# Patient Record
Sex: Male | Born: 1950 | Race: White | Hispanic: No | Smoking: Never smoker
Health system: Southern US, Community
[De-identification: ages and names within clinical notes are randomized; demographics above are authoritative.]

## PROBLEM LIST (undated history)

## (undated) DIAGNOSIS — K219 Gastro-esophageal reflux disease without esophagitis: Secondary | ICD-10-CM

## (undated) DIAGNOSIS — D649 Anemia, unspecified: Secondary | ICD-10-CM

## (undated) DIAGNOSIS — I10 Essential (primary) hypertension: Secondary | ICD-10-CM

## (undated) DIAGNOSIS — J3800 Paralysis of vocal cords and larynx, unspecified: Secondary | ICD-10-CM

## (undated) DIAGNOSIS — F1010 Alcohol abuse, uncomplicated: Secondary | ICD-10-CM

## (undated) DIAGNOSIS — G4730 Sleep apnea, unspecified: Secondary | ICD-10-CM

## (undated) DIAGNOSIS — Z8669 Personal history of other diseases of the nervous system and sense organs: Secondary | ICD-10-CM

## (undated) DIAGNOSIS — M1990 Unspecified osteoarthritis, unspecified site: Secondary | ICD-10-CM

## (undated) DIAGNOSIS — E785 Hyperlipidemia, unspecified: Secondary | ICD-10-CM

## (undated) DIAGNOSIS — K635 Polyp of colon: Secondary | ICD-10-CM

## (undated) DIAGNOSIS — K7030 Alcoholic cirrhosis of liver without ascites: Secondary | ICD-10-CM

## (undated) DIAGNOSIS — I509 Heart failure, unspecified: Secondary | ICD-10-CM

## (undated) DIAGNOSIS — K7460 Unspecified cirrhosis of liver: Secondary | ICD-10-CM

## (undated) DIAGNOSIS — I8500 Esophageal varices without bleeding: Secondary | ICD-10-CM

## (undated) HISTORY — PX: ROTATOR CUFF REPAIR: SHX139

## (undated) HISTORY — PX: SHOULDER OPEN ROTATOR CUFF REPAIR: SHX2407

## (undated) HISTORY — PX: APPENDECTOMY: SHX54

## (undated) HISTORY — PX: HERNIA REPAIR: SHX51

## (undated) HISTORY — DX: Paralysis of vocal cords and larynx, unspecified: J38.00

## (undated) HISTORY — DX: Hyperlipidemia, unspecified: E78.5

## (undated) HISTORY — DX: Anemia, unspecified: D64.9

## (undated) HISTORY — PX: UMBILICAL HERNIA REPAIR: SHX196

## (undated) HISTORY — PX: THROAT SURGERY: SHX803

## (undated) HISTORY — DX: Unspecified osteoarthritis, unspecified site: M19.90

## (undated) HISTORY — PX: UVULOPALATOPHARYNGOPLASTY: SHX827

---

## 1999-04-15 ENCOUNTER — Encounter: Payer: Self-pay | Admitting: Emergency Medicine

## 1999-04-15 ENCOUNTER — Emergency Department (HOSPITAL_COMMUNITY): Admission: EM | Admit: 1999-04-15 | Discharge: 1999-04-15 | Payer: Self-pay | Admitting: Emergency Medicine

## 1999-05-04 HISTORY — PX: UVULOPALATOPHARYNGOPLASTY: SHX827

## 1999-08-20 ENCOUNTER — Emergency Department (HOSPITAL_COMMUNITY): Admission: EM | Admit: 1999-08-20 | Discharge: 1999-08-20 | Payer: Self-pay | Admitting: Emergency Medicine

## 1999-08-20 ENCOUNTER — Encounter: Payer: Self-pay | Admitting: Emergency Medicine

## 1999-08-21 ENCOUNTER — Encounter: Payer: Self-pay | Admitting: Emergency Medicine

## 1999-09-04 ENCOUNTER — Ambulatory Visit (HOSPITAL_COMMUNITY): Admission: RE | Admit: 1999-09-04 | Discharge: 1999-09-04 | Payer: Self-pay | Admitting: Orthopedic Surgery

## 1999-09-04 ENCOUNTER — Encounter: Payer: Self-pay | Admitting: Orthopedic Surgery

## 1999-09-15 ENCOUNTER — Ambulatory Visit (HOSPITAL_BASED_OUTPATIENT_CLINIC_OR_DEPARTMENT_OTHER): Admission: RE | Admit: 1999-09-15 | Discharge: 1999-09-16 | Payer: Self-pay | Admitting: Orthopedic Surgery

## 1999-11-21 ENCOUNTER — Ambulatory Visit (HOSPITAL_BASED_OUTPATIENT_CLINIC_OR_DEPARTMENT_OTHER): Admission: RE | Admit: 1999-11-21 | Discharge: 1999-11-21 | Payer: Self-pay | Admitting: Otolaryngology

## 1999-12-01 ENCOUNTER — Ambulatory Visit (HOSPITAL_BASED_OUTPATIENT_CLINIC_OR_DEPARTMENT_OTHER): Admission: RE | Admit: 1999-12-01 | Discharge: 1999-12-02 | Payer: Self-pay | Admitting: Orthopedic Surgery

## 2000-01-21 ENCOUNTER — Encounter (INDEPENDENT_AMBULATORY_CARE_PROVIDER_SITE_OTHER): Payer: Self-pay | Admitting: Specialist

## 2000-01-21 ENCOUNTER — Ambulatory Visit (HOSPITAL_COMMUNITY): Admission: RE | Admit: 2000-01-21 | Discharge: 2000-01-22 | Payer: Self-pay | Admitting: Otolaryngology

## 2000-04-15 ENCOUNTER — Ambulatory Visit (HOSPITAL_BASED_OUTPATIENT_CLINIC_OR_DEPARTMENT_OTHER): Admission: RE | Admit: 2000-04-15 | Discharge: 2000-04-15 | Payer: Self-pay | Admitting: Otolaryngology

## 2000-11-09 ENCOUNTER — Encounter: Payer: Self-pay | Admitting: Orthopedic Surgery

## 2000-11-09 ENCOUNTER — Encounter: Admission: RE | Admit: 2000-11-09 | Discharge: 2000-11-09 | Payer: Self-pay | Admitting: Orthopedic Surgery

## 2003-02-15 ENCOUNTER — Inpatient Hospital Stay (HOSPITAL_COMMUNITY): Admission: EM | Admit: 2003-02-15 | Discharge: 2003-02-19 | Payer: Self-pay | Admitting: Emergency Medicine

## 2003-02-15 ENCOUNTER — Encounter: Payer: Self-pay | Admitting: Emergency Medicine

## 2003-02-16 ENCOUNTER — Encounter: Payer: Self-pay | Admitting: *Deleted

## 2003-02-18 ENCOUNTER — Encounter (INDEPENDENT_AMBULATORY_CARE_PROVIDER_SITE_OTHER): Payer: Self-pay | Admitting: *Deleted

## 2003-02-18 ENCOUNTER — Encounter: Payer: Self-pay | Admitting: *Deleted

## 2004-05-13 ENCOUNTER — Ambulatory Visit: Payer: Self-pay | Admitting: Gastroenterology

## 2004-05-20 ENCOUNTER — Ambulatory Visit: Payer: Self-pay | Admitting: Gastroenterology

## 2009-05-07 ENCOUNTER — Encounter (INDEPENDENT_AMBULATORY_CARE_PROVIDER_SITE_OTHER): Payer: Self-pay | Admitting: *Deleted

## 2009-12-02 ENCOUNTER — Telehealth: Payer: Self-pay | Admitting: Gastroenterology

## 2009-12-12 ENCOUNTER — Ambulatory Visit: Payer: Self-pay | Admitting: Cardiology

## 2009-12-12 ENCOUNTER — Inpatient Hospital Stay (HOSPITAL_COMMUNITY): Admission: EM | Admit: 2009-12-12 | Discharge: 2009-12-16 | Payer: Self-pay | Admitting: Emergency Medicine

## 2009-12-14 ENCOUNTER — Encounter: Payer: Self-pay | Admitting: Cardiology

## 2010-01-28 ENCOUNTER — Ambulatory Visit: Payer: Self-pay | Admitting: Cardiology

## 2010-06-04 NOTE — Letter (Signed)
Summary: Colonoscopy Letter  [facility]  [address] [place], [place] [address]   Phone: [phone]  Fax: [phone]      May 07, 2009 MRN: [id number]   [name] [name] [address] [place], [place]  [address]   Dear [name],   According to your medical record, it is time for you to schedule a Colonoscopy. The American Cancer Society recommends this procedure as a method to detect early colon cancer. Patients with a family history of colon cancer, or a personal history of colon polyps or inflammatory bowel disease are at increased risk.  This letter has beeen generated based on the recommendations made at the time of your procedure. If you feel that in your particular situation this may no longer apply, please contact our office.  Please call our office at [phone] to schedule this appointment or to update your records at your earliest convenience.  Thank you for cooperating with [place] to provide you with the very best care possible.   Sincerely,  Barbette Hair. Arlyce Dice, M.D.  [facility] [phone]

## 2010-06-04 NOTE — Progress Notes (Signed)
Summary: Schedule Colonoscopy  Phone Note Outgoing Call Call back at Home Phone [phone]   Call placed by: Harlow Mares CMA Duncan Dull),  December 02, 2009 4:56 PM Call placed to: Patient Summary of Call: Patients number is disconnected, we will mail them a letter to remind them they are due for their procedure and they need to call back and schedule.   Initial call taken by: Harlow Mares CMA (AAMA),  December 02, 2009 4:56 PM

## 2010-07-16 LAB — BASIC METABOLIC PANEL
BUN: 16 mg/dL (ref 6–23)
CO2: 29 mEq/L (ref 19–32)
Calcium: 9 mg/dL (ref 8.4–10.5)
Chloride: 99 mEq/L (ref 96–112)
Creatinine, Ser: 0.9 mg/dL (ref 0.4–1.5)
GFR calc non Af Amer: >60 mL/min (ref >60)
Potassium: 4 mEq/L (ref 3.5–5.1)

## 2010-07-16 LAB — CBC
HCT: 44.4 % (ref 39.0–52.0)
Hemoglobin: 15.1 g/dL (ref 13.0–17.0)
MCH: 33 pg (ref 26.0–34.0)
RBC: 4.58 MIL/uL (ref 4.22–5.81)
WBC: 8.4 10*3/uL (ref 4.0–10.5)

## 2010-07-17 LAB — CBC
HCT: 41.5 % (ref 39.0–52.0)
Hemoglobin: 14.1 g/dL (ref 13.0–17.0)
MCH: 32.5 pg (ref 26.0–34.0)
MCHC: 34.5 g/dL (ref 30.0–36.0)
Platelets: 274 10*3/uL (ref 150–400)
WBC: 10.5 10*3/uL (ref 4.0–10.5)
WBC: 7.5 10*3/uL (ref 4.0–10.5)

## 2010-07-17 LAB — URINALYSIS, ROUTINE W REFLEX MICROSCOPIC
Hgb urine dipstick: NEGATIVE
Ketones, ur: NEGATIVE mg/dL
Nitrite: NEGATIVE
Protein, ur: NEGATIVE mg/dL
Specific Gravity, Urine: 1.006 (ref 1.005–1.030)

## 2010-07-17 LAB — PROTIME-INR
INR: 0.86 (ref 0.00–1.49)
Prothrombin Time: 11.9 seconds (ref 11.6–15.2)

## 2010-07-17 LAB — COMPREHENSIVE METABOLIC PANEL
ALT: 61 U/L — ABNORMAL HIGH (ref 0–53)
ALT: 82 U/L — ABNORMAL HIGH (ref 0–53)
AST: 107 U/L — ABNORMAL HIGH (ref 0–37)
Albumin: 3.1 g/dL — ABNORMAL LOW (ref 3.5–5.2)
Alkaline Phosphatase: 84 U/L (ref 39–117)
BUN: 8 mg/dL (ref 6–23)
BUN: 8 mg/dL (ref 6–23)
CO2: 26 mEq/L (ref 19–32)
Calcium: 8.5 mg/dL (ref 8.4–10.5)
Calcium: 8.5 mg/dL (ref 8.4–10.5)
Chloride: 96 mEq/L (ref 96–112)
Chloride: 97 mEq/L (ref 96–112)
Creatinine, Ser: 0.74 mg/dL (ref 0.4–1.5)
Creatinine, Ser: 0.77 mg/dL (ref 0.4–1.5)
GFR calc Af Amer: >60 mL/min (ref >60)
GFR calc Af Amer: >60 mL/min (ref >60)
GFR calc non Af Amer: >60 mL/min (ref >60)
GFR calc non Af Amer: >60 mL/min (ref >60)
GFR calc non Af Amer: >60 mL/min (ref >60)
Glucose, Bld: 109 mg/dL — ABNORMAL HIGH (ref 70–99)
Potassium: 3.9 mEq/L (ref 3.5–5.1)
Sodium: 138 mEq/L (ref 135–145)
Total Bilirubin: 0.7 mg/dL (ref 0.3–1.2)
Total Bilirubin: 1.3 mg/dL — ABNORMAL HIGH (ref 0.3–1.2)

## 2010-07-17 LAB — BRAIN NATRIURETIC PEPTIDE: Pro B Natriuretic peptide (BNP): 448 pg/mL — ABNORMAL HIGH (ref 0.0–100.0)

## 2010-07-17 LAB — LIPID PANEL
HDL: 44 mg/dL (ref >39)
LDL Cholesterol: 169 mg/dL — ABNORMAL HIGH (ref 0–99)
Total CHOL/HDL Ratio: 5.9 RATIO

## 2010-07-17 LAB — DIFFERENTIAL
Basophils Relative: 1 % (ref 0–1)
Eosinophils Absolute: 0.3 10*3/uL (ref 0.0–0.7)
Eosinophils Relative: 4 % (ref 0–5)
Lymphs Abs: 1.5 10*3/uL (ref 0.7–4.0)
Monocytes Absolute: 0.6 10*3/uL (ref 0.1–1.0)
Monocytes Relative: 9 % (ref 3–12)
Neutro Abs: 5 10*3/uL (ref 1.7–7.7)
Neutrophils Relative %: 66 % (ref 43–77)

## 2010-07-17 LAB — HEMOGLOBIN A1C: Mean Plasma Glucose: 131 mg/dL — ABNORMAL HIGH (ref <117)

## 2010-07-17 LAB — MRSA PCR SCREENING: MRSA by PCR: NEGATIVE

## 2010-07-17 LAB — TSH: TSH: 2.903 u[IU]/mL (ref 0.350–4.500)

## 2010-07-17 LAB — CARDIAC PANEL(CRET KIN+CKTOT+MB+TROPI)
Total CK: 58 U/L (ref 7–232)
Troponin I: 0.05 ng/mL (ref 0.00–0.06)

## 2010-07-17 LAB — POCT CARDIAC MARKERS: Troponin i, poc: <0.05 ng/mL (ref 0.00–0.09)

## 2010-07-17 LAB — URIC ACID: Uric Acid, Serum: 5.3 mg/dL (ref 4.0–7.8)

## 2010-07-17 LAB — ETHANOL: Alcohol, Ethyl (B): 185 mg/dL — ABNORMAL HIGH (ref 0–10)

## 2010-08-14 ENCOUNTER — Emergency Department (HOSPITAL_COMMUNITY)
Admission: EM | Admit: 2010-08-14 | Discharge: 2010-08-14 | Disposition: A | Discharge status: Home / Self Care | Payer: 59 | Attending: Emergency Medicine | Admitting: Emergency Medicine

## 2010-08-14 DIAGNOSIS — IMO0002 Reserved for concepts with insufficient information to code with codable children: Secondary | ICD-10-CM | POA: Insufficient documentation

## 2010-08-14 DIAGNOSIS — I10 Essential (primary) hypertension: Secondary | ICD-10-CM | POA: Insufficient documentation

## 2010-08-14 DIAGNOSIS — S01502A Unspecified open wound of oral cavity, initial encounter: Secondary | ICD-10-CM | POA: Insufficient documentation

## 2010-09-18 NOTE — Op Note (Signed)
[place]  Patient:    [name] [name], [name] [name]                        MRN: [id number] Proc. Date: 01/21/00 Adm. Date:  [id number] Disc. Date: [id number] Attending:  Barbee Cough                           Operative Report  PREOPERATIVE DIAGNOSES: 1. Severe obstructive sleep apnea. 2. Nasal septal deviation. 3. Tonsillar hypertrophy. 4. Inferior nasal turbinate hypertrophy.  POSTOPERATIVE DIAGNOSES: 1. Severe obstructive sleep apnea. 2. Nasal septal deviation. 3. Tonsillar hypertrophy. 4. Inferior nasal turbinate hypertrophy.  INDICATIONS: 1. Severe obstructive sleep apnea. 2. Nasal septal deviation. 3. Tonsillar hypertrophy. 4. Inferior nasal turbinate hypertrophy.  SURGICAL PROCEDURE: 1. Uvulopalatopharyngoplasty. 2. Nasal septoplasty. 3. Tonsillectomy. 4. Inferior turbinate intramural cautery.  ANESTHESIA:  General endotracheal.  ESTIMATED BLOOD LOSS:  Less 100 cc.  COMPLICATIONS:  None.  Patient was transferred from the operating room to the recovery room in stable condition.  BRIEF HISTORY:  [name] is a 60 year old white male, who is referred for evaluation of snoring and obstructive sleep apnea.  Inpatient sleep study was performed at the [facility] and the patient was found to have an RDI of 80 events per hour with an O2 nadir of 74%.  The patient was tried on nasal CPAP and unfortunately secondary to chronic nasal obstruction and pressure sensation was unable to tolerate CPAP on an ongoing basis.  Given the patients history, examination and findings on flexible nasolaryngoscopy, I have recommended that we undertake surgical management of his obstructive sleep apnea.  Prior to surgery, informed consent was obtained from the patient and his wife for the above procedures, which include nasal septoplasty, uvulopalatopharyngoplasty, tonsillectomy and inferior turbinate intramural cautery.  PROCEDURE:  The  patient was brought to the operating room on January 21, 2000 and placed in a supine position on the operating table. General endotracheal anesthesia was established without difficulty.  When the patient had been adequately anesthetized, he was injected with 10 cc of 1% lidocaine, 1:100,000 dilution epinephrine injected in submucosal fashion along the nasal septum and inferior turbinates.  Patients nose was then packed with Afrin-soaked cottonoid pledgets, which were left in place for approximately 10 minutes to allow for vasoconstriction.  Patient was then prepped and draped in a sterile fashion and the surgical procedure was begun.  Nasal surgery consisted of nasal septoplasty and turbinate reduction and a right anterior hemitransfixion incision was created using a #15 scalpel blade, this was carried through the mucosa, underlying submucosa and a mucoperichondrial flap was elevated from anterior to posterior along the patients right hand side. The mucoperiosteal junction was identified and crossed at the midline and the mucoperiosteal flap was elevated along the patients left nasal septum.  With the mucosal flaps elevated, deviated bone and cartilage from the mid and posterior aspects of the septum were resected.  The patient had a large inferior septal spur along the left hand side, which was also resected using a 4 mm osteotome.  When septal cartilage had been removed and the nasal septum was midline, mucosal flaps were reapproximated using a 4-0 gut suture on a Keith needle in a horizontal mattressing fashion.  Bilateral Doyle nasal septal splints were placed after application of Bactroban ointment and these were sutured into position with a 3-0 Ethilon stitch.  Inferior turbinates were then cauterized with  cautery set at 12 watts.  Two passes were made in a submucosal fashion in each inferior turbinate.  When the turbinates were adequately cauterized they were out-fractured to create  a more patent nasal cavity.  Attention was then turned to the oral cavity and oropharynx.  A Crowe-Davis mouthgag was inserted without difficulty.  There were no loose or broken teeth and the hard and soft palate were intact.  Patients oral cavity was obstructed secondary to tonsillar hypertrophy and uvulopalatal hypertrophy.  Tonsillectomy was performed using the Harmonic scalpel dissecting in a subcapsular fashion and removing the entire left tonsil from superior pole to tongue base.  The right tonsil was removed in a similar fashion.  There was no significant bleeding.  Tonsillar fossae were inspected and gently abraded with a dry tonsil sponge and electrocautery was used to maintain hemostasis.  A uvulopalatopharyngoplasty was then performed estimating the posterior extent of the soft palate with ______ with the posterior pharyngeal wall.  A strip of tissue measuring approximately 1 to 1.5 cm was resected along the anterior tonsillar pillars, including uvulopalatal musculature and mucosa.  The incision was bevelled from anterior to posterior in order to create a posterior mucosal flap.  This procedure was performed using the Harmonic scalpel.  When the tissue had been resected, tonsil tissue was sent to pathology for gross and microscopic evaluation.  The posterior tonsillar pillars were left intact throughout the procedure and were reapproximated anteriorly using a 3-0 chromic suture on a taper needle in a horizontal mattressing fashion.  The entire uvulopalatopharyngoplasty incision was then closed in an interrupted fashion.  There was no active bleeding. Patients oral cavity and oropharynx were thoroughly irrigated and suctioned and an oral gastric tube was passed.  Patients nasal cavity and nasopharynx were irrigated, Crowe-Davis mouthgag was released and reapplied and again no bleeding was noted.  The mouthgag was removed.  Patient was then awakened from his anesthetic, extubated  without difficulty and was transferred from the operating room to the recovery room in stable condition. DD:  01/21/00 TD:  01/22/00 Job: [id number]

## 2010-09-18 NOTE — Discharge Summary (Signed)
NAMEJEFFREE, [name]                           ACCOUNT NO.:  [id number]   MEDICAL RECORD NO.:  [id number]                   PATIENT TYPE:  INP   LOCATION:  5511                                 FACILITY:  [facility]   PHYSICIAN:  Alfonse Spruce, M.D.               DATE OF BIRTH:  [birth year]   DATE OF ADMISSION:  02/15/2003  DATE OF DISCHARGE:  02/19/2003                                 DISCHARGE SUMMARY   FINAL DIAGNOSES:  1. Acute chest pain syndrome on admission associated with shortness of     breath and unstable angina.  2. Nonobstructive coronary artery disease.  3. Moderate left ventricular dysfunction with global hypokinesia, etiology     undetermined: Alcohol versus hypertension versus sleep apnea.  Consider     probably the hypertension will be the main cause at this time.  4. History of gastroesophageal reflux disease with hiatal hernia.  5. Anxiety.  6. Hypertension.  7. Cardiovascular  disease.  8. Hyperlipidemia.  9. Ejection fraction 35%  10.      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   DISCHARGE MEDICATIONS:  1. ASA, enteric coated, 325 mg daily.  2. Zocor 40 mg h.s.  3. Hydrochlorothiazide 12.5 mg daily.  4. Prilosec 20 mg daily.  5. Altace 5 mg b.i.d.  6. Lopressor 75 mg b.i.d.   ACTIVITY:  No lifting   DIET:  Low-fat, low-salt, low-cholesterol diet.   WOUND CARE:  Pressure on drainage site on right groin.   FOLLOW UP:  Follow up with Dr. Margrett Rud on November 2 at 10 a.m. and  also follow up with Dr. Antoine Poche November 1 at 3 p.m. for cardiology, and  follow up on November 30 at 1:30 p.m. for the right groin.  Repeat  ultrasound at Dr. Jenene Slicker office.   HOSPITAL COURSE:  The patient was initially admitted by Dr. Donald Siva,  [facility], and subsequently was admitted October 15.  Subsequently seen by myself on October 16 and 17 and 18.  At that time, the  patient had hypertensive cardiovascular disease that was  uncontrolled with  questionable underlying renal artery stenosis and decreased ejection  fraction by echocardiogram.  At that time, evaluation by cardiology by  cardiac catheterization was obtained which was done on 10 October and  followed by Dr. Angelena Sole subsequently in my absence.  On October 19, seen by  Dr. Angelena Sole and discharged home subsequently.  The patient was stable on  discharge.  He had ultrasound of the groin prior to discharge with the  underlying right groin fistula, covered with pressure bandage, and follow up  with Dr. Antoine Poche.  The patient was also seen by Dr. Olga Millers during  hospital stay wit the underlying congestive heart failure and dyspnea,  hypertension, hyperlipidemia, GERD, and sleep apnea.   Cardiac catheterization found that coronary artery left main is normal, LAD  proximal 40% stenosis, and  mid 25% stenosis; circumflex was large vessel and  dominant, and there was a mid marginal which appeared to have ostial 30%  stenosis.  Right coronary vessel was small and non-dominant.  The left  ventriculogram showed ejection fraction of 30% with global hypokinesis.  The  aortogram showed normal renal arteries and minimal diffuse distal aortic  plaquing.   The patient has been discharged home and will be followed closely by Dr.  Margrett Rud as well as Dr. Antoine Poche as an outpatient with the above  medications.                                                Alfonse Spruce, M.D.    Wynn Maudlin  D:  04/04/2003  T:  04/04/2003  Job:  [id number]
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
## 2010-09-18 NOTE — Discharge Summary (Signed)
[place]. [facility]  Patient:    [name] [name], [name] [name]                        MRN: [id number] Adm. Date:  [id number] Disc. Date: [id number] Attending:  Barbee Cough                           Discharge Summary  ADMISSION DIAGNOSES: 1. Severe obstructive sleep apnea. 2. Nasal septal deviation with obstruction. 3. Uvula palatal hypertrophy. 4. Tonsil hypertrophy.  DISCHARGE DIAGNOSES: 1. Severe obstructive sleep apnea. 2. Nasal septal deviation with obstruction. 3. Uvula palatal hypertrophy. 4. Tonsil hypertrophy.  SURGICAL PROCEDURES THIS ADMISSION DATED January 21, 2000: 1. Uvulopalatopharyngoplasty. 2. Nasal septoplasty. 3. Tonsillectomy. 4. Inferior turbinate intramural cautery.  DISPOSITION:  Patient is discharged to home in stable condition in the company of his family.  DISCHARGE MEDICATIONS:  Include his preoperative medication Prilosec 20 mg q.h.s. and discharge medications of Lortab elixir two to three teaspoons every four to six hours as needed for pain management 300 cc (dispense 300 cc), Cipro 500 mg p.o. b.i.d., and Vioxx 50 mg p.o. q.d.  ACTIVITY:  Limited.  No lifting, straining, or nose blowing.  DIET:  Clear liquids and soft diet as tolerated.  WOUND CARE:  Saline nasal spray four sprays per nostril every hour while awake.  FOLLOW-UP:  Patient will follow up in my office on January 28, 2000 as scheduled or sooner as warranted by problems or symptoms.  HISTORY OF PRESENT ILLNESS:  [name] is a 60 year old white male who was referred for evaluation of obstructive sleep apnea.  Patients sleep study performed at the [facility] showed a significant RDI of approximately 80 events per hour and an O2 nadir of 73%.  The patient was intolerant of nasal CPAP secondary to chronic nasal obstruction.  Given his history and findings, he was referred for surgical evaluation.  Given the patients above findings and examination using  diagnostic nasal laryngoscopy, I recommended we undertake surgical procedures including uvulopalatopharyngoplasty, nasal septoplasty, tonsillectomy, and turbinate cautery.  The risks, benefits, and possible complications of each of these surgical procedures was discussed in detail with the patient and his wife who understood and approved our plan for surgery.  Patient was admitted to [facility] for the above surgical procedure.  HOSPITAL COURSE:  Patient admitted on January 21, 2000 to [facility] and taken to the main operating room where he underwent uvulopalatopharyngoplasty, nasal septoplasty, tonsillectomy, and inferior turbinate cautery under general anesthesia.  The patients surgery was uncomplicated and uneventful and he was transferred from the operating room to recovery in stable condition in the recovery unit 2100 for postoperative monitoring.  Given the patients severe sleep apnea, continuous cardiac and pulse oximetry monitoring was performed in the first postoperative night.  The patient did well in the postoperative period.  His postoperative O2 nadir was 94% on humidified room air.  He was tolerating liquids, soft oral diet without difficulty, bowel and bladder function were normal, and the patient was ambulating without difficulty.  He was discharged to home with the above discharge instructions in stable condition and will follow up in my office as noted above. DD:  01/22/00 TD:  01/23/00 Job: [id number]

## 2010-09-18 NOTE — Cardiovascular Report (Signed)
[name] [name], [name] [name]                           ACCOUNT NO.:  [id number]   MEDICAL RECORD NO.:  [id number]                   PATIENT TYPE:  INP   LOCATION:  5511                                 FACILITY:  [facility]   PHYSICIAN:  Rollene Rotunda, M.D.                DATE OF BIRTH:  Jul 19, 1950   DATE OF PROCEDURE:  02/18/2003  DATE OF DISCHARGE:                              CARDIAC CATHETERIZATION   PROCEDURE:  1. Left and right heart catheterization.  2. Coronary arteriography.   INDICATIONS:  The patient with acute heart failure (428.21).   PROCEDURAL NOTE:  Left heart catheterization performed via the right femoral  artery.  Right heart catheterization performed via the right femoral vein.  Both vessels were cannulated using anterior wall puncture.  A #6-French  arterial sheath was inserted via the modified Seldinger technique.  Preformed Judkins and a pigtail catheter were utilized.  The patient  tolerated the procedure well and left the laboratory in stable condition.   RESULTS:  HEMODYNAMICS:  LV 179/9.  AO 179/101.  RA mean 12.  RV 35/10.  PA 34/15, mean 24.  Pulmonary capillary wedge pressure mean 21.  Cardiac output/cardiac index  (Fick) 3.5/1.72.   CORONARIES:  The left main was normal.   The LAD had proximal 40% stenosis and mid 25% stenosis.   The circumflex was a large vessel and dominant.  In the AV groove it was  normal.  There was a mid obtuse marginal which appeared to have an ostial  30% stenosis.  It was a large vessel.  The right coronary artery was small  and nondominant.   LEFT VENTRICULOGRAM:  The left ventriculogram was obtained in the RAO  projection.  EF of 30% with global hypokinesis.   AORTOGRAM:  A distal aortogram was obtained which demonstrated normal renal  arteries.  There was minimal diffuse distal aortic plaquing.   CONCLUSION:  1. Nonobstructive coronary artery disease.  2. Moderate left ventricular dysfunction with global hypokinesis.   The     etiology may be alcohol versus sleep apnea versus hypertension.    PLAN:  The patient will continue to have medical management.  We discussed  abstaining from alcohol.  I also think it is important for treatment of his  hypertension and his cardiomyopathy to continue to have aggressive attempts  to get him to wear his CPAP.   Stanley C. Andrey Campanile, M.D. not called secondary to this being a late case.                                               Rollene Rotunda, M.D.    JH/MEDQ  D:  02/18/2003  T:  02/19/2003  Job:  [id number]   cc:   Vale Haven. Andrey Campanile, M.D.  [address]  [address]  [place]  [place] [address]  Fax: [phone]

## 2010-09-18 NOTE — Discharge Summary (Signed)
NAMEZEUS, MARQUIS                           ACCOUNT NO.:  [id number]   MEDICAL RECORD NO.:  [id number]                   PATIENT TYPE:  INP   LOCATION:  5511                                 FACILITY:  [facility]   PHYSICIAN:  Alfonse Spruce, M.D.               DATE OF BIRTH:  1950-07-15   DATE OF ADMISSION:  02/15/2003  DATE OF DISCHARGE:  02/19/2003                                 DISCHARGE SUMMARY   ADDENDUM:  Patient was discharged on cardiac diet and activity to be no  lifting as mentioned above and with the diet as mentioned low cholesterol,  low salt, low fat.  Prognosis is fair to guarded.  Continue the above  medications to be adjusted per Dr. Margrett Rud the PCP and cardiology Dr.  Antoine Poche.                                                Alfonse Spruce, M.D.    Wynn Maudlin  D:  04/04/2003  T:  04/04/2003  Job:  [id number]
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
## 2010-09-18 NOTE — Consult Note (Signed)
[name] [name], [name] [name]                           ACCOUNT NO.:  [id number]   MEDICAL RECORD NO.:  [id number]                   PATIENT TYPE:  INP   LOCATION:  1827                                 FACILITY:  [facility]   PHYSICIAN:  Olga Millers, M.D.                DATE OF BIRTH:  June 23, 1950   DATE OF CONSULTATION:  02/15/2003  DATE OF DISCHARGE:                                   CONSULTATION   REFERRING PHYSICIAN:  Dr. Norwood Levo.   REASON FOR CONSULTATION:  [name] [name] is a 60 year old male with a past  medical history of sleep apnea, hypertension, hyperlipidemia, arthritis and  gastroesophageal reflux disease, whom I am ask to evaluate for dyspnea.  The  patient has no prior cardiac history.  He has had sleep apnea for  approximately seven to eight years.  Over the past several months, he has  noticed progressive dyspnea on exertion.  He initially noticed this with  more extreme activities.  However, now it is limiting his routine  activities.  There is no dyspnea at rest.  There is orthopnea and occasional  PND but there is no pedal edema.  He has had chest tightness at times, but  this increases with lying flat and improves with sitting up.  It is not  pleuritic, nor it is exertional.  There are no fevers, chills or productive  cough and there is no hemoptysis.  He was concerned about these symptoms and  came to the emergency room this evening for evaluation.  We were asked to  further evaluate after he was admitted by a primary care.   ALLERGIES:  He is allergic to ERYTHROMYCIN, CLINDAMYCIN, CEPHALOSPORINS and  TAPE.   MEDICATIONS:  His medications include history of Vioxx and Prilosec and  Cialis.   SOCIAL HISTORY:  He has a remote history of tobacco use but none in the past  eight years.  He does occasionally consume alcohol.   FAMILY HISTORY:  His family history is positive for coronary artery disease  in his father, who has had coronary artery bypassing graft.   PAST MEDICAL HISTORY:  His past medical history is significant for  hypertension and hyperlipidemia, but there is no diabetes mellitus.  He does  have a history of sleep apnea and has had surgery for that issue.  He has  had no prior cardiac history.  He has had prior rotator cuff surgery x2 on  the right and once on the left.  He has also had prior appendectomy and  tonsillectomy.  He does have sleep apnea and a history of gastroesophageal  reflux disease as well as arthritis.   REVIEW OF SYSTEMS:  He denies any headaches or fevers or chills.  There is  no productive cough or hemoptysis.  There is no dysphagia, odynophagia,  melena or hematochezia.  There is no dysuria or hematuria.  There is no  rash  or seizure activity.  There is orthopnea and PND but there is no pedal  edema.  There is no claudication noted.   PHYSICAL EXAMINATION:  VITAL SIGNS:  His physical exam today shows a blood  pressure of 141/93 and his pulse is 85.  He is afebrile.  GENERAL:  He is well-developed and obese.  He is in no acute distress.  He  does not appear to be depressed.  There is no peripheral clubbing.  HEENT:  His HEENT are unremarkable with normal eyelids.  NECK:  His neck is supple with normal upstroke bilaterally and there are no  bruits noted.  There is no jugular venous distention and no thyromegaly  noted.  CHEST:  His chest is clear to auscultation with normal expansion.  CARDIOVASCULAR:  Exam shows a regular rate and rhythm with a normal S1 and  S2.  There are no murmurs, rubs, or gallops noted.  ABDOMEN:  Not tender or distended.  Positive bowel sounds.  No  hepatosplenomegaly and no masses appreciated.  There is no abdominal bruit.  He has 2+ femoral pulses bilaterally with no bruits.  EXTREMITIES:  His extremities show no edema and I can palpate no cords.  He  has 2+ dorsalis pedis pulses bilaterally.  NEUROLOGICAL:  Exam is grossly intact.   LABORATORY AND ACCESSORY CLINICAL DATA:  His  electrocardiogram shows a  normal sinus rhythm at a rate of 76.  The axis is normal.  There are  nonspecific ST changes.   His chest x-ray shows no acute disease.   His ABG on room air shows a pH of 7.34, PCO2 of 39 and a PO2 of 77.  His  bicarbonate is 21.  Hemoglobin and hematocrit are 14.3 and 41.8,  respectively.  BUN and creatinine are 18 and 0.8.  Enzymes are negative x1.   DIAGNOSES:  1. Dyspnea on exertion.  2. Hypertension.  3. Hyperlipidemia.  4. Gastroesophageal reflux disease.  5. Sleep apnea.   PLAN:  Mr. Errington presents for evaluation of dyspnea of unclear etiology.  His symptoms do sound consistent with congestive heart failure, although he  is not volume-overloaded on exam.  His dyspnea may also be related to  progressive sleep apnea and obesity-hypoventilation syndrome.  I agree with  cycling enzymes.  If they are negative, then we will plan to proceed with a  stress Cardiolite tomorrow morning.  If his LV function is diminished or  there is significant infarct or ischemia, then he will need a cardiac  catheterization.  We will also check a BNP as well as a D-dimer.  We would  also like an echocardiogram, although we may need to proceed as an  outpatient as an echocardiogram may not be available tomorrow morning.  We  will make further recommendations, once we have the above information.                                               Olga Millers, M.D.    BC/MEDQ  D:  02/15/2003  T:  02/16/2003  Job:  [id number]

## 2010-09-18 NOTE — H&P (Signed)
[name] [name], [name] [name]                           ACCOUNT NO.:  [id number]   MEDICAL RECORD NO.:  [id number]                   PATIENT TYPE:  EMS   LOCATION:  MAJO                                 FACILITY:  [facility]   PHYSICIAN:  Norwood Levo, MD               DATE OF BIRTH:  1950/06/05   DATE OF ADMISSION:  02/15/2003  DATE OF DISCHARGE:                                HISTORY & PHYSICAL   PRIMARY CARE PHYSICIAN:  Duffy Rhody C. Andrey Campanile, M.D.   ADMITTING ATTENDING:  Norwood Levo, MD   CHIEF COMPLAINT:  Intermittent chest pain, shortness of breath, and  generalized malaise.   HISTORY OF PRESENT ILLNESS:  A 60 year old white male with past medical  history for GERD, also arthritis and obstructive sleep apnea.  Presents with  gradual increasing of DOE with intermittent chest pressure with radiation to  the left neck over the last 2-3 months.  This has worsened over the last 2-3  days, and the patient today had a similar episode with generalized weakness  and some anxiety.  Some transient palpitations and dizziness.  The patient  states that these symptom are very similar to what he had had initially  before the diagnosis of OSA, and uvuloplasty and palatoplasty in the past.  He is not a candidate for CPAP and does not use that at this time.  He  states that there is a strong family history for cardiac disease.  Had  previously been worked up by [employer] cardiologist with a negative  treadmill stress test and had had no prior episode of this type.  He states  that at the worst episode, that he has had approximately six such episodes  over the last three months, he would develop sudden onset of shortness of  breath, triggered usually by dyspnea on exertion with substernal chest pain  lasting less than 1-2 minutes at times with radiation to the left shoulder  and at times radiation to the left neck.  Also, the patient has had  intermittent torticollis of the left neck and has recently  undergone surgery  of the right shoulder for rotator cuff tear.  He has had such operations on  both shoulders.  Denies any type of nausea and vomiting but has had some  associated diaphoresis, short-lived, with some of his episodes.  States that  he cannot specifically state any triggering mechanism to these.  They are  certainly not diet-related.  The patient denies any recent fevers, chills,  nausea, vomiting, cough, GU or GI symptomatology.  Denies any recent travel.  Denies any prior history of pulmonary embolus or DVT.  He has gained a  considerable amount of weigh over the years and was until recently a user of  Vioxx which he had used for approximately two years.   PAST MEDICAL HISTORY:  1. Osteoarthritis.  2. Erectile dysfunction.  3. GERD with hiatal  hernia.  4. OSA.  5. Bilateral rotator cuff tears.   PAST SURGICAL HISTORY:  1. Palatoplasty.  2. Rotator cuff surgery x 2, most recently this last month on the right.  3. Status post appendectomy.   ALLERGIES:  1. LATEX.  2. CLINDAMYCIN.  3. CEPHALOSPORINS.  4. NEOMYCIN.   MEDICATIONS ON ADMISSION:  Status post Vioxx, Cialis, and Prilosec 20 mg  p.o. daily.   FAMILY HISTORY:  Mother morbidly obese with adult-onset diabetes.  Father  six-vessel bypass with morbid CAD, hypertension.  Has had multiple numbers  of the paternal side of the family with CAD and MI with one cardiac arrest  at the age of 71 in one of those male members.  Siblings are without any  medical history at this time.   REVIEW OF SYSTEMS:  Dyspnea on exertion, left neck pain, insomnia, OSA,  chest pain, and dyspepsia.  As per HPI.  All others negative.   PHYSICAL EXAMINATION ON ADMISSION:  VITAL SIGNS:  Temperature 97.8, P 85, R  20, BP 141/93, POX 97 RA.  GENERAL:  NAD, no CP/SOB.  Nonicteric sclerae.  HEENT:  PERRLA, EOMI.  Status post uvuloplasty.  Very restricted  retropharyngeal area.  NECK:  Supple.  No JVD, no BRT.  CHEST:  CTAB.  HEART:   RRR, S1, S2, no MRG.  ABDOMEN:  Soft, obese, positive bowel sounds, ND/NT.  No HSM.  Retractable  umbilical hernia.  EXTREMITIES:  Trace pretibial edema.  2+ DP and PT pulses bilaterally.  SKIN:  Intact.  NEUROLOGIC:  AO x 3, CN 2-12 grossly intact.   LABORATORY DATA AND DIAGNOSTICS:  On admission, EKG:  LVH, LAD, IVCD, and  flipped T in aVL.  A chest x-ray negative, slightly enlarged heart.  No  aortic calcific arch.  WBC 6.6, HCT 41.8, PLT 253.  Myoglobulin 30. MB 3,  troponin I less than 0.05.  CMET is pending at this time.   ASSESSMENT AND PLAN:  1. Cardiovascular:  Presumptive unstable angina.  Positive cardiac risk     factors with family history obesity, male sex, vascular disease with     accompanying ED.  And DDX with gastroesophageal reflux disease/hiatal     hernia.  I will admit patient to telemetry.  Cardiac enzymes serially.     Check homocystine, BNP, CRP, lipid profile, CMET, CBC in the a.m., EKG in     the a.m.  Prophylactic aspirin 325, nitroglycerin paste 1 inch a.c. w.     q.6h., metoprolol 12.5 q.12h., Lovenox 60 mg subcu q.12h., morphine 2-4     mg IV q.1-2h. p.r.n.  Will set up for Cardiolite in the a.m.  We will     consult [facility] for cardiac consultation.  Cardiac II diet.     Patient will also receive D5 half-normal saline with potassium 60 mL/hr x     1 liter.  2. Pulmonary:  Obstructive sleep apnea, status post surgical resection of     uvula and palatine area and sinus surgery.  Not a CPAP tolerant patient.     We will give nasal cannula O2 empirically, and patient may require, after     cardiac evaluation, a follow-up with pulmonologist, Dr. Annalee Genta.  3. Gastrointestinal:  Gastroesophageal reflux disease/hiatal hernia.  No     dyspepsia at this time.  We will continue     with Protonix p.o. daily.  4. Prophylaxis:  Levaquin, Ambien, Ativan, morphine, and nasal oxygen.   STATUS CODE:  Full.  Norwood Levo, MD    APM/MEDQ  D:  02/15/2003  T:  02/15/2003  Job:  [id number]   cc:   Vale Haven. Andrey Campanile, M.D.  [address]  [place]  [place] [address]  Fax: [phone]

## 2011-03-16 ENCOUNTER — Other Ambulatory Visit: Payer: Self-pay | Admitting: Family Medicine

## 2011-03-16 DIAGNOSIS — M545 Low back pain: Secondary | ICD-10-CM

## 2011-03-19 ENCOUNTER — Ambulatory Visit
Admission: RE | Admit: 2011-03-19 | Discharge: 2011-03-19 | Disposition: A | Discharge status: Home / Self Care | Payer: 59 | Source: Ambulatory Visit | Attending: Family Medicine | Admitting: Family Medicine

## 2011-03-19 DIAGNOSIS — M545 Low back pain: Secondary | ICD-10-CM

## 2011-05-04 HISTORY — PX: UPPER GI ENDOSCOPY: SHX6162

## 2011-05-04 HISTORY — PX: COLONOSCOPY: SHX174

## 2011-07-21 ENCOUNTER — Other Ambulatory Visit: Payer: Self-pay

## 2011-07-21 ENCOUNTER — Emergency Department (HOSPITAL_COMMUNITY): Payer: 59

## 2011-07-21 ENCOUNTER — Encounter (HOSPITAL_COMMUNITY): Payer: Self-pay | Admitting: Adult Health

## 2011-07-21 ENCOUNTER — Inpatient Hospital Stay (HOSPITAL_COMMUNITY)
Admission: EM | Admit: 2011-07-21 | Discharge: 2011-07-29 | DRG: 897 | Disposition: A | Discharge status: Skilled Nursing Facility | Payer: 59 | Attending: Internal Medicine | Admitting: Internal Medicine

## 2011-07-21 DIAGNOSIS — F10239 Alcohol dependence with withdrawal, unspecified: Secondary | ICD-10-CM | POA: Diagnosis present

## 2011-07-21 DIAGNOSIS — F10231 Alcohol dependence with withdrawal delirium: Principal | ICD-10-CM | POA: Diagnosis present

## 2011-07-21 DIAGNOSIS — I5020 Unspecified systolic (congestive) heart failure: Secondary | ICD-10-CM | POA: Diagnosis present

## 2011-07-21 DIAGNOSIS — D638 Anemia in other chronic diseases classified elsewhere: Secondary | ICD-10-CM | POA: Diagnosis present

## 2011-07-21 DIAGNOSIS — R195 Other fecal abnormalities: Secondary | ICD-10-CM | POA: Diagnosis present

## 2011-07-21 DIAGNOSIS — R7989 Other specified abnormal findings of blood chemistry: Secondary | ICD-10-CM | POA: Diagnosis present

## 2011-07-21 DIAGNOSIS — E440 Moderate protein-calorie malnutrition: Secondary | ICD-10-CM | POA: Diagnosis present

## 2011-07-21 DIAGNOSIS — D649 Anemia, unspecified: Secondary | ICD-10-CM | POA: Diagnosis present

## 2011-07-21 DIAGNOSIS — R509 Fever, unspecified: Secondary | ICD-10-CM | POA: Diagnosis not present

## 2011-07-21 DIAGNOSIS — E871 Hypo-osmolality and hyponatremia: Secondary | ICD-10-CM | POA: Diagnosis present

## 2011-07-21 DIAGNOSIS — R29898 Other symptoms and signs involving the musculoskeletal system: Secondary | ICD-10-CM | POA: Diagnosis present

## 2011-07-21 DIAGNOSIS — I5032 Chronic diastolic (congestive) heart failure: Secondary | ICD-10-CM | POA: Diagnosis present

## 2011-07-21 DIAGNOSIS — F1010 Alcohol abuse, uncomplicated: Secondary | ICD-10-CM | POA: Diagnosis present

## 2011-07-21 DIAGNOSIS — R5381 Other malaise: Secondary | ICD-10-CM | POA: Diagnosis present

## 2011-07-21 DIAGNOSIS — I5022 Chronic systolic (congestive) heart failure: Secondary | ICD-10-CM | POA: Diagnosis present

## 2011-07-21 DIAGNOSIS — F1020 Alcohol dependence, uncomplicated: Secondary | ICD-10-CM | POA: Diagnosis present

## 2011-07-21 DIAGNOSIS — F10931 Alcohol use, unspecified with withdrawal delirium: Principal | ICD-10-CM | POA: Diagnosis present

## 2011-07-21 DIAGNOSIS — R945 Abnormal results of liver function studies: Secondary | ICD-10-CM

## 2011-07-21 DIAGNOSIS — K709 Alcoholic liver disease, unspecified: Secondary | ICD-10-CM | POA: Diagnosis present

## 2011-07-21 DIAGNOSIS — F10939 Alcohol use, unspecified with withdrawal, unspecified: Secondary | ICD-10-CM | POA: Diagnosis present

## 2011-07-21 DIAGNOSIS — I509 Heart failure, unspecified: Secondary | ICD-10-CM | POA: Diagnosis present

## 2011-07-21 HISTORY — DX: Essential (primary) hypertension: I10

## 2011-07-21 HISTORY — DX: Gastro-esophageal reflux disease without esophagitis: K21.9

## 2011-07-21 HISTORY — DX: Heart failure, unspecified: I50.9

## 2011-07-21 HISTORY — DX: Alcohol abuse, uncomplicated: F10.10

## 2011-07-21 LAB — COMPREHENSIVE METABOLIC PANEL
ALT: 17 U/L (ref 0–53)
AST: 72 U/L — ABNORMAL HIGH (ref 0–37)
Albumin: 2.3 g/dL — ABNORMAL LOW (ref 3.5–5.2)
CO2: 25 mEq/L (ref 19–32)
CO2: 24 mEq/L (ref 19–32)
Calcium: 8.2 mg/dL — ABNORMAL LOW (ref 8.4–10.5)
Calcium: 7.7 mg/dL — ABNORMAL LOW (ref 8.4–10.5)
Creatinine, Ser: 0.93 mg/dL (ref 0.50–1.35)
Creatinine, Ser: 0.9 mg/dL (ref 0.50–1.35)
GFR calc Af Amer: >90 mL/min (ref >90)
GFR calc non Af Amer: 89 mL/min — ABNORMAL LOW (ref >90)
GFR calc non Af Amer: >90 mL/min (ref >90)
Glucose, Bld: 95 mg/dL (ref 70–99)
Sodium: 125 mEq/L — ABNORMAL LOW (ref 135–145)
Total Protein: 7.6 g/dL (ref 6.0–8.3)

## 2011-07-21 LAB — ACETAMINOPHEN LEVEL: Acetaminophen (Tylenol), Serum: <15 ug/mL (ref 10–30)

## 2011-07-21 LAB — PROTIME-INR: Prothrombin Time: 14.8 seconds (ref 11.6–15.2)

## 2011-07-21 LAB — DIFFERENTIAL
Basophils Absolute: 0 10*3/uL (ref 0.0–0.1)
Eosinophils Relative: 1 % (ref 0–5)
Lymphocytes Relative: 12 % (ref 12–46)
Neutro Abs: 6.1 10*3/uL (ref 1.7–7.7)
Neutrophils Relative %: 79 % — ABNORMAL HIGH (ref 43–77)

## 2011-07-21 LAB — CBC
MCV: 95.5 fL (ref 78.0–100.0)
Platelets: 264 10*3/uL (ref 150–400)
Platelets: 265 10*3/uL (ref 150–400)
RBC: 2.47 MIL/uL — ABNORMAL LOW (ref 4.22–5.81)
RDW: 16.1 % — ABNORMAL HIGH (ref 11.5–15.5)
WBC: 9.2 10*3/uL (ref 4.0–10.5)
WBC: 7.7 10*3/uL (ref 4.0–10.5)

## 2011-07-21 LAB — PHOSPHORUS: Phosphorus: 3.8 mg/dL (ref 2.3–4.6)

## 2011-07-21 LAB — CARDIAC PANEL(CRET KIN+CKTOT+MB+TROPI): Relative Index: INVALID (ref 0.0–2.5)

## 2011-07-21 LAB — MAGNESIUM: Magnesium: 1.1 mg/dL — ABNORMAL LOW (ref 1.5–2.5)

## 2011-07-21 LAB — PRO B NATRIURETIC PEPTIDE: Pro B Natriuretic peptide (BNP): 256.7 pg/mL — ABNORMAL HIGH (ref 0–125)

## 2011-07-21 LAB — APTT: aPTT: 41 seconds — ABNORMAL HIGH (ref 24–37)

## 2011-07-21 MED ORDER — POTASSIUM CHLORIDE 10 MEQ/100ML IV SOLN
10.0000 meq | INTRAVENOUS | Status: AC
  Administered 2011-07-22 (×2): 10 meq via INTRAVENOUS
  Filled 2011-07-21 (×2): qty 100

## 2011-07-21 MED ORDER — SODIUM CHLORIDE 0.9 % IV SOLN
INTRAVENOUS | Status: DC
  Administered 2011-07-21: 21:00:00 via INTRAVENOUS

## 2011-07-21 MED ORDER — FUROSEMIDE 10 MG/ML IJ SOLN
40.0000 mg | Freq: Two times a day (BID) | INTRAMUSCULAR | Status: DC
  Administered 2011-07-21 – 2011-07-24 (×6): 40 mg via INTRAVENOUS
  Filled 2011-07-21 (×8): qty 4

## 2011-07-21 MED ORDER — MEGESTROL ACETATE 40 MG/ML PO SUSP
800.0000 mg | Freq: Every day | ORAL | Status: DC
  Administered 2011-07-22 – 2011-07-29 (×8): 800 mg via ORAL
  Filled 2011-07-21 (×8): qty 20

## 2011-07-21 MED ORDER — FUROSEMIDE 40 MG PO TABS
40.0000 mg | ORAL_TABLET | Freq: Every day | ORAL | Status: DC
  Filled 2011-07-21: qty 1

## 2011-07-21 MED ORDER — POTASSIUM CHLORIDE 20 MEQ/15ML (10%) PO LIQD
20.0000 meq | Freq: Two times a day (BID) | ORAL | Status: DC
  Administered 2011-07-21 – 2011-07-26 (×10): 20 meq via ORAL
  Filled 2011-07-21 (×13): qty 15

## 2011-07-21 MED ORDER — SODIUM CHLORIDE 0.9 % IV SOLN
INTRAVENOUS | Status: DC
  Administered 2011-07-21: 18:00:00 via INTRAVENOUS

## 2011-07-21 NOTE — ED Notes (Addendum)
Per ems: pt's family called ems. Pt is jaundice. Pt is alert and orient. Pt states he wants detox. Pt denies si/hi. Pt is stable at this time. Per pt he has been unable to walk for the past 2 days and last drink was 3 days ago.

## 2011-07-21 NOTE — Progress Notes (Addendum)
Patient ID: [name] [name], male   DOB: [birth year], 61 y.o.   MRN: [id number]  PCP:  Kaleen Mask, MD, MD   DOA:  07/21/2011  4:02 PM  Chief Complaint:  Leg weakness  HPI: Pt is 61 year old male with history of chronic systolic CHF (EF in 2011 20-25%), HTN, alcohol abuse (last drink few days prior to admission, vodka) who presented to ED with complaints of lower extremity weakness of 5 weeks duration. As per patient this weakness has progressively gotten worse to a point where he could not ambulate at all.  In addition he reports lower extremity swelling of several weeks in duration. Patient reports being compliant with medications which includes but is not limited to lasix. Patient denies symptoms of shortness of breath or chest pain, no palpitations. No fever or chills, no lightheadedness or dizziness, loss of consciousness. No complaints of abdominal pain, blood in stool or urine. No nusea or vomiting.   Assessment/Plan:  Principal Problem:   *LOWER EXTREMITY WEAKNESS - unclear etiology, perhaps secondary to alcohol abuse, questionable vit B12 deficiency - alcohol level < 15 - we will order CIWA protocol just in case pt has symptoms of withdrawals; however at present he does not appear to be acutely intoxicated or acutely withdrawing - follow up urine drug screen - PT evaluation  Active Problems:  HYPONATREMIA - secondary to dehydration or alcohol abuse versus chronic CHF versus diuretic use - patient is fluid overloaded with (+3) LE extremity edema - start lasix 40 mg IV Q 12 hours - reassess clinical status in AM - follow up BMP in am  CHF, SYSTOLIC, CHRONIC - based on 2 D ECHO in 2011, severely reduced EF 20-25% - obtain 2 D ECHO on this admission - cycle cardiac enzymes, check TSH, Hgb A1c, lipid panel - check BNP level - lasix 40 mg Q 12 hours IV - daily weight - strict I&Os - correct electrolytes  ABNORMAL LIVER FUNCTION TESTS - likely secondary to alcohol /  cholestasis - obtain liver ultrasound - trend LFTs   ANEMIA - possibly related to chronic disease/ bone marrow suppression - check iron profile, ferritin, B12 - Hgb dropped from 8 to 7.2 - check FOBT - hold pradaxa - transfuse 1 unit PRBC now - CBC post transfusion  MODERATE PROTEIN CALORIE MALNUTRITION - nutrition consult  DIET - heart healthy  DVT PROPHYLAXIS - SCD bilaterally  DISPOSITION - to telemetry floor   Allergies: Allergies  Allergen Reactions  . Other Hives    "About 3/4 of all antibiotics." Does not remember which ones he can take.    Prior to Admission medications   Medication Sig Start Date End Date Taking? Authorizing Provider  Dabigatran Etexilate Mesylate (PRADAXA PO) Take by mouth.   Yes Historical Provider, MD  furosemide (LASIX) 40 MG tablet Take 40 mg by mouth daily.   Yes Historical Provider, MD  megestrol (MEGACE) 40 MG/ML suspension Take 800 mg by mouth daily. Take 4 teaspoons (70ml)by mouth once daily.   Yes Historical Provider, MD  potassium chloride 20 MEQ/15ML (10%) solution Take 20 mEq by mouth 2 (two) times daily.   Yes Historical Provider, MD    Past Medical History  Diagnosis Date  . CHF (congestive heart failure)   . Hypertension   . GERD (gastroesophageal reflux disease)     Past Surgical History  Procedure Date  . Appendectomy   . Throat surgery     surgery to help with snoring  . Shoulder open  rotator cuff repair     2 on right shoulder, 1 on left shoulder    Social History:  reports that he quit smoking about 28 years ago. He has never used smokeless tobacco. He reports that he drinks alcohol. He reports that he does not use illicit drugs.  History reviewed. No pertinent family history.  Review of Systems:  Constitutional: Denies fever, chills, diaphoresis, appetite change and fatigue.  HEENT: Denies photophobia, eye pain, redness, hearing loss, ear pain, congestion, sore throat, rhinorrhea, sneezing, mouth sores,  trouble swallowing, neck pain, neck stiffness and tinnitus.   Respiratory: occasional SOB, no DOE, no cough, no chest tightness,  and wheezing.   Cardiovascular: Denies chest pain, palpitations and leg swelling.  Gastrointestinal: Denies nausea, vomiting, abdominal pain, diarrhea, constipation, blood in stool and abdominal distention.  Genitourinary: Denies dysuria, urgency, frequency, hematuria, flank pain and difficulty urinating.  Musculoskeletal: Denies myalgias, back pain, joint swelling, arthralgias and gait problem.  Skin: Denies pallor, rash and wound.  Neurological: Denies dizziness, seizures, syncope, light-headedness, numbness and headaches; does report lower extremity weakness.  Hematological: Denies adenopathy. Easy bruising, personal or family bleeding history  Psychiatric/Behavioral: Denies suicidal ideation, mood changes, confusion, nervousness, sleep disturbance and agitation   Physical Exam:  Filed Vitals:   07/21/11 1439 07/21/11 1605 07/21/11 1917  BP: 100/79 104/72 117/75  Pulse:  104 97  Temp:  98 F (36.7 C)   TempSrc:  Oral   Resp: 16 16   SpO2: 95% 98% 99%    Constitutional: Vital signs reviewed.  Patient is in no acute distress and cooperative with exam. Alert and oriented x3.  Head: Normocephalic and atraumatic Ear: TM normal bilaterally Mouth: no erythema or exudates, dry MM Eyes: PERRL, EOMI, conjunctivae normal, No scleral icterus; pale skin.  Neck: Supple, Trachea midline normal ROM, (+) JVD, no mass, thyromegaly, or carotid bruit present.  Cardiovascular: RRR, S1 normal, S2 normal, no MRG, pulses symmetric and intact bilaterally Pulmonary/Chest: CTAB, no wheezes, rales, or rhonchi; diminished breath sounds bilaterally Abdominal: Soft. Non-tender, non-distended, bowel sounds are normal, no masses, organomegaly, or guarding present.  GU: no CVA tenderness Musculoskeletal: No joint deformities, erythema, or stiffness, ROM full and no nontender Ext:  (+3) lower extremity edema; pulses palpable bilaterally, no rash and no cyanosis Hematology: no cervical, inginal, or axillary adenopathy.  Neurological: A&O x3, Strenght is normal and symmetric bilaterally, cranial nerve II-XII are grossly intact, no focal motor deficit, sensory intact to light touch bilaterally.  Skin: Warm, dry and intact. No rash, cyanosis, or clubbing.  Psychiatric: Normal mood and affect. speech and behavior is normal. Judgment and thought content normal. Cognition and memory are normal.   Labs on Admission:  Results for orders placed during the hospital encounter of 07/21/11 (from the past 48 hour(s))  CBC     Status: Abnormal   Collection Time   07/21/11  5:23 PM      Component Value Range Comment   WBC 9.2  4.0 - 10.5 (K/uL)    RBC 2.47 (*) 4.22 - 5.81 (MIL/uL)    Hemoglobin 8.0 (*) 13.0 - 17.0 (g/dL)    HCT 45.4 (*) 09.8 - 52.0 (%)    MCV 95.5  78.0 - 100.0 (fL)    MCH 32.4  26.0 - 34.0 (pg)    MCHC 33.9  30.0 - 36.0 (g/dL)    RDW 11.9 (*) 14.7 - 15.5 (%)    Platelets 264  150 - 400 (K/uL)   COMPREHENSIVE METABOLIC PANEL  Status: Abnormal   Collection Time   07/21/11  5:23 PM      Component Value Range Comment   Sodium 125 (*) 135 - 145 (mEq/L)    Potassium 3.7  3.5 - 5.1 (mEq/L)    Chloride 86 (*) 96 - 112 (mEq/L)    CO2 25  19 - 32 (mEq/L)    Glucose, Bld 115 (*) 70 - 99 (mg/dL)    BUN 15  6 - 23 (mg/dL)    Creatinine, Ser 1.61  0.50 - 1.35 (mg/dL)    Calcium 8.2 (*) 8.4 - 10.5 (mg/dL)    Total Protein 7.6  6.0 - 8.3 (g/dL)    Albumin 2.3 (*) 3.5 - 5.2 (g/dL)    AST 72 (*) 0 - 37 (U/L)    ALT 17  0 - 53 (U/L)    Alkaline Phosphatase 249 (*) 39 - 117 (U/L)    Total Bilirubin 2.1 (*) 0.3 - 1.2 (mg/dL)    GFR calc non Af Amer 89 (*) >90 (mL/min)    GFR calc Af Amer >90  >90 (mL/min)   ETHANOL     Status: Normal   Collection Time   07/21/11  5:23 PM      Component Value Range Comment   Alcohol, Ethyl (B) <11  0 - 11 (mg/dL)   ACETAMINOPHEN LEVEL      Status: Normal   Collection Time   07/21/11  5:23 PM      Component Value Range Comment   Acetaminophen (Tylenol), Serum <15.0  10 - 30 (ug/mL)      Time Spent on Admission: Greater than 30 minutes  Tavish Gettis 07/21/2011, 8:22 PM  [facility] [phone]

## 2011-07-21 NOTE — ED Notes (Signed)
Patient transported to Ultrasound 

## 2011-07-21 NOTE — ED Notes (Signed)
Blood bank states blood will be ready in 30 min

## 2011-07-21 NOTE — ED Notes (Signed)
Pt.reports weakness and inability to ambulate worsening over the last few months.  Pt. Not drinking fluids well.  Pt. Has been drinking all his life but started drinking vodka 2 years ago.  Lower extremity swelling.  Pt. Alert and oriented.

## 2011-07-21 NOTE — ED Provider Notes (Addendum)
History     CSN: [id number]  Arrival date & time 07/21/11  1444   First MD Initiated Contact with Patient 07/21/11 1640      Chief Complaint  Patient presents with  . Weakness  . Jaundice    (Consider location/radiation/quality/duration/timing/severity/associated sxs/prior treatment) Patient is a 61 y.o. male presenting with weakness. The history is provided by the patient.  Weakness Primary symptoms do not include headaches, fever, nausea or vomiting.  Additional symptoms include weakness.   the patient is a 61 year old, male, who admits that he is an alcoholic who presents emergency department complaining of weakness in his lower extremity, with swelling.  He says that he is no longer able to walk because of weakness in his legs..He states that he drinks up to a gallon of vodka today.  He has not had any alcohol for the last 3 days. he denies pain anywhere.  He denies nausea, vomiting, fevers, chills, cough, or shortness of breath.  He has not had diarrhea.  He denies a history of DTs .  He denies a history of passing out or seizures.  Related to his alcohol use.  He states that he wants to get off the alcohol   Past Medical History  Diagnosis Date  . CHF (congestive heart failure)   . Hypertension     History reviewed. No pertinent past surgical history.  History reviewed. No pertinent family history.  History  Substance Use Topics  . Smoking status: Never Smoker   . Smokeless tobacco: Not on file  . Alcohol Use: Yes      Review of Systems  Constitutional: Negative for fever and chills.  HENT: Negative for nosebleeds.   Respiratory: Negative for cough and shortness of breath.   Cardiovascular: Positive for leg swelling. Negative for chest pain.  Gastrointestinal: Negative for nausea, vomiting, abdominal pain, diarrhea and blood in stool.  Genitourinary: Negative for hematuria.  Neurological: Positive for weakness. Negative for headaches.  Psychiatric/Behavioral:  Negative for confusion.  All other systems reviewed and are negative.    Allergies  Review of patient's allergies indicates no known allergies.  Home Medications  No current outpatient prescriptions on file.  BP 104/72  Pulse 104  Temp(Src) 98 F (36.7 C) (Oral)  Resp 16  SpO2 98%  Physical Exam  Vitals reviewed. Constitutional: He is oriented to person, place, and time. He appears well-developed and well-nourished.  HENT:  Head: Normocephalic and atraumatic.  Eyes: Conjunctivae and EOM are normal. No scleral icterus.  Cardiovascular: Regular rhythm.   No murmur heard.      Tachycardia  Pulmonary/Chest: Effort normal. No respiratory distress. He has no wheezes. He has no rales.  Abdominal: Soft. He exhibits distension. There is no tenderness. There is no guarding.       Hepatomegaly the liver edge is approximately 3 cm below.  The right costal margin.  Caput madusa  Musculoskeletal: Normal range of motion. He exhibits edema.       3+ pitting edema in bilateral lower extremities  Neurological: He is alert and oriented to person, place, and time.       No asterixis  Skin: Skin is warm and dry. There is pallor.  Psychiatric: He has a normal mood and affect. Thought content normal.    ED Course  Procedures (including critical care time)61 year old alcoholic male, presents emergency department with progressive weakness in his legs he has not had alcohol in 3 days.  Is not tremulous.  Oh he is mildly tachycardic.  We will establish an IV perform laboratory testing in preparation to admit him for detoxification from alcohol and treatment of alcoholism   Labs Reviewed  CBC  COMPREHENSIVE METABOLIC PANEL  ETHANOL  ACETAMINOPHEN LEVEL  URINE RAPID DRUG SCREEN (HOSP PERFORMED)   No results found.   No diagnosis found.  8:15 PM Spoke with the hospitalist.  She will admit for tx of hyponatremia and alcohlism  ED ECG REPORT   Date: 07/21/2011  EKG Time: 11:04 PM  Rate:  97  Rhythm: normal sinus rhythm,    Axis: nl  Intervals:none  ST&T Change: nonspecific  Narrative Interpretation: nsr with inf q waves and poor r wave progression            MDM  Hyponatremia alcoholism        Cheri Guppy, MD 07/21/11 2016  Cheri Guppy, MD 07/21/11 [phone]

## 2011-07-22 ENCOUNTER — Other Ambulatory Visit: Payer: Self-pay

## 2011-07-22 ENCOUNTER — Encounter (HOSPITAL_COMMUNITY): Payer: Self-pay | Admitting: *Deleted

## 2011-07-22 LAB — URINALYSIS, ROUTINE W REFLEX MICROSCOPIC
Bilirubin Urine: NEGATIVE
Glucose, UA: NEGATIVE mg/dL
Hgb urine dipstick: NEGATIVE
Ketones, ur: NEGATIVE mg/dL
Nitrite: NEGATIVE
Protein, ur: NEGATIVE mg/dL
Specific Gravity, Urine: 1.012 (ref 1.005–1.030)
Urobilinogen, UA: 2 mg/dL — ABNORMAL HIGH (ref 0.0–1.0)
pH: 7 (ref 5.0–8.0)

## 2011-07-22 LAB — BASIC METABOLIC PANEL
BUN: 13 mg/dL (ref 6–23)
GFR calc non Af Amer: >90 mL/min (ref >90)
Glucose, Bld: 99 mg/dL (ref 70–99)
Potassium: 3.9 mEq/L (ref 3.5–5.1)

## 2011-07-22 LAB — RAPID URINE DRUG SCREEN, HOSP PERFORMED
Amphetamines: NOT DETECTED
Benzodiazepines: POSITIVE — AB
Cocaine: NOT DETECTED
Opiates: NOT DETECTED
Tetrahydrocannabinol: NOT DETECTED

## 2011-07-22 LAB — CARDIAC PANEL(CRET KIN+CKTOT+MB+TROPI)
CK, MB: 1.1 ng/mL (ref 0.3–4.0)
Relative Index: INVALID (ref 0.0–2.5)
Relative Index: INVALID (ref 0.0–2.5)
Total CK: 27 U/L (ref 7–232)
Troponin I: <0.3 ng/mL (ref <0.30)

## 2011-07-22 LAB — CBC
HCT: 22.3 % — ABNORMAL LOW (ref 39.0–52.0)
Hemoglobin: 7.8 g/dL — ABNORMAL LOW (ref 13.0–17.0)
Hemoglobin: 8.1 g/dL — ABNORMAL LOW (ref 13.0–17.0)
MCH: 32.2 pg (ref 26.0–34.0)
MCH: 32.4 pg (ref 26.0–34.0)
MCHC: 35 g/dL (ref 30.0–36.0)
MCV: 92.3 fL (ref 78.0–100.0)
Platelets: 256 10*3/uL (ref 150–400)
Platelets: 264 10*3/uL (ref 150–400)
RBC: 2.46 MIL/uL — ABNORMAL LOW (ref 4.22–5.81)
RBC: 2.5 MIL/uL — ABNORMAL LOW (ref 4.22–5.81)
WBC: 8.6 10*3/uL (ref 4.0–10.5)
WBC: 8.7 10*3/uL (ref 4.0–10.5)

## 2011-07-22 LAB — TYPE AND SCREEN
ABO/RH(D): A POS
Antibody Screen: NEGATIVE

## 2011-07-22 LAB — HEPATIC FUNCTION PANEL
ALT: 14 U/L (ref 0–53)
AST: 69 U/L — ABNORMAL HIGH (ref 0–37)
Albumin: 2.1 g/dL — ABNORMAL LOW (ref 3.5–5.2)
Alkaline Phosphatase: 225 U/L — ABNORMAL HIGH (ref 39–117)
Indirect Bilirubin: 0.7 mg/dL (ref 0.3–0.9)
Total Protein: 6.5 g/dL (ref 6.0–8.3)

## 2011-07-22 LAB — FERRITIN: Ferritin: 1913 ng/mL — ABNORMAL HIGH (ref 22–322)

## 2011-07-22 LAB — TSH: TSH: 3.327 u[IU]/mL (ref 0.350–4.500)

## 2011-07-22 LAB — IRON AND TIBC: TIBC: 151 ug/dL — ABNORMAL LOW (ref 215–435)

## 2011-07-22 MED ORDER — ADULT MULTIVITAMIN W/MINERALS CH
1.0000 | ORAL_TABLET | Freq: Every day | ORAL | Status: DC
  Administered 2011-07-22 – 2011-07-29 (×8): 1 via ORAL
  Filled 2011-07-22 (×8): qty 1

## 2011-07-22 MED ORDER — ASPIRIN 81 MG PO TABS: 81.0000 mg | ORAL_TABLET | Freq: Every day | ORAL | Status: DC

## 2011-07-22 MED ORDER — CARVEDILOL 6.25 MG PO TABS
6.2500 mg | ORAL_TABLET | Freq: Every day | ORAL | Status: DC
  Administered 2011-07-22 – 2011-07-23 (×2): 6.25 mg via ORAL
  Filled 2011-07-22 (×2): qty 1

## 2011-07-22 MED ORDER — METRONIDAZOLE IN NACL 5-0.79 MG/ML-% IV SOLN
500.0000 mg | Freq: Four times a day (QID) | INTRAVENOUS | Status: DC
  Administered 2011-07-22 – 2011-07-26 (×15): 500 mg via INTRAVENOUS
  Filled 2011-07-22 (×19): qty 100

## 2011-07-22 MED ORDER — VITAMIN B-1 100 MG PO TABS
100.0000 mg | ORAL_TABLET | Freq: Every day | ORAL | Status: DC
  Administered 2011-07-22 – 2011-07-29 (×7): 100 mg via ORAL
  Filled 2011-07-22 (×8): qty 1

## 2011-07-22 MED ORDER — LORAZEPAM 2 MG/ML IJ SOLN
1.0000 mg | INTRAMUSCULAR | Status: DC | PRN
  Administered 2011-07-22 – 2011-07-25 (×3): 1 mg via INTRAVENOUS
  Filled 2011-07-22 (×3): qty 1

## 2011-07-22 MED ORDER — ASPIRIN 81 MG PO CHEW
81.0000 mg | CHEWABLE_TABLET | Freq: Every day | ORAL | Status: DC
  Administered 2011-07-22 – 2011-07-29 (×8): 81 mg via ORAL
  Filled 2011-07-22 (×7): qty 1

## 2011-07-22 MED ORDER — FOLIC ACID 1 MG PO TABS
1.0000 mg | ORAL_TABLET | Freq: Every day | ORAL | Status: DC
  Administered 2011-07-22 – 2011-07-29 (×8): 1 mg via ORAL
  Filled 2011-07-22 (×8): qty 1

## 2011-07-22 MED ORDER — LORAZEPAM 2 MG/ML IJ SOLN: 0.0000 mg | Freq: Two times a day (BID) | INTRAMUSCULAR | Status: AC

## 2011-07-22 MED ORDER — THIAMINE HCL 100 MG/ML IJ SOLN
100.0000 mg | Freq: Every day | INTRAMUSCULAR | Status: DC
  Administered 2011-07-23: 100 mg via INTRAVENOUS
  Filled 2011-07-22 (×7): qty 1

## 2011-07-22 MED ORDER — LORAZEPAM 1 MG PO TABS
1.0000 mg | ORAL_TABLET | Freq: Four times a day (QID) | ORAL | Status: DC | PRN
  Administered 2011-07-22: 1 mg via ORAL
  Filled 2011-07-22: qty 1

## 2011-07-22 MED ORDER — MOXIFLOXACIN HCL IN NACL 400 MG/250ML IV SOLN
400.0000 mg | INTRAVENOUS | Status: DC
  Administered 2011-07-22 – 2011-07-25 (×4): 400 mg via INTRAVENOUS
  Filled 2011-07-22 (×5): qty 250

## 2011-07-22 MED ORDER — ASPIRIN 81 MG PO CHEW
CHEWABLE_TABLET | ORAL | Status: AC
  Administered 2011-07-22: 81 mg via ORAL
  Filled 2011-07-22: qty 1

## 2011-07-22 MED ORDER — LORAZEPAM 2 MG/ML IJ SOLN: 1.0000 mg | Freq: Four times a day (QID) | INTRAMUSCULAR | Status: DC | PRN

## 2011-07-22 MED ORDER — LORAZEPAM 2 MG/ML IJ SOLN
0.0000 mg | Freq: Four times a day (QID) | INTRAMUSCULAR | Status: AC
  Administered 2011-07-22: 2 mg via INTRAVENOUS
  Administered 2011-07-22 – 2011-07-23 (×3): 1 mg via INTRAVENOUS
  Filled 2011-07-22 (×3): qty 1

## 2011-07-22 NOTE — ED Notes (Signed)
Dr. Adela Glimpse notified of pt's HR. Dr. Adela Glimpse states she will make a note of it and will continue to monitor the HR.

## 2011-07-22 NOTE — Progress Notes (Signed)
Called by RN to inform me that patients heart rate continues to increase. After ativan patient is resting comfortably he appears to be fluid overloaded with large ascites. Patient was recently given lasix. No other evidence of withdrawal noted.  May need IR consult to have LVP done for comfort and this may improve his work of breathing somewhat.  Continue to monitor at this point. Will order ECG to document rhythm.    Chart including problem list, medications, labs and vitals were reviewed  Chevella Pearce 6:51 AM

## 2011-07-22 NOTE — ED Notes (Signed)
Dr. Arthor Captain returned page re pt's temp and HR.  No antipyretic med ordered at this tiime, states since pt is asleep.  He reports that the elevated temp could be from the withdrawal that pt is experiencing.  He requested to be contacted again if when pt awaken and is still febrile.  Diane RN notified

## 2011-07-22 NOTE — ED Notes (Addendum)
Pt states his last alcoholic beverage was Tuesday evening

## 2011-07-22 NOTE — Progress Notes (Signed)
DAILY PROGRESS NOTE                              [facility]AL INTERNAL [facility]ICINE [facility]  SUBJECTIVE: Patient is confused. His former wife is at bedside.  OBJECTIVE: BP 147/99  Pulse 130  Temp(Src) 97.8 F (36.6 C) (Oral)  Resp 35  SpO2 95%  Intake/Output Summary (Last 24 hours) at 07/22/11 1526 Last data filed at 07/22/11 0556  Gross per 24 hour  Intake 1018.5 ml  Output    550 ml  Net  468.5 ml                      Weight change:  Physical Exam: General: Disoriented, not in any acute distress. HEENT: anicteric sclera, pupils equal reactive to light and accommodation CVS: S1-S2 heard, no murmur rubs or gallops Chest: clear to auscultation bilaterally, no wheezing rales or rhonchi Abdomen:  normal bowel sounds, soft, nontender, nondistended, no organomegaly Neuro: Cranial nerves II-XII intact, no focal neurological deficits Extremities: no cyanosis, no clubbing or edema noted bilaterally   Lab Results:  Basename 07/22/11 0412 07/21/11 2040  NA 127* 126*  K 3.9 3.4*  CL 91* 88*  CO2 24 24  GLUCOSE 99 95  BUN 13 14  CREATININE 0.83 0.90  CALCIUM 7.6* 7.7*  MG -- 1.1*  PHOS -- 3.8    Basename 07/22/11 0412 07/21/11 2040  AST 69* 64*  ALT 14 15  ALKPHOS 225* 217*  BILITOT 2.1* 1.8*  PROT 6.5 6.6  ALBUMIN 2.1* 2.1*   No results found for this basename: LIPASE:2,AMYLASE:2 in the last 72 hours  Basename 07/22/11 0412 07/22/11 0200 07/21/11 2040  WBC 8.3 8.6 --  NEUTROABS -- -- 6.1  HGB 7.8* 8.0* --  HCT 22.3* 22.7* --  MCV 92.1 92.3 --  PLT 248 256 --    Basename 07/22/11 1242 07/22/11 0412 07/21/11 2040  CKTOTAL 40 27 32  CKMB 1.1 1.1 1.1  CKMBINDEX -- -- --  TROPONINI <0.30 <0.30 <0.30   No components found with this basename: POCBNP:3 No results found for this basename: DDIMER:2 in the last 72 hours  Basename 07/21/11 2040  HGBA1C 6.6*    Basename 07/21/11 2040  CHOL 130  HDL 22*  LDLCALC 91  TRIG 84  CHOLHDL 5.9  LDLDIRECT --     Basename 07/21/11 2040  TSH 3.327  T4TOTAL --  T3FREE --  THYROIDAB --    Basename 07/21/11 2040  VITAMINB12 --  FOLATE --  FERRITIN 1913*  TIBC 151*  IRON 59  RETICCTPCT --    Micro Results: No results found for this or any previous visit (from the past 240 hour(s)).  Studies/Results: Dg Chest 1 View  07/21/2011  *RADIOLOGY REPORT*  Clinical Data: The CHF  CHEST - 1 VIEW  Comparison: 12/13/2009  Findings: Heart size is normal.  No pleural effusion or edema.  No airspace consolidation identified.  The lungs appear under inflated  IMPRESSION:  1.  No acute cardiopulmonary abnormalities.  Original Report Authenticated By: Rosealee Albee, M.D.   US Abdomen Complete  07/21/2011  *RADIOLOGY REPORT*  Clinical Data:  Elevated LFTs.  ABDOMINAL ULTRASOUND COMPLETE  Comparison:  None  Findings:  Gallbladder:  The gallbladder is partially decompressed; gallbladder wall thickening is nonspecific in the presence of ascites and gallbladder decompression.  Sludge is noted within the gallbladder.  No ultrasonographic Murphy's sign is elicited.  Common Bile  Duct:  Not definitely seen due to the overlying liver; may measure 0.5 cm in diameter.  Liver:  The liver is significantly enlarged, measuring more than 22 cm in length.  There is diffusely increased hepatic echogenicity and coarsened echotexture, compatible with extensive fatty infiltration.  The liver is not well assessed due to the extent of parenchymal coarsening.  No definite hepatic masses are identified, though it cannot be excluded on the basis of this study.  Limited Doppler evaluation does not demonstrate the hepatic vasculature.  IVC:  Unremarkable in appearance.  Pancreas:  Although the pancreas is difficult to visualize in its entirety due to overlying bowel gas, no focal pancreatic abnormality is identified.  Spleen:  13.5 cm in length; mildly enlarged, with grossly normal echotexture.  Right kidney:  13.0 cm in length; normal in  size, configuration and parenchymal echogenicity.  No evidence of mass or hydronephrosis.  Left kidney:  11.3 cm in length; normal in size, configuration and parenchymal echogenicity.  No evidence of mass or hydronephrosis.  Abdominal Aorta:  Not visualized due to bowel gas and the overlying liver.  A small amount of free fluid is noted throughout the abdomen and pelvis.  IMPRESSION:  1.  Significant hepatomegaly; the liver measures more than 22 cm in length. Extensive diffuse fatty infiltration noted.  The liver is not well assessed due to the extent of fatty infiltration.  No definite hepatic mass is noted, though it cannot be excluded on the basis of this study.  The hepatic vasculature is not well assessed.  If there is significant clinical concern for an underlying lesion, dynamic liver protocol MRI or CT could be considered for further evaluation, when and as deemed clinically appropriate. 2.  Mild splenomegaly. 3.  Sludge noted within the gallbladder, without definite evidence for obstruction or cholecystitis. 4.  Trace ascites noted within the abdomen and pelvis.  Original Report Authenticated By: Tonia Ghent, M.D.   Medications: Scheduled Meds:   . aspirin  81 mg Oral Daily  . carvedilol  6.25 mg Oral Daily  . folic acid  1 mg Oral Daily  . furosemide  40 mg Intravenous Q12H  . LORazepam  0-4 mg Intravenous Q6H   Followed by  . LORazepam  0-4 mg Intravenous Q12H  . megestrol  800 mg Oral Daily  . mulitivitamin with minerals  1 tablet Oral Daily  . potassium chloride  10 mEq Intravenous Q1 Hr x 2  . potassium chloride  20 mEq Oral BID  . thiamine  100 mg Oral Daily   Or  . thiamine  100 mg Intravenous Daily  . DISCONTD: aspirin  81 mg Oral Daily  . DISCONTD: aspirin  81 mg Oral Daily  . DISCONTD: furosemide  40 mg Oral Daily   Continuous Infusions:   . DISCONTD: sodium chloride Stopped (07/21/11 2054)  . DISCONTD: sodium chloride Stopped (07/21/11 2220)   PRN Meds:.LORazepam,  DISCONTD: LORazepam, DISCONTD: LORazepam  ASSESSMENT & PLAN: Principal Problem:  *Lower extremity weakness Active Problems:  Hyponatremia  Anemia of chronic disease  Abnormal LFTs  Systolic CHF, chronic  Moderate protein-calorie malnutrition  Delirium tremens -Alcohol withdrawal, patient used to drink about a fifth of vodka a day. -Last drink on Sunday or Monday. -Patient started on CIWA protocol, tolerating lorazepam very well.  Fever -This can be secondary to alcohol withdrawal, cannot rule out infections. -Patient has negative urinalysis, clear chest x-ray and no evidence of skin infections. -I will start empirically on antibiotics follow the trend  of the fever as well as WBCs count.  Hyponatremia -This is likely secondary to CHF versus diuretic use. -Patient is on Lasix 40 mg IV every 12 hour, we will continue and monitor the electrolytes.  Lower extremity weakness -This is likely part of generalized weakness patient got with alcohol withdrawal. -I will check his TSH, and CPK.  Abnormal liver function tests -This is secondary to alcohol/chronic liver disease. -Liver ultrasound showed hepatomegaly with diffuse fatty infiltration. I will follow the trend of the LFTs.   Anemia -Probably secondary to chronic disease. Ferritin, B12 and iron profile is pending.  -hemoglobin dropped ALT checked FOBT. At home [facility] transfused 1 unit of packed RBCs.   LOS: 1 day   Zacheriah Stumpe A 07/22/2011, 3:26 PM

## 2011-07-22 NOTE — Progress Notes (Addendum)
ANTIBIOTIC CONSULT NOTE - INITIAL  Pharmacy Consult for Avelox/Flagyl Indication: Empiric coverage of fever, aspiration PNA  Allergies  Allergen Reactions  . Adhesive (Tape)     Use paper tape only  . Celebrex (Celecoxib)     Also Vioxx  . Cephalosporins   . Clindamycin/Lincomycin   . Dexon (Dexamethasone Sodium Phosphate)   . Duraprep   . Erythromycin   . Hctz (Hydrochlorothiazide)   . Latex   . Other Hives    "About 3/4 of all antibiotics." Does not remember which ones he can take.  . Toprol Xl (Metoprolol Succinate)   . Zocor (Simvastatin)     Patient Measurements:   Vital Signs: Temp: 97.8 F (36.6 C) (03/21 1415) Temp src: Oral (03/21 1415) BP: 147/99 mmHg (03/21 1415) Pulse Rate: 130  (03/21 1415) Intake/Output from previous day: 03/20 0701 - 03/21 0700 In: 1018.5 [P.O.:236; I.V.:500; Blood:282.5] Out: 550 [Urine:550] Intake/Output from this shift:    Labs:  Basename 07/22/11 0412 07/22/11 0200 07/21/11 2040 07/21/11 1723  WBC 8.3 8.6 7.7 --  HGB 7.8* 8.0* 7.2* --  PLT 248 256 265 --  LABCREA -- -- -- --  CREATININE 0.83 -- 0.90 0.93   CrCl  ~90 ml/min (normalized)   Microbiology: 3/21 urine: pending  Medical History: Past Medical History  Diagnosis Date  . CHF (congestive heart failure)   . Hypertension   . GERD (gastroesophageal reflux disease)   . Alcohol abuse     Medications:  Scheduled:    . aspirin  81 mg Oral Daily  . carvedilol  6.25 mg Oral Daily  . folic acid  1 mg Oral Daily  . furosemide  40 mg Intravenous Q12H  . LORazepam  0-4 mg Intravenous Q6H   Followed by  . LORazepam  0-4 mg Intravenous Q12H  . megestrol  800 mg Oral Daily  . mulitivitamin with minerals  1 tablet Oral Daily  . potassium chloride  10 mEq Intravenous Q1 Hr x 2  . potassium chloride  20 mEq Oral BID  . thiamine  100 mg Oral Daily   Or  . thiamine  100 mg Intravenous Daily  . DISCONTD: aspirin  81 mg Oral Daily  . DISCONTD: aspirin  81 mg Oral  Daily  . DISCONTD: furosemide  40 mg Oral Daily   Assessment:  [name] [name] presents with delirium tremens  Starting empiric antibiotics for fever 100.4  WBC wnl, normal renal function  Multiple antibiotic allergies listed,  Goal of Therapy:  Eradication of infection  Plan: Due to multiple allergies Avelox 400mg  IV q24h Flagyl 500mg  IV q6h No renal or hepatic dose adjustments needed, Pharmacy will sign-off.  Please re-consult if needed.  Thank you  Loralee Pacas, PharmD, BCPS Pager: [phone] 07/22/2011,3:41 PM

## 2011-07-23 DIAGNOSIS — I509 Heart failure, unspecified: Secondary | ICD-10-CM

## 2011-07-23 LAB — GLUCOSE, CAPILLARY: Glucose-Capillary: 82 mg/dL (ref 70–99)

## 2011-07-23 LAB — COMPREHENSIVE METABOLIC PANEL
Albumin: 2 g/dL — ABNORMAL LOW (ref 3.5–5.2)
BUN: 14 mg/dL (ref 6–23)
Creatinine, Ser: 0.82 mg/dL (ref 0.50–1.35)
GFR calc Af Amer: >90 mL/min (ref >90)
Glucose, Bld: 98 mg/dL (ref 70–99)
Total Bilirubin: 1.8 mg/dL — ABNORMAL HIGH (ref 0.3–1.2)
Total Protein: 6.4 g/dL (ref 6.0–8.3)

## 2011-07-23 LAB — URINE CULTURE: Culture  Setup Time: 201303211441

## 2011-07-23 LAB — CBC
HCT: 23.3 % — ABNORMAL LOW (ref 39.0–52.0)
MCV: 93.6 fL (ref 78.0–100.0)
Platelets: 259 10*3/uL (ref 150–400)
RBC: 2.49 MIL/uL — ABNORMAL LOW (ref 4.22–5.81)
WBC: 8.1 10*3/uL (ref 4.0–10.5)

## 2011-07-23 MED ORDER — CARVEDILOL 12.5 MG PO TABS
12.5000 mg | ORAL_TABLET | Freq: Every day | ORAL | Status: DC
  Administered 2011-07-24 – 2011-07-25 (×2): 12.5 mg via ORAL
  Filled 2011-07-23 (×2): qty 1

## 2011-07-23 NOTE — Evaluation (Signed)
Physical Therapy Evaluation Patient Details Name: [name] [name] MRN: [id number] DOB: [birth year] Today's Date: 07/23/2011  Problem List:  Patient Active Problem List  Diagnoses  . Lower extremity weakness  . Hyponatremia  . Anemia of chronic disease  . Abnormal LFTs  . Systolic CHF, chronic  . Moderate protein-calorie malnutrition    Past Medical History:  Past Medical History  Diagnosis Date  . CHF (congestive heart failure)   . Hypertension   . GERD (gastroesophageal reflux disease)   . Alcohol abuse    Past Surgical History:  Past Surgical History  Procedure Date  . Appendectomy   . Throat surgery     surgery to help with snoring  . Shoulder open rotator cuff repair     2 on right shoulder, 1 on left shoulder    PT Assessment/Plan/Recommendation PT Assessment Clinical Impression Statement: Pt presents with diagnosis of LE weakness. Pt is currently requiring +2 assist for mobility. Pt will benefit from skilled PT in acute setting to improve strength, activity tolerance, gait in preparation for d/C.  PT Recommendation/Assessment: Patient will need skilled PT in the acute care venue PT Problem List: Decreased strength;Decreased activity tolerance;Decreased mobility;Decreased safety awareness;Decreased knowledge of use of DME;Decreased cognition PT Therapy Diagnosis : Difficulty walking;Generalized weakness PT Plan PT Frequency: Min 3X/week PT Treatment/Interventions: DME instruction;Gait training;Functional mobility training;Therapeutic activities;Therapeutic exercise PT Recommendation Recommendations for Other Services: OT consult Follow Up Recommendations: Skilled nursing facility Equipment Recommended: Defer to next venue PT Goals  Acute Rehab PT Goals PT Goal Formulation: With patient Time For Goal Achievement: 2 weeks Pt will go Supine/Side to Sit: with min assist PT Goal: Supine/Side to Sit - Progress: Goal set today Pt will go Sit to Supine/Side: with min  assist PT Goal: Sit to Supine/Side - Progress: Goal set today Pt will go Sit to Stand: with mod assist PT Goal: Sit to Stand - Progress: Goal set today Pt will go Stand to Sit: with mod assist PT Goal: Stand to Sit - Progress: Goal set today Pt will Transfer Bed to Chair/Chair to Bed: with mod assist PT Transfer Goal: Bed to Chair/Chair to Bed - Progress: Goal set today Pt will Ambulate: 1 - 15 feet;with max assist;with rolling walker PT Goal: Ambulate - Progress: Goal set today Pt will Perform Home Exercise Program: with supervision, verbal cues required/provided PT Goal: Perform Home Exercise Program - Progress: Goal set today  PT Evaluation Precautions/Restrictions  Precautions Precautions: Fall Prior Functioning  Home Living Lives With:  (sister) Type of Home: House Home Layout: One level Home Access: Level entry Additional Comments: Difficulty getting history from pt due to speech(mumbling) and ? some cognitive deficits Prior Function Level of Independence: Independent with gait;Independent with basic ADLs Comments: Unsure of accuracy of history from pt Cognition Cognition Arousal/Alertness: Lethargic Overall Cognitive Status: Difficult to assess Difficult to assess due to: impaired communication Orientation Level: Oriented to person;Disoriented to time;Disoriented to situation;Disoriented to place Sensation/Coordination Sensation Light Touch: Appears Intact Extremity Assessment RLE Strength RLE Overall Strength Comments: Strength 3-/5 throughout LLE Strength LLE Overall Strength Comments: Strength 3-/5 throughout Mobility (including Balance) Bed Mobility Bed Mobility: Yes Supine to Sit: HOB elevated (Comment degrees);With rails;1: +2 Total assist Supine to Sit Details (indicate cue type and reason): Pt=50%. Increased time. Multimodal cues for safety, technique, hand placement. Assist for bil LEs off EOB and trunk to upright Transfers Transfers: Yes Sit to Stand:  1: +2 Total assist;From bed;With upper extremity assist;From elevated surface Sit to Stand Details (indicate  cue type and reason): Pt=30%. Multimodal cues for safety, technique, posture. Pt's weight shifted posteriorly in standing with hip/trunk flexion. Pt unable to weightshift for foot positioning. Assist to support pt in standing with RW.  Stand to Sit: 1: +2 Total assist;With upper extremity assist;To bed Stand to Sit Details: Pt=30%. Multimodal cues for safety, technique, Assist to control descent.  Squat Pivot Transfers: 1: +2 Total assist Squat Pivot Transfer Details (indicate cue type and reason): Pt=30%. Multimodal cues for safety, posture, encouraging steps. Assist to support pt, weightshifting, guidance on lower half to recliner seat.  Ambulation/Gait Ambulation/Gait: No  Posture/Postural Control Posture/Postural Control: Postural limitations Postural Limitations: flexed posturing in standing.  Exercise    End of Session PT - End of Session Equipment Utilized During Treatment: Gait belt Activity Tolerance: Patient limited by fatigue Patient left: in chair;with call bell in reach Nurse Communication: Mobility status for transfers General Behavior During Session: [facility] for tasks performed Cognition: Impaired  Rebeca Alert [facility] 07/23/2011, 2:17 PM [phone]

## 2011-07-23 NOTE — Progress Notes (Signed)
CARE MANAGEMENT NOTE 07/23/2011  Patient:  [name] [name], [name] [name]   Account Number:  [id number]  Date Initiated:  07/23/2011  Documentation initiated by:  [name] [name]  Subjective/Objective Assessment:   pt with confusion hgb 7.8 on admission and hypotensive     Action/Plan:   lives at home   Anticipated DC Date:  07/26/2011   Anticipated DC Plan:  HOME/SELF CARE  In-house referral  NA      DC Planning Services  NA  NA      PAC Choice  NA   Choice offered to / List presented to:  NA   DME arranged  NA      DME agency  NA     HH arranged  NA      HH agency  NA   Status of service:  In process, will continue to follow Medicare Important Message given?  NA - LOS <3 / Initial given by admissions (If response is "NO", the following Medicare IM given date fields will be blank) Date Medicare IM given:   Date Additional Medicare IM given:    Discharge Disposition:    Per UR Regulation:  Reviewed for med. necessity/level of care/duration of stay  If discussed at Long Length of Stay Meetings, dates discussed:    Comments:  [id number]/[name] Yohn,RN,BSN,CCM

## 2011-07-23 NOTE — Progress Notes (Signed)
*  PRELIMINARY RESULTS* Echocardiogram 2D Echocardiogram has been performed.  Glean Salen [facility] 07/23/2011, 12:15 PM

## 2011-07-23 NOTE — Progress Notes (Signed)
INITIAL ADULT NUTRITION ASSESSMENT Date: 07/23/2011   Time: 4:30 PM Reason for Assessment: consult  ASSESSMENT: Male 61 y.o.  Dx: Lower extremity weakness  Hx:  Past Medical History  Diagnosis Date  . CHF (congestive heart failure)   . Hypertension   . GERD (gastroesophageal reflux disease)   . Alcohol abuse    Past Surgical History  Procedure Date  . Appendectomy   . Throat surgery     surgery to help with snoring  . Shoulder open rotator cuff repair     2 on right shoulder, 1 on left shoulder    Related Meds:  Scheduled Meds:   . aspirin  81 mg Oral Daily  . carvedilol  12.5 mg Oral Q breakfast  . folic acid  1 mg Oral Daily  . furosemide  40 mg Intravenous Q12H  . LORazepam  0-4 mg Intravenous Q6H   Followed by  . LORazepam  0-4 mg Intravenous Q12H  . megestrol  800 mg Oral Daily  . metronidazole  500 mg Intravenous Q6H  . moxifloxacin  400 mg Intravenous Q24H  . mulitivitamin with minerals  1 tablet Oral Daily  . potassium chloride  20 mEq Oral BID  . thiamine  100 mg Oral Daily   Or  . thiamine  100 mg Intravenous Daily  . DISCONTD: carvedilol  6.25 mg Oral Daily   Continuous Infusions:  PRN Meds:.LORazepam  Noted megace suspension reported on home med list is being continued.    Ht: 5\' 11"  (180.3 cm)  Wt: 195 lb 8.8 oz (88.7 kg)  Ideal Wt: 78.0 kg % Ideal Wt: 113%  Usual Wt: Difficult to assess.  Pt weight was 117 kg on admission.  RN reports pt with generalized edema.  Pt with abnormal LFTs, h/o alcohol abuse, no mention of chronic liver disease or ascites.  Body mass index is 27.27 kg/(m^2).  Food/Nutrition Related Hx: h/o alcohol abuse- one-fifth of liquor/day  Labs:  CMP     Component Value Date/Time   NA 130* 07/23/2011 0440   K 3.8 07/23/2011 0440   CL 94* 07/23/2011 0440   CO2 25 07/23/2011 0440   GLUCOSE 98 07/23/2011 0440   BUN 14 07/23/2011 0440   CREATININE 0.82 07/23/2011 0440   CALCIUM 7.9* 07/23/2011 0440   PROT 6.4 07/23/2011  0440   ALBUMIN 2.0* 07/23/2011 0440   AST 82* 07/23/2011 0440   ALT 14 07/23/2011 0440   ALKPHOS 206* 07/23/2011 0440   BILITOT 1.8* 07/23/2011 0440   GFRNONAA >90 07/23/2011 0440   GFRAA >90 07/23/2011 0440    CBC    Component Value Date/Time   WBC 8.1 07/23/2011 0440   RBC 2.49* 07/23/2011 0440   HGB 8.0* 07/23/2011 0440   HCT 23.3* 07/23/2011 0440   PLT 259 07/23/2011 0440   MCV 93.6 07/23/2011 0440   MCH 32.1 07/23/2011 0440   MCHC 34.3 07/23/2011 0440   RDW 17.5* 07/23/2011 0440   LYMPHSABS 0.9 07/21/2011 2040   MONOABS 0.6 07/21/2011 2040   EOSABS 0.1 07/21/2011 2040   BASOSABS 0.0 07/21/2011 2040    Intake: ate well at lunch today with ex-wife's help per RN, 0% breakfast this am. Output:   Intake/Output Summary (Last 24 hours) at 07/23/11 1639 Last data filed at 07/23/11 1500  Gross per 24 hour  Intake    200 ml  Output   1500 ml  Net  -1300 ml   1 BM overnight  Diet Order: Cardiac  Supplements/Tube Feeding:  None at this time  IVF:    Estimated Nutritional Needs:   Kcal: 2200-2460 kcal Protein: 70-88 g Fluid: ~2.4 L/day  Pt admitted with weakness in legs, LE swelling.  Pt reported compliance with medications.  Also reports alcohol abuse currently on CIWA protocol with AMS and tremors. Discussed with RN as pt is not able to state history or preferences at this time.  Pt having difficulty with body perception- will bring food close to his mouth, but cannot find his mouth.  Did not eat breakfast this am, but ate relatively well at lunch with assistance from ex-wife.    Wt hx difficult to assess at this time.  Pt with severe, generalized edema on admission which has lessened with lasix  NUTRITION DIAGNOSIS: -Inadequate oral intake (NI-2.1).  Status: Ongoing  RELATED TO: EtOH withdrawal, AMS  AS EVIDENCE BY: meal skipping, requiring feeding assistance  MONITORING/EVALUATION(Goals): 1.  Food/Beverage; PO intake to improve as MS improves.  Pt to consume >75% of  meals  EDUCATION NEEDS: -Education not appropriate at this time  INTERVENTION: 1.  General healthful diet; encourage intake as able, provide feeding assistance if needed while pt completes withdrawal/detox.  Will assess need for supplements once pt able to return to baseline. 2.  Nutrition-related meds; MD dx pt with moderate malnutrition, pt on megace at home.  RD unable to find hx related to nutritional status or intake to determine degree of malnutrition or need for megace at this time.  Past medical hx unsuspecting for malnutrition aside from alcohol abuse.  Will continue to assess.  Dietitian #: [phone]  DOCUMENTATION CODES Per approved criteria  -Not Applicable --Moderate malnutrition per MD notes.    Loyce Dys Sue-Ellen 07/23/2011, 4:30 PM

## 2011-07-23 NOTE — Progress Notes (Signed)
DAILY PROGRESS NOTE                              GENERAL [facility]NAL [facility]ICINE [facility]  SUBJECTIVE: Patient awake and alert, better than yesterday.  OBJECTIVE: BP 121/74  Pulse 110  Temp(Src) 99.7 F (37.6 C) (Oral)  Resp 20  Ht 5\' 11"  (1.803 m)  Wt 88.7 kg (195 lb 8.8 oz)  BMI 27.27 kg/m2  SpO2 94%  Intake/Output Summary (Last 24 hours) at 07/23/11 1131 Last data filed at 07/23/11 4540  Gross per 24 hour  Intake    200 ml  Output   1550 ml  Net  -1350 ml                      Weight change:  Physical Exam: General: awake and alertin any acute distress. HEENT: anicteric sclera, pupils equal reactive to light and accommodation CVS: S1-S2 heard, no murmur rubs or gallops Chest: clear to auscultation bilaterally, no wheezing rales or rhonchi Abdomen:  normal bowel sounds, soft, nontender, nondistended, no organomegaly Neuro: Cranial nerves II-XII intact, no focal neurological deficits Extremities: no cyanosis, no clubbing or edema noted bilaterally   Lab Results:  Basename 07/23/11 0440 07/22/11 0412 07/21/11 2040  NA 130* 127* --  K 3.8 3.9 --  CL 94* 91* --  CO2 25 24 --  GLUCOSE 98 99 --  BUN 14 13 --  CREATININE 0.82 0.83 --  CALCIUM 7.9* 7.6* --  MG 1.1* -- 1.1*  PHOS -- -- 3.8    Basename 07/23/11 0440 07/22/11 0412  AST 82* 69*  ALT 14 14  ALKPHOS 206* 225*  BILITOT 1.8* 2.1*  PROT 6.4 6.5  ALBUMIN 2.0* 2.1*   Basename 07/23/11 0440 07/22/11 1930 07/21/11 2040  WBC 8.1 8.7 --  NEUTROABS -- -- 6.1  HGB 8.0* 8.1* --  HCT 23.3* 23.3* --  MCV 93.6 93.2 --  PLT 259 264 --    Basename 07/22/11 1242 07/22/11 0412 07/21/11 2040  CKTOTAL 40 27 32  CKMB 1.1 1.1 1.1  CKMBINDEX -- -- --  TROPONINI <0.30 <0.30 <0.30   Basename 07/21/11 2040  HGBA1C 6.6*    Basename 07/21/11 2040  CHOL 130  HDL 22*  LDLCALC 91  TRIG 84  CHOLHDL 5.9  LDLDIRECT --    Basename 07/21/11 2040  TSH 3.327  T4TOTAL --  T3FREE --  THYROIDAB --    Basename  07/21/11 2040  VITAMINB12 --  FOLATE --  FERRITIN 1913*  TIBC 151*  IRON 59  RETICCTPCT --    Micro Results: No results found for this or any previous visit (from the past 240 hour(s)).  Studies/Results: Dg Chest 1 View  07/21/2011  *RADIOLOGY REPORT*  Clinical Data: The CHF  CHEST - 1 VIEW  Comparison: 12/13/2009  Findings: Heart size is normal.  No pleural effusion or edema.  No airspace consolidation identified.  The lungs appear under inflated  IMPRESSION:  1.  No acute cardiopulmonary abnormalities.  Original Report Authenticated By: Rosealee Albee, M.D.   US Abdomen Complete  07/21/2011  *RADIOLOGY REPORT*  Clinical Data:  Elevated LFTs.  ABDOMINAL ULTRASOUND COMPLETE  Comparison:  None  Findings:  Gallbladder:  The gallbladder is partially decompressed; gallbladder wall thickening is nonspecific in the presence of ascites and gallbladder decompression.  Sludge is noted within the gallbladder.  No ultrasonographic Murphy's sign is elicited.  Common Bile Duct:  Not  definitely seen due to the overlying liver; may measure 0.5 cm in diameter.  Liver:  The liver is significantly enlarged, measuring more than 22 cm in length.  There is diffusely increased hepatic echogenicity and coarsened echotexture, compatible with extensive fatty infiltration.  The liver is not well assessed due to the extent of parenchymal coarsening.  No definite hepatic masses are identified, though it cannot be excluded on the basis of this study.  Limited Doppler evaluation does not demonstrate the hepatic vasculature.  IVC:  Unremarkable in appearance.  Pancreas:  Although the pancreas is difficult to visualize in its entirety due to overlying bowel gas, no focal pancreatic abnormality is identified.  Spleen:  13.5 cm in length; mildly enlarged, with grossly normal echotexture.  Right kidney:  13.0 cm in length; normal in size, configuration and parenchymal echogenicity.  No evidence of mass or hydronephrosis.  Left  kidney:  11.3 cm in length; normal in size, configuration and parenchymal echogenicity.  No evidence of mass or hydronephrosis.  Abdominal Aorta:  Not visualized due to bowel gas and the overlying liver.  A small amount of free fluid is noted throughout the abdomen and pelvis.  IMPRESSION:  1.  Significant hepatomegaly; the liver measures more than 22 cm in length. Extensive diffuse fatty infiltration noted.  The liver is not well assessed due to the extent of fatty infiltration.  No definite hepatic mass is noted, though it cannot be excluded on the basis of this study.  The hepatic vasculature is not well assessed.  If there is significant clinical concern for an underlying lesion, dynamic liver protocol MRI or CT could be considered for further evaluation, when and as deemed clinically appropriate. 2.  Mild splenomegaly. 3.  Sludge noted within the gallbladder, without definite evidence for obstruction or cholecystitis. 4.  Trace ascites noted within the abdomen and pelvis.  Original Report Authenticated By: Tonia Ghent, M.D.   Medications: Scheduled Meds:    . aspirin  81 mg Oral Daily  . carvedilol  6.25 mg Oral Daily  . folic acid  1 mg Oral Daily  . furosemide  40 mg Intravenous Q12H  . LORazepam  0-4 mg Intravenous Q6H   Followed by  . LORazepam  0-4 mg Intravenous Q12H  . megestrol  800 mg Oral Daily  . metronidazole  500 mg Intravenous Q6H  . moxifloxacin  400 mg Intravenous Q24H  . mulitivitamin with minerals  1 tablet Oral Daily  . potassium chloride  20 mEq Oral BID  . thiamine  100 mg Oral Daily   Or  . thiamine  100 mg Intravenous Daily  . DISCONTD: aspirin  81 mg Oral Daily  . DISCONTD: aspirin  81 mg Oral Daily   Continuous Infusions:  PRN Meds:.LORazepam, DISCONTD: LORazepam, DISCONTD: LORazepam  ASSESSMENT & PLAN: Principal Problem:  *Lower extremity weakness Active Problems:  Hyponatremia  Anemia of chronic disease  Abnormal LFTs  Systolic CHF, chronic   Moderate protein-calorie malnutrition  Delirium tremens -Alcohol withdrawal, patient used to drink about a fifth of vodka a day. -Last drink on Sunday or Monday. -Patient started on CIWA protocol, tolerating lorazepam very well.  Fever -This can be secondary to alcohol withdrawal, cannot rule out infections. -Patient has negative urinalysis, clear chest x-ray and no evidence of skin infections. -I will start empirically on antibiotics follow the trend of the fever as well as WBCs count.  Hyponatremia -This is likely secondary to CHF versus diuretic use. -Patient is on Lasix 40 mg  IV every 12 hour, we will continue and monitor the electrolytes.  Lower extremity weakness -This is likely part of generalized weakness patient got with alcohol withdrawal. -I will check his TSH, and CPK.  Abnormal liver function tests -This is secondary to alcohol/chronic liver disease. -Liver ultrasound showed hepatomegaly with diffuse fatty infiltration. I will follow the trend of the LFTs.   Anemia -Probably secondary to chronic disease. Ferritin, B12 and iron profile is pending.  -hemoglobin dropped, check  FOBT. At home on PRADAXA transfused 1 unit of packed RBCs.   LOS: 2 days   Kyzen Horn A 07/23/2011, 11:31 AM

## 2011-07-24 ENCOUNTER — Inpatient Hospital Stay (HOSPITAL_COMMUNITY): Payer: 59

## 2011-07-24 LAB — COMPREHENSIVE METABOLIC PANEL
ALT: 14 U/L (ref 0–53)
AST: 83 U/L — ABNORMAL HIGH (ref 0–37)
Albumin: 1.9 g/dL — ABNORMAL LOW (ref 3.5–5.2)
Alkaline Phosphatase: 177 U/L — ABNORMAL HIGH (ref 39–117)
CO2: 25 mEq/L (ref 19–32)
Chloride: 99 mEq/L (ref 96–112)
Creatinine, Ser: 0.83 mg/dL (ref 0.50–1.35)
GFR calc non Af Amer: >90 mL/min (ref >90)
Potassium: 3.8 mEq/L (ref 3.5–5.1)
Sodium: 136 mEq/L (ref 135–145)
Total Bilirubin: 1.5 mg/dL — ABNORMAL HIGH (ref 0.3–1.2)

## 2011-07-24 MED ORDER — FUROSEMIDE 40 MG PO TABS
40.0000 mg | ORAL_TABLET | Freq: Every day | ORAL | Status: DC
  Administered 2011-07-24 – 2011-07-29 (×6): 40 mg via ORAL
  Filled 2011-07-24 (×6): qty 1

## 2011-07-24 NOTE — Progress Notes (Signed)
DAILY PROGRESS NOTE                              GENERAL INTERNAL MEDICINE [facility]  SUBJECTIVE: Patient awake and alert, disoriented.  OBJECTIVE: BP 122/76  Pulse 90  Temp(Src) 98.2 F (36.8 C) (Axillary)  Resp 20  Ht 5\' 11"  (1.803 m)  Wt 86.7 kg (191 lb 2.2 oz)  BMI 26.66 kg/m2  SpO2 93%  Intake/Output Summary (Last 24 hours) at 07/24/11 1052 Last data filed at 07/24/11 0245  Gross per 24 hour  Intake    470 ml  Output   1700 ml  Net  -1230 ml                      Weight change: -31.235 kg (-68 lb 13.8 oz) Physical Exam: General: awake and alert, no acute distress. HEENT: anicteric sclera, pupils equal reactive to light and accommodation CVS: S1-S2 heard, no murmur rubs or gallops Chest: clear to auscultation bilaterally, no wheezing rales or rhonchi Abdomen:  normal bowel sounds, soft, nontender, nondistended, no organomegaly Neuro: Cranial nerves II-XII intact, no focal neurological deficits Extremities: no cyanosis, no clubbing or edema noted bilaterally   Lab Results:  Basename 07/24/11 0530 07/23/11 0440 07/21/11 2040  NA 136 130* --  K 3.8 3.8 --  CL 99 94* --  CO2 25 25 --  GLUCOSE 100* 98 --  BUN 14 14 --  CREATININE 0.83 0.82 --  CALCIUM 7.7* 7.9* --  MG -- 1.1* 1.1*  PHOS -- -- 3.8    Basename 07/24/11 0530 07/23/11 0440  AST 83* 82*  ALT 14 14  ALKPHOS 177* 206*  BILITOT 1.5* 1.8*  PROT 6.2 6.4  ALBUMIN 1.9* 2.0*    Basename 07/23/11 0440 07/22/11 1930 07/21/11 2040  WBC 8.1 8.7 --  NEUTROABS -- -- 6.1  HGB 8.0* 8.1* --  HCT 23.3* 23.3* --  MCV 93.6 93.2 --  PLT 259 264 --    Basename 07/22/11 1242 07/22/11 0412 07/21/11 2040  CKTOTAL 40 27 32  CKMB 1.1 1.1 1.1  CKMBINDEX -- -- --  TROPONINI <0.30 <0.30 <0.30    Basename 07/21/11 2040  HGBA1C 6.6*    Basename 07/21/11 2040  CHOL 130  HDL 22*  LDLCALC 91  TRIG 84  CHOLHDL 5.9  LDLDIRECT --    Basename 07/21/11 2040  TSH 3.327  T4TOTAL --  T3FREE --    THYROIDAB --    Basename 07/21/11 2040  VITAMINB12 --  FOLATE --  FERRITIN 1913*  TIBC 151*  IRON 59  RETICCTPCT --    Micro Results: Recent Results (from the past 240 hour(s))  URINE CULTURE     Status: Normal   Collection Time   07/22/11 12:39 PM      Component Value Range Status Comment   Specimen Description URINE, CATHETERIZED   Final    Special Requests NONE   Final    Culture  Setup Time [id number]   Final    Colony Count NO GROWTH   Final    Culture NO GROWTH   Final    Report Status 07/23/2011 FINAL   Final     Studies/Results: Dg Chest 1 View  07/21/2011  *RADIOLOGY REPORT*  Clinical Data: The CHF  CHEST - 1 VIEW  Comparison: 12/13/2009  Findings: Heart size is normal.  No pleural effusion or edema.  No airspace consolidation identified.  The lungs  appear under inflated  IMPRESSION:  1.  No acute cardiopulmonary abnormalities.  Original Report Authenticated By: Rosealee Albee, M.D.   US Abdomen Complete  07/21/2011  *RADIOLOGY REPORT*  Clinical Data:  Elevated LFTs.  ABDOMINAL ULTRASOUND COMPLETE  Comparison:  None  Findings:  Gallbladder:  The gallbladder is partially decompressed; gallbladder wall thickening is nonspecific in the presence of ascites and gallbladder decompression.  Sludge is noted within the gallbladder.  No ultrasonographic Murphy's sign is elicited.  Common Bile Duct:  Not definitely seen due to the overlying liver; may measure 0.5 cm in diameter.  Liver:  The liver is significantly enlarged, measuring more than 22 cm in length.  There is diffusely increased hepatic echogenicity and coarsened echotexture, compatible with extensive fatty infiltration.  The liver is not well assessed due to the extent of parenchymal coarsening.  No definite hepatic masses are identified, though it cannot be excluded on the basis of this study.  Limited Doppler evaluation does not demonstrate the hepatic vasculature.  IVC:  Unremarkable in appearance.  Pancreas:   Although the pancreas is difficult to visualize in its entirety due to overlying bowel gas, no focal pancreatic abnormality is identified.  Spleen:  13.5 cm in length; mildly enlarged, with grossly normal echotexture.  Right kidney:  13.0 cm in length; normal in size, configuration and parenchymal echogenicity.  No evidence of mass or hydronephrosis.  Left kidney:  11.3 cm in length; normal in size, configuration and parenchymal echogenicity.  No evidence of mass or hydronephrosis.  Abdominal Aorta:  Not visualized due to bowel gas and the overlying liver.  A small amount of free fluid is noted throughout the abdomen and pelvis.  IMPRESSION:  1.  Significant hepatomegaly; the liver measures more than 22 cm in length. Extensive diffuse fatty infiltration noted.  The liver is not well assessed due to the extent of fatty infiltration.  No definite hepatic mass is noted, though it cannot be excluded on the basis of this study.  The hepatic vasculature is not well assessed.  If there is significant clinical concern for an underlying lesion, dynamic liver protocol MRI or CT could be considered for further evaluation, when and as deemed clinically appropriate. 2.  Mild splenomegaly. 3.  Sludge noted within the gallbladder, without definite evidence for obstruction or cholecystitis. 4.  Trace ascites noted within the abdomen and pelvis.  Original Report Authenticated By: Tonia Ghent, M.D.   Medications: Scheduled Meds:    . aspirin  81 mg Oral Daily  . carvedilol  12.5 mg Oral Q breakfast  . folic acid  1 mg Oral Daily  . furosemide  40 mg Intravenous Q12H  . LORazepam  0-4 mg Intravenous Q6H   Followed by  . LORazepam  0-4 mg Intravenous Q12H  . megestrol  800 mg Oral Daily  . metronidazole  500 mg Intravenous Q6H  . moxifloxacin  400 mg Intravenous Q24H  . mulitivitamin with minerals  1 tablet Oral Daily  . potassium chloride  20 mEq Oral BID  . thiamine  100 mg Oral Daily   Or  . thiamine  100 mg  Intravenous Daily  . DISCONTD: carvedilol  6.25 mg Oral Daily   Continuous Infusions:  PRN Meds:.LORazepam  ASSESSMENT & PLAN: Principal Problem:  *Lower extremity weakness Active Problems:  Hyponatremia  Anemia of chronic disease  Abnormal LFTs  Systolic CHF, chronic  Moderate protein-calorie malnutrition  Delirium tremens -Alcohol withdrawal, patient used to drink about a fifth of vodka a  day. -Last drink on Sunday or Monday. Still has slurred speech, I will scan his head to rule out acute abnormalities. -Patient started on CIWA protocol, improving less use of Ativan.  Fever -This can be secondary to alcohol withdrawal, cannot rule out infections. Can be also secondary to transfusion. -Patient has negative urinalysis, clear chest x-ray and no evidence of skin infections. -I will start empirically on antibiotics follow the trend of the fever as well as WBCs count.  Hyponatremia -This is likely secondary to CHF versus diuretic use. -Patient is on Lasix 40 mg IV every 12 hour, we will continue and monitor the electrolytes.  Chronic combined CHF -2-D echocardiogram showed marked improvement in his LVEF to 58% from 23%. -Continue Coreg and Lasix.  Lower extremity weakness -This is likely part of generalized weakness patient got with alcohol withdrawal. -I will check his TSH, and CPK.  Abnormal liver function tests -This is secondary to alcohol/chronic liver disease. -Liver ultrasound showed hepatomegaly with diffuse fatty infiltration. I will follow the trend of the LFTs.   Anemia -Probably secondary to chronic disease. Ferritin, B12 and iron profile is pending.  -hemoglobin dropped, check  FOBT. At home on PRADAXA, transfused 1 unit of packed RBCs.   LOS: 3 days   [name] [name] A 07/24/2011, 10:52 AM

## 2011-07-25 LAB — COMPREHENSIVE METABOLIC PANEL
ALT: 15 U/L (ref 0–53)
BUN: 15 mg/dL (ref 6–23)
CO2: 25 mEq/L (ref 19–32)
Calcium: 7.6 mg/dL — ABNORMAL LOW (ref 8.4–10.5)
Creatinine, Ser: 0.76 mg/dL (ref 0.50–1.35)
GFR calc Af Amer: >90 mL/min (ref >90)
GFR calc non Af Amer: >90 mL/min (ref >90)
Glucose, Bld: 83 mg/dL (ref 70–99)
Total Protein: 6.4 g/dL (ref 6.0–8.3)

## 2011-07-25 LAB — CBC
HCT: 24 % — ABNORMAL LOW (ref 39.0–52.0)
Hemoglobin: 7.9 g/dL — ABNORMAL LOW (ref 13.0–17.0)
MCHC: 32.9 g/dL (ref 30.0–36.0)
WBC: 7.8 10*3/uL (ref 4.0–10.5)

## 2011-07-25 LAB — GLUCOSE, CAPILLARY: Glucose-Capillary: 76 mg/dL (ref 70–99)

## 2011-07-25 MED ORDER — CARVEDILOL 12.5 MG PO TABS
12.5000 mg | ORAL_TABLET | Freq: Two times a day (BID) | ORAL | Status: DC
  Administered 2011-07-25 – 2011-07-29 (×8): 12.5 mg via ORAL
  Filled 2011-07-25 (×9): qty 1

## 2011-07-25 NOTE — Progress Notes (Signed)
DAILY PROGRESS NOTE                              GENERAL INTERNAL MEDICINE [facility]  SUBJECTIVE: Patient awake and alert, sitting on chair at bedside, but disoriented. His former wife at bedside.  OBJECTIVE: BP 106/67  Pulse 97  Temp(Src) 97.5 F (36.4 C) (Oral)  Resp 19  Ht 5\' 11"  (1.803 m)  Wt 85.4 kg (188 lb 4.4 oz)  BMI 26.26 kg/m2  SpO2 90%  Intake/Output Summary (Last 24 hours) at 07/25/11 1203 Last data filed at 07/25/11 9811  Gross per 24 hour  Intake    910 ml  Output   1260 ml  Net   -350 ml                      Weight change: -1.3 kg (-2 lb 13.9 oz) Physical Exam: General: awake and alert, no acute distress. HEENT: anicteric sclera, pupils equal reactive to light and accommodation CVS: S1-S2 heard, no murmur rubs or gallops Chest: clear to auscultation bilaterally, no wheezing rales or rhonchi Abdomen:  normal bowel sounds, soft, nontender, nondistended, no organomegaly Neuro: Cranial nerves II-XII intact, no focal neurological deficits Extremities: no cyanosis, no clubbing or edema noted bilaterally   Lab Results:  Basename 07/25/11 0537 07/24/11 0530 07/23/11 0440  NA 137 136 --  K 3.6 3.8 --  CL 102 99 --  CO2 25 25 --  GLUCOSE 83 100* --  BUN 15 14 --  CREATININE 0.76 0.83 --  CALCIUM 7.6* 7.7* --  MG -- -- 1.1*  PHOS -- -- --    Basename 07/25/11 0537 07/24/11 0530  AST 93* 83*  ALT 15 14  ALKPHOS 193* 177*  BILITOT 1.2 1.5*  PROT 6.4 6.2  ALBUMIN 1.9* 1.9*    Basename 07/25/11 0537 07/23/11 0440  WBC 7.8 8.1  NEUTROABS -- --  HGB 7.9* 8.0*  HCT 24.0* 23.3*  MCV 96.4 93.6  PLT 292 259    Basename 07/22/11 1242  CKTOTAL 40  CKMB 1.1  CKMBINDEX --  TROPONINI <0.30   Micro Results: Recent Results (from the past 240 hour(s))  URINE CULTURE     Status: Normal   Collection Time   07/22/11 12:39 PM      Component Value Range Status Comment   Specimen Description URINE, CATHETERIZED   Final    Special Requests NONE    Final    Culture  Setup Time [id number]   Final    Colony Count NO GROWTH   Final    Culture NO GROWTH   Final    Report Status 07/23/2011 FINAL   Final     Studies/Results: Dg Chest 1 View  07/21/2011  *RADIOLOGY REPORT*  Clinical Data: The CHF  CHEST - 1 VIEW  Comparison: 12/13/2009  Findings: Heart size is normal.  No pleural effusion or edema.  No airspace consolidation identified.  The lungs appear under inflated  IMPRESSION:  1.  No acute cardiopulmonary abnormalities.  Original Report Authenticated By: Rosealee Albee, M.D.   US Abdomen Complete  07/21/2011  *RADIOLOGY REPORT*  Clinical Data:  Elevated LFTs.  ABDOMINAL ULTRASOUND COMPLETE  Comparison:  None  Findings:  Gallbladder:  The gallbladder is partially decompressed; gallbladder wall thickening is nonspecific in the presence of ascites and gallbladder decompression.  Sludge is noted within the gallbladder.  No ultrasonographic Murphy's sign is elicited.  Common Bile  Duct:  Not definitely seen due to the overlying liver; may measure 0.5 cm in diameter.  Liver:  The liver is significantly enlarged, measuring more than 22 cm in length.  There is diffusely increased hepatic echogenicity and coarsened echotexture, compatible with extensive fatty infiltration.  The liver is not well assessed due to the extent of parenchymal coarsening.  No definite hepatic masses are identified, though it cannot be excluded on the basis of this study.  Limited Doppler evaluation does not demonstrate the hepatic vasculature.  IVC:  Unremarkable in appearance.  Pancreas:  Although the pancreas is difficult to visualize in its entirety due to overlying bowel gas, no focal pancreatic abnormality is identified.  Spleen:  13.5 cm in length; mildly enlarged, with grossly normal echotexture.  Right kidney:  13.0 cm in length; normal in size, configuration and parenchymal echogenicity.  No evidence of mass or hydronephrosis.  Left kidney:  11.3 cm in length; normal in  size, configuration and parenchymal echogenicity.  No evidence of mass or hydronephrosis.  Abdominal Aorta:  Not visualized due to bowel gas and the overlying liver.  A small amount of free fluid is noted throughout the abdomen and pelvis.  IMPRESSION:  1.  Significant hepatomegaly; the liver measures more than 22 cm in length. Extensive diffuse fatty infiltration noted.  The liver is not well assessed due to the extent of fatty infiltration.  No definite hepatic mass is noted, though it cannot be excluded on the basis of this study.  The hepatic vasculature is not well assessed.  If there is significant clinical concern for an underlying lesion, dynamic liver protocol MRI or CT could be considered for further evaluation, when and as deemed clinically appropriate. 2.  Mild splenomegaly. 3.  Sludge noted within the gallbladder, without definite evidence for obstruction or cholecystitis. 4.  Trace ascites noted within the abdomen and pelvis.  Original Report Authenticated By: Tonia Ghent, M.D.   Medications: Scheduled Meds:    . aspirin  81 mg Oral Daily  . carvedilol  12.5 mg Oral Q breakfast  . folic acid  1 mg Oral Daily  . furosemide  40 mg Oral Daily  . LORazepam  0-4 mg Intravenous Q12H  . megestrol  800 mg Oral Daily  . metronidazole  500 mg Intravenous Q6H  . moxifloxacin  400 mg Intravenous Q24H  . mulitivitamin with minerals  1 tablet Oral Daily  . potassium chloride  20 mEq Oral BID  . thiamine  100 mg Oral Daily   Or  . thiamine  100 mg Intravenous Daily   Continuous Infusions:  PRN Meds:.LORazepam  ASSESSMENT & PLAN: Principal Problem:  *Lower extremity weakness Active Problems:  Hyponatremia  Anemia of chronic disease  Abnormal LFTs  Systolic CHF, chronic  Moderate protein-calorie malnutrition  Delirium tremens -Alcohol withdrawal, patient used to drink about a fifth of vodka a day. -Last drink on Sunday or Monday. Still has slurred speech, I will scan his head to  rule out acute abnormalities. -Patient started on CIWA protocol, improving.  Fever -This can be secondary to alcohol withdrawal, cannot rule out infections. Can be also secondary to transfusion. -Patient has negative urinalysis, clear chest x-ray and no evidence of skin infections. -I will start empirically on antibiotics follow the trend of the fever as well as WBCs count.  Hyponatremia -This is likely secondary to CHF versus diuretic use. -Patient is on Lasix 40 mg IV every 12 hour, we will continue and monitor the electrolytes.  Chronic  combined CHF -2-D echocardiogram showed marked improvement in his LVEF to 58% from 23%. -Continue Coreg and Lasix.  Lower extremity weakness -This is likely part of generalized weakness patient got with alcohol withdrawal. -I will check his TSH, and CPK.  Abnormal liver function tests -This is secondary to alcohol/chronic liver disease. -Liver ultrasound showed hepatomegaly with diffuse fatty infiltration. I will follow the trend of the LFTs.   Anemia -Probably secondary to chronic disease. Ferritin is in the high side and iron is 59. -hemoglobin dropped, check  FOBT. At home on PRADAXA, transfused 1 unit of packed RBCs.   LOS: 4 days   Gary Bultman A 07/25/2011, 12:03 PM

## 2011-07-26 ENCOUNTER — Encounter: Payer: Self-pay | Admitting: Gastroenterology

## 2011-07-26 DIAGNOSIS — E871 Hypo-osmolality and hyponatremia: Secondary | ICD-10-CM

## 2011-07-26 DIAGNOSIS — R7989 Other specified abnormal findings of blood chemistry: Secondary | ICD-10-CM

## 2011-07-26 DIAGNOSIS — R195 Other fecal abnormalities: Secondary | ICD-10-CM | POA: Diagnosis present

## 2011-07-26 DIAGNOSIS — D649 Anemia, unspecified: Secondary | ICD-10-CM

## 2011-07-26 LAB — CBC
HCT: 24 % — ABNORMAL LOW (ref 39.0–52.0)
Platelets: 304 10*3/uL (ref 150–400)
RBC: 2.49 MIL/uL — ABNORMAL LOW (ref 4.22–5.81)
RDW: 18.7 % — ABNORMAL HIGH (ref 11.5–15.5)
WBC: 7.1 10*3/uL (ref 4.0–10.5)

## 2011-07-26 LAB — BASIC METABOLIC PANEL
CO2: 25 mEq/L (ref 19–32)
Calcium: 7.5 mg/dL — ABNORMAL LOW (ref 8.4–10.5)
Chloride: 103 mEq/L (ref 96–112)
Creatinine, Ser: 0.8 mg/dL (ref 0.50–1.35)
GFR calc Af Amer: >90 mL/min (ref >90)
Sodium: 138 mEq/L (ref 135–145)

## 2011-07-26 LAB — CLOSTRIDIUM DIFFICILE BY PCR: Toxigenic C. Difficile by PCR: NEGATIVE

## 2011-07-26 LAB — OCCULT BLOOD X 1 CARD TO LAB, STOOL: Fecal Occult Bld: POSITIVE

## 2011-07-26 MED ORDER — METRONIDAZOLE 500 MG PO TABS
500.0000 mg | ORAL_TABLET | Freq: Four times a day (QID) | ORAL | Status: DC
Start: 2011-07-26 — End: 2011-07-29
  Administered 2011-07-26 – 2011-07-29 (×13): 500 mg via ORAL
  Filled 2011-07-26 (×16): qty 1

## 2011-07-26 MED ORDER — MOXIFLOXACIN HCL 400 MG PO TABS
400.0000 mg | ORAL_TABLET | Freq: Every day | ORAL | Status: DC
  Administered 2011-07-26 – 2011-07-28 (×3): 400 mg via ORAL
  Filled 2011-07-26 (×4): qty 1

## 2011-07-26 MED ORDER — POTASSIUM CHLORIDE CRYS ER 20 MEQ PO TBCR
40.0000 meq | EXTENDED_RELEASE_TABLET | Freq: Once | ORAL | Status: AC
  Administered 2011-07-26: 40 meq via ORAL
  Filled 2011-07-26: qty 2

## 2011-07-26 NOTE — Consult Note (Signed)
Referring Provider: Dr. Arthor Captain- [facility] Primary Care Physician:  Kaleen Mask, MD, MD Primary Gastroenterologist:  Dr.Kaplan ?remote  Reason for Consultation:  Anemia, heme positive stool  HPI: [name] [name] is a 61 y.o. male admitted to the hospital 4 days ago with generalized weakness which was progressive over a couple of weeks prior to admission. Patient had gotten to the point where he was unable to ambulate. A very heavy alcohol use which he discontinued about 3 days prior to admission.he had been drinking a Fifth of alcohol per day and at times up to a gallon per day. CT of the head on admission shows a severe cortical and cerebellar atrophy Patient also has history of congestive heart failure and hypertension. Review of his meds also showed that he was on Pradaxa at home at night and not clear what this was for . Patient was found to be anemic on admission with hemoglobin in the 8 range, this is normocytic and he is not iron deficient. Stool for occult blood is positive and we are asked to evaluate for his anemia and Hemoccult-positive stool. There are no colonoscopy records available in the chart, or affect however patient's ex-wife states that he did have a colonoscopy by Dr. Arlyce Dice about 8 years ago and had 2 polyps removed at that time and also was found to have some diverticulosis. He was recommended to have follow up in 5 years.  Eyes any regular aspirin or NSAID use denies be seizing Goody powders. He denies any abdominal pain or changes in his bowel habits recently, had not noted any melena or hematochezia. He denies heartburn or indigestion,does have acid reflux. No dysphagia.    Past Medical History  Diagnosis Date  . CHF (congestive heart failure)   . Hypertension   . GERD (gastroesophageal reflux disease)   . Alcohol abuse     Past Surgical History  Procedure Date  . Appendectomy   . Throat surgery     surgery to help with snoring  . Shoulder open  rotator cuff repair     2 on right shoulder, 1 on left shoulder    Prior to Admission medications   Medication Sig Start Date End Date Taking? Authorizing Provider  allopurinol (ZYLOPRIM) 300 MG tablet Take 300 mg by mouth daily.   Yes Historical Provider, MD  amitriptyline (ELAVIL) 10 MG tablet Take 10 mg by mouth at bedtime.   Yes Historical Provider, MD  aspirin 81 MG tablet Take 81 mg by mouth daily.   Yes Historical Provider, MD  carvedilol (COREG) 6.25 MG tablet Take 6.25 mg by mouth daily.    Yes Historical Provider, MD  chlordiazePOXIDE (LIBRIUM) 25 MG capsule Take 50-100 mg by mouth See admin instructions. Pt takes 2 to 4 caps every 3 hours max 12 a day   Yes Historical Provider, MD  ciprofloxacin (CIPRO) 500 MG tablet Take 500 mg by mouth 2 (two) times daily.   Yes Historical Provider, MD  Dabigatran Etexilate Mesylate (PRADAXA PO) Take by mouth.   Yes Historical Provider, MD  doxycycline (VIBRA-TABS) 100 MG tablet Take 100 mg by mouth 2 (two) times daily.   Yes Historical Provider, MD  furosemide (LASIX) 40 MG tablet Take 40 mg by mouth daily.   Yes Historical Provider, MD  megestrol (MEGACE) 40 MG/ML suspension Take 800 mg by mouth daily. Take 4 teaspoons (26ml)by mouth once daily.   Yes Historical Provider, MD  omeprazole (PRILOSEC) 20 MG capsule Take 20 mg by mouth daily.  Yes Historical Provider, MD  potassium chloride 20 MEQ/15ML (10%) solution Take 20 mEq by mouth 2 (two) times daily.   Yes Historical Provider, MD    Current Facility-Administered Medications  Medication Dose Route Frequency Provider Last Rate Last Dose  . aspirin chewable tablet 81 mg  81 mg Oral Daily Clydia Llano, MD   81 mg at 07/26/11 1002  . carvedilol (COREG) tablet 12.5 mg  12.5 mg Oral BID WC Clydia Llano, MD   12.5 mg at 07/26/11 0755  . folic acid (FOLVITE) tablet 1 mg  1 mg Oral Daily Therisa Doyne, MD   1 mg at 07/26/11 1003  . furosemide (LASIX) tablet 40 mg  40 mg Oral Daily Clydia Llano,  MD   40 mg at 07/26/11 1002  . LORazepam (ATIVAN) injection 0-4 mg  0-4 mg Intravenous Q12H Therisa Doyne, MD      . LORazepam (ATIVAN) injection 1-2 mg  1-2 mg Intravenous Q4H PRN Clydia Llano, MD   1 mg at 07/25/11 0511  . megestrol (MEGACE) 40 MG/ML suspension 800 mg  800 mg Oral Daily Alison Murray, MD   800 mg at 07/26/11 1003  . metroNIDAZOLE (FLAGYL) tablet 500 mg  500 mg Oral Q6H Clydia Llano, MD   500 mg at 07/26/11 1200  . moxifloxacin (AVELOX) tablet 400 mg  400 mg Oral q1800 Clydia Llano, MD      . mulitivitamin with minerals tablet 1 tablet  1 tablet Oral Daily Therisa Doyne, MD   1 tablet at 07/26/11 1003  . potassium chloride 20 MEQ/15ML (10%) liquid 20 mEq  20 mEq Oral BID Alison Murray, MD   20 mEq at 07/26/11 1001  . thiamine (VITAMIN B-1) tablet 100 mg  100 mg Oral Daily Therisa Doyne, MD   100 mg at 07/26/11 1003   Or  . thiamine (B-1) injection 100 mg  100 mg Intravenous Daily Therisa Doyne, MD   100 mg at 07/23/11 1116  . DISCONTD: metroNIDAZOLE (FLAGYL) IVPB 500 mg  500 mg Intravenous Q6H Rollene Fare, PHARMD   500 mg at 07/26/11 0543  . DISCONTD: moxifloxacin (AVELOX) IVPB 400 mg  400 mg Intravenous Q24H Rollene Fare, PHARMD   400 mg at 07/25/11 1601    Allergies as of 07/21/2011 - Review Complete 07/21/2011  Allergen Reaction Noted  . Adhesive (tape)  07/21/2011  . Celebrex (celecoxib)  07/21/2011  . Cephalosporins  07/21/2011  . Clindamycin/lincomycin  07/21/2011  . Dexon (dexamethasone sodium phosphate)  07/21/2011  . Duraprep  07/21/2011  . Erythromycin  07/21/2011  . Hctz (hydrochlorothiazide)  07/21/2011  . Latex  07/21/2011  . Other Hives 07/21/2011  . Toprol xl (metoprolol succinate)  07/21/2011  . Zocor (simvastatin)  07/21/2011    History reviewed. No pertinent family history.  History   Social History  . Marital Status: Married    Spouse Name: N/A    Number of Children: N/A  . Years of Education: N/A    Occupational History  . Not on file.   Social History Main Topics  . Smoking status: Former Smoker -- 40 years    Quit date: 07/21/1983  . Smokeless tobacco: Never Used  . Alcohol Use: Yes     1/5th day, none x 3 days, hx of 1 gallon a day in the past  . Drug Use: No  . Sexually Active:    Other Topics Concern  . Not on file   Social History Narrative  . No narrative  on file    Review of Systems: Pertinent positive and negative review of systems were noted in the above HPI section.  All other review of systems was otherwise negative.oblems with DM, thyroid, adrenal function.  Physical Exam: Vital signs in last 24 hours: Temp:  [97.5 F (36.4 C)-99 F (37.2 C)] 99 F (37.2 C) (03/25 1504) Pulse Rate:  [86-94] 92  (03/25 1504) Resp:  [16-18] 16  (03/25 1504) BP: (91-112)/(62-75) 91/62 mmHg (03/25 1504) SpO2:  [93 %-97 %] 97 % (03/25 1504) Weight:  [190 lb 4.1 oz (86.3 kg)] 190 lb 4.1 oz (86.3 kg) (03/25 0513) Last BM Date: 07/26/11 General:   Alert,  Well-developed, well-nourished, pleasant and cooperative in NAD ,appears older than stated age Head:  Normocephalic and atraumatic. Eyes:  Sclera clear, no icterus.   Conjunctiva pale. Ears:  Normal auditory acuity. Nose:  No deformity, discharge,  or lesions. Mouth:  No deformity or lesions.   Neck:  Supple; no masses or thyromegaly. Lungs:  Clear throughout to auscultation.   No wheezes, crackles, or rhonchi. Heart:  Regular rate and rhythm; no murmurs, clicks, rubs,  or gallops. Abdomen:  Soft,nontender, BS active,liver is large and palpable down 4 fb below right costal margin,+umbilical hernia   Rectal:  Deferred,documented heme positive  Msk:  Symmetrical without gross deformities,some atrophy . Pulses:  Normal pulses noted. Extremities:  Without clubbing or edema. Neurologic:  Alert and  oriented x4;  extremiity weakness all 4 ext, no asterixis Skin:  Intact without significant lesions or rashes.. Psych:  Alert  and cooperative. Normal mood and affect.  Intake/Output from previous day: 03/24 0701 - 03/25 0700 In: 1320 [P.O.:720; IV Piggyback:600] Out: 425 [Urine:425] Intake/Output this shift: Total I/O In: 480 [P.O.:480] Out: -   Lab Results:  Basename 07/26/11 0455 07/25/11 0537  WBC 7.1 7.8  HGB 8.0* 7.9*  HCT 24.0* 24.0*  PLT 304 292   BMET  Basename 07/26/11 0455 07/25/11 0537 07/24/11 0530  NA 138 137 136  K 3.7 3.6 3.8  CL 103 102 99  CO2 25 25 25   GLUCOSE 106* 83 100*  BUN 19 15 14   CREATININE 0.80 0.76 0.83  CALCIUM 7.5* 7.6* 7.7*   LFT  Basename 07/25/11 0537  PROT 6.4  ALBUMIN 1.9*  AST 93*  ALT 15  ALKPHOS 193*  BILITOT 1.2  BILIDIR --  IBILI --   PT/INR No results found for this basename: LABPROT:2,INR:2 in the last 72 hours     Studies/Results: Abdominal US reviewed IMPRESSION:  #1  61 yo male alcoholic, with normocytic anemia, heme positive stool of unclear etiology. R/o alcohol induced gastropathy, PUD, vs occult colon lesion. 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 He does not have lab evidence for cirrhosis- with normal platelets,INR #6 CHF #7 hypoalbuminemia  PLAN: #1 continue PPI rx #2 pt will need EGD and colonoscopy but would like him to be a bit stronger, and thru withdrawal prior to prepping and sedating. We will follow along and aim for procedures late in week.   Amy Esterwood  07/26/2011, 3:40 PM

## 2011-07-26 NOTE — Progress Notes (Signed)
CSW spoke with patient's wife, [name] [name] (cell#: [phone]) re: discharge planning. Wife states that the [employer] plan does terminate 3/27 but [facility] (policy #: [id number] C group#: [id number]) is effective immediately so there should be no lapse in insurance coverage. CSW completed FL2 and faxed out to [facility] to see which facility has a contract with both insurances. CSW will follow-up with bed offers in the morning.   Unice Bailey, LCSWA [phone]

## 2011-07-26 NOTE — Progress Notes (Signed)
PHARMACIST - PHYSICIAN COMMUNICATION CONCERNING: Antibiotic IV to Oral Route Change Policy  RECOMMENDATION: This patient is receiving Avelox and Flagyl by the intravenous route.  Based on criteria approved by the Pharmacy and Therapeutics Committee, the antibiotic(s) is/are being converted to the equivalent oral dose form(s).   DESCRIPTION: These criteria include:  Patient being treated for a respiratory tract infection, urinary tract infection, or cellulitis  The patient is not neutropenic and does not exhibit a GI malabsorption state  The patient is eating (either orally or via tube) and/or has been taking other orally administered medications for a least 24 hours  The patient is improving clinically and has a Tmax < 100.5    MD:  Patient is on Avelox and Flagyl for aspiration PNA.  Please d/c Flagyl given duplicate anaerobic coverage.     Geoffry Paradise, PharmD.   Pager:  [phone] 10:42 AM

## 2011-07-26 NOTE — Progress Notes (Signed)
DAILY PROGRESS NOTE                              GENERAL [facility]NAL [facility]IC[facility]  SUBJECTIVE: Patient awake and alert, sitting on chair at bedside, but disoriented.  OBJECTIVE: BP 91/62  Pulse 92  Temp(Src) 99 F (37.2 C) (Axillary)  Resp 16  Ht 5\' 11"  (1.803 m)  Wt 86.3 kg (190 lb 4.1 oz)  BMI 26.54 kg/m2  SpO2 97%  Intake/Output Summary (Last 24 hours) at 07/26/11 1627 Last data filed at 07/26/11 1504  Gross per 24 hour  Intake    920 ml  Output    425 ml  Net    495 ml                      Weight change: 0.9 kg (1 lb 15.7 oz) Physical Exam: General: awake and alert, no acute distress. HEENT: anicteric sclera, pupils equal reactive to light and accommodation CVS: S1-S2 heard, no murmur rubs or gallops Chest: clear to auscultation bilaterally, no wheezing rales or rhonchi Abdomen:  normal bowel sounds, soft, nontender, nondistended, no organomegaly Neuro: Cranial nerves II-XII intact, no focal neurological deficits Extremities: no cyanosis, no clubbing or edema noted bilaterally   Lab Results:  [facility] 07/26/11 0455 07/25/11 0537  NA 138 137  K 3.7 3.6  CL 103 102  CO2 25 25  GLUCOSE 106* 83  BUN 19 15  CREATININE 0.80 0.76  CALCIUM 7.5* 7.6*  MG -- --  PHOS -- --    Basename 07/25/11 0537 07/24/11 0530  AST 93* 83*  ALT 15 14  ALKPHOS 193* 177*  BILITOT 1.2 1.5*  PROT 6.4 6.2  ALBUMIN 1.9* 1.9*    Basename 07/26/11 0455 07/25/11 0537  WBC 7.1 7.8  NEUTROABS -- --  HGB 8.0* 7.9*  HCT 24.0* 24.0*  MCV 96.4 96.4  PLT 304 292   No results found for this basename: CKTOTAL:3,CKMB:3,CKMBINDEX:3,TROPONINI:3 in the last 72 hours Micro Results: Recent Results (from the past 240 hour(s))  URINE CULTURE     Status: Normal   Collection Time   07/22/11 12:39 PM      Component Value Range Status Comment   Specimen Description URINE, CATHETERIZED   Final    Special Requests NONE   Final    Culture  Setup Time [id number]   Final    Colony  Count NO GROWTH   Final    Culture NO GROWTH   Final    Report Status 07/23/2011 FINAL   Final   CLOSTRIDIUM DIFFICILE BY PCR     Status: Normal   Collection Time   07/26/11  9:25 AM      Component Value Range Status Comment   C difficile by pcr NEGATIVE  NEGATIVE  Final     Studies/Results: Dg Chest 1 View  07/21/2011  *RADIOLOGY REPORT*  Clinical Data: The CHF  CHEST - 1 VIEW  Comparison: 12/13/2009  Findings: Heart size is normal.  No pleural effusion or edema.  No airspace consolidation identified.  The lungs appear under inflated  IMPRESSION:  1.  No acute cardiopulmonary abnormalities.  Original Report Authenticated By: Rosealee Albee, M.D.   US Abdomen Complete  07/21/2011  *RADIOLOGY REPORT*  Clinical Data:  Elevated LFTs.  ABDOMINAL ULTRASOUND COMPLETE  Comparison:  None  Findings:  Gallbladder:  The gallbladder is partially decompressed; gallbladder wall thickening is nonspecific in the presence  of ascites and gallbladder decompression.  Sludge is noted within the gallbladder.  No ultrasonographic Murphy's sign is elicited.  Common Bile Duct:  Not definitely seen due to the overlying liver; may measure 0.5 cm in diameter.  Liver:  The liver is significantly enlarged, measuring more than 22 cm in length.  There is diffusely increased hepatic echogenicity and coarsened echotexture, compatible with extensive fatty infiltration.  The liver is not well assessed due to the extent of parenchymal coarsening.  No definite hepatic masses are identified, though it cannot be excluded on the basis of this study.  Limited Doppler evaluation does not demonstrate the hepatic vasculature.  IVC:  Unremarkable in appearance.  Pancreas:  Although the pancreas is difficult to visualize in its entirety due to overlying bowel gas, no focal pancreatic abnormality is identified.  Spleen:  13.5 cm in length; mildly enlarged, with grossly normal echotexture.  Right kidney:  13.0 cm in length; normal in size,  configuration and parenchymal echogenicity.  No evidence of mass or hydronephrosis.  Left kidney:  11.3 cm in length; normal in size, configuration and parenchymal echogenicity.  No evidence of mass or hydronephrosis.  Abdominal Aorta:  Not visualized due to bowel gas and the overlying liver.  A small amount of free fluid is noted throughout the abdomen and pelvis.  IMPRESSION:  1.  Significant hepatomegaly; the liver measures more than 22 cm in length. Extensive diffuse fatty infiltration noted.  The liver is not well assessed due to the extent of fatty infiltration.  No definite hepatic mass is noted, though it cannot be excluded on the basis of this study.  The hepatic vasculature is not well assessed.  If there is significant clinical concern for an underlying lesion, dynamic liver protocol MRI or CT could be considered for further evaluation, when and as deemed clinically appropriate. 2.  Mild splenomegaly. 3.  Sludge noted within the gallbladder, without definite evidence for obstruction or cholecystitis. 4.  Trace ascites noted within the abdomen and pelvis.  Original Report Authenticated By: Tonia Ghent, M.D.   Medications: Scheduled Meds:    . aspirin  81 mg Oral Daily  . carvedilol  12.5 mg Oral BID WC  . folic acid  1 mg Oral Daily  . furosemide  40 mg Oral Daily  . LORazepam  0-4 mg Intravenous Q12H  . megestrol  800 mg Oral Daily  . metroNIDAZOLE  500 mg Oral Q6H  . moxifloxacin  400 mg Oral q1800  . mulitivitamin with minerals  1 tablet Oral Daily  . potassium chloride  20 mEq Oral BID  . thiamine  100 mg Oral Daily   Or  . thiamine  100 mg Intravenous Daily  . DISCONTD: metronidazole  500 mg Intravenous Q6H  . DISCONTD: moxifloxacin  400 mg Intravenous Q24H   Continuous Infusions:  PRN Meds:.LORazepam  ASSESSMENT & PLAN: Principal Problem:  *Lower extremity weakness Active Problems:  Hyponatremia  Anemia of chronic disease  Abnormal LFTs  Systolic CHF, chronic   Moderate protein-calorie malnutrition  Delirium tremens -Alcohol withdrawal, patient used to drink about a fifth of vodka a day. -Last drink on 07/18/2011. -Patient started on CIWA protocol, improving.  Fever -This can be secondary to alcohol withdrawal, cannot rule out infections. Can be also secondary to transfusion. -Patient has negative urinalysis, clear chest x-ray and no evidence of skin infections. -I will start empirically on antibiotics follow the trend of the fever as well as WBCs count.  Hyponatremia -This is likely secondary  to CHF versus diuretic use. -Patient is on Lasix 40 mg IV every 12 hour, we will continue and monitor the electrolytes.  Chronic combined CHF -2-D echocardiogram showed marked improvement in his LVEF to 58% from 23%. -Continue Coreg and Lasix.  Lower extremity weakness -This is likely part of generalized weakness patient got with alcohol withdrawal. -I will check his TSH, and CPK.  Abnormal liver function tests -This is secondary to alcohol/chronic liver disease. -Liver ultrasound showed hepatomegaly with diffuse fatty infiltration. I will follow the trend of the LFTs.   Anemia -Probably secondary to chronic disease. Ferritin is in the high side and iron is 59. -Hemoglobin dropped status post transfusion of one unit of packed RBCs. -Fecal occult blood came back positive, I asked GI to see him and also to manage his liver disease as outpatient.   LOS: 5 days   [name] [name] A 07/26/2011, 4:27 PM

## 2011-07-26 NOTE — Progress Notes (Signed)
I have reviewed the above note, examined the patient and agree with plan of treatment.. Please see Sondra Come PA note. He is hemodynamically stable, ex wife at bedside, will bring old colon report from 11 years ago. We will proceed with EGD/colon this week when he is over the alcohol withdrawal.

## 2011-07-26 NOTE — Progress Notes (Signed)
Physical Therapy Treatment Patient Details Name: [name] [name] MRN: [id number] DOB: 1951-02-28 Today's Date: 07/26/2011  PT Assessment/Plan  PT - Assessment/Plan Comments on Treatment Session: Pt progessing well, but slowly. Able to initiate ambulation and LE exercises this session. Continue to recommend SNF for rehab although pt prefers to d/c home. PT Plan: Discharge plan remains appropriate Recommendations for Other Services: OT consult Follow Up Recommendations: Skilled nursing facility Equipment Recommended: Defer to next venue PT Goals  Acute Rehab PT Goals PT Goal: Supine/Side to Sit - Progress: Progressing toward goal PT Goal: Sit to Stand - Progress: Progressing toward goal PT Goal: Stand to Sit - Progress: Progressing toward goal PT Goal: Ambulate - Progress: Progressing toward goal PT Goal: Perform Home Exercise Program - Progress: Progressing toward goal  PT Treatment Precautions/Restrictions  Precautions Precautions: Fall Restrictions Weight Bearing Restrictions: No Mobility (including Balance) Bed Mobility Bed Mobility: Yes Supine to Sit: 1: +2 Total assist Supine to Sit Details (indicate cue type and reason): Pt=60%. Multimodal cues for safety, technique, hand placement. Assist for bil LEs off bed and trunk to upright. Transfers Transfers: Yes Sit to Stand: 1: +2 Total assist;From bed;With upper extremity assist Sit to Stand Details (indicate cue type and reason): Pt=70%. Multimodal cues for safety, technique, hand placement, posture. Improved static standing balance. Noted tremor with initial standing. Better use of bil UEs for support with RW.  Stand to Sit: 1: +2 Total assist;To chair/3-in-1;With armrests;With upper extremity assist Stand to Sit Details: Pt=70%. VCs safety, hand placement. Assist to control descent.  Ambulation/Gait Ambulation/Gait: Yes Ambulation/Gait Assistance: 1: +2 Total assist Ambulation/Gait Assistance Details (indicate cue type and  reason): Pt=70%. VCs safety, posture, for pt to increase step length. Assist for safety, stability, RW negotiation, follow with chair. Fatigues easily. Ambulation Distance (Feet): 20 Feet Assistive device: Rolling walker Gait Pattern: Step-through pattern;Decreased step length - left;Decreased step length - right;Decreased stride length;Trunk flexed    Exercise  General Exercises - Lower Extremity Ankle Circles/Pumps: AROM;Both;10 reps;Seated Quad Sets: AROM;Both;10 reps;Seated Hip ABduction/ADduction: AAROM;Both;10 reps;Seated Straight Leg Raises: AAROM;Both;10 reps;Seated End of Session PT - End of Session Equipment Utilized During Treatment: Gait belt Activity Tolerance: Patient limited by fatigue Patient left: in chair;with call bell in reach Nurse Communication: Mobility status for ambulation;Mobility status for transfers General Behavior During Session: [facility] for tasks performed Cognition: [facility] for tasks performed  Rebeca Alert [facility] 07/26/2011, 10:17 AM [phone]

## 2011-07-26 NOTE — Progress Notes (Signed)
Spoke with the ex-wife and who is the POA for the patient.  Insurance under [employer] does end [id number] but [employer] insurance will begin on [id number] instead of [id number] as originally thought.  Patient does stay with her at times or with the sister.  She would like for patient to go to a snf temporarily in order to get a little stronger.  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

## 2011-07-27 DIAGNOSIS — F10239 Alcohol dependence with withdrawal, unspecified: Secondary | ICD-10-CM | POA: Diagnosis present

## 2011-07-27 LAB — COMPREHENSIVE METABOLIC PANEL
CO2: 26 mEq/L (ref 19–32)
Calcium: 7.7 mg/dL — ABNORMAL LOW (ref 8.4–10.5)
Creatinine, Ser: 0.86 mg/dL (ref 0.50–1.35)
GFR calc Af Amer: >90 mL/min (ref >90)
GFR calc non Af Amer: >90 mL/min (ref >90)
Glucose, Bld: 100 mg/dL — ABNORMAL HIGH (ref 70–99)
Sodium: 139 mEq/L (ref 135–145)
Total Protein: 6.4 g/dL (ref 6.0–8.3)

## 2011-07-27 MED ORDER — CARVEDILOL 12.5 MG PO TABS: 12.5000 mg | ORAL_TABLET | Freq: Two times a day (BID) | ORAL | Status: DC

## 2011-07-27 MED ORDER — ADULT MULTIVITAMIN W/MINERALS CH: 1.0000 | ORAL_TABLET | Freq: Every day | ORAL | Status: DC

## 2011-07-27 MED ORDER — PANTOPRAZOLE SODIUM 40 MG PO TBEC: 40.0000 mg | DELAYED_RELEASE_TABLET | Freq: Two times a day (BID) | ORAL | Status: DC

## 2011-07-27 NOTE — Discharge Summary (Signed)
HOSPITAL DISCHARGE SUMMARY  [name] [name]  MRN: [id number]  DOB:[birth year]  Date of Admission: 07/21/2011 Date of Discharge: 07/27/2011         LOS: 6 days   Attending Physician:  Clydia Llano A  Patient's PCP:  Kaleen Mask, MD, MD  Consults: Treatment Team:  Hart Carwin, MD   Discharge Diagnoses: Present on Admission:  .Lower extremity weakness .Hyponatremia .Anemia of chronic disease .Abnormal LFTs .Systolic CHF, chronic .Moderate protein-calorie malnutrition .Nonspecific abnormal finding in stool contents .Alcohol withdrawal   Medication List  As of 07/27/2011 12:00 PM   STOP taking these medications         aspirin 81 MG tablet      chlordiazePOXIDE 25 MG capsule      ciprofloxacin 500 MG tablet      doxycycline 100 MG tablet      omeprazole 20 MG capsule      PRADAXA PO         TAKE these medications         allopurinol 300 MG tablet   Commonly known as: ZYLOPRIM   Take 300 mg by mouth daily.      amitriptyline 10 MG tablet   Commonly known as: ELAVIL   Take 10 mg by mouth at bedtime.      carvedilol 12.5 MG tablet   Commonly known as: COREG   Take 1 tablet (12.5 mg total) by mouth 2 (two) times daily with a meal.      furosemide 40 MG tablet   Commonly known as: LASIX   Take 40 mg by mouth daily.      megestrol 40 MG/ML suspension   Commonly known as: MEGACE   Take 800 mg by mouth daily. Take 4 teaspoons (20ml)by mouth once daily.      mulitivitamin with minerals Tabs   Take 1 tablet by mouth daily.      pantoprazole 40 MG tablet   Commonly known as: PROTONIX   Take 1 tablet (40 mg total) by mouth 2 (two) times daily.      potassium chloride 20 MEQ/15ML (10%) solution   Take 20 mEq by mouth 2 (two) times daily.             Brief Admission History: Pt is 61 year old male with history of chronic systolic CHF (EF in 2011 20-25%), HTN, alcohol abuse (last drink few days prior to admission, vodka) who presented to ED with  complaints of lower extremity weakness of 5 weeks duration. As per patient this weakness has progressively gotten worse to a point where he could not ambulate at all. In addition he reports lower extremity swelling of several weeks in duration. Patient reports being compliant with medications which includes but is not limited to lasix. Patient denies symptoms of shortness of breath or chest pain, no palpitations. No fever or chills, no lightheadedness or dizziness, loss of consciousness. No complaints of abdominal pain, blood in stool or urine. No nusea or vomiting.  Hospital Course: Present on Admission:  .Lower extremity weakness .Hyponatremia .Anemia of chronic disease .Abnormal LFTs .Systolic CHF, chronic .Moderate protein-calorie malnutrition .Nonspecific abnormal finding in stool contents .Alcohol withdrawal   1. Alcohol withdrawal: This is delirium tremens. Patient reported that he drinks a fifth of vodka day. Patient came in to the hospital with extreme weakness and confusion. At the time of presentation to the hospital last drink he drank was about 4-5 days ago and he started to have the withdrawal and shakes and  confusion. Patient placed on CIWA protocol and he did very well and he is awake and alert and oriented x2 to self and place not to time. Please note patient has mild dementia. This is resolved by now and no recent benzodiazepines.  2. Generalized weakness: This is was thought initially to be lower extremity weakness because patient couldn't walk, this is global generalized weakness secondary to his deconditioning from DTs. This is improving. Patient is walking around with physical therapy and they did recommend to go to skilled nursing facility because of ongoing weakness.  3. Anemia: Likely chronic anemia exacerbated with acute blood loss. Patient has positive fecal occult blood testing. He had one unit of packed RBCs transfused to help his hemoglobin which dropped to 7.8.  Patient is not overtly bleeding, but GI was consulted and they recommended EGD and colonoscopy to be done later during the week when he gains some strength. I discussed with the gastroenterology service that patient is going to rehabilitation and it is possible can be done as outpatient. Patient will be discharged to rehabilitation on PPI, off of that aspirin. Patient reported that he is taking PRADAXA at some point, patient said he has not taken this medication. He is to see Dr. Arlyce Dice was [place] on 09/01/2011.  4. Alcohol abuse: Patient used to drink a fifth of vodka a day, per his former wife he stopped drinking at around 07/18/2011 and that is probably why he came in to the hospital with symptoms of withdrawal. Patient counseled extensively. His liver function tests still okay with normal INR, his AST/ALT ratio suggests chronic alcohol drinking.  5. Chronic systolic CHF: This is improved very well, patient echocardiogram showed ejection fraction of 58% on 2-D echocardiogram done on 07/23/2011. Please note that his LVEF was about 20% on echo done on August 2011. Patient was tachycardic so his Coreg increased to 12.5 twice a day. And he is continued on his Lasix 40 mg.  6. Fever: Patient have fever of 101.1 the day of admission. Patient chest x-ray was clear, his urinalysis was clean. This is might be secondary to mild reaction due to the blood transfusion or simply secondary to alcohol withdrawal. Patient was empirically covered by Avelox and Flagyl for 5 days. He is afebrile now.  Day of Discharge BP 124/81  Pulse 87  Temp(Src) 97.7 F (36.5 C) (Oral)  Resp 20  Ht 5\' 11"  (1.803 m)  Wt 87.2 kg (192 lb 3.9 oz)  BMI 26.81 kg/m2  SpO2 98% Physical Exam: GEN: No acute distress, cooperative with exam PSYCH: alert and oriented x4; does not appear anxious does not appear depressed; affect is normal  HEENT: Mucous membranes pink and anicteric;  Mouth: without oral thrush or  lesions Eyes: PERRLA; EOM intact;  Neck: no cervical lymphadenopathy nor thyromegaly or carotid bruit; no JVD;  CHEST WALL: No tenderness, symmetrical to breathing bilaterally CHEST: Normal respiration, clear to auscultation bilaterally  HEART: Regular rate and rhythm; no murmurs, rubs or gallops, S1 and S2 heard  BACK: No kyphosis or scoliosis; no CVA tenderness  ABDOMEN:  soft non-tender; no masses, no organomegaly, normal abdominal bowel sounds; no pannus; no intertriginous candida.  EXTREMITIES: No bone or joint deformity; no edema; no ulcerations.  PULSES: 2+ and symmetric, neurovascularity is intact SKIN: Normal hydration no rash or ulceration, no flushing or suspicious lesions  CNS: Cranial nerves 2-12 grossly intact no focal neurologic deficit, coordination is intact gait not tested    Results for orders placed during  the hospital encounter of 07/21/11 (from the past 24 hour(s))  COMPREHENSIVE METABOLIC PANEL     Status: Abnormal   Collection Time   07/27/11  4:55 AM      Component Value Range   Sodium 139  135 - 145 (mEq/L)   Potassium 3.5  3.5 - 5.1 (mEq/L)   Chloride 105  96 - 112 (mEq/L)   CO2 26  19 - 32 (mEq/L)   Glucose, Bld 100 (*) 70 - 99 (mg/dL)   BUN 19  6 - 23 (mg/dL)   Creatinine, Ser 1.61  0.50 - 1.35 (mg/dL)   Calcium 7.7 (*) 8.4 - 10.5 (mg/dL)   Total Protein 6.4  6.0 - 8.3 (g/dL)   Albumin 2.0 (*) 3.5 - 5.2 (g/dL)   AST 096 (*) 0 - 37 (U/L)   ALT 19  0 - 53 (U/L)   Alkaline Phosphatase 197 (*) 39 - 117 (U/L)   Total Bilirubin 1.0  0.3 - 1.2 (mg/dL)   GFR calc non Af Amer >90  >90 (mL/min)   GFR calc Af Amer >90  >90 (mL/min)  GLUCOSE, CAPILLARY     Status: Normal   Collection Time   07/27/11  7:35 AM      Component Value Range   Glucose-Capillary 89  70 - 99 (mg/dL)    Disposition: SNF   Follow-up Appts: Discharge Orders    Future Appointments: Provider: Department: Dept Phone: Center:   09/01/2011 10:15 AM Louis Meckel, MD [facility]  Office [phone] [facility]     Future Orders Please Complete By Expires   Diet - low sodium heart healthy      Increase activity slowly         Follow-up Information    Follow up with Melvia Heaps, MD on 09/01/2011. (at 10:15 am)    Contact information:   [address] [facility] [address] [place] [address] [place] [address] [phone]       Follow up with Kaleen Mask, MD in 1 week.   Contact information:   [address] [place] [address] [phone]          I spent 40 minutes completing paperwork and coordinating discharge efforts.  SignedClydia Llano A 07/27/2011, 12:00 PM

## 2011-07-27 NOTE — Progress Notes (Signed)
I have reviewed the above note , although it would be preferable to complete the GI work up in the hospital, I hope his ex wife will be able to take him to see Dr Arlyce Dice for colonoscopy as an outpatient

## 2011-07-27 NOTE — Progress Notes (Addendum)
Physical Therapy Treatment Patient Details Name: [name] [name] MRN: [id number] DOB: Oct 17, 1950 Today's Date: 07/27/2011  PT Assessment/Plan  PT - Assessment/Plan Comments on Treatment Session: Pt able to increase ambulation distance however continues to fatigue quickly requiring frequent seated rest breaks.  Pt too fatigued to perform exercises but reviewed ankle pumps and quad sets for later. PT Plan: Discharge plan remains appropriate;Frequency remains appropriate Follow Up Recommendations: Skilled nursing facility Equipment Recommended: Defer to next venue PT Goals  Acute Rehab PT Goals PT Goal: Supine/Side to Sit - Progress: Progressing toward goal Pt will go Sit to Stand: with supervision PT Goal: Sit to Stand - Progress: Updated due to goal met Pt will go Stand to Sit: with supervision PT Goal: Stand to Sit - Progress: Updated due to goals met Pt will Transfer Bed to Chair/Chair to Bed: with supervision PT Transfer Goal: Bed to Chair/Chair to Bed - Progress: Updated due to goal met Pt will Ambulate: 51 - 150 feet;with supervision;with least restrictive assistive device;Other (comment) (one standing rest break) PT Goal: Ambulate - Progress: Updated due to goal met  PT Treatment Precautions/Restrictions  Precautions Precautions: Fall Restrictions Weight Bearing Restrictions: No Mobility (including Balance) Bed Mobility Bed Mobility: Yes Supine to Sit: 3: Mod assist;HOB elevated (Comment degrees) Supine to Sit Details (indicate cue type and reason): verbal cues for safe technique, assist for LEs off bed and with bringing trunk upright Transfers Transfers: Yes Sit to Stand: 4: Min assist;From chair/3-in-1;From bed;With upper extremity assist Sit to Stand Details (indicate cue type and reason): verbal cues for safe technique and posture Stand to Sit: 4: Min assist;With upper extremity assist;To chair/3-in-1 Stand to Sit Details: assist to control descent, verbal cues for hand  placement Ambulation/Gait Ambulation/Gait: Yes Ambulation/Gait Assistance: 4: Min assist Ambulation/Gait Assistance Details (indicate cue type and reason): +2 to follow with chair, pt fatigues quickly, verbal cues for safety with RW and posture, pt required seated rest breaks between 40 feet x2, tremoring present in all extremities. Ambulation Distance (Feet): 80 Feet (total) Assistive device: Rolling walker Gait Pattern: Step-through pattern;Decreased stride length;Trunk flexed Gait velocity: decreased    Exercise    End of Session PT - End of Session Equipment Utilized During Treatment: Gait belt Activity Tolerance: Patient limited by fatigue Patient left: in chair;with call bell in reach General Behavior During Session: [facility] for tasks performed Cognition: [facility] for tasks performed  Saryiah Bencosme,KATHrine E 07/27/2011, 12:40 PM Pager: [phone]

## 2011-07-27 NOTE — Progress Notes (Signed)
Patient ID: [name] [name], male   DOB: 08/13/1950, 61 y.o.   MRN: [id number]  No new labs, pt out in  hallway with PT. Will see in am tomorrow, and consider Colon/EGD Friday.    Spoke to Dr Arthor Captain- pt may get a rehab bed today. Will plan outpt follow up with Dr. Arlyce Dice on 5/1 /2013 at 10:15 am

## 2011-07-28 LAB — GLUCOSE, CAPILLARY: Glucose-Capillary: 89 mg/dL (ref 70–99)

## 2011-07-28 NOTE — Plan of Care (Signed)
Problem: Discharge Progression Outcomes Goal: Discharge plan in place and appropriate Outcome: Completed/Met Date Met:  07/28/11 Pt will be d/c to SNF

## 2011-07-28 NOTE — Progress Notes (Signed)
Patient briefly seen and examined. Came in with lower extremity weakness thought 2/2 DTs. Improved. Is scheduled to go to SNF and was in fact discharged yesterday, however there seems to be a lapse in his insurance coverage requiring that he stay in the hospital until tomorrow. Will plan for DC SNF in am as long as patient remains clinically stable.  Peggye Pitt, MD [facility] Pager: [phone]

## 2011-07-28 NOTE — Progress Notes (Deleted)
CARE MANAGE MENT UTILIZATION REVIEW NOTE 07/28/2011     Patient:  [name] [name]   Account Number:  [id number]  Documented by:  Bjorn Loser Darenda Fike   Per Ur Regulation Reviewed for med. necessity/level of care/duration of stay

## 2011-07-28 NOTE — Progress Notes (Signed)
Patients family would like [facility]. Patients family is appealing for patients insurance to begin today as opposed to April 1st. [facility] has to go through the prior [place] process. CSW faxed [facility] notes to [facility] for them to attempt to get auth.  Valincia Touch C. Najah Liverman MSW, LCSW [phone]

## 2011-07-28 NOTE — Progress Notes (Signed)
   CARE MANAGEMENT NOTE 07/28/2011  Patient:  RUPERTO, KIERNAN   Account Number:  [id number]  Date Initiated:  07/23/2011  Documentation initiated by:  DAVIS,RHONDA  Subjective/Objective Assessment:   pt with confusion hgb 7.8 on admission and hypotensive     Action/Plan:   lives at home   Anticipated DC Date:  07/28/2011   Anticipated DC Plan:  SKILLED NURSING FACILITY  In-house referral  Clinical Social Worker      DC Planning Services  NA  NA      [facility] Choice  NA   Choice offered to / List presented to:  NA   DME arranged  NA      DME agency  NA     HH arranged  NA      HH agency  NA   Status of service:  Completed, signed off Medicare Important Message given?  NA - LOS <3 / Initial given by admissions (If response is "NO", the following Medicare IM given date fields will be blank) Date Medicare IM given:   Date Additional Medicare IM given:    Discharge Disposition:  SKILLED NURSING FACILITY  Per UR Regulation:  Reviewed for med. necessity/level of care/duration of stay  If discussed at Long Length of Stay Meetings, dates discussed:    Comments:  07/28/11 [facility] RN,BSN NCM [phone] D/C SNF.  [id number] Davis,RN,BSN,CCM

## 2011-07-29 LAB — GLUCOSE, CAPILLARY: Glucose-Capillary: 192 mg/dL — ABNORMAL HIGH (ref 70–99)

## 2011-07-29 NOTE — Discharge Summary (Signed)
This is an addendum to Discharge Summary done by Dr. Arthor Captain on 3/26. Patient had to stay in the hospital an extra 2 days due to a lapse in insurance coverage making D/C to SNF impossible. No acute events over the past 48 hours. Patient has been deemed stable for discharge to SNF today. No changes to prior DC Summary required.  Peggye Pitt, MD [facility] Pager: [phone]

## 2011-07-29 NOTE — Progress Notes (Signed)
Patient cleared for discharge. Patient accepted at [facility]. auth obtained. Packet copied and placed in [place]. ptar called for transportation. Patients spouse informed.  Zelina Jimerson C. Treyce Spillers MSW, LCSW [phone]

## 2011-08-04 NOTE — H&P (Signed)
Patient ID: [name] [name], male DOB: 10/13/1950, 61 y.o. MRN: [id number]  PCP: Kaleen Mask, MD, MD  DOA: 07/21/2011 4:02 PM  Chief Complaint:  Leg weakness  HPI:  Pt is 61 year old male with history of chronic systolic CHF (EF in 2011 20-25%), HTN, alcohol abuse (last drink few days prior to admission, vodka) who presented to ED with complaints of lower extremity weakness of 5 weeks duration. As per patient this weakness has progressively gotten worse to a point where he could not ambulate at all. In addition he reports lower extremity swelling of several weeks in duration. Patient reports being compliant with medications which includes but is not limited to lasix. Patient denies symptoms of shortness of breath or chest pain, no palpitations. No fever or chills, no lightheadedness or dizziness, loss of consciousness. No complaints of abdominal pain, blood in stool or urine. No nusea or vomiting.  Assessment/Plan:  Principal Problem:  *LOWER EXTREMITY WEAKNESS  - unclear etiology, perhaps secondary to alcohol abuse, questionable vit B12 deficiency  - alcohol level < 15  - we will order CIWA protocol just in case pt has symptoms of withdrawals; however at present he does not appear to be acutely intoxicated or acutely withdrawing  - follow up urine drug screen  - PT evaluation  Active Problems:  HYPONATREMIA  - secondary to dehydration or alcohol abuse versus chronic CHF versus diuretic use  - patient is fluid overloaded with (+3) LE extremity edema  - start lasix 40 mg IV Q 12 hours  - reassess clinical status in AM  - follow up BMP in am  CHF, SYSTOLIC, CHRONIC  - based on 2 D ECHO in 2011, severely reduced EF 20-25%  - obtain 2 D ECHO on this admission  - cycle cardiac enzymes, check TSH, Hgb A1c, lipid panel  - check BNP level  - lasix 40 mg Q 12 hours IV  - daily weight  - strict I&Os  - correct electrolytes  ABNORMAL LIVER FUNCTION TESTS  - likely secondary to alcohol /  cholestasis  - obtain liver ultrasound  - trend LFTs  ANEMIA  - possibly related to chronic disease/ bone marrow suppression  - check iron profile, ferritin, B12  - Hgb dropped from 8 to 7.2  - check FOBT  - hold pradaxa  - transfuse 1 unit PRBC now  - CBC post transfusion  MODERATE PROTEIN CALORIE MALNUTRITION  - nutrition consult  DIET  - heart healthy  DVT PROPHYLAXIS  - SCD bilaterally  DISPOSITION  - to telemetry floor  Allergies:  Allergies   Allergen  Reactions   .  Other  Hives     "About 3/4 of all antibiotics." Does not remember which ones he can take.    Prior to Admission medications   Medication  Sig  Start Date  End Date  Taking?  Authorizing Provider   Dabigatran Etexilate Mesylate (PRADAXA PO)  Take by mouth.    Yes  Historical Provider, MD   furosemide (LASIX) 40 MG tablet  Take 40 mg by mouth daily.    Yes  Historical Provider, MD   megestrol (MEGACE) 40 MG/ML suspension  Take 800 mg by mouth daily. Take 4 teaspoons (71ml)by mouth once daily.    Yes  Historical Provider, MD   potassium chloride 20 MEQ/15ML (10%) solution  Take 20 mEq by mouth 2 (two) times daily.    Yes  Historical Provider, MD    Past Medical History   Diagnosis  Date   .  CHF (congestive heart failure)    .  Hypertension    .  GERD (gastroesophageal reflux disease)     Past Surgical History   Procedure  Date   .  Appendectomy    .  Throat surgery      surgery to help with snoring   .  Shoulder open rotator cuff repair      2 on right shoulder, 1 on left shoulder    Social History:  reports that he quit smoking about 28 years ago. He has never used smokeless tobacco. He reports that he drinks alcohol. He reports that he does not use illicit drugs.  History reviewed. No pertinent family history.  Review of Systems:  Constitutional: Denies fever, chills, diaphoresis, appetite change and fatigue.  HEENT: Denies photophobia, eye pain, redness, hearing loss, ear pain, congestion, sore  throat, rhinorrhea, sneezing, mouth sores, trouble swallowing, neck pain, neck stiffness and tinnitus.  Respiratory: occasional SOB, no DOE, no cough, no chest tightness, and wheezing.  Cardiovascular: Denies chest pain, palpitations and leg swelling.  Gastrointestinal: Denies nausea, vomiting, abdominal pain, diarrhea, constipation, blood in stool and abdominal distention.  Genitourinary: Denies dysuria, urgency, frequency, hematuria, flank pain and difficulty urinating.  Musculoskeletal: Denies myalgias, back pain, joint swelling, arthralgias and gait problem.  Skin: Denies pallor, rash and wound.  Neurological: Denies dizziness, seizures, syncope, light-headedness, numbness and headaches; does report lower extremity weakness.  Hematological: Denies adenopathy. Easy bruising, personal or family bleeding history  Psychiatric/Behavioral: Denies suicidal ideation, mood changes, confusion, nervousness, sleep disturbance and agitation  Physical Exam:  Filed Vitals:    07/21/11 1439  07/21/11 1605  07/21/11 1917   BP:  100/79  104/72  117/75   Pulse:   104  97   Temp:   98 F (36.7 C)    TempSrc:   Oral    Resp:  16  16    SpO2:  95%  98%  99%    Constitutional: Vital signs reviewed. Patient is in no acute distress and cooperative with exam. Alert and oriented x3.  Head: Normocephalic and atraumatic  Ear: TM normal bilaterally  Mouth: no erythema or exudates, dry MM  Eyes: PERRL, EOMI, conjunctivae normal, No scleral icterus; pale skin.  Neck: Supple, Trachea midline normal ROM, (+) JVD, no mass, thyromegaly, or carotid bruit present.  Cardiovascular: RRR, S1 normal, S2 normal, no MRG, pulses symmetric and intact bilaterally  Pulmonary/Chest: CTAB, no wheezes, rales, or rhonchi; diminished breath sounds bilaterally  Abdominal: Soft. Non-tender, non-distended, bowel sounds are normal, no masses, organomegaly, or guarding present.  GU: no CVA tenderness Musculoskeletal: No joint deformities,  erythema, or stiffness, ROM full and no nontender Ext: (+3) lower extremity edema; pulses palpable bilaterally, no rash and no cyanosis  Hematology: no cervical, inginal, or axillary adenopathy.  Neurological: A&O x3, Strenght is normal and symmetric bilaterally, cranial nerve II-XII are grossly intact, no focal motor deficit, sensory intact to light touch bilaterally.  Skin: Warm, dry and intact. No rash, cyanosis, or clubbing.  Psychiatric: Normal mood and affect. speech and behavior is normal. Judgment and thought content normal. Cognition and memory are normal.  Labs on Admission:  Results for orders placed during the hospital encounter of 07/21/11 (from the past 48 hour(s))   CBC Status: Abnormal    Collection Time    07/21/11 5:23 PM   Component  Value  Range  Comment    WBC  9.2  4.0 - 10.5 (K/uL)  RBC  2.47 (*)  4.22 - 5.81 (MIL/uL)     Hemoglobin  8.0 (*)  13.0 - 17.0 (g/dL)     HCT  56.2 (*)  13.0 - 52.0 (%)     MCV  95.5  78.0 - 100.0 (fL)     MCH  32.4  26.0 - 34.0 (pg)     MCHC  33.9  30.0 - 36.0 (g/dL)     RDW  86.5 (*)  78.4 - 15.5 (%)     Platelets  264  150 - 400 (K/uL)    COMPREHENSIVE METABOLIC PANEL Status: Abnormal    Collection Time    07/21/11 5:23 PM   Component  Value  Range  Comment    Sodium  125 (*)  135 - 145 (mEq/L)     Potassium  3.7  3.5 - 5.1 (mEq/L)     Chloride  86 (*)  96 - 112 (mEq/L)     CO2  25  19 - 32 (mEq/L)     Glucose, Bld  115 (*)  70 - 99 (mg/dL)     BUN  15  6 - 23 (mg/dL)     Creatinine, Ser  6.96  0.50 - 1.35 (mg/dL)     Calcium  8.2 (*)  8.4 - 10.5 (mg/dL)     Total Protein  7.6  6.0 - 8.3 (g/dL)     Albumin  2.3 (*)  3.5 - 5.2 (g/dL)     AST  72 (*)  0 - 37 (U/L)     ALT  17  0 - 53 (U/L)     Alkaline Phosphatase  249 (*)  39 - 117 (U/L)     Total Bilirubin  2.1 (*)  0.3 - 1.2 (mg/dL)     GFR calc non Af Amer  89 (*)  >90 (mL/min)     GFR calc Af Amer  >90  >90 (mL/min)    ETHANOL Status: Normal    Collection Time     07/21/11 5:23 PM   Component  Value  Range  Comment    Alcohol, Ethyl (B)  <11  0 - 11 (mg/dL)    ACETAMINOPHEN LEVEL Status: Normal    Collection Time    07/21/11 5:23 PM   Component  Value  Range  Comment    Acetaminophen (Tylenol), Serum  <15.0  10 - 30 (ug/mL)     Time Spent on Admission:  Greater than 30 minutes  Ramona Slinger  07/21/2011, 8:22 PM  [facility]  [phone]

## 2011-08-06 ENCOUNTER — Encounter: Payer: Self-pay | Admitting: *Deleted

## 2011-09-01 ENCOUNTER — Ambulatory Visit: Payer: 59 | Admitting: Gastroenterology

## 2011-09-28 ENCOUNTER — Ambulatory Visit: Payer: Self-pay | Admitting: Gastroenterology

## 2011-10-29 ENCOUNTER — Ambulatory Visit (INDEPENDENT_AMBULATORY_CARE_PROVIDER_SITE_OTHER): Payer: PRIVATE HEALTH INSURANCE | Admitting: Gastroenterology

## 2011-10-29 ENCOUNTER — Other Ambulatory Visit (INDEPENDENT_AMBULATORY_CARE_PROVIDER_SITE_OTHER): Payer: PRIVATE HEALTH INSURANCE

## 2011-10-29 ENCOUNTER — Encounter: Payer: Self-pay | Admitting: Gastroenterology

## 2011-10-29 VITALS — BP 142/70 | HR 80 | Ht 66.0 in | Wt 185.0 lb

## 2011-10-29 DIAGNOSIS — Z860100 Personal history of colon polyps, unspecified: Secondary | ICD-10-CM

## 2011-10-29 DIAGNOSIS — R195 Other fecal abnormalities: Secondary | ICD-10-CM

## 2011-10-29 DIAGNOSIS — K7460 Unspecified cirrhosis of liver: Secondary | ICD-10-CM

## 2011-10-29 LAB — CBC WITH DIFFERENTIAL/PLATELET
Basophils Absolute: 0.1 10*3/uL (ref 0.0–0.1)
Eosinophils Relative: 4.6 % (ref 0.0–5.0)
Lymphocytes Relative: 25.9 % (ref 12.0–46.0)
Monocytes Relative: 11.6 % (ref 3.0–12.0)
Neutrophils Relative %: 56.8 % (ref 43.0–77.0)
Platelets: 154 10*3/uL (ref 150.0–400.0)
RDW: 14.9 % — ABNORMAL HIGH (ref 11.5–14.6)
WBC: 5 10*3/uL (ref 4.5–10.5)

## 2011-10-29 MED ORDER — PEG-KCL-NACL-NASULF-NA ASC-C 100 G PO SOLR: 1.0000 | Freq: Once | ORAL | Status: DC

## 2011-10-29 NOTE — Patient Instructions (Addendum)
Your have been scheduled for a colonoscopy/endoscopy on 12/09/2011 at 11am Separate instructions have been given

## 2011-10-29 NOTE — Assessment & Plan Note (Signed)
Plan followup colonoscopy 

## 2011-10-29 NOTE — Assessment & Plan Note (Signed)
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  Recommendations #1 upper endoscopy to rule out varices #2 review prior blood work. If hepatitis serologies have not been drawn and an alpha-fetoprotein has not been checked I will obtain these labs

## 2011-10-29 NOTE — Assessment & Plan Note (Signed)
Heme positive stools in March, 2013. This could be do to alcoholic gastritis, active peptic ulcer disease, recurrent colon polyps.  Recommendations #1 colonoscopy and upper endoscopy #2 if negative follow obtain followup Hemoccults

## 2011-10-29 NOTE — Progress Notes (Signed)
History of Present Illness: [name] is a 61 year old white male referred at the request of Dr. Jeannetta Nap for evaluation of liver disease. He has a histiry of  alcohol abuse and was hospitalized in March, 2013 for  weakness and inability to ambulate. Hemoccult-positive stool was noted. He was anemic. Plans for upper endoscopy and colonoscopy were made but these were not ultimately done. He has stopped drinking since this admission. Ultrasound in March, which I reviewed, demonstrated significant hepatomegaly and mild splenomegaly.  A normocytic normochromic anemia was noted with a hemoglobin about 8. He currently has no GI complaints including abdominal pain, change in bowel habits, melena or hematochezia.  He was told by his family physician that recent lab work was normal.  Adenomatous polyps were removed in 2006.    Past Medical History  Diagnosis Date  . CHF (congestive heart failure)   . Hypertension   . GERD (gastroesophageal reflux disease)   . Alcohol abuse   . Anemia    Past Surgical History  Procedure Date  . Appendectomy   . Throat surgery     surgery to help with snoring  . Shoulder open rotator cuff repair     2 on right shoulder, 1 on left shoulder   family history includes Diabetes in his father and mother and Heart disease in his father and mother.  There is no history of Colon cancer. Current Outpatient Prescriptions  Medication Sig Dispense Refill  . allopurinol (ZYLOPRIM) 300 MG tablet Take 300 mg by mouth daily.      Marland Kitchen amitriptyline (ELAVIL) 10 MG tablet Take 10 mg by mouth at bedtime.      . carvedilol (COREG) 6.25 MG tablet Take 6.25 mg by mouth daily.      . furosemide (LASIX) 40 MG tablet Take 40 mg by mouth daily.      . megestrol (MEGACE) 40 MG/ML suspension Take 800 mg by mouth daily. Take 4 teaspoons (9ml)by mouth once daily.      . Multiple Vitamin (MULITIVITAMIN WITH MINERALS) TABS Take 1 tablet by mouth daily.      Marland Kitchen omeprazole (PRILOSEC) 20 MG capsule Take  20 mg by mouth every other day.      . potassium chloride 20 MEQ/15ML (10%) solution Take 20 mEq by mouth 2 (two) times daily.      . traMADol (ULTRAM) 50 MG tablet Take 50 mg by mouth every 6 (six) hours as needed.      Marland Kitchen DISCONTD: carvedilol (COREG) 12.5 MG tablet Take 1 tablet (12.5 mg total) by mouth 2 (two) times daily with a meal.       Allergies as of 10/29/2011 - Review Complete 10/29/2011  Allergen Reaction Noted  . Adhesive (tape)  07/21/2011  . Amlodipine  10/29/2011  . Amoxicillin  10/29/2011  . Antiseptic products, misc.  07/21/2011  . Atenolol  10/29/2011  . Celebrex (celecoxib)  07/21/2011  . Cephalosporins  07/21/2011  . Clindamycin/lincomycin  07/21/2011  . Dexon (dexamethasone sodium phosphate)  07/21/2011  . Doxazosin mesylate  10/29/2011  . Duraprep (antiseptic products, misc.)  10/29/2011  . Enalapril  10/29/2011  . Erythromycin  07/21/2011  . Hctz (hydrochlorothiazide)  07/21/2011  . Latex  07/21/2011  . Lipitor (atorvastatin)  10/29/2011  . Other  07/21/2011  . Toprol xl (metoprolol succinate)  07/21/2011  . Verapamil  10/29/2011  . Vioxx (rofecoxib)  10/29/2011  . Zocor (simvastatin)  07/21/2011    reports that he quit smoking about 28 years ago. He has  never used smokeless tobacco. He reports that he drinks alcohol. He reports that he does not use illicit drugs.     Review of Systems: He has frequent low back pain Pertinent positive and negative review of systems were noted in the above HPI section. All other review of systems were otherwise negative.  Vital signs were reviewed in today's medical record Physical Exam: General: Well developed , well nourished, no acute distress Head: Normocephalic and atraumatic; there are a few telangiectasias on his nose Eyes:  sclerae anicteric, EOMI Ears: Normal auditory acuity Mouth: No deformity or lesions Neck: Supple, no masses or thyromegaly Lungs: Clear throughout to auscultation Heart: Regular rate and  rhythm; no murmurs, rubs or bruits Abdomen: Soft, non tender and non distended. No masses. There is a reducible umbilical hernia. Liver is palpable to the right iliac crest. Apgars at least 15 cm. . Normal Bowel sounds Rectal:deferred Musculoskeletal: Symmetrical with no gross deformities  Skin: No lesions on visible extremities Pulses:  Normal pulses noted Extremities: No clubbing, cyanosis,  or deformities noted. There is trace pedal edema Neurological: Alert oriented x 4, grossly nonfocal Cervical Nodes:  No significant cervical adenopathy Inguinal Nodes: No significant inguinal adenopathy Psychological:  Alert and cooperative. Normal mood and affect

## 2011-10-30 LAB — HEPATITIS B SURFACE ANTIBODY,QUALITATIVE: Hep B S Ab: NEGATIVE

## 2011-10-30 LAB — HEPATITIS C ANTIBODY: HCV Ab: NEGATIVE

## 2011-10-30 LAB — AFP TUMOR MARKER: AFP-Tumor Marker: <1.3 ng/mL (ref 0.0–8.0)

## 2011-10-30 LAB — HEPATITIS A ANTIBODY, TOTAL: Hep A Total Ab: POSITIVE — AB

## 2011-10-30 LAB — HEPATITIS B SURFACE ANTIGEN: Hepatitis B Surface Ag: NEGATIVE

## 2011-11-01 HISTORY — PX: MOUTH SURGERY: SHX715

## 2011-11-02 ENCOUNTER — Encounter: Payer: Self-pay | Admitting: Gastroenterology

## 2011-11-09 ENCOUNTER — Telehealth: Payer: Self-pay | Admitting: Gastroenterology

## 2011-11-09 NOTE — Telephone Encounter (Signed)
Left message for pt to call back.  Spoke with wife and she is aware.

## 2011-11-09 NOTE — Telephone Encounter (Signed)
yes

## 2011-11-09 NOTE — Telephone Encounter (Signed)
Pt was place on Meloxicam yesterday for back pain. Pt wants to know if it is ok to take this up until the day of the colon. Please advise.

## 2011-12-08 ENCOUNTER — Telehealth: Payer: Self-pay | Admitting: Gastroenterology

## 2011-12-08 NOTE — Telephone Encounter (Signed)
Dr. Arlyce Dice, I just wanted to make sure this is ok with you.  The pt drank his first dose at 5 a.m. Instead of 5 p.m.  Is it ok to tell him to stay on his clear liquids like he was directed to and take his a.m. Prep as directed?  Thanks, [employer]

## 2011-12-08 NOTE — Telephone Encounter (Signed)
Pt called and informed to drink clear liquids today and drink his prep as directed tomorrow.  Understanding voiced

## 2011-12-08 NOTE — Telephone Encounter (Signed)
yes

## 2011-12-09 ENCOUNTER — Other Ambulatory Visit (INDEPENDENT_AMBULATORY_CARE_PROVIDER_SITE_OTHER): Payer: PRIVATE HEALTH INSURANCE

## 2011-12-09 ENCOUNTER — Other Ambulatory Visit: Payer: Self-pay | Admitting: Gastroenterology

## 2011-12-09 ENCOUNTER — Encounter: Payer: Self-pay | Admitting: Gastroenterology

## 2011-12-09 ENCOUNTER — Ambulatory Visit (AMBULATORY_SURGERY_CENTER): Payer: PRIVATE HEALTH INSURANCE | Admitting: Gastroenterology

## 2011-12-09 VITALS — BP 162/95 | HR 94 | Temp 98.4°F | Resp 20 | Ht 66.0 in | Wt 185.0 lb

## 2011-12-09 DIAGNOSIS — R195 Other fecal abnormalities: Secondary | ICD-10-CM

## 2011-12-09 DIAGNOSIS — D649 Anemia, unspecified: Secondary | ICD-10-CM

## 2011-12-09 DIAGNOSIS — D131 Benign neoplasm of stomach: Secondary | ICD-10-CM

## 2011-12-09 DIAGNOSIS — I8500 Esophageal varices without bleeding: Secondary | ICD-10-CM

## 2011-12-09 DIAGNOSIS — D126 Benign neoplasm of colon, unspecified: Secondary | ICD-10-CM

## 2011-12-09 DIAGNOSIS — K7460 Unspecified cirrhosis of liver: Secondary | ICD-10-CM

## 2011-12-09 DIAGNOSIS — Z86010 Personal history of colonic polyps: Secondary | ICD-10-CM

## 2011-12-09 LAB — CBC WITH DIFFERENTIAL/PLATELET
Eosinophils Relative: 2.9 % (ref 0.0–5.0)
HCT: 33 % — ABNORMAL LOW (ref 39.0–52.0)
Lymphs Abs: 1.3 10*3/uL (ref 0.7–4.0)
MCV: 93.4 fl (ref 78.0–100.0)
Monocytes Absolute: 1.1 10*3/uL — ABNORMAL HIGH (ref 0.1–1.0)
Platelets: 144 10*3/uL — ABNORMAL LOW (ref 150.0–400.0)
RDW: 15.2 % — ABNORMAL HIGH (ref 11.5–14.6)
WBC: 8 10*3/uL (ref 4.5–10.5)

## 2011-12-09 MED ORDER — NADOLOL 40 MG PO TABS: 40.0000 mg | ORAL_TABLET | Freq: Every day | ORAL | Status: DC

## 2011-12-09 MED ORDER — SODIUM CHLORIDE 0.9 % IV SOLN: 500.0000 mL | INTRAVENOUS | Status: DC

## 2011-12-09 NOTE — Op Note (Signed)
[facility] [address] [place], [place]  [address]  COLONOSCOPY PROCEDURE REPORT  PATIENT:  [name], [name]  MR#:  [id number] BIRTHDATE:  [birth year], 61 yrs. old  GENDER:  male ENDOSCOPIST:  Barbette Hair. Arlyce Dice, MD REF. BY:  Windle Guard, M.D. PROCEDURE DATE:  12/09/2011 PROCEDURE:  Colonoscopy with snare polypectomy; submucosal injection ASA CLASS:  Class III INDICATIONS:  heme positive stool, history of pre-cancerous (adenomatous) colon polyps Index polypectomy 2006 MEDICATIONS:   MAC sedation, administered by CRNA propofol 380mg IV  DESCRIPTION OF PROCEDURE:   After the risks benefits and alternatives of the procedure were thoroughly explained, informed consent was obtained.  Digital rectal exam was performed and revealed no abnormalities.   The LB [id number] endoscope was introduced through the anus and advanced to the cecum, which was identified by both the appendix and ileocecal valve, without limitations.  The quality of the prep was good, using MoviPrep. The instrument was then slowly withdrawn as the colon was fully examined. <<PROCEDUREIMAGES>>  FINDINGS:  There were multiple polyps identified and removed. 4 polyps in transverse colon ranging from 10-53mm. The 18mm polyp was removed with hot polypectomy snare. Area was marked with spot. Other polyps were removed with cold polypectomy snare, submitted to pathology (see image3, image4, image5, and image6).  Two polyps were found in the descending colon. 2 8-10mm sessile polyps were removed with hot polypectomy snare, submitted to pathology (see image8 and image9).  Mild diverticulosis was found in the sigmoid colon (see image11).  This was otherwise a normal examination of the colon (see image2 and image12).   Retroflexed views in the rectum revealed no abnormalities.    The time to cecum =  1) 1.75 minutes. The scope was then withdrawn in  1) 21.0  minutes from the cecum and the procedure  completed. COMPLICATIONS:  None ENDOSCOPIC IMPRESSION:Colonic polyposis  RECOMMENDATIONS:Colonoscopy in 1 year because of multiplicity of polyps  REPEAT EXAM:  In 1 year(s) for Colonoscopy.  ______________________________ Barbette Hair. Arlyce Dice, MD  CC:  n. eSIGNED:   Barbette Hair. Lizvet Chunn at 12/09/2011 11:27 AM  Isaac Bliss, [phone]

## 2011-12-09 NOTE — Progress Notes (Signed)
Patient did not experience any of the following events: a burn prior to discharge; a fall within the facility; wrong site/side/patient/procedure/implant event; or a hospital transfer or hospital admission upon discharge from the facility. (G8907) Patient did not have preoperative order for IV antibiotic SSI prophylaxis. (G8918)  

## 2011-12-09 NOTE — Patient Instructions (Addendum)
YOU HAD AN ENDOSCOPIC PROCEDURE TODAY AT THE [facility]: Refer to the procedure report that was given to you for any specific questions about what was found during the examination.  If the procedure report does not answer your questions, please call your gastroenterologist to clarify.  If you requested that your care partner not be given the details of your procedure findings, then the procedure report has been included in a sealed envelope for you to review at your convenience later.  YOU SHOULD EXPECT: Some feelings of bloating in the abdomen. Passage of more gas than usual.  Walking can help get rid of the air that was put into your GI tract during the procedure and reduce the bloating. If you had a lower endoscopy (such as a colonoscopy or flexible sigmoidoscopy) you may notice spotting of blood in your stool or on the toilet paper. If you underwent a bowel prep for your procedure, then you may not have a normal bowel movement for a few days.  DIET: Your first meal following the procedure should be a light meal and then it is ok to progress to your normal diet.  A half-sandwich or bowl of soup is an example of a good first meal.  Heavy or fried foods are harder to digest and may make you feel nauseous or bloated.  Likewise meals heavy in dairy and vegetables can cause extra gas to form and this can also increase the bloating.  Drink plenty of fluids but you should avoid alcoholic beverages for 24 hours.  ACTIVITY: Your care partner should take you home directly after the procedure.  You should plan to take it easy, moving slowly for the rest of the day.  You can resume normal activity the day after the procedure however you should NOT DRIVE or use heavy machinery for 24 hours (because of the sedation medicines used during the test).    SYMPTOMS TO REPORT IMMEDIATELY: A gastroenterologist can be reached at any hour.  During normal business hours, 8:30 AM to 5:00 PM Monday through Friday,  call [phone].  After hours and on weekends, please call the GI answering service at [phone] who will take a message and have the physician on call contact you.   Following lower endoscopy (colonoscopy or flexible sigmoidoscopy):  Excessive amounts of blood in the stool  Significant tenderness or worsening of abdominal pains  Swelling of the abdomen that is new, acute  Fever of 100F or higher  Following upper endoscopy (EGD)  Vomiting of blood or coffee ground material  New chest pain or pain under the shoulder blades  Painful or persistently difficult swallowing  New shortness of breath  Fever of 100F or higher  Black, tarry-looking stools  FOLLOW UP: If any biopsies were taken you will be contacted by phone or by letter within the next 1-3 weeks.  Call your gastroenterologist if you have not heard about the biopsies in 3 weeks.  Our staff will call the home number listed on your records the next business day following your procedure to check on you and address any questions or concerns that you may have at that time regarding the information given to you following your procedure. This is a courtesy call and so if there is no answer at the home number and we have not heard from you through the emergency physician on call, we will assume that you have returned to your regular daily activities without incident.  SIGNATURES/CONFIDENTIALITY: You and/or your care   partner have signed paperwork which will be entered into your electronic medical record.  These signatures attest to the fact that that the information above on your After Visit Summary has been reviewed and is understood.  Full responsibility of the confidentiality of this discharge information lies with you and/or your care-partner.  

## 2011-12-09 NOTE — Op Note (Signed)
[facility] [address] [place], [place]  [address]  ENDOSCOPY PROCEDURE REPORT  PATIENT:  [name] [name], [name] [name]  MR#:  [id number] BIRTHDATE:  [birth year], 61 yrs. old  GENDER:  male  ENDOSCOPIST:  Barbette Hair. Arlyce Dice, MD Referred by:  Windle Guard, M.D.  PROCEDURE DATE:  12/09/2011 PROCEDURE:  EGD with biopsy, 43239 ASA CLASS:  Class III INDICATIONS:  Evaluate for esophageal varices in a patient with portal hypertension and/or cirrhosis. h/o cirrhosis; heme positive stool  MEDICATIONS:   There was residual sedation effect present from prior procedure., MAC sedation, administered by CRNA propofol 110mg IV, glycopyrrolate (Robinal) 0.2 mg IV, 0.6cc simethancone 0.6 cc PO TOPICAL ANESTHETIC:  DESCRIPTION OF PROCEDURE:   After the risks and benefits of the procedure were explained, informed consent was obtained.  The [facility] GIF-H180 [id number] endoscope was introduced through the mouth and advanced to the third portion of the duodenum.  The instrument was slowly withdrawn as the mucosa was fully examined. 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  A sessile polyp was found in the antrum (see image5). 10mm sessile polyp - bxs taken  A sessile polyp was found in the cardia. 3mm sessile polyp just beyond GE junction, vascular appearing. Bxs taken (see image7).  portal gastropathy. Diffuse, enlarged fold in cardia and fundus.    Retroflexed views revealed Retroflexion exam demonstrated findings as previously described. The scope was then withdrawn from the patient and the procedure completed.  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 Arlyce Dice, MD  CC:  n. eSIGNED:   Barbette Hair. Zariel Capano at  12/09/2011 11:34 AM  Isaac Bliss, [id number]

## 2011-12-09 NOTE — Progress Notes (Signed)
Propofol given and oxygen managed per K Rogers CRNA 

## 2011-12-10 ENCOUNTER — Telehealth: Payer: Self-pay | Admitting: *Deleted

## 2011-12-10 NOTE — Telephone Encounter (Signed)
  Follow up Call-  Call back number 12/09/2011 12/09/2011  Post procedure Call Back phone  # [phone]  Phone comments - pt misquoted number is actually [phone]  Permission to leave phone message - Yes     Left message.

## 2011-12-30 ENCOUNTER — Encounter: Payer: Self-pay | Admitting: Gastroenterology

## 2012-01-17 ENCOUNTER — Ambulatory Visit (INDEPENDENT_AMBULATORY_CARE_PROVIDER_SITE_OTHER): Payer: PRIVATE HEALTH INSURANCE | Admitting: Gastroenterology

## 2012-01-17 ENCOUNTER — Encounter: Payer: Self-pay | Admitting: Gastroenterology

## 2012-01-17 VITALS — BP 132/64 | HR 60 | Ht 64.75 in | Wt 183.5 lb

## 2012-01-17 DIAGNOSIS — I8500 Esophageal varices without bleeding: Secondary | ICD-10-CM

## 2012-01-17 DIAGNOSIS — K7460 Unspecified cirrhosis of liver: Secondary | ICD-10-CM

## 2012-01-17 NOTE — Assessment & Plan Note (Signed)
Grade 3 varices  Recommendations #1 continue nadolol  #2 followup endoscopy in one year

## 2012-01-17 NOTE — Progress Notes (Signed)
History of Present Illness:  [name] [name] has returned following upper endoscopy and colonoscopy.  Grade 3 varices and portal hypertensive gastropathy were seen. He was placed on nadolol. Multiple colon polyps were removed. Since he started iron he reports a decrease in his bruisibility. He feels well and more energetic.    Review of Systems: Pertinent positive and negative review of systems were noted in the above HPI section. All other review of systems were otherwise negative.    Current Medications, Allergies, Past Medical History, Past Surgical History, Family History and Social History were reviewed in [employer] electronic medical record  Vital signs were reviewed in today's medical record. Physical Exam: General: Well developed , well nourished, no acute distress

## 2012-01-17 NOTE — Assessment & Plan Note (Addendum)
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  He now abstains from alcohol. His potential suitability for liver transplant will probably depend upon his cardiac status since he has a history of congestive heart failure  Recommendations #1 vaccinations for pneumococcus, hepatitis a and B. #2 followup one year

## 2012-01-24 ENCOUNTER — Telehealth: Payer: Self-pay | Admitting: *Deleted

## 2012-01-24 ENCOUNTER — Ambulatory Visit: Payer: PRIVATE HEALTH INSURANCE | Admitting: Gastroenterology

## 2012-01-24 DIAGNOSIS — K7030 Alcoholic cirrhosis of liver without ascites: Secondary | ICD-10-CM

## 2012-01-24 NOTE — Telephone Encounter (Signed)
This patient came in today 01-24-2012 for his  # 2 Twinrex ( HepA/HepB) .  He mentioned to me that he had a Pneumonia Vaccine on 01-17-2012 when he saw Dr. Melvia Heaps for an office visit.  He did not call us to advise [place] of this but he had a severe reaction to the Pneumonia Vaccine.  It was given in his right deltoid.  He said he had a severe headache and dizziness for 2 days.  He also said his arm swelled at the site and it appeared to have water under the skin on the underside of his deltoid.  I looked at his arm today and it was not swelled at all now.  It appears normal.  He said that his arm feels fine now but he did want me to note this in his chart.

## 2012-02-03 ENCOUNTER — Other Ambulatory Visit: Payer: Self-pay | Admitting: Gastroenterology

## 2012-02-03 MED ORDER — NADOLOL 40 MG PO TABS: 40.0000 mg | ORAL_TABLET | Freq: Every day | ORAL | Status: DC

## 2012-02-03 NOTE — Telephone Encounter (Signed)
Patient wanted prescription mailed to him. To send to mail order, mailing script to pt today

## 2012-02-14 ENCOUNTER — Ambulatory Visit (INDEPENDENT_AMBULATORY_CARE_PROVIDER_SITE_OTHER): Payer: PRIVATE HEALTH INSURANCE | Admitting: Gastroenterology

## 2012-02-14 DIAGNOSIS — Z23 Encounter for immunization: Secondary | ICD-10-CM

## 2012-03-17 ENCOUNTER — Encounter (INDEPENDENT_AMBULATORY_CARE_PROVIDER_SITE_OTHER): Payer: Self-pay

## 2012-03-17 ENCOUNTER — Encounter (INDEPENDENT_AMBULATORY_CARE_PROVIDER_SITE_OTHER): Payer: Self-pay | Admitting: Surgery

## 2012-03-20 ENCOUNTER — Ambulatory Visit (INDEPENDENT_AMBULATORY_CARE_PROVIDER_SITE_OTHER): Payer: Self-pay | Admitting: Surgery

## 2012-04-03 ENCOUNTER — Ambulatory Visit (INDEPENDENT_AMBULATORY_CARE_PROVIDER_SITE_OTHER): Payer: PRIVATE HEALTH INSURANCE | Admitting: Surgery

## 2012-04-03 ENCOUNTER — Encounter (INDEPENDENT_AMBULATORY_CARE_PROVIDER_SITE_OTHER): Payer: Self-pay | Admitting: Surgery

## 2012-04-03 VITALS — BP 141/83 | HR 82 | Temp 97.6°F | Resp 14 | Ht 66.0 in | Wt 197.0 lb

## 2012-04-03 DIAGNOSIS — K429 Umbilical hernia without obstruction or gangrene: Secondary | ICD-10-CM

## 2012-04-03 NOTE — Progress Notes (Signed)
Patient ID: [name] [name], male   DOB: 27-Nov-1950, 61 y.o.   MRN: [id number]  Chief Complaint  Patient presents with  . Hernia    HPI [name] [name] is a 61 y.o. male.   HPI This gentleman is referred by Dr. Jeannetta Nap for evaluation of a symptomatic umbilical hernia. He has had a hernia for many years, that it is now getting larger and causing some mild discomfort. He has a significant history of cirrhosis secondary to alcohol use. He is now alcohol free and is recovering. He has no obstructive symptoms. He denies nausea or vomiting. He has had no issues clotting blood. Past Medical History  Diagnosis Date  . CHF (congestive heart failure)   . Hypertension   . GERD (gastroesophageal reflux disease)   . Alcohol abuse   . Anemia   . Arthritis   . Hyperlipidemia     Past Surgical History  Procedure Date  . Appendectomy   . Throat surgery     surgery to help with snoring  . Shoulder open rotator cuff repair     2 on right shoulder, 1 on left shoulder  . Uvulopalatopharyngoplasty 2001    Family History  Problem Relation Age of Onset  . Colon cancer Neg Hx   . Stomach cancer Neg Hx   . Rectal cancer Neg Hx   . Diabetes Mother   . Heart disease Mother   . Diabetes Father   . Heart disease Father     Social History History  Substance Use Topics  . Smoking status: Former Smoker -- 40 years    Quit date: 07/21/1983  . Smokeless tobacco: Never Used  . Alcohol Use: No     Comment: 1/5th day, none since July 21, 2011, hx of 1 gallon a day in the past    Allergies  Allergen Reactions  . Adhesive (Tape)     Use paper tape only  . Amlodipine   . Amoxicillin   . Antiseptic Products, Misc.   . Atenolol   . Celebrex (Celecoxib)     Also Vioxx  . Cephalosporins   . Clindamycin/Lincomycin   . Dexon (Dexamethasone Sodium Phosphate)   . Doxazosin Mesylate   . Duraprep (Antiseptic Products, Misc.)   . Enalapril   . Erythromycin   . Hctz (Hydrochlorothiazide)   . Latex   .  Lipitor (Atorvastatin)   . Other     Vicryl, cephalnxon  . Toprol Xl (Metoprolol Succinate)   . Verapamil   . Vioxx (Rofecoxib)   . Zocor (Simvastatin)     Current Outpatient Prescriptions  Medication Sig Dispense Refill  . allopurinol (ZYLOPRIM) 300 MG tablet Take 300 mg by mouth daily.      Marland Kitchen amitriptyline (ELAVIL) 10 MG tablet Take 10 mg by mouth at bedtime.      . carvedilol (COREG) 6.25 MG tablet Take 6.25 mg by mouth daily.      . furosemide (LASIX) 40 MG tablet Take 40 mg by mouth daily.      . meloxicam (MOBIC) 15 MG tablet Take 15 mg by mouth daily.      . Multiple Vitamin (MULITIVITAMIN WITH MINERALS) TABS Take 1 tablet by mouth daily.      . nadolol (CORGARD) 40 MG tablet Take 1 tablet (40 mg total) by mouth daily.  90 tablet  3  . omeprazole (PRILOSEC) 20 MG capsule Take 20 mg by mouth every other day.      . potassium chloride 20 MEQ/15ML (10%)  solution Take 20 mEq by mouth 2 (two) times daily.      . TESTOSTERONE IM Inject into the muscle.      . traMADol (ULTRAM) 50 MG tablet Take 50 mg by mouth every 6 (six) hours as needed.        Review of Systems Review of Systems  Constitutional: Negative for fever, chills and unexpected weight change.  HENT: Negative for hearing loss, congestion, sore throat, trouble swallowing and voice change.   Eyes: Negative for visual disturbance.  Respiratory: Negative for cough and wheezing.   Cardiovascular: Negative for chest pain, palpitations and leg swelling.  Gastrointestinal: Positive for abdominal pain. Negative for nausea, vomiting, diarrhea, constipation, blood in stool, abdominal distention, anal bleeding and rectal pain.  Genitourinary: Negative for hematuria and difficulty urinating.  Musculoskeletal: Positive for back pain and arthralgias.  Skin: Negative for rash and wound.  Neurological: Negative for seizures, syncope, weakness and headaches.  Hematological: Negative for adenopathy. Does not bruise/bleed easily.    Psychiatric/Behavioral: Negative for confusion.    Blood pressure 141/83, pulse 82, temperature 97.6 F (36.4 C), temperature source Temporal, resp. rate 14, height 5\' 6"  (1.676 m), weight 197 lb (89.359 kg).  Physical Exam Physical Exam  Constitutional: He is oriented to person, place, and time. He appears well-developed and well-nourished. No distress.  HENT:  Head: Normocephalic and atraumatic.  Right Ear: External ear normal.  Left Ear: External ear normal.  Nose: Nose normal.  Mouth/Throat: Oropharynx is clear and moist. No oropharyngeal exudate.  Eyes: Conjunctivae normal are normal. Pupils are equal, round, and reactive to light. Right eye exhibits no discharge. Left eye exhibits no discharge. No scleral icterus.  Neck: Normal range of motion. Neck supple. No tracheal deviation present. No thyromegaly present.  Cardiovascular: Normal rate, regular rhythm, normal heart sounds and intact distal pulses.   No murmur heard. Pulmonary/Chest: Effort normal and breath sounds normal. No respiratory distress. He has no wheezes. He has no rales.  Abdominal: Soft. Bowel sounds are normal. He exhibits no distension. There is no tenderness. There is no rebound.       There is a large, easily reducible hernia at the umbilicus. There does not appear to be any ascites  Musculoskeletal: Normal range of motion. He exhibits no edema and no tenderness.  Lymphadenopathy:    He has no cervical adenopathy.  Neurological: He is alert and oriented to person, place, and time.  Skin: Skin is warm and dry. No rash noted. He is not diaphoretic. No erythema.  Psychiatric: His behavior is normal. Judgment normal.    Data Reviewed   Assessment    Symptomatic umbilical hernia    Plan    Given his liver disease, I would recommend going ahead and fixing the hernia up for his liver function worsens. I explained the diagnosis and reasonings to him and his wife in detail. I discussed the risks of surgery  which includes but is not limited to bleeding, infection, injury to surrounding structures, recurrence, etc.  He understands and wishes to proceed. Surgery will be scheduled. I will check his labs preoperatively. Likelihood of success is good       Amora Sheehy A 04/03/2012, 9:43 AM

## 2012-04-14 ENCOUNTER — Encounter (HOSPITAL_BASED_OUTPATIENT_CLINIC_OR_DEPARTMENT_OTHER): Payer: Self-pay | Admitting: *Deleted

## 2012-04-14 NOTE — Progress Notes (Signed)
To come in for labs-last ekg 3/13 Alcoholic cirrhosis-dr Arlyce Dice tx Last alcohol3/13 Last echo ef up to 55=60 from 25-30 Sees [facility]-last 3/13

## 2012-04-17 ENCOUNTER — Encounter (HOSPITAL_BASED_OUTPATIENT_CLINIC_OR_DEPARTMENT_OTHER)
Admission: RE | Admit: 2012-04-17 | Discharge: 2012-04-17 | Disposition: A | Discharge status: Home / Self Care | Payer: PRIVATE HEALTH INSURANCE | Source: Ambulatory Visit | Attending: Surgery | Admitting: Surgery

## 2012-04-17 LAB — PROTIME-INR: Prothrombin Time: 13.4 seconds (ref 11.6–15.2)

## 2012-04-17 LAB — CBC WITH DIFFERENTIAL/PLATELET
Basophils Absolute: 0.1 10*3/uL (ref 0.0–0.1)
Basophils Relative: 1 % (ref 0–1)
Eosinophils Absolute: 0.3 10*3/uL (ref 0.0–0.7)
MCH: 31.9 pg (ref 26.0–34.0)
MCHC: 34.2 g/dL (ref 30.0–36.0)
Neutrophils Relative %: 58 % (ref 43–77)
Platelets: 161 10*3/uL (ref 150–400)

## 2012-04-17 LAB — COMPREHENSIVE METABOLIC PANEL
ALT: 23 U/L (ref 0–53)
AST: 34 U/L (ref 0–37)
Albumin: 3.8 g/dL (ref 3.5–5.2)
Alkaline Phosphatase: 119 U/L — ABNORMAL HIGH (ref 39–117)
Potassium: 4.8 mEq/L (ref 3.5–5.1)
Sodium: 139 mEq/L (ref 135–145)
Total Protein: 7.8 g/dL (ref 6.0–8.3)

## 2012-04-20 NOTE — H&P (Signed)
Patient ID: [name] [name], male DOB: 05/27/1950, 61 y.o. MRN: [id number]  Chief Complaint   Patient presents with   .  Hernia    HPI  [name] [name] is a 61 y.o. male.  HPI  This gentleman is referred by Dr. Jeannetta Nap for evaluation of a symptomatic umbilical hernia. He has had a hernia for many years, that it is now getting larger and causing some mild discomfort. He has a significant history of cirrhosis secondary to alcohol use. He is now alcohol free and is recovering. He has no obstructive symptoms. He denies nausea or vomiting. He has had no issues clotting blood.  Past Medical History   Diagnosis  Date   .  CHF (congestive heart failure)    .  Hypertension    .  GERD (gastroesophageal reflux disease)    .  Alcohol abuse    .  Anemia    .  Arthritis    .  Hyperlipidemia     Past Surgical History   Procedure  Date   .  Appendectomy    .  Throat surgery      surgery to help with snoring   .  Shoulder open rotator cuff repair      2 on right shoulder, 1 on left shoulder   .  Uvulopalatopharyngoplasty  2001    Family History   Problem  Relation  Age of Onset   .  Colon cancer  Neg Hx    .  Stomach cancer  Neg Hx    .  Rectal cancer  Neg Hx    .  Diabetes  Mother    .  Heart disease  Mother    .  Diabetes  Father    .  Heart disease  Father     Social History  History   Substance Use Topics   .  Smoking status:  Former Smoker -- 40 years     Quit date:  07/21/1983   .  Smokeless tobacco:  Never Used   .  Alcohol Use:  No      Comment: 1/5th day, none since July 21, 2011, hx of 1 gallon a day in the past    Allergies   Allergen  Reactions   .  Adhesive (Tape)      Use paper tape only   .  Amlodipine    .  Amoxicillin    .  Antiseptic Products, Misc.    .  Atenolol    .  Celebrex (Celecoxib)      Also Vioxx   .  Cephalosporins    .  Clindamycin/Lincomycin    .  Dexon (Dexamethasone Sodium Phosphate)    .  Doxazosin Mesylate    .  Duraprep (Antiseptic  Products, Misc.)    .  Enalapril    .  Erythromycin    .  Hctz (Hydrochlorothiazide)    .  Latex    .  Lipitor (Atorvastatin)    .  Other      Vicryl, cephalnxon   .  Toprol Xl (Metoprolol Succinate)    .  Verapamil    .  Vioxx (Rofecoxib)    .  Zocor (Simvastatin)     Current Outpatient Prescriptions   Medication  Sig  Dispense  Refill   .  allopurinol (ZYLOPRIM) 300 MG tablet  Take 300 mg by mouth daily.     Marland Kitchen  amitriptyline (ELAVIL) 10 MG tablet  Take 10 mg  by mouth at bedtime.     .  carvedilol (COREG) 6.25 MG tablet  Take 6.25 mg by mouth daily.     .  furosemide (LASIX) 40 MG tablet  Take 40 mg by mouth daily.     .  meloxicam (MOBIC) 15 MG tablet  Take 15 mg by mouth daily.     .  Multiple Vitamin (MULITIVITAMIN WITH MINERALS) TABS  Take 1 tablet by mouth daily.     .  nadolol (CORGARD) 40 MG tablet  Take 1 tablet (40 mg total) by mouth daily.  90 tablet  3   .  omeprazole (PRILOSEC) 20 MG capsule  Take 20 mg by mouth every other day.     .  potassium chloride 20 MEQ/15ML (10%) solution  Take 20 mEq by mouth 2 (two) times daily.     .  TESTOSTERONE IM  Inject into the muscle.     .  traMADol (ULTRAM) 50 MG tablet  Take 50 mg by mouth every 6 (six) hours as needed.      Review of Systems  Review of Systems  Constitutional: Negative for fever, chills and unexpected weight change.  HENT: Negative for hearing loss, congestion, sore throat, trouble swallowing and voice change.  Eyes: Negative for visual disturbance.  Respiratory: Negative for cough and wheezing.  Cardiovascular: Negative for chest pain, palpitations and leg swelling.  Gastrointestinal: Positive for abdominal pain. Negative for nausea, vomiting, diarrhea, constipation, blood in stool, abdominal distention, anal bleeding and rectal pain.  Genitourinary: Negative for hematuria and difficulty urinating.  Musculoskeletal: Positive for back pain and arthralgias.  Skin: Negative for rash and wound.  Neurological:  Negative for seizures, syncope, weakness and headaches.  Hematological: Negative for adenopathy. Does not bruise/bleed easily.  Psychiatric/Behavioral: Negative for confusion.   Blood pressure 141/83, pulse 82, temperature 97.6 F (36.4 C), temperature source Temporal, resp. rate 14, height 5\' 6"  (1.676 m), weight 197 lb (89.359 kg).  Physical Exam  Physical Exam  Constitutional: He is oriented to person, place, and time. He appears well-developed and well-nourished. No distress.  HENT:  Head: Normocephalic and atraumatic.  Right Ear: External ear normal.  Left Ear: External ear normal.  Nose: Nose normal.  Mouth/Throat: Oropharynx is clear and moist. No oropharyngeal exudate.  Eyes: Conjunctivae normal are normal. Pupils are equal, round, and reactive to light. Right eye exhibits no discharge. Left eye exhibits no discharge. No scleral icterus.  Neck: Normal range of motion. Neck supple. No tracheal deviation present. No thyromegaly present.  Cardiovascular: Normal rate, regular rhythm, normal heart sounds and intact distal pulses.  No murmur heard.  Pulmonary/Chest: Effort normal and breath sounds normal. No respiratory distress. He has no wheezes. He has no rales.  Abdominal: Soft. Bowel sounds are normal. He exhibits no distension. There is no tenderness. There is no rebound.  There is a large, easily reducible hernia at the umbilicus. There does not appear to be any ascites  Musculoskeletal: Normal range of motion. He exhibits no edema and no tenderness.  Lymphadenopathy:  He has no cervical adenopathy.  Neurological: He is alert and oriented to person, place, and time.  Skin: Skin is warm and dry. No rash noted. He is not diaphoretic. No erythema.  Psychiatric: His behavior is normal. Judgment normal.   Data Reviewed  Assessment   Symptomatic umbilical hernia   Plan   Given his liver disease, I would recommend going ahead and fixing the hernia up for his liver function  worsens.  I explained the diagnosis and reasonings to him and his wife in detail. I discussed the risks of surgery which includes but is not limited to bleeding, infection, injury to surrounding structures, recurrence, etc. He understands and wishes to proceed. Surgery will be scheduled. I will check his labs preoperatively. Likelihood of success is good   Sheniah Supak A

## 2012-04-21 ENCOUNTER — Telehealth (INDEPENDENT_AMBULATORY_CARE_PROVIDER_SITE_OTHER): Payer: Self-pay

## 2012-04-21 ENCOUNTER — Encounter (HOSPITAL_BASED_OUTPATIENT_CLINIC_OR_DEPARTMENT_OTHER): Payer: Self-pay | Admitting: Anesthesiology

## 2012-04-21 ENCOUNTER — Encounter (HOSPITAL_BASED_OUTPATIENT_CLINIC_OR_DEPARTMENT_OTHER): Payer: Self-pay | Admitting: *Deleted

## 2012-04-21 ENCOUNTER — Ambulatory Visit (HOSPITAL_BASED_OUTPATIENT_CLINIC_OR_DEPARTMENT_OTHER): Payer: PRIVATE HEALTH INSURANCE | Admitting: Anesthesiology

## 2012-04-21 ENCOUNTER — Encounter (HOSPITAL_BASED_OUTPATIENT_CLINIC_OR_DEPARTMENT_OTHER)
Admission: RE | Disposition: A | Discharge status: Home / Self Care | Payer: Self-pay | Source: Ambulatory Visit | Attending: Surgery

## 2012-04-21 ENCOUNTER — Ambulatory Visit (HOSPITAL_BASED_OUTPATIENT_CLINIC_OR_DEPARTMENT_OTHER)
Admission: RE | Admit: 2012-04-21 | Discharge: 2012-04-21 | Disposition: A | Discharge status: Home / Self Care | Payer: PRIVATE HEALTH INSURANCE | Source: Ambulatory Visit | Attending: Surgery | Admitting: Surgery

## 2012-04-21 DIAGNOSIS — Z87891 Personal history of nicotine dependence: Secondary | ICD-10-CM | POA: Insufficient documentation

## 2012-04-21 DIAGNOSIS — Z888 Allergy status to other drugs, medicaments and biological substances status: Secondary | ICD-10-CM | POA: Insufficient documentation

## 2012-04-21 DIAGNOSIS — M129 Arthropathy, unspecified: Secondary | ICD-10-CM | POA: Insufficient documentation

## 2012-04-21 DIAGNOSIS — I10 Essential (primary) hypertension: Secondary | ICD-10-CM | POA: Insufficient documentation

## 2012-04-21 DIAGNOSIS — K219 Gastro-esophageal reflux disease without esophagitis: Secondary | ICD-10-CM | POA: Insufficient documentation

## 2012-04-21 DIAGNOSIS — I509 Heart failure, unspecified: Secondary | ICD-10-CM | POA: Insufficient documentation

## 2012-04-21 DIAGNOSIS — E785 Hyperlipidemia, unspecified: Secondary | ICD-10-CM | POA: Insufficient documentation

## 2012-04-21 DIAGNOSIS — Z01812 Encounter for preprocedural laboratory examination: Secondary | ICD-10-CM | POA: Insufficient documentation

## 2012-04-21 DIAGNOSIS — K429 Umbilical hernia without obstruction or gangrene: Secondary | ICD-10-CM | POA: Insufficient documentation

## 2012-04-21 DIAGNOSIS — Z833 Family history of diabetes mellitus: Secondary | ICD-10-CM | POA: Insufficient documentation

## 2012-04-21 DIAGNOSIS — Z91040 Latex allergy status: Secondary | ICD-10-CM | POA: Insufficient documentation

## 2012-04-21 DIAGNOSIS — Z8249 Family history of ischemic heart disease and other diseases of the circulatory system: Secondary | ICD-10-CM | POA: Insufficient documentation

## 2012-04-21 DIAGNOSIS — Z9089 Acquired absence of other organs: Secondary | ICD-10-CM | POA: Insufficient documentation

## 2012-04-21 DIAGNOSIS — Z800 Family history of malignant neoplasm of digestive organs: Secondary | ICD-10-CM | POA: Insufficient documentation

## 2012-04-21 DIAGNOSIS — Z880 Allergy status to penicillin: Secondary | ICD-10-CM | POA: Insufficient documentation

## 2012-04-21 HISTORY — PX: UMBILICAL HERNIA REPAIR: SHX196

## 2012-04-21 HISTORY — PX: INSERTION OF MESH: SHX5868

## 2012-04-21 HISTORY — DX: Alcoholic cirrhosis of liver without ascites: K70.30

## 2012-04-21 HISTORY — DX: Sleep apnea, unspecified: G47.30

## 2012-04-21 SURGERY — REPAIR, HERNIA, UMBILICAL, ADULT
Anesthesia: General | Site: Abdomen | Wound class: Clean

## 2012-04-21 MED ORDER — HYDROCODONE-ACETAMINOPHEN 5-325 MG PO TABS: 1.0000 | ORAL_TABLET | ORAL | Status: AC | PRN

## 2012-04-21 MED ORDER — OXYCODONE HCL 5 MG PO TABS: 5.0000 mg | ORAL_TABLET | Freq: Once | ORAL | Status: DC | PRN

## 2012-04-21 MED ORDER — LIDOCAINE HCL (CARDIAC) 20 MG/ML IV SOLN
INTRAVENOUS | Status: DC | PRN
  Administered 2012-04-21: 75 mg via INTRAVENOUS

## 2012-04-21 MED ORDER — PROMETHAZINE HCL 25 MG/ML IJ SOLN: 6.2500 mg | INTRAMUSCULAR | Status: DC | PRN

## 2012-04-21 MED ORDER — FENTANYL CITRATE 0.05 MG/ML IJ SOLN: 50.0000 ug | Freq: Once | INTRAMUSCULAR | Status: DC

## 2012-04-21 MED ORDER — PROPOFOL 10 MG/ML IV BOLUS
INTRAVENOUS | Status: DC | PRN
  Administered 2012-04-21: 200 mg via INTRAVENOUS

## 2012-04-21 MED ORDER — CIPROFLOXACIN IN D5W 400 MG/200ML IV SOLN
400.0000 mg | INTRAVENOUS | Status: AC
  Administered 2012-04-21: 400 mg via INTRAVENOUS

## 2012-04-21 MED ORDER — EPHEDRINE SULFATE 50 MG/ML IJ SOLN
INTRAMUSCULAR | Status: DC | PRN
  Administered 2012-04-21: 15 mg via INTRAVENOUS

## 2012-04-21 MED ORDER — LACTATED RINGERS IV SOLN
INTRAVENOUS | Status: DC
  Administered 2012-04-21 (×2): via INTRAVENOUS

## 2012-04-21 MED ORDER — KETOROLAC TROMETHAMINE 30 MG/ML IJ SOLN
INTRAMUSCULAR | Status: DC | PRN
  Administered 2012-04-21: 30 mg via INTRAVENOUS

## 2012-04-21 MED ORDER — ACETAMINOPHEN 325 MG PO TABS: 650.0000 mg | ORAL_TABLET | ORAL | Status: DC | PRN

## 2012-04-21 MED ORDER — HYDRALAZINE HCL 20 MG/ML IJ SOLN
5.0000 mg | Freq: Once | INTRAMUSCULAR | Status: AC
  Administered 2012-04-21: 5 mg via INTRAVENOUS

## 2012-04-21 MED ORDER — MIDAZOLAM HCL 2 MG/2ML IJ SOLN: 1.0000 mg | INTRAMUSCULAR | Status: DC | PRN

## 2012-04-21 MED ORDER — DEXAMETHASONE SODIUM PHOSPHATE 4 MG/ML IJ SOLN
INTRAMUSCULAR | Status: DC | PRN
  Administered 2012-04-21: 10 mg via INTRAVENOUS

## 2012-04-21 MED ORDER — HYDROMORPHONE HCL PF 1 MG/ML IJ SOLN: 0.2500 mg | INTRAMUSCULAR | Status: DC | PRN

## 2012-04-21 MED ORDER — FENTANYL CITRATE 0.05 MG/ML IJ SOLN
INTRAMUSCULAR | Status: DC | PRN
  Administered 2012-04-21: 50 ug via INTRAVENOUS

## 2012-04-21 MED ORDER — BUPIVACAINE-EPINEPHRINE 0.5% -1:200000 IJ SOLN
INTRAMUSCULAR | Status: DC | PRN
  Administered 2012-04-21: 10 mL

## 2012-04-21 MED ORDER — OXYCODONE HCL 5 MG PO TABS: 5.0000 mg | ORAL_TABLET | ORAL | Status: DC | PRN

## 2012-04-21 MED ORDER — ACETAMINOPHEN 650 MG RE SUPP: 650.0000 mg | RECTAL | Status: DC | PRN

## 2012-04-21 MED ORDER — SODIUM CHLORIDE 0.9 % IJ SOLN: 3.0000 mL | INTRAMUSCULAR | Status: DC | PRN

## 2012-04-21 MED ORDER — ATROPINE SULFATE 0.4 MG/ML IJ SOLN
INTRAMUSCULAR | Status: DC | PRN
  Administered 2012-04-21: 0.2 mg via INTRAVENOUS

## 2012-04-21 MED ORDER — GLYCOPYRROLATE 0.2 MG/ML IJ SOLN
INTRAMUSCULAR | Status: DC | PRN
  Administered 2012-04-21: 0.2 mg via INTRAVENOUS

## 2012-04-21 MED ORDER — OXYCODONE HCL 5 MG/5ML PO SOLN: 5.0000 mg | Freq: Once | ORAL | Status: DC | PRN

## 2012-04-21 MED ORDER — SODIUM CHLORIDE 0.9 % IV SOLN: 250.0000 mL | INTRAVENOUS | Status: DC | PRN

## 2012-04-21 MED ORDER — SODIUM CHLORIDE 0.9 % IJ SOLN: 3.0000 mL | Freq: Two times a day (BID) | INTRAMUSCULAR | Status: DC

## 2012-04-21 MED ORDER — MORPHINE SULFATE 4 MG/ML IJ SOLN: 4.0000 mg | INTRAMUSCULAR | Status: DC | PRN

## 2012-04-21 MED ORDER — MIDAZOLAM HCL 5 MG/5ML IJ SOLN
INTRAMUSCULAR | Status: DC | PRN
  Administered 2012-04-21: 2 mg via INTRAVENOUS

## 2012-04-21 MED ORDER — ONDANSETRON HCL 4 MG/2ML IJ SOLN: 4.0000 mg | Freq: Four times a day (QID) | INTRAMUSCULAR | Status: DC | PRN

## 2012-04-21 SURGICAL SUPPLY — 50 items
BENZOIN TINCTURE PRP APPL 2/3 (GAUZE/BANDAGES/DRESSINGS) ×2 IMPLANT
BLADE SURG 15 STRL LF DISP TIS (BLADE) ×1 IMPLANT
BLADE SURG 15 STRL SS (BLADE) ×1
BLADE SURG ROTATE 9660 (MISCELLANEOUS) ×2 IMPLANT
CANISTER SUCTION 1200CC (MISCELLANEOUS) ×2 IMPLANT
CHLORAPREP W/TINT 26ML (MISCELLANEOUS) ×2 IMPLANT
CLEANER CAUTERY TIP 5X5 PAD (MISCELLANEOUS) ×1 IMPLANT
CLOTH BEACON ORANGE TIMEOUT ST (SAFETY) ×2 IMPLANT
COVER MAYO STAND STRL (DRAPES) ×2 IMPLANT
COVER TABLE BACK 60X90 (DRAPES) ×2 IMPLANT
DECANTER SPIKE VIAL GLASS SM (MISCELLANEOUS) IMPLANT
DRAPE PED LAPAROTOMY (DRAPES) ×2 IMPLANT
DRAPE UTILITY XL STRL (DRAPES) ×2 IMPLANT
DRSG TEGADERM 2-3/8X2-3/4 SM (GAUZE/BANDAGES/DRESSINGS) IMPLANT
DRSG TEGADERM 4X4.75 (GAUZE/BANDAGES/DRESSINGS) ×2 IMPLANT
ELECT REM PT RETURN 9FT ADLT (ELECTROSURGICAL) ×2
ELECTRODE REM PT RTRN 9FT ADLT (ELECTROSURGICAL) ×1 IMPLANT
GLOVE BIOGEL PI IND STRL 7.5 (GLOVE) ×2 IMPLANT
GLOVE BIOGEL PI INDICATOR 7.5 (GLOVE) ×2
GLOVE EXAM NITRILE LRG STRL (GLOVE) ×2 IMPLANT
GLOVE SKINSENSE NS SZ7.0 (GLOVE) ×1
GLOVE SKINSENSE NS SZ7.5 (GLOVE) ×1
GLOVE SKINSENSE STRL SZ7.0 (GLOVE) ×1 IMPLANT
GLOVE SKINSENSE STRL SZ7.5 (GLOVE) ×1 IMPLANT
GLOVE SURG SIGNA 7.5 PF LTX (GLOVE) IMPLANT
GOWN PREVENTION PLUS XLARGE (GOWN DISPOSABLE) ×4 IMPLANT
GOWN PREVENTION PLUS XXLARGE (GOWN DISPOSABLE) ×2 IMPLANT
NEEDLE HYPO 25X1 1.5 SAFETY (NEEDLE) ×2 IMPLANT
NS IRRIG 1000ML POUR BTL (IV SOLUTION) IMPLANT
PACK BASIN DAY SURGERY FS (CUSTOM PROCEDURE TRAY) ×2 IMPLANT
PAD CLEANER CAUTERY TIP 5X5 (MISCELLANEOUS) ×1
PATCH VENTRAL SMALL 4.3 (Mesh Specialty) ×2 IMPLANT
PENCIL BUTTON HOLSTER BLD 10FT (ELECTRODE) ×2 IMPLANT
SLEEVE SCD COMPRESS KNEE MED (MISCELLANEOUS) ×2 IMPLANT
SPONGE LAP 4X18 X RAY DECT (DISPOSABLE) ×2 IMPLANT
STRIP CLOSURE SKIN 1/2X4 (GAUZE/BANDAGES/DRESSINGS) ×2 IMPLANT
SUT MNCRL AB 4-0 PS2 18 (SUTURE) ×2 IMPLANT
SUT MON AB 3-0 SH 27 (SUTURE) ×1
SUT MON AB 3-0 SH27 (SUTURE) ×1 IMPLANT
SUT NOVA NAB DX-16 0-1 5-0 T12 (SUTURE) ×2 IMPLANT
SUT VIC AB 2-0 SH 27 (SUTURE)
SUT VIC AB 2-0 SH 27XBRD (SUTURE) IMPLANT
SUT VIC AB 3-0 SH 27 (SUTURE)
SUT VIC AB 3-0 SH 27X BRD (SUTURE) IMPLANT
SYR CONTROL 10ML LL (SYRINGE) ×2 IMPLANT
TOWEL OR 17X24 6PK STRL BLUE (TOWEL DISPOSABLE) ×2 IMPLANT
TOWEL OR NON WOVEN STRL DISP B (DISPOSABLE) ×2 IMPLANT
TUBE CONNECTING 20X1/4 (TUBING) ×2 IMPLANT
WATER STERILE IRR 1000ML POUR (IV SOLUTION) IMPLANT
YANKAUER SUCT BULB TIP NO VENT (SUCTIONS) ×2 IMPLANT

## 2012-04-21 NOTE — Telephone Encounter (Signed)
[facility] RN called stating pt has hx of liver disease and pt is questioning if he can take Norco that was written for po pain. Reviewed with Dr Magnus Ivan. Per Dr Eliberto Ivory request Bonita Quin notified to advise pt that pre op liver labs are normal and a short course of Norco post op will be safe to take. She states she will advise pt.

## 2012-04-21 NOTE — Op Note (Signed)
HERNIA REPAIR UMBILICAL ADULT, INSERTION OF MESH  Procedure Note  [name] [name] 04/21/2012   Pre-op Diagnosis: umbilical hernia     Post-op Diagnosis: same  Procedure(s): HERNIA REPAIR UMBILICAL ADULT INSERTION OF MESH (4.3 cm round v-patch)  Surgeon(s): Shelly Rubenstein, MD  Anesthesia: General  Staff:  Raliegh Scarlet, RN - Circulator Donald Pore, CST - Scrub Person Duard Larsen, RN - Scrub Person  Estimated Blood Loss: Minimal               Procedure: The patient was brought to the operating room and identified as the correct patient. He was placed supine on the operating room table and general anesthesia was induced. His abdomen was prepped and draped in the usual sterile fashion. I anesthetized the skin of florets umbilicus Marcaine. I made [name] transverse incision with [name] scalpel. I took this down to the umbilical hernia sac which was completely excised. Omentum that was in the sac was reduced back into the abdominal cavity. All the attachments to the surrounding peritoneal surface were taken down as well. I placed [name] 4.3 cm round piece of mesh through the opening the fascia and pulled it up against the peritoneal surface With the stay ties. I then sutured the mesh in place with interrupted #1 Novafil sutures. I then closed the fascia over the top of the mesh with [name] figure-of-eight #1 Novafil suture. I anesthetized the skin further with Marcaine. I closed the subcutaneous tissue with interrupted 3-0 Monocryl sutures and then closed the skin with [name] running 4-0 Monocryl suture. Steri-Strips gauze and tape were then applied. The patient tolerated the procedure well. All the counts were correct at the end of the procedure. The patient was extubated in the operating room and taken in [name] stable condition to the recovery room.Marland Kitchen          [name] [name]   Date: 04/21/2012  Time: 12:20 PM

## 2012-04-21 NOTE — Anesthesia Preprocedure Evaluation (Addendum)
Anesthesia Evaluation  Patient identified by MRN, date of birth, ID band Patient awake    Reviewed: Allergy & Precautions, H&P , NPO status , Patient's Chart, lab work & pertinent test results  Airway Mallampati: II TM Distance: >3 FB Neck ROM: Full    Dental   Pulmonary former smoker,  breath sounds clear to auscultation        Cardiovascular hypertension, Rhythm:Regular Rate:Normal     Neuro/Psych    GI/Hepatic GERD-  ,(+)     substance abuse  alcohol use,   Endo/Other    Renal/GU      Musculoskeletal   Abdominal   Peds  Hematology   Anesthesia Other Findings   Reproductive/Obstetrics                          Anesthesia Physical Anesthesia Plan  ASA: III  Anesthesia Plan: General   Post-op Pain Management:    Induction: Intravenous  Airway Management Planned: LMA  Additional Equipment:   Intra-op Plan:   Post-operative Plan: Extubation in OR  Informed Consent: I have reviewed the patients History and Physical, chart, labs and discussed the procedure including the risks, benefits and alternatives for the proposed anesthesia with the patient or authorized representative who has indicated his/her understanding and acceptance.     Plan Discussed with: CRNA and Surgeon  Anesthesia Plan Comments:        Anesthesia Quick Evaluation

## 2012-04-21 NOTE — Transfer of Care (Signed)
Immediate Anesthesia Transfer of Care Note  Patient: [name] [name]  Procedure(s) Performed: Procedure(s) (LRB) with comments: HERNIA REPAIR UMBILICAL ADULT (N/A) - umbilical hernia repair with mesh INSERTION OF MESH (N/A)  Patient Location: PACU  Anesthesia Type:General  Level of Consciousness: awake, alert  and oriented  Airway & Oxygen Therapy: Patient Spontanous Breathing and Patient connected to face mask oxygen  Post-op Assessment: Report given to PACU RN and Post -op Vital signs reviewed and stable  Post vital signs: Reviewed and stable  Complications: No apparent anesthesia complications

## 2012-04-21 NOTE — Anesthesia Postprocedure Evaluation (Signed)
  Anesthesia Post-op Note  Patient: [name] [name]  Procedure(s) Performed: Procedure(s) (LRB) with comments: HERNIA REPAIR UMBILICAL ADULT (N/A) - umbilical hernia repair with mesh INSERTION OF MESH (N/A)  Patient Location: PACU  Anesthesia Type:General  Level of Consciousness: awake and alert   Airway and Oxygen Therapy: Patient Spontanous Breathing and Patient connected to face mask oxygen  Post-op Pain: none  Post-op Assessment: Post-op Vital signs reviewed, Patient's Cardiovascular Status Stable, Respiratory Function Stable, Patent Airway and No signs of Nausea or vomiting  Post-op Vital Signs: Reviewed and stable  Complications: No apparent anesthesia complications

## 2012-04-21 NOTE — Interval H&P Note (Signed)
History and Physical Interval Note: no change in H and P  04/21/2012 11:05 AM  [name] [name]  has presented today for surgery, with the diagnosis of umbilical hernia  The various methods of treatment have been discussed with the patient and family. After consideration of risks, benefits and other options for treatment, the patient has consented to  Procedure(s) (LRB) with comments: HERNIA REPAIR UMBILICAL ADULT (N/A) - umbilical hernia repair with mesh INSERTION OF MESH (N/A) as a surgical intervention .  The patient's history has been reviewed, patient examined, no change in status, stable for surgery.  I have reviewed the patient's chart and labs.  Questions were answered to the patient's satisfaction.     [name]

## 2012-04-24 ENCOUNTER — Encounter (HOSPITAL_BASED_OUTPATIENT_CLINIC_OR_DEPARTMENT_OTHER): Payer: Self-pay | Admitting: Surgery

## 2012-05-08 ENCOUNTER — Encounter (INDEPENDENT_AMBULATORY_CARE_PROVIDER_SITE_OTHER): Payer: Self-pay | Admitting: Surgery

## 2012-05-08 ENCOUNTER — Ambulatory Visit (INDEPENDENT_AMBULATORY_CARE_PROVIDER_SITE_OTHER): Payer: Self-pay | Admitting: Surgery

## 2012-05-08 VITALS — BP 170/84 | HR 60 | Temp 97.0°F | Resp 16 | Ht 66.0 in | Wt 196.0 lb

## 2012-05-08 DIAGNOSIS — Z09 Encounter for follow-up examination after completed treatment for conditions other than malignant neoplasm: Secondary | ICD-10-CM

## 2012-05-08 NOTE — Progress Notes (Signed)
Subjective:     Patient ID: [name] [name], male   DOB: October 04, 1950, 62 y.o.   MRN: [id number]  HPI He is here for his first postop visit status post umbilical hernia repair with mesh. He is doing very well and has no complaints.  Review of Systems     Objective:   Physical Exam On exam, his incision is well-healed with no evidence of recurrence    Assessment:     Patient stable postop    Plan:     He will refrain from heavy lifting until after January 17. I will see him back as needed

## 2012-09-26 ENCOUNTER — Other Ambulatory Visit: Payer: Self-pay

## 2012-10-13 ENCOUNTER — Observation Stay (HOSPITAL_COMMUNITY)
Admission: EM | Admit: 2012-10-13 | Discharge: 2012-10-14 | DRG: 551 | Disposition: A | Discharge status: Home / Self Care | Payer: BC Managed Care – PPO | Attending: Internal Medicine | Admitting: Internal Medicine

## 2012-10-13 ENCOUNTER — Emergency Department (HOSPITAL_COMMUNITY): Payer: BC Managed Care – PPO

## 2012-10-13 ENCOUNTER — Encounter (HOSPITAL_COMMUNITY): Payer: Self-pay | Admitting: Emergency Medicine

## 2012-10-13 DIAGNOSIS — F1010 Alcohol abuse, uncomplicated: Secondary | ICD-10-CM

## 2012-10-13 DIAGNOSIS — S32010A Wedge compression fracture of first lumbar vertebra, initial encounter for closed fracture: Secondary | ICD-10-CM | POA: Insufficient documentation

## 2012-10-13 DIAGNOSIS — I509 Heart failure, unspecified: Secondary | ICD-10-CM | POA: Insufficient documentation

## 2012-10-13 DIAGNOSIS — K7030 Alcoholic cirrhosis of liver without ascites: Secondary | ICD-10-CM | POA: Insufficient documentation

## 2012-10-13 DIAGNOSIS — F1020 Alcohol dependence, uncomplicated: Secondary | ICD-10-CM | POA: Insufficient documentation

## 2012-10-13 DIAGNOSIS — I8510 Secondary esophageal varices without bleeding: Secondary | ICD-10-CM | POA: Insufficient documentation

## 2012-10-13 DIAGNOSIS — K2920 Alcoholic gastritis without bleeding: Principal | ICD-10-CM | POA: Insufficient documentation

## 2012-10-13 DIAGNOSIS — Z8739 Personal history of other diseases of the musculoskeletal system and connective tissue: Secondary | ICD-10-CM | POA: Insufficient documentation

## 2012-10-13 DIAGNOSIS — I10 Essential (primary) hypertension: Secondary | ICD-10-CM | POA: Insufficient documentation

## 2012-10-13 DIAGNOSIS — R945 Abnormal results of liver function studies: Secondary | ICD-10-CM

## 2012-10-13 DIAGNOSIS — I5022 Chronic systolic (congestive) heart failure: Secondary | ICD-10-CM

## 2012-10-13 DIAGNOSIS — R7989 Other specified abnormal findings of blood chemistry: Secondary | ICD-10-CM

## 2012-10-13 DIAGNOSIS — R079 Chest pain, unspecified: Secondary | ICD-10-CM

## 2012-10-13 DIAGNOSIS — K7460 Unspecified cirrhosis of liver: Secondary | ICD-10-CM

## 2012-10-13 DIAGNOSIS — I5032 Chronic diastolic (congestive) heart failure: Secondary | ICD-10-CM | POA: Diagnosis present

## 2012-10-13 DIAGNOSIS — I8500 Esophageal varices without bleeding: Secondary | ICD-10-CM

## 2012-10-13 LAB — RAPID URINE DRUG SCREEN, HOSP PERFORMED
Amphetamines: NOT DETECTED
Tetrahydrocannabinol: NOT DETECTED

## 2012-10-13 LAB — BASIC METABOLIC PANEL
BUN: 13 mg/dL (ref 6–23)
Creatinine, Ser: 0.6 mg/dL (ref 0.50–1.35)
GFR calc Af Amer: >90 mL/min (ref >90)
GFR calc non Af Amer: >90 mL/min (ref >90)

## 2012-10-13 LAB — CBC WITH DIFFERENTIAL/PLATELET
Basophils Relative: 0 % (ref 0–1)
Eosinophils Absolute: 0.1 10*3/uL (ref 0.0–0.7)
HCT: 42.8 % (ref 39.0–52.0)
Hemoglobin: 14.7 g/dL (ref 13.0–17.0)
MCH: 30.2 pg (ref 26.0–34.0)
MCHC: 34.3 g/dL (ref 30.0–36.0)
Monocytes Absolute: 1.3 10*3/uL — ABNORMAL HIGH (ref 0.1–1.0)
Monocytes Relative: 12 % (ref 3–12)

## 2012-10-13 LAB — PROTIME-INR: Prothrombin Time: 12.6 seconds (ref 11.6–15.2)

## 2012-10-13 MED ORDER — ALLOPURINOL 300 MG PO TABS
300.0000 mg | ORAL_TABLET | ORAL | Status: DC
  Filled 2012-10-13: qty 1

## 2012-10-13 MED ORDER — MORPHINE SULFATE 4 MG/ML IJ SOLN
4.0000 mg | Freq: Once | INTRAMUSCULAR | Status: AC
  Administered 2012-10-13: 4 mg via INTRAVENOUS
  Filled 2012-10-13: qty 1

## 2012-10-13 MED ORDER — FOLIC ACID 1 MG PO TABS
1.0000 mg | ORAL_TABLET | Freq: Every day | ORAL | Status: DC
  Administered 2012-10-13: 1 mg via ORAL
  Filled 2012-10-13 (×2): qty 1

## 2012-10-13 MED ORDER — PANTOPRAZOLE SODIUM 40 MG PO TBEC
40.0000 mg | DELAYED_RELEASE_TABLET | Freq: Two times a day (BID) | ORAL | Status: DC
  Administered 2012-10-13: 40 mg via ORAL
  Filled 2012-10-13: qty 1

## 2012-10-13 MED ORDER — ONDANSETRON HCL 4 MG/2ML IJ SOLN
4.0000 mg | Freq: Once | INTRAMUSCULAR | Status: AC
  Administered 2012-10-13: 4 mg via INTRAVENOUS
  Filled 2012-10-13: qty 2

## 2012-10-13 MED ORDER — LORAZEPAM 2 MG/ML IJ SOLN
1.0000 mg | Freq: Four times a day (QID) | INTRAMUSCULAR | Status: DC | PRN
  Administered 2012-10-13: 1 mg via INTRAVENOUS
  Filled 2012-10-13: qty 1

## 2012-10-13 MED ORDER — GI COCKTAIL ~~LOC~~
30.0000 mL | Freq: Once | ORAL | Status: AC
  Administered 2012-10-13: 30 mL via ORAL
  Filled 2012-10-13: qty 30

## 2012-10-13 MED ORDER — LORAZEPAM 0.5 MG PO TABS: 1.0000 mg | ORAL_TABLET | Freq: Four times a day (QID) | ORAL | Status: DC | PRN

## 2012-10-13 MED ORDER — SODIUM CHLORIDE 0.9 % IJ SOLN
3.0000 mL | Freq: Two times a day (BID) | INTRAMUSCULAR | Status: DC
  Administered 2012-10-13: 3 mL via INTRAVENOUS

## 2012-10-13 MED ORDER — ASPIRIN 81 MG PO CHEW
324.0000 mg | CHEWABLE_TABLET | Freq: Once | ORAL | Status: AC
  Administered 2012-10-13: 324 mg via ORAL
  Filled 2012-10-13: qty 4

## 2012-10-13 MED ORDER — TRAMADOL HCL 50 MG PO TABS
50.0000 mg | ORAL_TABLET | Freq: Four times a day (QID) | ORAL | Status: DC | PRN
  Administered 2012-10-13: 50 mg via ORAL
  Filled 2012-10-13 (×2): qty 1

## 2012-10-13 MED ORDER — POTASSIUM CHLORIDE CRYS ER 20 MEQ PO TBCR
40.0000 meq | EXTENDED_RELEASE_TABLET | Freq: Once | ORAL | Status: AC
  Administered 2012-10-13: 40 meq via ORAL
  Filled 2012-10-13: qty 2

## 2012-10-13 MED ORDER — THIAMINE HCL 100 MG/ML IJ SOLN
100.0000 mg | Freq: Every day | INTRAMUSCULAR | Status: DC
  Filled 2012-10-13 (×2): qty 1

## 2012-10-13 MED ORDER — ADULT MULTIVITAMIN W/MINERALS CH
1.0000 | ORAL_TABLET | Freq: Every day | ORAL | Status: DC
  Administered 2012-10-13: 1 via ORAL
  Filled 2012-10-13 (×2): qty 1

## 2012-10-13 MED ORDER — FUROSEMIDE 40 MG PO TABS
40.0000 mg | ORAL_TABLET | Freq: Every day | ORAL | Status: DC
  Administered 2012-10-13: 40 mg via ORAL
  Filled 2012-10-13 (×2): qty 1

## 2012-10-13 MED ORDER — VITAMIN B-1 100 MG PO TABS
100.0000 mg | ORAL_TABLET | Freq: Every day | ORAL | Status: DC
  Administered 2012-10-13: 100 mg via ORAL
  Filled 2012-10-13 (×2): qty 1

## 2012-10-13 MED ORDER — HEPARIN SODIUM (PORCINE) 5000 UNIT/ML IJ SOLN
5000.0000 [IU] | Freq: Three times a day (TID) | INTRAMUSCULAR | Status: DC
  Administered 2012-10-13 – 2012-10-14 (×2): 5000 [IU] via SUBCUTANEOUS
  Filled 2012-10-13 (×5): qty 1

## 2012-10-13 MED ORDER — NADOLOL 40 MG PO TABS
40.0000 mg | ORAL_TABLET | Freq: Every day | ORAL | Status: DC
  Administered 2012-10-13: 40 mg via ORAL
  Filled 2012-10-13 (×2): qty 1

## 2012-10-13 MED ORDER — LORAZEPAM 2 MG/ML IJ SOLN
1.0000 mg | Freq: Once | INTRAMUSCULAR | Status: AC
  Administered 2012-10-13: 1 mg via INTRAVENOUS
  Filled 2012-10-13: qty 1

## 2012-10-13 NOTE — ED Notes (Signed)
Patient transported to X-ray 

## 2012-10-13 NOTE — H&P (Signed)
Date: 10/13/2012               Patient Name:  [name] [name] MRN: [id number]  DOB: April 11, 1951 Age / Sex: 62 y.o., male   PCP: Kaleen Mask, MD         Medical Service: Internal Medicine Teaching Service         Attending Physician: Dr. Joya Gaskins, MD    First Contact: Dr. Burtis Junes Pager: [phone]  Second Contact: Dr. Clyde Lundborg Pager: [phone]       After Hours (After 5p/  First Contact Pager: [phone]  weekends / holidays): Second Contact Pager: [phone]   Chief Complaint: CP  History of Present Illness: [name] [name] is a 62 yo man pmh of HTN, liver disease with cirrhosis and grade III esophageal varices, and EtOH abuse p/w ongoing CP. Patient describes burning chest pain located over epigastric area, that does not radiate. Rated 10/10 in severity. Aggravated by drinking EtOH and eating. Alleviated by antacids and GI cocktail in ED. Pt denied any diaphoresis or associated nausea/vomiting/diarrhea. Pt has had some ongoing constipation. No fever/chills or recent sick contacts. Pt was a former smoker (~50 pack years) but quit in 1980s. Pt did obtain sobriety for the past 14 months but then given recent life stressors started again drinking at minimum 1/5th vodka a day. Pt continues to receive testosterone injections that improve his energy level. No SOB or DOE and pt is able to tolerate daily walks. Pt has also had some easy bruseing lately. Pt denied lightheadness, dizziness, LOC, palpitations, abdominal pain, LE edema, or hematochezia/hemetemesis. Pt has had very serious EtOH withdrawal including DTs and seizures.   Meds: No current facility-administered medications for this encounter.   Current Outpatient Prescriptions  Medication Sig Dispense Refill  . allopurinol (ZYLOPRIM) 300 MG tablet Take 300 mg by mouth every 3 (three) days.       . furosemide (LASIX) 40 MG tablet Take 40 mg by mouth daily.      . meloxicam (MOBIC) 15 MG tablet Take 15 mg by mouth daily as needed for pain.         . Multiple Vitamin (MULITIVITAMIN WITH MINERALS) TABS Take 1 tablet by mouth daily.      . nadolol (CORGARD) 40 MG tablet Take 1 tablet (40 mg total) by mouth daily.  90 tablet  3  . omeprazole (PRILOSEC) 20 MG capsule Take 20 mg by mouth daily.       . TESTOSTERONE IM Inject 1.5 mLs into the muscle every 14 (fourteen) days.       . traMADol (ULTRAM) 50 MG tablet Take 50 mg by mouth every 6 (six) hours as needed for pain.        Allergies: Allergies as of 10/13/2012 - Review Complete 10/13/2012  Allergen Reaction Noted  . Adhesive (tape) Other (See Comments) 07/21/2011  . Amlodipine Other (See Comments) 10/29/2011  . Amoxicillin Hives 10/29/2011  . Antiseptic products, misc.  07/21/2011  . Atenolol Other (See Comments) 10/29/2011  . Celebrex (celecoxib) Nausea And Vomiting 07/21/2011  . Cephalosporins Hives 07/21/2011  . Clindamycin/lincomycin Hives 07/21/2011  . Dexon (dexamethasone sodium phosphate) Other (See Comments) 07/21/2011  . Doxazosin mesylate Other (See Comments) 10/29/2011  . Duraprep (antiseptic products, misc.)  10/29/2011  . Enalapril Other (See Comments) 10/29/2011  . Erythromycin Hives 07/21/2011  . Hctz (hydrochlorothiazide) Other (See Comments) 07/21/2011  . Lipitor (atorvastatin)  10/29/2011  . Other  07/21/2011  . Toprol xl (metoprolol succinate)  07/21/2011  .  Verapamil Other (See Comments) 10/29/2011  . Vioxx (rofecoxib) Hives 10/29/2011  . Zocor (simvastatin) Nausea And Vomiting 07/21/2011  . Latex Rash 07/21/2011   Past Medical History  Diagnosis Date  . Hypertension   . Alcohol abuse   . Anemia   . Hyperlipidemia   . GERD (gastroesophageal reflux disease)   . Cirrhosis, alcoholic     last etoh 3/13-sees dr Marina Goodell  . Arthritis   . CHF (congestive heart failure)   . Sleep apnea     had surgery to correct snoring 2001   Past Surgical History  Procedure Laterality Date  . Appendectomy    . Throat surgery      surgery to help with snoring  .  Shoulder open rotator cuff repair      2 on right shoulder, 1 on left shoulder  . Uvulopalatopharyngoplasty  2001  . Mouth surgery  7/13  . Colonoscopy  2013  . Upper gi endoscopy  2013  . Umbilical hernia repair  04/21/2012    Procedure: HERNIA REPAIR UMBILICAL ADULT;  Surgeon: Shelly Rubenstein, MD;  Location: [facility];  Service: General;  Laterality: N/A;  umbilical hernia repair with mesh  . Insertion of mesh  04/21/2012    Procedure: INSERTION OF MESH;  Surgeon: Shelly Rubenstein, MD;  Location: [facility];  Service: General;  Laterality: N/A;  . Hernia repair     Family History  Problem Relation Age of Onset  . Colon cancer Neg Hx   . Stomach cancer Neg Hx   . Rectal cancer Neg Hx   . Diabetes Mother   . Heart disease Mother   . Diabetes Father   . Heart disease Father    History   Social History  . Marital Status: Married    Spouse Name: N/A    Number of Children: N/A  . Years of Education: N/A   Occupational History  . Not on file.   Social History Main Topics  . Smoking status: Former Smoker -- 40 years    Quit date: 07/21/1983  . Smokeless tobacco: Never Used  . Alcohol Use: No     Comment: 1/5th day, none since July 21, 2011, hx of 1 gallon a day in the past  . Drug Use: No  . Sexually Active: Not on file   Other Topics Concern  . Not on file   Social History Narrative  . No narrative on file    Review of Systems: Pertinent items are noted in HPI.  Physical Exam: Blood pressure 174/73, pulse 69, temperature 98.2 F (36.8 C), temperature source Oral, resp. rate 14, SpO2 94.00%. General: resting in bed, anxious HEENT: PERRL, EOMI, MMM, no scleral icterus Cardiac: RRR, no rubs, 3/6 early systolic blowing murmur at LICS w/o radiation, normal S1/S2 no gallops Pulm: clear to auscultation bilaterally, moving normal volumes of air Abd: soft, nontender, nondistended, BS present, umbilical scar Ext: warm and well  perfused, no pedal edema, scattered spider nevi, some ecchymosis on LE and UE Neuro: alert and oriented X3, cranial nerves II-XII grossly intact  Lab results: Basic Metabolic Panel:  Recent Labs  16/10/96 1333  NA 139  K 3.4*  CL 102  CO2 23  GLUCOSE 138*  BUN 13  CREATININE 0.60  CALCIUM 9.1   CBC:  Recent Labs  10/13/12 1333  WBC 11.1*  NEUTROABS 8.3*  HGB 14.7  HCT 42.8  MCV 88.1  PLT 133*   Urine Drug Screen: Drugs of  Abuse     Component Value Date/Time   LABOPIA NONE DETECTED 07/22/2011 0020   COCAINSCRNUR NONE DETECTED 07/22/2011 0020   LABBENZ POSITIVE* 07/22/2011 0020   AMPHETMU NONE DETECTED 07/22/2011 0020   THCU NONE DETECTED 07/22/2011 0020   LABBARB NONE DETECTED 07/22/2011 0020    Imaging results:  Dg Chest 2 View  10/13/2012   *RADIOLOGY REPORT*  Clinical Data: Chest pain and difficulty breathing.  Dizziness.  CHEST - 2 VIEW  Comparison: Chest x-ray 07/21/2011.  Findings: Lung volumes are normal.  No consolidative airspace disease.  No pleural effusions. Mild diffuse peribronchial cuffing. No pneumothorax.  No pulmonary nodule or mass noted.  Pulmonary vasculature and the cardiomediastinal silhouette are within normal limits.  Atherosclerotic calcifications are noted within the arch of the aorta.  IMPRESSION: 1.  Mild diffuse peribronchial cuffing, without other acute abnormalities.  This may reflect a mild bronchitis. 2.  Atherosclerosis.   Original Report Authenticated By: Trudie Reed, M.D.    Other results: EKG: normal EKG, normal sinus rhythm, unchanged from previous tracings, nonspecific ST changes and slight LVH.  Assessment & Plan by Problem: # Atypical Chest Pain: Pt with recent large volume EtOH consumption possible gastritis as pt is not tachycardic indicating possible withdrawal and demand with some ischemia. Pt had full resolution of CP after GI cocktail. Revised Geneva 3.0 making PE less likely. No infiltrates on CXR to indicate PNA or  acute ? CHF exacerbation. Last Echo 3/13 EF 55-60% and no WMA but diastolic fxn couldn't be evaluated. No stress test available per chart review. Most likley etiology gastritis but pt has significant risk factors TIMI of 3.  -Gi cocktail -trops x3 -AM EKG -CIWA protocol -Mg, Bmet -UDS, EtOH level  # Ethoic liver cirrohsis: MELD score:7. EGD with grade III esophageal varices. Pt has been vaccinated for HepA/B and pneumoniae. Pt had also been on Megace but d/c in 2013. Pt doesn't appear to be actively bleeding but at increased risk and having some constipation.  -cont nadolol  -CBC, PT/INR -senna-s   #HTN: Pt wasn't able to take home medications on day of admission and pressures 170s/60s. Pt has extensive allergies to CCB, HCTZ, ACEi, and amlodipine.  -cont home lasix and nadolol  Dispo: Disposition is deferred at this time, awaiting improvement of current medical problems. Anticipated discharge in approximately 1-2 day(s).   The patient does have a current PCP Andrey Campanile Elizebeth Koller, MD) and does not need an [facility] hospital follow-up appointment after discharge.  The patient does not have transportation limitations that hinder transportation to clinic appointments.  Signed: Christen Bame, MD 10/13/2012, 3:23 PM  Pgr: [phone]

## 2012-10-13 NOTE — H&P (Signed)
[name] [name] is an 62 y.o. male. HPI  [name] is a 62 yo male with a PMH of alcoholism with current relapse in May after 14 months of sobriety, history of complicated withdrawal, cirrhosis with esophageal varices, systolic heart failure, and anemia, who presents to [facility] ED this morning with constant substernal chest pain since 2 am. The patient's ex-wife was in the room and provided much of the history although patient was alert and cooperative. The pain woke him up from sleep. This pain is described as gnawing. It does not radiate anywhere. There is no associated diaphoresis, palpitations, nausea, vomiting, or shortness of breath. Patient denies any exacerbating or alleviating factors. The most comfortable position is leaning forward or standing. He has been experiencing this pain on and off for the past few weeks. It has not been associated with activity. It typically occurs in the evening when he eats after drinking alcohol during the day and not eating food. Patient has been drinking approximately 1/5th of vodka daily since his relapse in May. Patient denies nausea or vomiting, bloody stools, melena, or abdominal pain. Patient admits heartburn, easy bruising. Patient admits increasing shortness of breath and dyspnea on exertion but is still able to take daily walks. Patient denies edema.  Review of Systems  Constitutional: Positive for chills. Negative for fever and diaphoresis.  HENT: Negative for congestion and sore throat.   Eyes: Negative for blurred vision and double vision.  Respiratory: Positive for shortness of breath. Negative for cough and wheezing.   Cardiovascular: Positive for chest pain. Negative for palpitations, orthopnea, claudication, leg swelling and PND.  Gastrointestinal: Positive for heartburn, abdominal pain and constipation. Negative for nausea, vomiting, diarrhea, blood in stool and melena.  Genitourinary: Negative for dysuria, frequency and hematuria.   Musculoskeletal: Negative for myalgias, back pain and joint pain.  Skin: Negative for rash.  Neurological: Negative for dizziness, seizures, weakness and headaches.  Endo/Heme/Allergies: Bruises/bleeds easily.  Psychiatric/Behavioral: Positive for substance abuse. Negative for suicidal ideas and hallucinations. The patient is nervous/anxious.     Blood pressure 148/69, pulse 66, temperature 98.2 F (36.8 C), temperature source Oral, resp. rate 17, SpO2 95.00%. Physical Exam  Constitutional: He is oriented to person, place, and time. He appears well-developed and well-nourished. No distress.  HENT:  Head: Normocephalic and atraumatic.  Eyes: EOM are normal. Pupils are equal, round, and reactive to light. No scleral icterus.  Neck: Normal range of motion. Neck supple.  Cardiovascular: Normal rate and regular rhythm.   Murmur heard. II/VI blowing holosystolic murmur best heard at lower left sternal border  Respiratory: Effort normal. No respiratory distress. He has no wheezes. He has no rales. He exhibits tenderness.  Mild parasternal tenderness to palpation. Distant breath sounds.  GI: He exhibits distension. He exhibits no mass. There is no tenderness. There is no rebound and no guarding.  Hyperactive bowel sounds.  Musculoskeletal: He exhibits no edema.  Neurological: He is alert and oriented to person, place, and time. No cranial nerve deficit.  Intoxicated during interview. Affect inappropriately nonplussed.  Skin: Skin is warm and dry. Rash noted. He is not diaphoretic. No erythema.  Scattered ecchymoses present over arms bilaterally with some eschars. Eschar on right knee. Mild macular rash over abdomen.  Psychiatric: Thought content normal.  Poor judgement. Mood/affect inappropriately nonplussed.    Admission on 10/13/2012  Component Date Value Range Status  . WBC 10/13/2012 11.1* 4.0 - 10.5 K/uL Final  . RBC 10/13/2012 4.86  4.22 - 5.81 MIL/uL  Final  . Hemoglobin 10/13/2012  14.7  13.0 - 17.0 g/dL Final  . HCT 47/82/9562 42.8  39.0 - 52.0 % Final  . MCV 10/13/2012 88.1  78.0 - 100.0 fL Final  . MCH 10/13/2012 30.2  26.0 - 34.0 pg Final  . MCHC 10/13/2012 34.3  30.0 - 36.0 g/dL Final  . RDW 13/12/6576 15.8* 11.5 - 15.5 % Final  . Platelets 10/13/2012 133* 150 - 400 K/uL Final  . Neutrophils Relative % 10/13/2012 75  43 - 77 % Final  . Neutro Abs 10/13/2012 8.3* 1.7 - 7.7 K/uL Final  . Lymphocytes Relative 10/13/2012 12  12 - 46 % Final  . Lymphs Abs 10/13/2012 1.4  0.7 - 4.0 K/uL Final  . Monocytes Relative 10/13/2012 12  3 - 12 % Final  . Monocytes Absolute 10/13/2012 1.3* 0.1 - 1.0 K/uL Final  . Eosinophils Relative 10/13/2012 1  0 - 5 % Final  . Eosinophils Absolute 10/13/2012 0.1  0.0 - 0.7 K/uL Final  . Basophils Relative 10/13/2012 0  0 - 1 % Final  . Basophils Absolute 10/13/2012 0.0  0.0 - 0.1 K/uL Final  . Sodium 10/13/2012 139  135 - 145 mEq/L Final  . Potassium 10/13/2012 3.4* 3.5 - 5.1 mEq/L Final  . Chloride 10/13/2012 102  96 - 112 mEq/L Final  . CO2 10/13/2012 23  19 - 32 mEq/L Final  . Glucose, Bld 10/13/2012 138* 70 - 99 mg/dL Final  . BUN 46/96/2952 13  6 - 23 mg/dL Final  . Creatinine, Ser 10/13/2012 0.60  0.50 - 1.35 mg/dL Final  . Calcium 84/13/2440 9.1  8.4 - 10.5 mg/dL Final  . GFR calc non Af Amer 10/13/2012 >90  >90 mL/min Final  . GFR calc Af Amer 10/13/2012 >90  >90 mL/min Final  . Troponin i, poc 10/13/2012 0.01  0.00 - 0.08 ng/mL Final  . Comment 3 10/13/2012          Final  . Alcohol, Ethyl (B) 10/13/2012 243* 0 - 11 mg/dL Final   CHEST - 2 VIEW  Comparison: Chest x-ray 07/21/2011.  Findings: Lung volumes are normal. No consolidative airspace  disease. No pleural effusions. Mild diffuse peribronchial cuffing.  No pneumothorax. No pulmonary nodule or mass noted. Pulmonary  vasculature and the cardiomediastinal silhouette are within normal  limits. Atherosclerotic calcifications are noted within the arch  of the aorta.   IMPRESSION:  1. Mild diffuse peribronchial cuffing, without other acute  abnormalities. This may reflect a mild bronchitis.  2. Atherosclerosis.  Original Report Authenticated By: Trudie Reed, M.D.   Assessment/Plan [name] [name] is a 62 yo alcoholic man with a PMH of complicated withdrawal, cirrhosis with grade 3 esophageal varices, CHF, and anemia who presents to [facility] ED with several hours of sustained chest pain. Presentation is concerning for ACS vs. Aortic dissection vs. PNA vs. Pneumothorax vs. Pancreatitis vs. Esophageal rupture vs. Alcoholic gastritis vs. PUD. Pain resolved with receipt of GI cocktail, strongly suggesting gastritis or PUD. Infectious etiology (PNA, gastroenteritis) less likely due to afebrile, no proceeding illness, no diarrhea. WBC mildly elevated. Lack of respiratory symptoms make pneumothorax less likely. Resolution of symptoms with GI treatment makes pancreatitis, ACS, aortic dissection much less likely. Troponins negative in ED; EKG no acute events. CXR showed mild peribronchial cuffing that could be consistent with a mild bronchitis. Pulmonary exam was normal.  #Chest pain: Most likely gastritis from alcohol abuse given resolution of symptoms with GI cocktail. Alcohol level 243 on admission. Still need  to rule out ACS. -- Admit to floor for overnight OBS to rule out ACS. -- CIWA protocol. -- Trops x3. -- EKG, CXR. -- Avoid NSAIDs. -- Restart home antihypertensives (did not take this AM) - see below.  #Dyspnea: Progressive dyspnea on exertion concerning for ACS vs. CHF exacerbation vs. COPD. Patient is currently breathing comfortably and is without peripheral edema or JVD. Pulmonary exam unremarkable. 12/2009 2D echo showed 20-25% LVEF, dilated left ventricular wall with diffuse hypokinesis. 07/2011 2D echo showed LVEF 60% without wall motion abnormalities. CXR no acute infiltrates or processes. -- Continue to monitor for now.  #Cirrhosis with grade 3 esophageal  varices: Unable to palpate liver on exam. No ascites, jaundice. Extensive bruising and patient reported easy bruising/bleeding concerning for thrombocytopenia and decreased clotting factors 2/2 end-stage liver disease.  -- Check PT/INR. -- Nadolol for varices.  #HTN: BP mildly elevated to 170s systolic on admission likely 2/2 chronic alcohol abuse and missing medications this AM. Restart home antihypertensives. If patient needs additional medical therapy to control HTN this will be difficult due to extensive allergies to antihypertensives (HCTZ, enalapril, beta-blockers, CCB). -- Lasix -- Nadolol  #DVT: Platelets 133 on admission. -- Heparin  Claudine Mouton 10/13/2012, 4:17 PM

## 2012-10-13 NOTE — ED Notes (Signed)
MD at bedside. 

## 2012-10-13 NOTE — ED Notes (Signed)
Pt c/o severe mid sternal to epigastric area CP intermittent for some time worse today; pt admits to drinking approx 1 fifth a day of vodka and has a hx of reflux

## 2012-10-13 NOTE — ED Provider Notes (Signed)
History     CSN: [id number]  Arrival date & time 10/13/12  1250   First MD Initiated Contact with Patient 10/13/12 1306      Chief Complaint  Patient presents with  . Chest Pain    (Consider location/radiation/quality/duration/timing/severity/associated sxs/prior treatment) HPI  [name] [name] is a 62 y.o.male presenting to the ER with complaints of chest pain since 2am this morning. He is an alcohol that was sober for 14 months but relapsed in May 2014 and has been drinking since then. He does not feel that he can stop. The chest pain is described as constant but waxing and waning. It is pressure and sharp in nature. His dad died of a heart attack in his mid 66s. He endorses nausea, cold sweats and diaphoresis. He denies vomiting, weakness or fevers. The HPI and clinical picture is complicated because he seems intoxicated at this time.  He was admitted for a cardiac r/o in march of 2013 and was told "everything was normal". He is currently having pain but it is not as significant as earlier. No cath, no hx of MI.    Past Medical History  Diagnosis Date  . Hypertension   . Alcohol abuse   . Anemia   . Hyperlipidemia   . GERD (gastroesophageal reflux disease)   . Cirrhosis, alcoholic     last etoh 3/13-sees dr Marina Goodell  . Arthritis   . CHF (congestive heart failure)   . Sleep apnea     had surgery to correct snoring 2001    Past Surgical History  Procedure Laterality Date  . Appendectomy    . Throat surgery      surgery to help with snoring  . Shoulder open rotator cuff repair      2 on right shoulder, 1 on left shoulder  . Uvulopalatopharyngoplasty  2001  . Mouth surgery  7/13  . Colonoscopy  2013  . Upper gi endoscopy  2013  . Umbilical hernia repair  04/21/2012    Procedure: HERNIA REPAIR UMBILICAL ADULT;  Surgeon: Shelly Rubenstein, MD;  Location: [facility];  Service: General;  Laterality: N/A;  umbilical hernia repair with mesh  . Insertion of mesh   04/21/2012    Procedure: INSERTION OF MESH;  Surgeon: Shelly Rubenstein, MD;  Location:  SURGERY CENTER;  Service: General;  Laterality: N/A;  . Hernia repair      Family History  Problem Relation Age of Onset  . Colon cancer Neg Hx   . Stomach cancer Neg Hx   . Rectal cancer Neg Hx   . Diabetes Mother   . Heart disease Mother   . Diabetes Father   . Heart disease Father     History  Substance Use Topics  . Smoking status: Former Smoker -- 40 years    Quit date: 07/21/1983  . Smokeless tobacco: Never Used  . Alcohol Use: No     Comment: 1/5th day, none since July 21, 2011, hx of 1 gallon a day in the past      Review of Systems  Constitutional: Positive for diaphoresis.  Cardiovascular: Positive for chest pain.  Gastrointestinal: Positive for nausea.  All other systems reviewed and are negative.    Allergies  Adhesive; Amlodipine; Amoxicillin; Antiseptic products, misc.; Atenolol; Celebrex; Cephalosporins; Clindamycin/lincomycin; Dexon; Doxazosin mesylate; Duraprep; Enalapril; Erythromycin; Hctz; Lipitor; Other; Toprol xl; Verapamil; Vioxx; Zocor; and Latex  Home Medications   Current Outpatient Rx  Name  Route  Sig  Dispense  Refill  . allopurinol (ZYLOPRIM) 300 MG tablet   Oral   Take 300 mg by mouth every 3 (three) days.          . furosemide (LASIX) 40 MG tablet   Oral   Take 40 mg by mouth daily.         . meloxicam (MOBIC) 15 MG tablet   Oral   Take 15 mg by mouth daily as needed for pain.          . Multiple Vitamin (MULITIVITAMIN WITH MINERALS) TABS   Oral   Take 1 tablet by mouth daily.         . nadolol (CORGARD) 40 MG tablet   Oral   Take 1 tablet (40 mg total) by mouth daily.   90 tablet   3   . omeprazole (PRILOSEC) 20 MG capsule   Oral   Take 20 mg by mouth daily.          . TESTOSTERONE IM   Intramuscular   Inject 1.5 mLs into the muscle every 14 (fourteen) days.          . traMADol (ULTRAM) 50 MG tablet    Oral   Take 50 mg by mouth every 6 (six) hours as needed for pain.            BP 174/73  Pulse 69  Temp(Src) 98.2 F (36.8 C) (Oral)  Resp 14  SpO2 94%  Physical Exam  Nursing note and vitals reviewed. Constitutional: He appears well-developed and well-nourished. No distress.  HENT:  Head: Normocephalic and atraumatic.  Eyes: Pupils are equal, round, and reactive to light.  Neck: Normal range of motion. Neck supple.  Cardiovascular: Normal rate and regular rhythm.   Pulmonary/Chest: Effort normal.  Abdominal: Soft.  Neurological: He is alert.  Skin: Skin is warm and dry.  Psychiatric: He exhibits a depressed mood.  Questionably intoxicated at this time.    ED Course  Procedures (including critical care time)  Labs Reviewed  CBC WITH DIFFERENTIAL - Abnormal; Notable for the following:    WBC 11.1 (*)    RDW 15.8 (*)    Platelets 133 (*)    Neutro Abs 8.3 (*)    Monocytes Absolute 1.3 (*)    All other components within normal limits  BASIC METABOLIC PANEL - Abnormal; Notable for the following:    Potassium 3.4 (*)    Glucose, Bld 138 (*)    All other components within normal limits  ETHANOL  URINE RAPID DRUG SCREEN (HOSP PERFORMED)  POCT I-STAT TROPONIN I   Dg Chest 2 View  10/13/2012   *RADIOLOGY REPORT*  Clinical Data: Chest pain and difficulty breathing.  Dizziness.  CHEST - 2 VIEW  Comparison: Chest x-ray 07/21/2011.  Findings: Lung volumes are normal.  No consolidative airspace disease.  No pleural effusions. Mild diffuse peribronchial cuffing. No pneumothorax.  No pulmonary nodule or mass noted.  Pulmonary vasculature and the cardiomediastinal silhouette are within normal limits.  Atherosclerotic calcifications are noted within the arch of the aorta.  IMPRESSION: 1.  Mild diffuse peribronchial cuffing, without other acute abnormalities.  This may reflect a mild bronchitis. 2.  Atherosclerosis.   Original Report Authenticated By: Trudie Reed, M.D.     1.  Chest pain       MDM   Date: 10/13/2012  Rate: 64  Rhythm: normal sinus rhythm  QRS Axis: normal  Intervals: normal  ST/T Wave abnormalities: nonspecific ST changes  Conduction Disutrbances:none  Narrative  Interpretation: moderate LVH, may be normal variant  Old EKG Reviewed: improved from July 22, 2011   Troponin, chest xray and EKG unremarkable. He was given aspirin, GI cocktail and IV morphine in ED and pain completely resolved.  Dr. Bebe Shaggy saw patient recommends he be admitted. Questionable alcohol intoxication at this time so alcohol level has been added on.  Obs, Internal Medicine residents to admit I offered to place holding orders and alcohol withdrawal protocol and admitting provider declined for me to put in temporary admitting orders or alcohol withdrawal protocol at this time.         Dorthula Matas, PA-C 10/13/12 1509  Dorthula Matas, PA-C 10/17/12 [phone]

## 2012-10-13 NOTE — H&P (Signed)
Resident Addendum to Medical Student Note   I have seen and examined the patient, and agree with the the medical student assessment and plan outlined above. Please see my note for additional details.  Christen Bame  10/13/2012, 6:41 PM

## 2012-10-13 NOTE — Progress Notes (Signed)
Patient complaining of chest pain starting to come back persistantly rated about 8. Visible sweating noted on forehead and sheet saturated. Mild headache noted 2. Noted pacing from bed to door. CIWA score increased from 12 at 2154 to 20 at 2350.

## 2012-10-14 LAB — GLUCOSE, CAPILLARY: Glucose-Capillary: 101 mg/dL — ABNORMAL HIGH (ref 70–99)

## 2012-10-14 LAB — COMPREHENSIVE METABOLIC PANEL
Alkaline Phosphatase: 145 U/L — ABNORMAL HIGH (ref 39–117)
BUN: 14 mg/dL (ref 6–23)
Calcium: 9.1 mg/dL (ref 8.4–10.5)
GFR calc Af Amer: >90 mL/min (ref >90)
Glucose, Bld: 99 mg/dL (ref 70–99)
Potassium: 3.9 mEq/L (ref 3.5–5.1)
Total Protein: 7.3 g/dL (ref 6.0–8.3)

## 2012-10-14 LAB — TROPONIN I
Troponin I: <0.3 ng/mL (ref <0.30)
Troponin I: <0.3 ng/mL (ref <0.30)

## 2012-10-14 LAB — HEMOGLOBIN A1C: Mean Plasma Glucose: 103 mg/dL (ref <117)

## 2012-10-14 MED ORDER — GI COCKTAIL ~~LOC~~
30.0000 mL | Freq: Once | ORAL | Status: AC
  Administered 2012-10-14: 30 mL via ORAL
  Filled 2012-10-14: qty 30

## 2012-10-14 MED ORDER — LORAZEPAM 0.5 MG PO TABS: 1.0000 mg | ORAL_TABLET | ORAL | Status: DC | PRN

## 2012-10-14 MED ORDER — LORAZEPAM 2 MG/ML IJ SOLN: 1.0000 mg | INTRAMUSCULAR | Status: DC | PRN

## 2012-10-14 MED ORDER — LORAZEPAM 2 MG/ML IJ SOLN
2.0000 mg | Freq: Once | INTRAMUSCULAR | Status: AC
Start: 2012-10-14 — End: 2012-10-14
  Administered 2012-10-14: 2 mg via INTRAVENOUS
  Filled 2012-10-14: qty 1

## 2012-10-14 MED ORDER — OMEPRAZOLE 20 MG PO CPDR: 40.0000 mg | DELAYED_RELEASE_CAPSULE | Freq: Every day | ORAL | Status: DC

## 2012-10-14 NOTE — Progress Notes (Signed)
Resident Addendum to Medical Student Note   I have seen and examined the patient, and agree with the the medical student assessment and plan outlined above. Please see my note below for additional details.  [name] [name]  10/14/2012, 11:17 AM

## 2012-10-14 NOTE — Progress Notes (Signed)
Subjective: Pt is doing well this AM. Full resolution of CP with GI cocktail overnight. Extensive discussion this AM in terms of EtOH cessation was had with patient. Pt would like to detox at home. Pt was made aware of all the risks and dangers including death that could occur if pt detoxs at home and not at a center or hospital. Pt was competent and understood these risks but still insisted on d/c home. Pt was informed of all the warning symptoms of seizures and DTs and that pt should seek medical attention if these were to occur immediately. Pt verbalized understanding.   Objective: Vital signs in last 24 hours: Filed Vitals:   10/13/12 1731 10/13/12 2100 10/13/12 2349 10/14/12 0631  BP: 165/89 188/88 200/94 185/85  Pulse: 81 72 67 67  Temp: 98.5 F (36.9 C) 98.3 F (36.8 C) 97.5 F (36.4 C) 98.2 F (36.8 C)  TempSrc: Oral Oral Oral Oral  Resp: 17 18 18 18   Height:      Weight:    198 lb 3.2 oz (89.903 kg)  SpO2: 96% 94% 95% 94%   Weight change:   Intake/Output Summary (Last 24 hours) at 10/14/12 0941 Last data filed at 10/14/12 0900  Gross per 24 hour  Intake    240 ml  Output   2075 ml  Net  -1835 ml   General: resting in bed, NAD HEENT: PERRL, EOMI, no scleral icterus Cardiac: RRR, no rubs, murmurs or gallops Pulm: clear to auscultation bilaterally, moving normal volumes of air Abd: soft, nontender, nondistended, BS present Ext: warm and well perfused, no pedal edema Neuro: alert and oriented X3, cranial nerves II-XII grossly intact  Lab Results: Basic Metabolic Panel:  Recent Labs Lab 10/13/12 1333 10/13/12 1752 10/14/12 0520  NA 139  --  137  K 3.4*  --  3.9  CL 102  --  96  CO2 23  --  31  GLUCOSE 138*  --  99  BUN 13  --  14  CREATININE 0.60  --  0.77  CALCIUM 9.1  --  9.1  MG  --  2.4  --   PHOS  --  2.4  --    Liver Function Tests:  Recent Labs Lab 10/14/12 0520  AST 68*  ALT 42  ALKPHOS 145*  BILITOT 1.2  PROT 7.3  ALBUMIN 3.5    CBC:  Recent Labs Lab 10/13/12 1333  WBC 11.1*  NEUTROABS 8.3*  HGB 14.7  HCT 42.8  MCV 88.1  PLT 133*   Cardiac Enzymes:  Recent Labs Lab 10/13/12 1753 10/13/12 2330 10/14/12 0520  TROPONINI <0.30 <0.30 <0.30   CBG:  Recent Labs Lab 10/14/12 0627  GLUCAP 101*   Coagulation:  Recent Labs Lab 10/13/12 1752  LABPROT 12.6  INR 0.95   Urine Drug Screen: Drugs of Abuse     Component Value Date/Time   LABOPIA NONE DETECTED 10/13/2012 1600   COCAINSCRNUR NONE DETECTED 10/13/2012 1600   LABBENZ NONE DETECTED 10/13/2012 1600   AMPHETMU NONE DETECTED 10/13/2012 1600   THCU NONE DETECTED 10/13/2012 1600   LABBARB NONE DETECTED 10/13/2012 1600    Alcohol Level:  Recent Labs Lab 10/13/12 1505  ETH 243*    Micro Results: No results found for this or any previous visit (from the past 240 hour(s)). Studies/Results: Dg Chest 2 View  10/13/2012   *RADIOLOGY REPORT*  Clinical Data: Chest pain and difficulty breathing.  Dizziness.  CHEST - 2 VIEW  Comparison: Chest x-ray  07/21/2011.  Findings: Lung volumes are normal.  No consolidative airspace disease.  No pleural effusions. Mild diffuse peribronchial cuffing. No pneumothorax.  No pulmonary nodule or mass noted.  Pulmonary vasculature and the cardiomediastinal silhouette are within normal limits.  Atherosclerotic calcifications are noted within the arch of the aorta.  IMPRESSION: 1.  Mild diffuse peribronchial cuffing, without other acute abnormalities.  This may reflect a mild bronchitis. 2.  Atherosclerosis.   Original Report Authenticated By: Trudie Reed, M.D.   Medications: I have reviewed the patient's current medications. Scheduled Meds: . allopurinol  300 mg Oral Q72H  . folic acid  1 mg Oral Daily  . furosemide  40 mg Oral Daily  . heparin  5,000 Units Subcutaneous Q8H  . multivitamin with minerals  1 tablet Oral Daily  . nadolol  40 mg Oral Daily  . pantoprazole  40 mg Oral BID  . sodium chloride  3 mL  Intravenous Q12H  . thiamine  100 mg Oral Daily   Or  . thiamine  100 mg Intravenous Daily   Continuous Infusions:  PRN Meds:.LORazepam, LORazepam, traMADol Assessment/Plan: # Atypical Chest Pain likely gastritis: Pt with recent large volume EtOH consumption possible gastritis as pt is not tachycardic indicating possible withdrawal and demand with some ischemia. Pt had full resolution of CP after GI cocktail. Revised Geneva 3.0 making PE less likely. No infiltrates on CXR to indicate PNA or acute ? CHF exacerbation. Last Echo 3/13 EF 55-60% and no WMA but diastolic fxn couldn't be evaluated. No stress test available per chart review. TIMI 2/3 but ACS r/o with negative trops x3 and no changes on serial EKGs. Markedly elevated EtOH level. -Gi cocktail  -CIWA protocol   # Ethoic liver cirrohsis: MELD score:7. EGD with grade III esophageal varices. Pt has been vaccinated for HepA/B and pneumoniae. Pt had also been on Megace but d/c in 2013. Pt doesn't appear to be actively bleeding but at increased risk and having some constipation.  -cont nadolol  -senna-s  -please see above for extensive discussion and pt decision  #HTN: Pt wasn't able to take home medications on day of admission and pressures 170s/60s. Pt has extensive allergies to CCB, HCTZ, ACEi, and amlodipine.  -cont home lasix and nadolol  Dispo: Disposition is deferred at this time, awaiting improvement of current medical problems.  Anticipated discharge in approximately 1 day(s).   The patient does have a current PCP Andrey Campanile Elizebeth Koller, MD) and does need an [facility] hospital follow-up appointment after discharge.  The patient does not have transportation limitations that hinder transportation to clinic appointments.  .Services Needed at time of discharge: Y = Yes, Blank = No PT:   OT:   RN:   Equipment:   Other:     LOS: 1 day   Christen Bame, MD 10/14/2012, 9:41 AM Pgr: [phone]

## 2012-10-14 NOTE — Discharge Summary (Signed)
   I have seen the patient and reviewed the discharge summary with the medical student and discussed the care of the patient with them.  See my note for documentation of my findings, assessment, and plans.  Christen Bame, MD 10/14/2012, 2:33 PM

## 2012-10-14 NOTE — H&P (Signed)
Date: 10/14/2012  Patient name: [name] [name]  Medical record number: [id number]  Date of birth: November 22, 1950   I have seen and evaluated Rise Mu and discussed their care with the Residency Team. Mr People presented to the hospital with CP that was substernal and constant since waking him at 2 AM on day of admission. He describes it as burning and severe 10/10. It continued until he got IV MSO4 and a GI cocktail in the ED. IT reoccurred last night about midnight and again cleared with MSO4 and a GI cocktail. He was able to eat a full breakfast today and the food didn't increase the pain. He states he has had several months of "indigestion" but this pain was different.   [place] [facility] and is able to walk 2.5 miles and can go hiking in woods for up to 7 miles. He might occassionally have to stop for a second to catch his breath.   He is an alcoholic. About 1.5 yrs ago, he paid for month at [employer] [employer], left and went to liquor store on way home. He shortly then realized he had to stop that nonsense and went "cold [place]" at home by himself with only some benzos that his PCP gave him. He has had DT's and reportedly szs but pt denies the szs. He relapsed May 2014 after going to AA mtgs for a few months bc taking about ETOH made him want to resume.   Today feels tremulous and other sxs early withdrawal.  Physical Exam: Blood pressure 185/85, pulse 67, temperature 98.2 F (36.8 C), temperature source Oral, resp. rate 18, height 5\' 6"  (1.676 m), weight 198 lb 3.2 oz (89.903 kg), SpO2 94.00%. General appearance: alert, cooperative, appears stated age, no distress and slightly tremulous (hands B) Head: Normocephalic, without obvious abnormality, atraumatic Eyes: Sclera clear, EOMI  Lungs: clear to auscultation bilaterally Heart: regular rate and rhythm, S1, S2 normal, no murmur, click, rub or gallop Abdomen: soft, non-tender; bowel sounds normal; no masses,  no  organomegaly Extremities: extremities normal, atraumatic, no cyanosis or edema Pulses: 2+ and symmetric Skin: Had sunburn on arms so now increased pigmentation. Spinder angioma on skin with some ecchymosis.  Lab results: Results for orders placed during the hospital encounter of 10/13/12 (from the past 24 hour(s))  CBC WITH DIFFERENTIAL     Status: Abnormal   Collection Time    10/13/12  1:33 PM      Result Value Range   WBC 11.1 (*) 4.0 - 10.5 K/uL   RBC 4.86  4.22 - 5.81 MIL/uL   Hemoglobin 14.7  13.0 - 17.0 g/dL   HCT 14.7  82.9 - 56.2 %   MCV 88.1  78.0 - 100.0 fL   MCH 30.2  26.0 - 34.0 pg   MCHC 34.3  30.0 - 36.0 g/dL   RDW 13.0 (*) 86.5 - 78.4 %   Platelets 133 (*) 150 - 400 K/uL   Neutrophils Relative % 75  43 - 77 %   Neutro Abs 8.3 (*) 1.7 - 7.7 K/uL   Lymphocytes Relative 12  12 - 46 %   Lymphs Abs 1.4  0.7 - 4.0 K/uL   Monocytes Relative 12  3 - 12 %   Monocytes Absolute 1.3 (*) 0.1 - 1.0 K/uL   Eosinophils Relative 1  0 - 5 %   Eosinophils Absolute 0.1  0.0 - 0.7 K/uL   Basophils Relative 0  0 - 1 %  Basophils Absolute 0.0  0.0 - 0.1 K/uL  BASIC METABOLIC PANEL     Status: Abnormal   Collection Time    10/13/12  1:33 PM      Result Value Range   Sodium 139  135 - 145 mEq/L   Potassium 3.4 (*) 3.5 - 5.1 mEq/L   Chloride 102  96 - 112 mEq/L   CO2 23  19 - 32 mEq/L   Glucose, Bld 138 (*) 70 - 99 mg/dL   BUN 13  6 - 23 mg/dL   Creatinine, Ser 0.98  0.50 - 1.35 mg/dL   Calcium 9.1  8.4 - 11.9 mg/dL   GFR calc non Af Amer >90  >90 mL/min   GFR calc Af Amer >90  >90 mL/min  POCT I-STAT TROPONIN I     Status: None   Collection Time    10/13/12  1:48 PM      Result Value Range   Troponin i, poc 0.01  0.00 - 0.08 ng/mL   Comment 3           ETHANOL     Status: Abnormal   Collection Time    10/13/12  3:05 PM      Result Value Range   Alcohol, Ethyl (B) 243 (*) 0 - 11 mg/dL  URINE RAPID DRUG SCREEN (HOSP PERFORMED)     Status: None   Collection Time     10/13/12  4:00 PM      Result Value Range   Opiates NONE DETECTED  NONE DETECTED   Cocaine NONE DETECTED  NONE DETECTED   Benzodiazepines NONE DETECTED  NONE DETECTED   Amphetamines NONE DETECTED  NONE DETECTED   Tetrahydrocannabinol NONE DETECTED  NONE DETECTED   Barbiturates NONE DETECTED  NONE DETECTED  PROTIME-INR     Status: None   Collection Time    10/13/12  5:52 PM      Result Value Range   Prothrombin Time 12.6  11.6 - 15.2 seconds   INR 0.95  0.00 - 1.49  MAGNESIUM     Status: None   Collection Time    10/13/12  5:52 PM      Result Value Range   Magnesium 2.4  1.5 - 2.5 mg/dL  PHOSPHORUS     Status: None   Collection Time    10/13/12  5:52 PM      Result Value Range   Phosphorus 2.4  2.3 - 4.6 mg/dL  TROPONIN I     Status: None   Collection Time    10/13/12  5:53 PM      Result Value Range   Troponin I <0.30  <0.30 ng/mL  TROPONIN I     Status: None   Collection Time    10/13/12 11:30 PM      Result Value Range   Troponin I <0.30  <0.30 ng/mL  TROPONIN I     Status: None   Collection Time    10/14/12  5:20 AM      Result Value Range   Troponin I <0.30  <0.30 ng/mL  COMPREHENSIVE METABOLIC PANEL     Status: Abnormal   Collection Time    10/14/12  5:20 AM      Result Value Range   Sodium 137  135 - 145 mEq/L   Potassium 3.9  3.5 - 5.1 mEq/L   Chloride 96  96 - 112 mEq/L   CO2 31  19 - 32 mEq/L   Glucose, Bld 99  70 - 99 mg/dL   BUN 14  6 - 23 mg/dL   Creatinine, Ser 5.28  0.50 - 1.35 mg/dL   Calcium 9.1  8.4 - 41.3 mg/dL   Total Protein 7.3  6.0 - 8.3 g/dL   Albumin 3.5  3.5 - 5.2 g/dL   AST 68 (*) 0 - 37 U/L   ALT 42  0 - 53 U/L   Alkaline Phosphatase 145 (*) 39 - 117 U/L   Total Bilirubin 1.2  0.3 - 1.2 mg/dL   GFR calc non Af Amer >90  >90 mL/min   GFR calc Af Amer >90  >90 mL/min  GLUCOSE, CAPILLARY     Status: Abnormal   Collection Time    10/14/12  6:27 AM      Result Value Range   Glucose-Capillary 101 (*) 70 - 99 mg/dL   Comment 1 Notify  RN      Imaging results:  Dg Chest 2 View  10/13/2012   *RADIOLOGY REPORT*  Clinical Data: Chest pain and difficulty breathing.  Dizziness.  CHEST - 2 VIEW  Comparison: Chest x-ray 07/21/2011.  Findings: Lung volumes are normal.  No consolidative airspace disease.  No pleural effusions. Mild diffuse peribronchial cuffing. No pneumothorax.  No pulmonary nodule or mass noted.  Pulmonary vasculature and the cardiomediastinal silhouette are within normal limits.  Atherosclerotic calcifications are noted within the arch of the aorta.  IMPRESSION: 1.  Mild diffuse peribronchial cuffing, without other acute abnormalities.  This may reflect a mild bronchitis. 2.  Atherosclerosis.   Original Report Authenticated By: Trudie Reed, M.D.    Assessment and Plan: I have seen and evaluated the patient as outlined above. I agree with the formulated Assessment and Plan as detailed in the residents' admission note, with the following changes:   1. Non cardiac CP - He R/O for ACS with 3 nl Trop I and EKG's. Since pain got better with GI cocktail, GI source like gastritis is highly likely in setting of chronic ETOH use. He is on a PPI at home. Unlikely that he has any sig GI bleed from gastritis / ulcer as HgB is nl. OK to D/C from cards standpoint.  2. Alcoholism and early withdrawal - we spent a lot of time discussing this. He wants to stop drinking but wants to do it himself, at home. He understands the risks and that DT's can be deadly. I encouraged him to contact his PCP to discuss outpt benzo tx PRN. I informed him to tell his ex-wife that szs or DT's need to be tx in hospital.   3. Dispo - home today.  Burns Spain, MD 6/14/201411:49 AM

## 2012-10-14 NOTE — Discharge Summary (Signed)
Name: [name] [name] MRN: [id number] DOB: 1950/06/17 62 y.o. PCP: Kaleen Mask, MD  Date of Admission: 10/13/2012 12:57 PM Date of Discharge: 10/14/2012 Attending Physician: No att. providers found  Discharge Diagnosis: Principal Problem:   Chest pain Active Problems:   Systolic CHF, chronic   Cirrhosis   Esophageal varices without mention of bleeding   Alcohol abuse  Discharge Medications:   Medication List    STOP taking these medications       meloxicam 15 MG tablet  Commonly known as:  MOBIC      TAKE these medications       allopurinol 300 MG tablet  Commonly known as:  ZYLOPRIM  Take 300 mg by mouth every 3 (three) days.     furosemide 40 MG tablet  Commonly known as:  LASIX  Take 40 mg by mouth daily.     multivitamin with minerals Tabs  Take 1 tablet by mouth daily.     nadolol 40 MG tablet  Commonly known as:  CORGARD  Take 1 tablet (40 mg total) by mouth daily.     omeprazole 20 MG capsule  Commonly known as:  PRILOSEC  Take 2 capsules (40 mg total) by mouth daily.     TESTOSTERONE IM  Inject 1.5 mLs into the muscle every 14 (fourteen) days.     traMADol 50 MG tablet  Commonly known as:  ULTRAM  Take 50 mg by mouth every 6 (six) hours as needed for pain.        Disposition and follow-up:   [name] was discharged from [facility] in Stable condition.  At the hospital follow up visit please address:  1.  Etoh cessation attempt or interest in resources, PPi compliance  2.  Labs / imaging needed at time of follow-up: none  3.  Pending labs/ test needing follow-up: none  Follow-up Appointments: Follow-up Information   Follow up with Kaleen Mask, MD In 2 days.   Contact information:   [address] [place] [address] [phone]       Discharge Instructions:   Consultations:    Procedures Performed:  Dg Chest 2 View  10/13/2012   *RADIOLOGY REPORT*  Clinical Data: Chest pain  and difficulty breathing.  Dizziness.  CHEST - 2 VIEW  Comparison: Chest x-ray 07/21/2011.  Findings: Lung volumes are normal.  No consolidative airspace disease.  No pleural effusions. Mild diffuse peribronchial cuffing. No pneumothorax.  No pulmonary nodule or mass noted.  Pulmonary vasculature and the cardiomediastinal silhouette are within normal limits.  Atherosclerotic calcifications are noted within the arch of the aorta.  IMPRESSION: 1.  Mild diffuse peribronchial cuffing, without other acute abnormalities.  This may reflect a mild bronchitis. 2.  Atherosclerosis.   Original Report Authenticated By: Trudie Reed, M.D.   Admission HPI: [name] is a 62 yo man pmh of HTN, liver disease with cirrhosis and grade III esophageal varices, and EtOH abuse p/w ongoing CP. Patient describes burning chest pain located over epigastric area, that does not radiate. Rated 10/10 in severity. Aggravated by drinking EtOH and eating. Alleviated by antacids and GI cocktail in ED. Pt denied any diaphoresis or associated nausea/vomiting/diarrhea. Pt has had some ongoing constipation. No fever/chills or recent sick contacts. Pt was a former smoker (~50 pack years) but quit in 1980s. Pt did obtain sobriety for the past 14 months but then given recent life stressors started again drinking at minimum 1/5th vodka a day. Pt continues  to receive testosterone injections that improve his energy level. No SOB or DOE and pt is able to tolerate daily walks. Pt has also had some easy bruseing lately. Pt denied lightheadness, dizziness, LOC, palpitations, abdominal pain, LE edema, or hematochezia/hemetemesis. Pt has had very serious EtOH withdrawal including DTs and seizures.    Hospital Course by problem list: # Atypical Chest Pain likely gastritis: Pt with recent large volume EtOH consumption possible gastritis as pt was not tachycardic indicating possible withdrawal and demand with some ischemia. Pt had full resolution of CP after  GI cocktail. Revised Geneva 3.0 making PE less likely. No infiltrates on CXR to indicate PNA or acute ? CHF exacerbation. Last Echo 3/13 EF 55-60% and no WMA but diastolic fxn couldn't be evaluated. No stress test available per chart review. TIMI 2/3 but ACS r/o with negative trops x3 and no changes on serial EKGs. Markedly elevated EtOH level and pt was maintained on CIWA protocol. Pt was d/c on PPI and d/c all NSAIDs and education and discussion of benefits of EtOH cessation was had with patient.   # Ethoic liver cirrohsis: MELD score:7. EGD with grade III esophageal varices. Pt has been vaccinated for HepA/B and pneumoniae. Pt had also been on Megace but d/c in 2013. Pt doesn't appear to be actively bleeding but at increased risk and having some constipation. Pt was continued on nadolol and senna-s. Extensive discussion in terms of EtOH cessation was had with patient. Pt would like to detox at home. Pt was made aware of all the risks and dangers including death that could occur if pt detoxs at home and not at an established center or hospital. Pt was competent and understood these risks but still insisted on d/c home. Pt was informed of all the warning symptoms of seizures and DTs and that pt should seek medical attention if these were to occur immediately. Pt verbalized understanding and was provided with resources by social worker before d/c.   #HTN: Pt wasn't able to take home medications on day of admission and pressures 170s/60s. Pt has extensive allergies to CCB, HCTZ, ACEi, and amlodipine. Therefore pt was continued on home lasix and nadolol.  Discharge Vitals:   BP 185/85  Pulse 67  Temp(Src) 98.2 F (36.8 C) (Oral)  Resp 18  Ht 5\' 6"  (1.676 m)  Wt 198 lb 3.2 oz (89.903 kg)  BMI 32.01 kg/m2  SpO2 94% General: resting in bed, NAD  HEENT: PERRL, EOMI, no scleral icterus  Cardiac: RRR, no rubs, murmurs or gallops  Pulm: clear to auscultation bilaterally, moving normal volumes of air  Abd:  soft, nontender, nondistended, BS present  Ext: warm and well perfused, no pedal edema  Neuro: alert and oriented X3, cranial nerves II-XII grossly intact  Discharge Labs:  Results for orders placed during the hospital encounter of 10/13/12 (from the past 24 hour(s))  ETHANOL     Status: Abnormal   Collection Time    10/13/12  3:05 PM      Result Value Range   Alcohol, Ethyl (B) 243 (*) 0 - 11 mg/dL  URINE RAPID DRUG SCREEN (HOSP PERFORMED)     Status: None   Collection Time    10/13/12  4:00 PM      Result Value Range   Opiates NONE DETECTED  NONE DETECTED   Cocaine NONE DETECTED  NONE DETECTED   Benzodiazepines NONE DETECTED  NONE DETECTED   Amphetamines NONE DETECTED  NONE DETECTED   Tetrahydrocannabinol NONE DETECTED  NONE DETECTED   Barbiturates NONE DETECTED  NONE DETECTED  PROTIME-INR     Status: None   Collection Time    10/13/12  5:52 PM      Result Value Range   Prothrombin Time 12.6  11.6 - 15.2 seconds   INR 0.95  0.00 - 1.49  MAGNESIUM     Status: None   Collection Time    10/13/12  5:52 PM      Result Value Range   Magnesium 2.4  1.5 - 2.5 mg/dL  PHOSPHORUS     Status: None   Collection Time    10/13/12  5:52 PM      Result Value Range   Phosphorus 2.4  2.3 - 4.6 mg/dL  TROPONIN I     Status: None   Collection Time    10/13/12  5:53 PM      Result Value Range   Troponin I <0.30  <0.30 ng/mL  TROPONIN I     Status: None   Collection Time    10/13/12 11:30 PM      Result Value Range   Troponin I <0.30  <0.30 ng/mL  TROPONIN I     Status: None   Collection Time    10/14/12  5:20 AM      Result Value Range   Troponin I <0.30  <0.30 ng/mL  COMPREHENSIVE METABOLIC PANEL     Status: Abnormal   Collection Time    10/14/12  5:20 AM      Result Value Range   Sodium 137  135 - 145 mEq/L   Potassium 3.9  3.5 - 5.1 mEq/L   Chloride 96  96 - 112 mEq/L   CO2 31  19 - 32 mEq/L   Glucose, Bld 99  70 - 99 mg/dL   BUN 14  6 - 23 mg/dL   Creatinine, Ser 8.29   0.50 - 1.35 mg/dL   Calcium 9.1  8.4 - 56.2 mg/dL   Total Protein 7.3  6.0 - 8.3 g/dL   Albumin 3.5  3.5 - 5.2 g/dL   AST 68 (*) 0 - 37 U/L   ALT 42  0 - 53 U/L   Alkaline Phosphatase 145 (*) 39 - 117 U/L   Total Bilirubin 1.2  0.3 - 1.2 mg/dL   GFR calc non Af Amer >90  >90 mL/min   GFR calc Af Amer >90  >90 mL/min  GLUCOSE, CAPILLARY     Status: Abnormal   Collection Time    10/14/12  6:27 AM      Result Value Range   Glucose-Capillary 101 (*) 70 - 99 mg/dL   Comment 1 Notify RN      Signed: Christen Bame, MD 10/14/2012, 2:32 PM   Time Spent on Discharge: 30 minutes Services Ordered on Discharge: none Equipment Ordered on Discharge: none

## 2012-10-14 NOTE — Progress Notes (Signed)
INTERNAL MEDICINE TEACHING SERVICE Night Float Progress Note   Subjective:    We were called overnight by the RN for evaluation of patient's increased agitation and elevated BP. At time of evaluation, pt reports feeling like he is '' jumping out of my skin'' with palpitations. No chest pain, no SOB. No hallucinations or other psychotic features. He also reports increased burning epigastric pain.   Objective:    BP 200/94  Pulse 67  Temp(Src) 97.5 F (36.4 C) (Oral)  Resp 18  Ht 5\' 6"  (1.676 m)  Wt 200 lb 4.8 oz (90.855 kg)  BMI 32.34 kg/m2  SpO2 95%   Labs: Basic Metabolic Panel:    Component Value Date/Time   NA 139 10/13/2012 1333   K 3.4* 10/13/2012 1333   CL 102 10/13/2012 1333   CO2 23 10/13/2012 1333   BUN 13 10/13/2012 1333   CREATININE 0.60 10/13/2012 1333   GLUCOSE 138* 10/13/2012 1333   CALCIUM 9.1 10/13/2012 1333    CBC:    Component Value Date/Time   WBC 11.1* 10/13/2012 1333   HGB 14.7 10/13/2012 1333   HCT 42.8 10/13/2012 1333   PLT 133* 10/13/2012 1333   MCV 88.1 10/13/2012 1333   NEUTROABS 8.3* 10/13/2012 1333   LYMPHSABS 1.4 10/13/2012 1333   MONOABS 1.3* 10/13/2012 1333   EOSABS 0.1 10/13/2012 1333   BASOSABS 0.0 10/13/2012 1333    Cardiac Enzymes: Lab Results  Component Value Date   CKTOTAL 40 07/22/2011   CKMB 1.1 07/22/2011   TROPONINI <0.30 10/13/2012    Physical Exam: General: Vital signs reviewed and noted. Well-developed, well-nourished, in no acute distress; alert, appropriate and cooperative throughout examination. Admits to excessive alcohol abuse.   Lungs:  Normal respiratory effort. Clear to auscultation BL without crackles or wheezes.  Heart: RRR. S1 and S2 normal without gallop, murmur, or rubs.  Abdomen:  BS normoactive. Soft, Nondistended, epigastric tenderness.  No masses or organomegaly.  Extremities: No pretibial edema.     Assessment/ Plan:    # Alcohol Withdraw: No features of DT. No CIWA Protocol with IV Ativan 1 mg q6hrs prn. It  appears he requires more frequent dosing and increased dosing.   Plan  - will give 2 mg ativan iv now, then  - increased to Ativan 2 mg q4hrs prn - will continue to monitor  # Gastritis: He reports relief with the gi cocktail given earlier. Continue with GI cocktail  Signed:  Dow Adolph, MD PGY-1 Internal Medicine Teaching Service Pager: [phone] (7pm-7am) 10/14/2012, 1:00 AM

## 2012-10-14 NOTE — Discharge Summary (Signed)
Physician Discharge Summary  Patient ID: [name] [name] MRN: [id number] DOB/AGE: 63/01/1951 63 y.o.  Admit date: 10/13/2012 Discharge date: 10/14/2012  Admission Diagnoses: Chest pain   Discharge Diagnoses: Alcoholic gastritis  Hospital Course: [name] [name] is a 62 yo man with PMH of alcoholism with history of complicated withdrawal and recent relapse after 43-month sobriety, esophageal varices, cirrhosis, and anemia, who presented to [facility] ED with chest pain. The chest pain was non-radiating and without accompanying diaphoresis, palpitations, nausea, vomiting, or SOB. Symptoms completely resolved in the ED after GI cocktail.  # Atypical Chest Pain likely gastritis: Pt with recent large volume EtOH consumption possible gastritis as pt is not tachycardic indicating possible withdrawal and demand with some ischemia. Pt had full resolution of CP after GI cocktail. Revised Geneva 3.0 making PE less likely. No infiltrates on CXR to indicate PNA or acute ? CHF exacerbation. Last Echo 3/13 EF 55-60% and no WMA but diastolic fxn couldn't be evaluated. No stress test available per chart review. TIMI 2/3 but ACS r/o with negative trops x3 and no changes on serial EKGs. Markedly elevated EtOH level.  -Gi cocktail with full resolution of symptoms -CIWA protocol - received 2 mg ativan IV for agitation overnight; 2 mg ativan q4h PRN agitation.  # Alcoholic liver cirrohsis: MELD score:7. EGD with grade III esophageal varices. Pt has been vaccinated for HepA/B and pneumoniae. Pt had also been on Megace but d/c in 2013. Pt doesn't appear to be actively bleeding but at increased risk and having some constipation.  -cont nadolol  -senna-s  -please see above for extensive discussion and pt decision   #HTN: Pt wasn't able to take home medications on day of admission and pressures 170s/60s. Pt has extensive allergies to CCB, HCTZ, ACEi, and amlodipine.  -cont home lasix and nadolol   Dispo: Current medical problems  stabilized. Discharge home today.   The patient does have a current PCP [name] [place] Elizebeth Koller, MD) and does need an [facility] hospital follow-up appointment after discharge.   The patient does not have transportation limitations that hinder transportation to clinic appointments.    Discharge Exam: Blood pressure 185/85, pulse 67, temperature 98.2 F (36.8 C), temperature source Oral, resp. rate 18, height 5\' 6"  (1.676 m), weight 89.903 kg (198 lb 3.2 oz), SpO2 94.00%. General appearance: alert, cooperative and no distress  Head: Normocephalic, without obvious abnormality, atraumatic  Eyes: conjunctivae/corneas clear. PERRL, EOM's intact. Fundi benign.  Neck: no adenopathy, no carotid bruit, no JVD, supple, symmetrical, trachea midline and thyroid not enlarged, symmetric, no tenderness/mass/nodules  Lungs: clear to auscultation bilaterally  Heart: regular rate and rhythm and systolic murmur: holosystolic 3/6, blowing at lower left sternal border  Abdomen: soft, non-tender; bowel sounds normal; no masses, no organomegaly  Pulses: 2+ and symmetric  Skin: mobility and turgor normal and no edema or angiomata - torso, abdomen  Neurologic: Grossly normal  Disposition: 01-Home or Self Care     Medication List    STOP taking these medications       meloxicam 15 MG tablet  Commonly known as:  MOBIC      TAKE these medications       allopurinol 300 MG tablet  Commonly known as:  ZYLOPRIM  Take 300 mg by mouth every 3 (three) days.     furosemide 40 MG tablet  Commonly known as:  LASIX  Take 40 mg by mouth daily.     multivitamin with minerals Tabs  Take 1 tablet by mouth daily.  nadolol 40 MG tablet  Commonly known as:  CORGARD  Take 1 tablet (40 mg total) by mouth daily.     omeprazole 20 MG capsule  Commonly known as:  PRILOSEC  Take 2 capsules (40 mg total) by mouth daily.     TESTOSTERONE IM  Inject 1.5 mLs into the muscle every 14 (fourteen) days.     traMADol 50 MG  tablet  Commonly known as:  ULTRAM  Take 50 mg by mouth every 6 (six) hours as needed for pain.         Signed: Claudine Mouton 10/14/2012, 10:10 AM

## 2012-10-14 NOTE — Progress Notes (Signed)
GI cocktail and Ativan effective.

## 2012-10-14 NOTE — Progress Notes (Signed)
Medical Student Daily Progress Note  Subjective:  Overnight patient received IV ativan 2 mg for agitation with ativan 2 mg q4h PRN agitation/withdrawal symptoms. This helped. GI cocktail continued to help abdominal pain. Patient voiced desire to quit drinking alcohol with support of ex-wife. In the past has been to 30 day detox center without success, but was able to self-detox at home (with 57-month sobriety). This was discussed at length with patient regarding how to manage withdrawal symptoms and when to report to the ED. Patient voiced understanding of risks of home detox given his history of complicated withdrawal. Patient encouraged to contact his PCP (Dr. Jeannetta Nap) for support.  Constitutional: Denies fever, chills, diaphoresis, appetite change and fatigue.  HEENT: Denies photophobia, eye pain, redness, hearing loss, ear pain, congestion, sore throat, rhinorrhea, sneezing, mouth sores, trouble swallowing, neck pain, neck stiffness and tinnitus.   Respiratory: Denies SOB, DOE, cough, chest tightness, and wheezing.   Cardiovascular: Denies chest pain, palpitations and leg swelling.  Gastrointestinal: Denies nausea, vomiting, diarrhea, constipation, blood in stool and abdominal distention. Admits abdominal pain that is relieved by GI cocktail. Genitourinary: Denies dysuria, urgency, frequency, hematuria, flank pain and difficulty urinating.  Endocrine: Denies hot or cold intolerance, sweats, changes in hair or nails, polyuria, polydipsia. Musculoskeletal: Denies myalgias, back pain, joint swelling, arthralgias and gait problem.  Skin: Denies pallor, rash and wound.  Neurological: Denies dizziness, seizures, syncope, weakness, light-headedness, numbness and headaches.  Hematological: Denies adenopathy, easy bruising, personal or family bleeding history.  Psychiatric/Behavioral: Denies suicidal ideation, mood changes, confusion, nervousness, sleep disturbance. Admits  agitation.   Objective:  Vital signs in last 24 hours: Filed Vitals:   10/13/12 1731 10/13/12 2100 10/13/12 2349 10/14/12 0631  BP: 165/89 188/88 200/94 185/85  Pulse: 81 72 67 67  Temp: 98.5 F (36.9 C) 98.3 F (36.8 C) 97.5 F (36.4 C) 98.2 F (36.8 C)  TempSrc: Oral Oral Oral Oral  Resp: 17 18 18 18   Height:      Weight:    89.903 kg (198 lb 3.2 oz)  SpO2: 96% 94% 95% 94%    Weight change:    Intake/Output Summary (Last 24 hours) at 10/14/12 0917 Last data filed at 10/14/12 0600  Gross per 24 hour  Intake      0 ml  Output   2075 ml  Net  -2075 ml    Physical Exam: BP 185/85  Pulse 67  Temp(Src) 98.2 F (36.8 C) (Oral)  Resp 18  Ht 5\' 6"  (1.676 m)  Wt 89.903 kg (198 lb 3.2 oz)  BMI 32.01 kg/m2  SpO2 94% General appearance: alert, cooperative and no distress Head: Normocephalic, without obvious abnormality, atraumatic Eyes: conjunctivae/corneas clear. PERRL, EOM's intact. Fundi benign. Neck: no adenopathy, no carotid bruit, no JVD, supple, symmetrical, trachea midline and thyroid not enlarged, symmetric, no tenderness/mass/nodules Lungs: clear to auscultation bilaterally Heart: regular rate and rhythm and systolic murmur: holosystolic 3/6, blowing at lower left sternal border Abdomen: soft, non-tender; bowel sounds normal; no masses,  no organomegaly Pulses: 2+ and symmetric Skin: mobility and turgor normal and no edema or angiomata - torso, abdomen Neurologic: Grossly normal  Lab Results: Admission on 10/13/2012  Component Date Value Range Status  . WBC 10/13/2012 11.1* 4.0 - 10.5 K/uL Final  . RBC 10/13/2012 4.86  4.22 - 5.81 MIL/uL Final  . Hemoglobin 10/13/2012 14.7  13.0 - 17.0 g/dL Final  . HCT 16/02/9603 42.8  39.0 - 52.0 % Final  . MCV 10/13/2012 88.1  78.0 -  100.0 fL Final  . MCH 10/13/2012 30.2  26.0 - 34.0 pg Final  . MCHC 10/13/2012 34.3  30.0 - 36.0 g/dL Final  . RDW 05/05/7251 15.8* 11.5 - 15.5 % Final  . Platelets 10/13/2012 133* 150 -  400 K/uL Final  . Neutrophils Relative % 10/13/2012 75  43 - 77 % Final  . Neutro Abs 10/13/2012 8.3* 1.7 - 7.7 K/uL Final  . Lymphocytes Relative 10/13/2012 12  12 - 46 % Final  . Lymphs Abs 10/13/2012 1.4  0.7 - 4.0 K/uL Final  . Monocytes Relative 10/13/2012 12  3 - 12 % Final  . Monocytes Absolute 10/13/2012 1.3* 0.1 - 1.0 K/uL Final  . Eosinophils Relative 10/13/2012 1  0 - 5 % Final  . Eosinophils Absolute 10/13/2012 0.1  0.0 - 0.7 K/uL Final  . Basophils Relative 10/13/2012 0  0 - 1 % Final  . Basophils Absolute 10/13/2012 0.0  0.0 - 0.1 K/uL Final  . Sodium 10/13/2012 139  135 - 145 mEq/L Final  . Potassium 10/13/2012 3.4* 3.5 - 5.1 mEq/L Final  . Chloride 10/13/2012 102  96 - 112 mEq/L Final  . CO2 10/13/2012 23  19 - 32 mEq/L Final  . Glucose, Bld 10/13/2012 138* 70 - 99 mg/dL Final  . BUN 66/44/0347 13  6 - 23 mg/dL Final  . Creatinine, Ser 10/13/2012 0.60  0.50 - 1.35 mg/dL Final  . Calcium 42/59/5638 9.1  8.4 - 10.5 mg/dL Final  . GFR calc non Af Amer 10/13/2012 >90  >90 mL/min Final  . GFR calc Af Amer 10/13/2012 >90  >90 mL/min Final  . Troponin i, poc 10/13/2012 0.01  0.00 - 0.08 ng/mL Final  . Comment 3 10/13/2012          Final  . Alcohol, Ethyl (B) 10/13/2012 243* 0 - 11 mg/dL Final  . Opiates 75/64/3329 NONE DETECTED  NONE DETECTED Final  . Cocaine 10/13/2012 NONE DETECTED  NONE DETECTED Final  . Benzodiazepines 10/13/2012 NONE DETECTED  NONE DETECTED Final  . Amphetamines 10/13/2012 NONE DETECTED  NONE DETECTED Final  . Tetrahydrocannabinol 10/13/2012 NONE DETECTED  NONE DETECTED Final  . Barbiturates 10/13/2012 NONE DETECTED  NONE DETECTED Final  . Prothrombin Time 10/13/2012 12.6  11.6 - 15.2 seconds Final  . INR 10/13/2012 0.95  0.00 - 1.49 Final  . Troponin I 10/13/2012 <0.30  <0.30 ng/mL Final  . Troponin I 10/13/2012 <0.30  <0.30 ng/mL Final  . Troponin I 10/14/2012 <0.30  <0.30 ng/mL Final  . Magnesium 10/13/2012 2.4  1.5 - 2.5 mg/dL Final  . Phosphorus  51/88/4166 2.4  2.3 - 4.6 mg/dL Final  . Sodium 10/31/1599 137  135 - 145 mEq/L Final  . Potassium 10/14/2012 3.9  3.5 - 5.1 mEq/L Final  . Chloride 10/14/2012 96  96 - 112 mEq/L Final  . CO2 10/14/2012 31  19 - 32 mEq/L Final  . Glucose, Bld 10/14/2012 99  70 - 99 mg/dL Final  . BUN 09/32/3557 14  6 - 23 mg/dL Final  . Creatinine, Ser 10/14/2012 0.77  0.50 - 1.35 mg/dL Final  . Calcium 32/20/2542 9.1  8.4 - 10.5 mg/dL Final  . Total Protein 10/14/2012 7.3  6.0 - 8.3 g/dL Final  . Albumin 70/62/3762 3.5  3.5 - 5.2 g/dL Final  . AST 83/15/1761 68* 0 - 37 U/L Final  . ALT 10/14/2012 42  0 - 53 U/L Final  . Alkaline Phosphatase 10/14/2012 145* 39 - 117 U/L Final  .  Total Bilirubin 10/14/2012 1.2  0.3 - 1.2 mg/dL Final  . GFR calc non Af Amer 10/14/2012 >90  >90 mL/min Final  . GFR calc Af Amer 10/14/2012 >90  >90 mL/min Final  . Glucose-Capillary 10/14/2012 101* 70 - 99 mg/dL Final  . Comment 1 16/02/9603 Notify RN   Final    Micro Results: No results found for this or any previous visit (from the past 240 hour(s)).  Studies/Results: Dg Chest 2 View  10/13/2012   *RADIOLOGY REPORT*  Clinical Data: Chest pain and difficulty breathing.  Dizziness.  CHEST - 2 VIEW  Comparison: Chest x-ray 07/21/2011.  Findings: Lung volumes are normal.  No consolidative airspace disease.  No pleural effusions. Mild diffuse peribronchial cuffing. No pneumothorax.  No pulmonary nodule or mass noted.  Pulmonary vasculature and the cardiomediastinal silhouette are within normal limits.  Atherosclerotic calcifications are noted within the arch of the aorta.  IMPRESSION: 1.  Mild diffuse peribronchial cuffing, without other acute abnormalities.  This may reflect a mild bronchitis. 2.  Atherosclerosis.   Original Report Authenticated By: Trudie Reed, M.D.    Medications: I have reviewed the patient's current medications. Scheduled Meds: . allopurinol  300 mg Oral Q72H  . folic acid  1 mg Oral Daily  .  furosemide  40 mg Oral Daily  . heparin  5,000 Units Subcutaneous Q8H  . multivitamin with minerals  1 tablet Oral Daily  . nadolol  40 mg Oral Daily  . pantoprazole  40 mg Oral BID  . sodium chloride  3 mL Intravenous Q12H  . thiamine  100 mg Oral Daily   Or  . thiamine  100 mg Intravenous Daily   Continuous Infusions:  PRN Meds:.LORazepam, LORazepam, traMADol  Assessment/Plan: Principal Problem:   Chest pain Active Problems:   Systolic CHF, chronic   Cirrhosis   Esophageal varices without mention of bleeding   Alcohol abuse    LOS: 1 day   [name] [name] is a 62 yo alcoholic man with a PMH of complicated withdrawal, cirrhosis with grade 3 esophageal varices, CHF, and anemia who presents to [facility] ED with several hours of sustained chest pain. Presentation is concerning for ACS vs. Aortic dissection vs. PNA vs. Pneumothorax vs. Pancreatitis vs. Esophageal rupture vs. Alcoholic gastritis vs. PUD. Pain resolved with receipt of GI cocktail, strongly suggesting gastritis or PUD. Infectious etiology (PNA, gastroenteritis) less likely due to afebrile, no proceeding illness, no diarrhea. WBC mildly elevated. Lack of respiratory symptoms make pneumothorax less likely. Resolution of symptoms with GI treatment makes pancreatitis, ACS, aortic dissection much less likely. Troponins negative x3; EKG no acute events. CXR showed mild peribronchial cuffing that could be consistent with a mild bronchitis. Pulmonary exam was normal.   #Chest pain: Most likely gastritis from alcohol abuse given resolution of symptoms with GI cocktail. Alcohol level 243 on admission. Still need to rule out ACS.  -- Admit to floor for overnight OBS to rule out ACS.  -- CIWA protocol. Received 2 mg ativan IV overnight for agitation and was scheduled for ativan 2 mg q4h PRN agitation/withdrawal. -- Trops neg x3.  -- EKG, CXR.  -- Avoid NSAIDs.  -- Restart home antihypertensives (did not take this AM) - see below.  -- GI  cocktail - resolves symptoms.  #Alcoholism: Patient wants to stop drinking cold-turkey (has been successful previously) at ex-wife's home, and ex-wife has agreed to this. Extended counseling regarding symptoms to expect and management of withdrawal, including when to report to the ED, and  patient verbalized understanding. Discharge home today. Encouraged to contact Dr. Jeannetta Nap (PCP) for support.  #Dyspnea: Progressive dyspnea on exertion concerning for ACS vs. CHF exacerbation vs. COPD. Patient is currently breathing comfortably and is without peripheral edema or JVD. Pulmonary exam unremarkable. 12/2009 2D echo showed 20-25% LVEF, dilated left ventricular wall with diffuse hypokinesis. 07/2011 2D echo showed LVEF 60% without wall motion abnormalities. CXR no acute infiltrates or processes.  -- Continue to monitor for now.   #Cirrhosis with grade 3 esophageal varices: Unable to palpate liver on exam. No ascites, jaundice. Extensive bruising and patient reported easy bruising/bleeding concerning for thrombocytopenia and decreased clotting factors 2/2 end-stage liver disease.  -- PT/INR 12.6/0.95  -- Nadolol for varices.   #HTN: BP mildly elevated to 170s systolic on admission likely 2/2 chronic alcohol abuse and missing medications this AM. Restart home antihypertensives. If patient needs additional medical therapy to control HTN this will be difficult due to extensive allergies to antihypertensives (HCTZ, enalapril, beta-blockers, CCB).  -- Lasix  -- Nadolol   #DVT: Platelets 133 on admission.  -- Heparin  Dispo: D/C home today. Patient has PCP (Dr. Jeannetta Nap) and was encouraged to contact him for support.  This is a Psychologist, occupational Note.  The care of the patient was discussed with Dr. Rogelia Boga and the assessment and plan formulated with their assistance.  Please see their attached note for official documentation of the daily encounter.  Claudine Mouton 10/14/2012, 9:17 AM

## 2012-10-17 NOTE — ED Provider Notes (Signed)
Medical screening examination/treatment/procedure(s) were conducted as a shared visit with non-physician practitioner(s) and myself.  I personally evaluated the patient during the encounter  Pt unreliable and intoxicated reporting CP Recommended admission due to his history/age for cardiac workup/monitoring   Joya Gaskins, MD 10/17/12 [phone]

## 2012-12-01 ENCOUNTER — Encounter: Payer: Self-pay | Admitting: Gastroenterology

## 2013-01-22 ENCOUNTER — Ambulatory Visit (INDEPENDENT_AMBULATORY_CARE_PROVIDER_SITE_OTHER): Payer: Medicare Other | Admitting: Gastroenterology

## 2013-01-22 DIAGNOSIS — K7460 Unspecified cirrhosis of liver: Secondary | ICD-10-CM

## 2013-01-22 DIAGNOSIS — Z23 Encounter for immunization: Secondary | ICD-10-CM

## 2013-02-19 ENCOUNTER — Telehealth: Payer: Self-pay | Admitting: Gastroenterology

## 2013-02-19 NOTE — Telephone Encounter (Signed)
Pt given the name Twinrix for the injection and questions answered.

## 2013-09-06 ENCOUNTER — Encounter: Payer: Self-pay | Admitting: Gastroenterology

## 2013-09-06 ENCOUNTER — Telehealth: Payer: Self-pay | Admitting: Gastroenterology

## 2013-09-06 NOTE — Telephone Encounter (Signed)
Pt was due for recall colon in August 2014. Pt wants to know if he is due for an EGD also. Please advise.

## 2013-09-06 NOTE — Telephone Encounter (Signed)
Yes, he is due for EGD as well

## 2013-11-08 ENCOUNTER — Encounter: Payer: Medicare Other | Admitting: Gastroenterology

## 2014-02-09 ENCOUNTER — Emergency Department (HOSPITAL_COMMUNITY): Payer: No Typology Code available for payment source

## 2014-02-09 ENCOUNTER — Inpatient Hospital Stay (HOSPITAL_COMMUNITY)
Admission: EM | Admit: 2014-02-09 | Discharge: 2014-03-05 | DRG: 957 | Disposition: A | Discharge status: Rehabilitation Facility | Payer: No Typology Code available for payment source | Attending: General Surgery | Admitting: General Surgery

## 2014-02-09 ENCOUNTER — Encounter (HOSPITAL_COMMUNITY): Payer: Self-pay | Admitting: General Surgery

## 2014-02-09 ENCOUNTER — Inpatient Hospital Stay (HOSPITAL_COMMUNITY): Payer: No Typology Code available for payment source

## 2014-02-09 DIAGNOSIS — R1310 Dysphagia, unspecified: Secondary | ICD-10-CM | POA: Diagnosis not present

## 2014-02-09 DIAGNOSIS — S32009A Unspecified fracture of unspecified lumbar vertebra, initial encounter for closed fracture: Secondary | ICD-10-CM | POA: Diagnosis present

## 2014-02-09 DIAGNOSIS — S40819A Abrasion of unspecified upper arm, initial encounter: Secondary | ICD-10-CM | POA: Diagnosis present

## 2014-02-09 DIAGNOSIS — Z6841 Body Mass Index (BMI) 40.0 and over, adult: Secondary | ICD-10-CM | POA: Diagnosis not present

## 2014-02-09 DIAGNOSIS — J939 Pneumothorax, unspecified: Secondary | ICD-10-CM

## 2014-02-09 DIAGNOSIS — M25462 Effusion, left knee: Secondary | ICD-10-CM

## 2014-02-09 DIAGNOSIS — S32018A Other fracture of first lumbar vertebra, initial encounter for closed fracture: Secondary | ICD-10-CM | POA: Diagnosis present

## 2014-02-09 DIAGNOSIS — S272XXA Traumatic hemopneumothorax, initial encounter: Secondary | ICD-10-CM

## 2014-02-09 DIAGNOSIS — S270XXA Traumatic pneumothorax, initial encounter: Secondary | ICD-10-CM | POA: Diagnosis present

## 2014-02-09 DIAGNOSIS — S2249XA Multiple fractures of ribs, unspecified side, initial encounter for closed fracture: Secondary | ICD-10-CM | POA: Diagnosis present

## 2014-02-09 DIAGNOSIS — S36892A Contusion of other intra-abdominal organs, initial encounter: Secondary | ICD-10-CM | POA: Diagnosis present

## 2014-02-09 DIAGNOSIS — S92532A Displaced fracture of distal phalanx of left lesser toe(s), initial encounter for closed fracture: Secondary | ICD-10-CM | POA: Diagnosis present

## 2014-02-09 DIAGNOSIS — I10 Essential (primary) hypertension: Secondary | ICD-10-CM | POA: Diagnosis present

## 2014-02-09 DIAGNOSIS — T1490XA Injury, unspecified, initial encounter: Secondary | ICD-10-CM

## 2014-02-09 DIAGNOSIS — Z4659 Encounter for fitting and adjustment of other gastrointestinal appliance and device: Secondary | ICD-10-CM

## 2014-02-09 DIAGNOSIS — R40243 Glasgow coma scale score 3-8: Secondary | ICD-10-CM | POA: Diagnosis present

## 2014-02-09 DIAGNOSIS — J90 Pleural effusion, not elsewhere classified: Secondary | ICD-10-CM

## 2014-02-09 DIAGNOSIS — Z79899 Other long term (current) drug therapy: Secondary | ICD-10-CM | POA: Diagnosis not present

## 2014-02-09 DIAGNOSIS — R1084 Generalized abdominal pain: Secondary | ICD-10-CM

## 2014-02-09 DIAGNOSIS — F1010 Alcohol abuse, uncomplicated: Secondary | ICD-10-CM | POA: Diagnosis present

## 2014-02-09 DIAGNOSIS — J9600 Acute respiratory failure, unspecified whether with hypoxia or hypercapnia: Secondary | ICD-10-CM

## 2014-02-09 DIAGNOSIS — J9690 Respiratory failure, unspecified, unspecified whether with hypoxia or hypercapnia: Secondary | ICD-10-CM

## 2014-02-09 DIAGNOSIS — E668 Other obesity: Secondary | ICD-10-CM | POA: Diagnosis present

## 2014-02-09 DIAGNOSIS — S82852A Displaced trimalleolar fracture of left lower leg, initial encounter for closed fracture: Principal | ICD-10-CM | POA: Diagnosis present

## 2014-02-09 DIAGNOSIS — S272XXS Traumatic hemopneumothorax, sequela: Secondary | ICD-10-CM

## 2014-02-09 DIAGNOSIS — T1490 Injury, unspecified: Secondary | ICD-10-CM | POA: Diagnosis present

## 2014-02-09 DIAGNOSIS — T85598A Other mechanical complication of other gastrointestinal prosthetic devices, implants and grafts, initial encounter: Secondary | ICD-10-CM

## 2014-02-09 DIAGNOSIS — S27322A Contusion of lung, bilateral, initial encounter: Secondary | ICD-10-CM | POA: Diagnosis present

## 2014-02-09 DIAGNOSIS — S0101XA Laceration without foreign body of scalp, initial encounter: Secondary | ICD-10-CM | POA: Diagnosis present

## 2014-02-09 DIAGNOSIS — S80819A Abrasion, unspecified lower leg, initial encounter: Secondary | ICD-10-CM | POA: Diagnosis present

## 2014-02-09 DIAGNOSIS — S32009S Unspecified fracture of unspecified lumbar vertebra, sequela: Secondary | ICD-10-CM

## 2014-02-09 DIAGNOSIS — S069X4A Unspecified intracranial injury with loss of consciousness of 6 hours to 24 hours, initial encounter: Secondary | ICD-10-CM

## 2014-02-09 DIAGNOSIS — Z4682 Encounter for fitting and adjustment of non-vascular catheter: Secondary | ICD-10-CM

## 2014-02-09 DIAGNOSIS — Y92410 Unspecified street and highway as the place of occurrence of the external cause: Secondary | ICD-10-CM

## 2014-02-09 DIAGNOSIS — N179 Acute kidney failure, unspecified: Secondary | ICD-10-CM | POA: Diagnosis present

## 2014-02-09 DIAGNOSIS — D62 Acute posthemorrhagic anemia: Secondary | ICD-10-CM | POA: Diagnosis not present

## 2014-02-09 DIAGNOSIS — E870 Hyperosmolality and hypernatremia: Secondary | ICD-10-CM | POA: Diagnosis not present

## 2014-02-09 DIAGNOSIS — I469 Cardiac arrest, cause unspecified: Secondary | ICD-10-CM | POA: Diagnosis present

## 2014-02-09 DIAGNOSIS — R410 Disorientation, unspecified: Secondary | ICD-10-CM | POA: Diagnosis not present

## 2014-02-09 DIAGNOSIS — S298XXA Other specified injuries of thorax, initial encounter: Secondary | ICD-10-CM | POA: Diagnosis present

## 2014-02-09 DIAGNOSIS — Z9889 Other specified postprocedural states: Secondary | ICD-10-CM

## 2014-02-09 DIAGNOSIS — Z0189 Encounter for other specified special examinations: Secondary | ICD-10-CM

## 2014-02-09 HISTORY — DX: Polyp of colon: K63.5

## 2014-02-09 HISTORY — DX: Personal history of other diseases of the nervous system and sense organs: Z86.69

## 2014-02-09 HISTORY — DX: Unspecified cirrhosis of liver: K74.60

## 2014-02-09 HISTORY — DX: Esophageal varices without bleeding: I85.00

## 2014-02-09 LAB — I-STAT ARTERIAL BLOOD GAS, ED
Acid-base deficit: 11 mmol/L — ABNORMAL HIGH (ref 0.0–2.0)
Bicarbonate: 19.4 meq/L — ABNORMAL LOW (ref 20.0–24.0)
O2 Saturation: 94 %
PCO2 ART: 59.8 mmHg — AB (ref 35.0–45.0)
PO2 ART: 94 mmHg (ref 80.0–100.0)
TCO2: 21 mmol/L (ref 0–100)
pH, Arterial: 7.119 — CL (ref 7.350–7.450)

## 2014-02-09 LAB — I-STAT CHEM 8, ED
BUN: 28 mg/dL — AB (ref 6–23)
CALCIUM ION: 1.04 mmol/L — AB (ref 1.13–1.30)
Chloride: 107 mEq/L (ref 96–112)
Creatinine, Ser: 1.4 mg/dL — ABNORMAL HIGH (ref 0.50–1.35)
GLUCOSE: 278 mg/dL — AB (ref 70–99)
HCT: 47 % (ref 39.0–52.0)
Hemoglobin: 16 g/dL (ref 13.0–17.0)
Potassium: 4 mEq/L (ref 3.7–5.3)
Sodium: 141 mEq/L (ref 137–147)
TCO2: 21 mmol/L (ref 0–100)

## 2014-02-09 LAB — POCT I-STAT 3, ART BLOOD GAS (G3+)
ACID-BASE DEFICIT: 8 mmol/L — AB (ref 0.0–2.0)
BICARBONATE: 19.7 meq/L — AB (ref 20.0–24.0)
O2 Saturation: 99 %
TCO2: 21 mmol/L (ref 0–100)
pCO2 arterial: 50.6 mmHg — ABNORMAL HIGH (ref 35.0–45.0)
pH, Arterial: 7.203 — ABNORMAL LOW (ref 7.350–7.450)
pO2, Arterial: 168 mmHg — ABNORMAL HIGH (ref 80.0–100.0)

## 2014-02-09 LAB — MRSA PCR SCREENING: MRSA BY PCR: NEGATIVE

## 2014-02-09 LAB — ABO/RH: ABO/RH(D): A POS

## 2014-02-09 MED ORDER — SUCCINYLCHOLINE CHLORIDE 20 MG/ML IJ SOLN
INTRAMUSCULAR | Status: AC
  Filled 2014-02-09: qty 1

## 2014-02-09 MED ORDER — CHLORHEXIDINE GLUCONATE 0.12 % MT SOLN
15.0000 mL | Freq: Two times a day (BID) | OROMUCOSAL | Status: DC
  Administered 2014-02-09 – 2014-03-05 (×47): 15 mL via OROMUCOSAL
  Filled 2014-02-09 (×47): qty 15

## 2014-02-09 MED ORDER — PANTOPRAZOLE SODIUM 40 MG IV SOLR
40.0000 mg | Freq: Every day | INTRAVENOUS | Status: DC
  Administered 2014-02-09 – 2014-02-25 (×16): 40 mg via INTRAVENOUS
  Filled 2014-02-09 (×22): qty 40

## 2014-02-09 MED ORDER — SODIUM CHLORIDE 0.9 % IV SOLN
INTRAVENOUS | Status: DC
  Administered 2014-02-09 – 2014-02-10 (×2): via INTRAVENOUS
  Administered 2014-02-10: 125 mL/h via INTRAVENOUS
  Administered 2014-02-11 (×2): via INTRAVENOUS

## 2014-02-09 MED ORDER — FENTANYL CITRATE 0.05 MG/ML IJ SOLN
100.0000 ug | INTRAMUSCULAR | Status: AC | PRN
  Administered 2014-02-09 (×3): 100 ug via INTRAVENOUS
  Filled 2014-02-09: qty 2

## 2014-02-09 MED ORDER — FENTANYL CITRATE 0.05 MG/ML IJ SOLN
100.0000 ug | INTRAMUSCULAR | Status: DC | PRN
  Administered 2014-02-09 – 2014-02-11 (×16): 100 ug via INTRAVENOUS
  Filled 2014-02-09 (×17): qty 2

## 2014-02-09 MED ORDER — ROCURONIUM BROMIDE 50 MG/5ML IV SOLN
INTRAVENOUS | Status: AC | PRN
  Administered 2014-02-09: 100 mg via INTRAVENOUS

## 2014-02-09 MED ORDER — MIDAZOLAM HCL 2 MG/2ML IJ SOLN
1.0000 mg | INTRAMUSCULAR | Status: DC | PRN
  Administered 2014-02-10 – 2014-02-23 (×35): 1 mg via INTRAVENOUS
  Filled 2014-02-09 (×35): qty 2

## 2014-02-09 MED ORDER — ETOMIDATE 2 MG/ML IV SOLN
INTRAVENOUS | Status: AC | PRN
  Administered 2014-02-09: 20 mg via INTRAVENOUS

## 2014-02-09 MED ORDER — ROCURONIUM BROMIDE 50 MG/5ML IV SOLN
INTRAVENOUS | Status: AC
  Filled 2014-02-09: qty 2

## 2014-02-09 MED ORDER — SODIUM CHLORIDE 0.9 % IV SOLN
INTRAVENOUS | Status: AC | PRN
  Administered 2014-02-09: 1000 mL via INTRAVENOUS

## 2014-02-09 MED ORDER — FENTANYL CITRATE 0.05 MG/ML IJ SOLN
INTRAMUSCULAR | Status: AC
  Filled 2014-02-09: qty 2

## 2014-02-09 MED ORDER — IOHEXOL 300 MG/ML  SOLN
100.0000 mL | Freq: Once | INTRAMUSCULAR | Status: AC | PRN
  Administered 2014-02-09: 100 mL via INTRAVENOUS

## 2014-02-09 MED ORDER — FENTANYL CITRATE 0.05 MG/ML IJ SOLN: 100.0000 ug | INTRAMUSCULAR | Status: DC | PRN

## 2014-02-09 MED ORDER — PANTOPRAZOLE SODIUM 40 MG PO TBEC
40.0000 mg | DELAYED_RELEASE_TABLET | Freq: Every day | ORAL | Status: DC
  Administered 2014-02-17 – 2014-02-26 (×2): 40 mg via ORAL
  Filled 2014-02-09: qty 1

## 2014-02-09 MED ORDER — CETYLPYRIDINIUM CHLORIDE 0.05 % MT LIQD
7.0000 mL | Freq: Four times a day (QID) | OROMUCOSAL | Status: DC
  Administered 2014-02-10 – 2014-03-05 (×94): 7 mL via OROMUCOSAL

## 2014-02-09 MED ORDER — LIDOCAINE HCL (CARDIAC) 20 MG/ML IV SOLN
INTRAVENOUS | Status: AC
  Filled 2014-02-09: qty 5

## 2014-02-09 MED ORDER — ETOMIDATE 2 MG/ML IV SOLN
INTRAVENOUS | Status: AC
  Filled 2014-02-09: qty 20

## 2014-02-09 MED ORDER — MIDAZOLAM HCL 2 MG/2ML IJ SOLN
INTRAMUSCULAR | Status: AC
  Filled 2014-02-09: qty 2

## 2014-02-09 NOTE — ED Notes (Addendum)
Pt. Was moving his hands, Pt. Trying to open his eyes to voice. Pt. Grimacing,. Comfort given  Medicated per orders with Fentanyl 179mcg.
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
## 2014-02-09 NOTE — ED Notes (Signed)
Pt. Returned from CT.

## 2014-02-09 NOTE — Progress Notes (Signed)
Orthopedic Tech Progress Note Patient Details:  Janet [facility]ces[facility] 08/27/1950 [id number]  Ortho Devices Type of Ortho Device: Ace wrap;Post (short leg) splint;Stirrup splint Ortho Device/Splint Location: ller Ortho Device/Splint Interventions: Application As ordered by Dr. Ammie Dalton, Phelicia Dantes 02/09/2014, 8:18 PM

## 2014-02-09 NOTE — ED Notes (Signed)
Pt. s ex-wife arrived Placed in family room with Chaplain

## 2014-02-09 NOTE — ED Notes (Signed)
Protocol chem 8 results given to Dr. Alvino Chapel
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
## 2014-02-09 NOTE — Progress Notes (Signed)
Chaplain returned when pt's partner (also POA) arrived. Pt's partner has her own health concerns. Chaplain got graham crackers for partner and was present when dr updated her on pt's status. Chaplain offered supportive presence while partner processed her shock at hearing the pt's condition. Chaplain escorted partner's brother to consult room. Partner's sister arrived as well. Chaplain accompanied partner and her siblings to see pt in Trauma B and assisted with communication with medical staff. When chaplain left, partner and siblings were waiting in consult room to see where pt will be moved. At family's request, chaplain will try to check in in a little while.  Vanetta Mulders 02/09/2014 4:16 PM
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
## 2014-02-09 NOTE — Progress Notes (Signed)
Upon boosting the patient straight up in bed, chest tube disconnected from [place] tubing. Immediately clamped until distal end placed into sterile water seal within one minute. Bilateral breath sounds auscultated and oxygen sats at 98% on ventilator. Dr. Molli Posey notified. Instructed to reattach to new [place]. No further orders. Will continue to assess.

## 2014-02-09 NOTE — Progress Notes (Signed)
Chaplain responded to ED page. No family present; chaplain told family en route to hospital. Chaplain requested that ED staff page chaplain when family arrives.  Vanetta Mulders 02/09/2014 2:32 PM

## 2014-02-09 NOTE — ED Notes (Signed)
Pt. To CT scan accompanied by RN and EMT

## 2014-02-09 NOTE — ED Notes (Signed)
Pt had numerous abrasions on pt's left forearm from elbow to wrist including top of left hand, index and middle fingers, pt's left shin and ankle, pt's forehead on left side right above eyebrow; they were cleaned with a combination of hydrogen peroxide and normal saline, patted dry with sterile gauzes, bacitracin applied, sterile 4x4s and  nonadherent pads were placed, wrapped with gauze and taped; the abrasions on pt's left side of chin and bottom lip were cleaned also and bacitracin applied

## 2014-02-10 ENCOUNTER — Inpatient Hospital Stay (HOSPITAL_COMMUNITY): Payer: No Typology Code available for payment source

## 2014-02-10 LAB — TYPE AND SCREEN
ABO/RH(D): A POS
Antibody Screen: NEGATIVE
UNIT DIVISION: 0
Unit division: 0

## 2014-02-10 LAB — COMPREHENSIVE METABOLIC PANEL
ALK PHOS: 76 U/L (ref 39–117)
ALT: 27 U/L (ref 0–53)
AST: 36 U/L (ref 0–37)
Albumin: 2.6 g/dL — ABNORMAL LOW (ref 3.5–5.2)
Anion gap: 13 (ref 5–15)
BILIRUBIN TOTAL: 0.6 mg/dL (ref 0.3–1.2)
BUN: 25 mg/dL — ABNORMAL HIGH (ref 6–23)
CHLORIDE: 113 meq/L — AB (ref 96–112)
CO2: 21 mEq/L (ref 19–32)
Calcium: 7.2 mg/dL — ABNORMAL LOW (ref 8.4–10.5)
Creatinine, Ser: 1.27 mg/dL (ref 0.50–1.35)
GFR calc Af Amer: 67 mL/min — ABNORMAL LOW (ref >90)
GFR calc non Af Amer: 58 mL/min — ABNORMAL LOW (ref >90)
Glucose, Bld: 159 mg/dL — ABNORMAL HIGH (ref 70–99)
POTASSIUM: 4.4 meq/L (ref 3.7–5.3)
Sodium: 147 mEq/L (ref 137–147)
Total Protein: 5.5 g/dL — ABNORMAL LOW (ref 6.0–8.3)

## 2014-02-10 LAB — CBC
HCT: 34.5 % — ABNORMAL LOW (ref 39.0–52.0)
Hemoglobin: 11.5 g/dL — ABNORMAL LOW (ref 13.0–17.0)
MCH: 32 pg (ref 26.0–34.0)
MCHC: 33.3 g/dL (ref 30.0–36.0)
MCV: 96.1 fL (ref 78.0–100.0)
PLATELETS: 156 10*3/uL (ref 150–400)
RBC: 3.59 MIL/uL — ABNORMAL LOW (ref 4.22–5.81)
RDW: 16.7 % — ABNORMAL HIGH (ref 11.5–15.5)
WBC: 9.1 10*3/uL (ref 4.0–10.5)

## 2014-02-10 LAB — PROTIME-INR
INR: 1.21 (ref 0.00–1.49)
Prothrombin Time: 15.3 seconds — ABNORMAL HIGH (ref 11.6–15.2)

## 2014-02-10 MED ORDER — SODIUM CHLORIDE 0.9 % IV BOLUS (SEPSIS)
500.0000 mL | Freq: Once | INTRAVENOUS | Status: AC
  Administered 2014-02-10: 500 mL via INTRAVENOUS

## 2014-02-10 MED ORDER — FOLIC ACID 1 MG PO TABS
1.0000 mg | ORAL_TABLET | Freq: Every day | ORAL | Status: DC
  Administered 2014-02-10 – 2014-02-22 (×12): 1 mg via ORAL
  Filled 2014-02-10 (×14): qty 1

## 2014-02-10 MED ORDER — DEXMEDETOMIDINE HCL IN NACL 200 MCG/50ML IV SOLN
0.2000 ug/kg/h | INTRAVENOUS | Status: AC
  Administered 2014-02-10: 0.5 ug/kg/h via INTRAVENOUS
  Administered 2014-02-10: 0.3 ug/kg/h via INTRAVENOUS
  Administered 2014-02-10: 0.4 ug/kg/h via INTRAVENOUS
  Administered 2014-02-11 (×3): 0.6 ug/kg/h via INTRAVENOUS
  Filled 2014-02-10 (×7): qty 50

## 2014-02-10 MED ORDER — VITAMIN B-1 100 MG PO TABS
100.0000 mg | ORAL_TABLET | Freq: Every day | ORAL | Status: DC
  Administered 2014-02-10 – 2014-02-22 (×12): 100 mg via ORAL
  Filled 2014-02-10 (×14): qty 1

## 2014-02-10 MED ORDER — ADULT MULTIVITAMIN W/MINERALS CH
1.0000 | ORAL_TABLET | Freq: Every day | ORAL | Status: DC
  Administered 2014-02-10 – 2014-02-22 (×12): 1 via ORAL
  Filled 2014-02-10 (×14): qty 1

## 2014-02-10 MED ORDER — SODIUM CHLORIDE 0.9 % IV SOLN
50.0000 ug/h | INTRAVENOUS | Status: DC
  Administered 2014-02-10 – 2014-02-11 (×2): 100 ug/h via INTRAVENOUS
  Administered 2014-02-11: 50 ug/h via INTRAVENOUS
  Administered 2014-02-12 – 2014-02-13 (×4): 300 ug/h via INTRAVENOUS
  Administered 2014-02-14: 250 ug/h via INTRAVENOUS
  Administered 2014-02-15 (×2): 300 ug/h via INTRAVENOUS
  Administered 2014-02-15: 200 ug/h via INTRAVENOUS
  Administered 2014-02-16 – 2014-02-17 (×3): 100 ug/h via INTRAVENOUS
  Administered 2014-02-18: 220 ug/h via INTRAVENOUS
  Administered 2014-02-18 – 2014-02-19 (×2): 100 ug/h via INTRAVENOUS
  Administered 2014-02-19: 200 ug/h via INTRAVENOUS
  Administered 2014-02-19: 50 ug/h via INTRAVENOUS
  Administered 2014-02-19: 100 ug/h via INTRAVENOUS
  Administered 2014-02-20: 200 ug/h via INTRAVENOUS
  Administered 2014-02-20: 75 ug/h via INTRAVENOUS
  Administered 2014-02-21 – 2014-02-22 (×3): 150 ug/h via INTRAVENOUS
  Filled 2014-02-10 (×22): qty 50
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
## 2014-02-10 NOTE — Progress Notes (Signed)
Follow up - Trauma and Critical Care  Patient Details:    [name] [name] is an 63 y.o. male.  Lines/tubes : Airway 7.5 mm (Active)  Secured at (cm) 24 cm 02/10/2014  8:32 AM  Measured From Lips 02/10/2014  8:32 AM  Secured Location Right 02/10/2014  8:32 AM  Secured By [employer] 02/10/2014  8:32 AM  Tube Holder Repositioned Yes 02/10/2014  8:32 AM  Cuff Pressure (cm H2O) 26 cm H2O 02/10/2014  3:36 AM  Site Condition Dry 02/10/2014  8:32 AM     Chest Tube Left Pleural (Active)  Suction -20 cm H2O 02/09/2014  8:30 PM  Chest Tube Air Leak None 02/09/2014  8:30 PM  Drainage Description Bright red 02/09/2014  8:30 PM  Dressing Status Dry;Intact;Old drainage 02/09/2014  8:00 PM  Site Assessment Other (Comment) 02/09/2014  8:00 PM  Surrounding Skin Unable to view 02/09/2014  8:00 PM  Output (mL) 40 mL 02/10/2014  6:00 AM     NG/OG Tube Orogastric 16 Fr. Right mouth (Active)  Placement Verification Auscultation 02/09/2014  8:00 PM  Site Assessment Clean;Dry;Intact 02/09/2014  8:00 PM  Status Irrigated;Clamped 02/09/2014  8:00 PM     Urethral Catheter Varney Biles, RN Temperature probe 16 Fr. (Active)  Indication for Insertion or Continuance of Catheter Unstable critical patients (first 24-48 hours) 02/09/2014  8:00 PM  Site Assessment Clean;Intact;Dry 02/09/2014  8:00 PM  Catheter Maintenance Bag below level of bladder;Catheter secured;Drainage bag/tubing not touching floor;No dependent loops;Seal intact;Insertion date on drainage bag 02/09/2014  8:00 PM  Collection Container Standard drainage bag 02/09/2014  8:00 PM  Securement Method Leg strap 02/09/2014  8:00 PM  Urinary Catheter Interventions Unclamped 02/09/2014  8:00 PM  Output (mL) 60 mL 02/10/2014  6:00 AM    Microbiology/Sepsis markers: Results for orders placed during the hospital encounter of 02/09/14  MRSA PCR SCREENING     Status: None   Collection Time    02/09/14  6:02 PM      Result Value Ref Range Status    MRSA by PCR NEGATIVE  NEGATIVE Final   Comment:            The GeneXpert MRSA Assay (FDA     approved for NASAL specimens     only), is one component of a     comprehensive MRSA colonization     surveillance program. It is not     intended to diagnose MRSA     infection nor to guide or     monitor treatment for     MRSA infections.    Anti-infectives:  Anti-infectives   None      Best Practice/Protocols:  VTE Prophylaxis: Lovenox (prophylaxtic dose) and Mechanical GI Prophylaxis: Proton Pump Inhibitor Continous Sedation  Consults: Treatment Team:  Augustin Schooling, MD    Events:  Subjective:    Overnight Issues: Patient still very agitated on the ventilator.  Wants to be extubated.  RR increases with alertness  Objective:  Vital signs for last 24 hours: Temp:  [95.9 F (35.5 C)-100.6 F (38.1 C)] 99.9 F (37.7 C) (10/11 0700) Pulse Rate:  [65-82] 70 (10/11 0832) Resp:  [11-29] 24 (10/11 0832) BP: (69-180)/(40-105) 133/85 mmHg (10/11 0700) SpO2:  [92 %-100 %] 97 % (10/11 0832) FiO2 (%):  [50 %-100 %] 50 % (10/11 0902) Weight:  [95.7 kg (210 lb 15.7 oz)-117.935 kg (260 lb)] 95.7 kg (210 lb 15.7 oz) (10/10 1800)  Hemodynamic parameters for last 24 hours:  Intake/Output from previous day: 10/10 0701 - 10/11 0700 In: 2500 [I.V.:2500] Out: 1070 [Urine:1000; Chest Tube:70]  Intake/Output this shift:    Vent settings for last 24 hours: Vent Mode:  [-] PRVC FiO2 (%):  [50 %-100 %] 50 % Set Rate:  [15 bmp-22 bmp] 20 bmp Vt Set:  [520 mL-550 mL] 550 mL PEEP:  [5 cmH20] 5 cmH20 Pressure Support:  [5 cmH20] 5 cmH20 Plateau Pressure:  [20 YSA63-01 cmH20] 20 cmH20  Physical Exam:  General: alert Neuro: alert, oriented, nonfocal exam and agitated Resp: wheezes LUL GI: soft, nontender, BS WNL, no r/g Extremities: no edema, no erythema, pulses WNL  Results for orders placed during the hospital encounter of 02/09/14 (from the past 24 hour(s))  PREPARE  FRESH FROZEN PLASMA     Status: None   Collection Time    02/09/14  1:16 PM      Result Value Ref Range   Unit Number [id number]     Blood Component Type THAWED PLASMA     Unit division 00     Status of Unit REL FROM [facility]     Unit tag comment VERBAL ORDERS PER DR PICKERING     Transfusion Status OK TO TRANSFUSE     Unit Number [id number]     Blood Component Type THAWED PLASMA     Unit division 00     Status of Unit REL FROM [facility]     Unit tag comment VERBAL ORDERS PER DR PICKERING     Transfusion Status OK TO TRANSFUSE    TYPE AND SCREEN     Status: None   Collection Time    02/09/14  1:45 PM      Result Value Ref Range   ABO/RH(D) A POS     Antibody Screen NEG     Sample Expiration 02/12/2014     Unit Number [id number]     Blood Component Type RBC CPDA1, LR     Unit division 00     Status of Unit REL FROM [facility]     Unit tag comment VERBAL ORDERS PER DR PICKERING     Transfusion Status PENDING     Crossmatch Result NOT NEEDED     Unit Number [id number]     Blood Component Type RBC CPDA1, LR     Unit division 00     Status of Unit REL FROM [facility]     Unit tag comment VERBAL ORDERS PER DR PICKERING     Transfusion Status PENDING     Crossmatch Result NOT NEEDED    ABO/RH     Status: None   Collection Time    02/09/14  1:45 PM      Result Value Ref Range   ABO/RH(D) A POS    I-STAT CHEM 8, ED     Status: Abnormal   Collection Time    02/09/14  1:59 PM      Result Value Ref Range   Sodium 141  137 - 147 mEq/L   Potassium 4.0  3.7 - 5.3 mEq/L   Chloride 107  96 - 112 mEq/L   BUN 28 (*) 6 - 23 mg/dL   Creatinine, Ser 1.40 (*) 0.50 - 1.35 mg/dL   Glucose, Bld 278 (*) 70 - 99 mg/dL   Calcium, Ion 1.04 (*) 1.13 - 1.30 mmol/L   TCO2 21  0 - 100 mmol/L   Hemoglobin 16.0  13.0 - 17.0 g/dL   HCT 47.0  39.0 - 52.0 %  I-STAT  ARTERIAL BLOOD GAS, ED     Status: Abnormal   Collection Time    02/09/14  4:09 PM      Result Value Ref Range   pH, Arterial 7.119 (*)  7.350 - 7.450   pCO2 arterial 59.8 (*) 35.0 - 45.0 mmHg   pO2, Arterial 94.0  80.0 - 100.0 mmHg   Bicarbonate 19.4 (*) 20.0 - 24.0 mEq/L   TCO2 21  0 - 100 mmol/L   O2 Saturation 94.0     Acid-base deficit 11.0 (*) 0.0 - 2.0 mmol/L   Collection site RADIAL, ALLEN'S TEST ACCEPTABLE     Drawn by RT     Sample type ARTERIAL     Comment NOTIFIED PHYSICIAN    MRSA PCR SCREENING     Status: None   Collection Time    02/09/14  6:02 PM      Result Value Ref Range   MRSA by PCR NEGATIVE  NEGATIVE  POCT I-STAT 3, ART BLOOD GAS (G3+)     Status: Abnormal   Collection Time    02/09/14  8:36 PM      Result Value Ref Range   pH, Arterial 7.203 (*) 7.350 - 7.450   pCO2 arterial 50.6 (*) 35.0 - 45.0 mmHg   pO2, Arterial 168.0 (*) 80.0 - 100.0 mmHg   Bicarbonate 19.7 (*) 20.0 - 24.0 mEq/L   TCO2 21  0 - 100 mmol/L   O2 Saturation 99.0     Acid-base deficit 8.0 (*) 0.0 - 2.0 mmol/L   Patient temperature 38.0 C     Collection site RADIAL, ALLEN'S TEST ACCEPTABLE     Drawn by RT     Sample type ARTERIAL    CBC     Status: Abnormal   Collection Time    02/10/14  2:30 AM      Result Value Ref Range   WBC 9.1  4.0 - 10.5 K/uL   RBC 3.59 (*) 4.22 - 5.81 MIL/uL   Hemoglobin 11.5 (*) 13.0 - 17.0 g/dL   HCT 34.5 (*) 39.0 - 52.0 %   MCV 96.1  78.0 - 100.0 fL   MCH 32.0  26.0 - 34.0 pg   MCHC 33.3  30.0 - 36.0 g/dL   RDW 16.7 (*) 11.5 - 15.5 %   Platelets 156  150 - 400 K/uL  COMPREHENSIVE METABOLIC PANEL     Status: Abnormal   Collection Time    02/10/14  2:30 AM      Result Value Ref Range   Sodium 147  137 - 147 mEq/L   Potassium 4.4  3.7 - 5.3 mEq/L   Chloride 113 (*) 96 - 112 mEq/L   CO2 21  19 - 32 mEq/L   Glucose, Bld 159 (*) 70 - 99 mg/dL   BUN 25 (*) 6 - 23 mg/dL   Creatinine, Ser 1.27  0.50 - 1.35 mg/dL   Calcium 7.2 (*) 8.4 - 10.5 mg/dL   Total Protein 5.5 (*) 6.0 - 8.3 g/dL   Albumin 2.6 (*) 3.5 - 5.2 g/dL   AST 36  0 - 37 U/L   ALT 27  0 - 53 U/L   Alkaline Phosphatase 76   39 - 117 U/L   Total Bilirubin 0.6  0.3 - 1.2 mg/dL   GFR calc non Af Amer 58 (*) >90 mL/min   GFR calc Af Amer 67 (*) >90 mL/min   Anion gap 13  5 - 15  PROTIME-INR  Status: Abnormal   Collection Time    02/10/14  2:30 AM      Result Value Ref Range   Prothrombin Time 15.3 (*) 11.6 - 15.2 seconds   INR 1.21  0.00 - 1.49     Assessment/Plan:   NEURO  Agitated and sedated intermittently.  Seems a bit shakey as in ETOH withdrawal   Plan: ICU ETOH withdrawal protocol  PULM  Chest Wall Trauma multiple rib fractures   Plan: No changes  CARDIO  No signficiant issues   Plan: No changes  RENAL  Urine output is adequate   Plan: No changes for now  GI  No acute issues   Plan: CPM  ID  No known infectiious problems   Plan: CPM  HEME  Anemia acute blood loss anemia)   Plan: Moderate anemia, will check again tomorrow.  ENDO No known issues   Plan: CPM  Global Issues  Would like to extubate this patient but the patient seems too shaky.  Will sedate per ETOH ICU withdrawal protocol check labs tomorrow.    LOS: 1 day   Additional comments:I reviewed the patient's new clinical lab test results. cbc/bmet and I reviewed the patients new imaging test results. cxr  Critical Care Total Time*: 30 Minutes  Trenisha Lafavor, JAY 02/10/2014  *Care during the described time interval was provided by me and/or other providers on the critical care team.  I have reviewed this patient's available data, including medical history, events of note, physical examination and test results as part of my evaluation.

## 2014-02-11 ENCOUNTER — Inpatient Hospital Stay (HOSPITAL_COMMUNITY): Payer: No Typology Code available for payment source

## 2014-02-11 LAB — POCT I-STAT 3, ART BLOOD GAS (G3+)
ACID-BASE DEFICIT: 3 mmol/L — AB (ref 0.0–2.0)
Bicarbonate: 22.1 meq/L (ref 20.0–24.0)
O2 SAT: 91 %
Patient temperature: 100.7
TCO2: 23 mmol/L (ref 0–100)
pCO2 arterial: 43.3 mmHg (ref 35.0–45.0)
pH, Arterial: 7.322 — ABNORMAL LOW (ref 7.350–7.450)
pO2, Arterial: 71 mmHg — ABNORMAL LOW (ref 80.0–100.0)

## 2014-02-11 LAB — CBC WITH DIFFERENTIAL/PLATELET
BASOS ABS: 0 10*3/uL (ref 0.0–0.1)
BASOS PCT: 0 % (ref 0–1)
EOS PCT: 1 % (ref 0–5)
Eosinophils Absolute: 0 10*3/uL (ref 0.0–0.7)
HEMATOCRIT: 28.4 % — AB (ref 39.0–52.0)
Hemoglobin: 9.4 g/dL — ABNORMAL LOW (ref 13.0–17.0)
Lymphocytes Relative: 15 % (ref 12–46)
Lymphs Abs: 0.8 10*3/uL (ref 0.7–4.0)
MCH: 32.1 pg (ref 26.0–34.0)
MCHC: 33.1 g/dL (ref 30.0–36.0)
MCV: 96.9 fL (ref 78.0–100.0)
MONO ABS: 0.5 10*3/uL (ref 0.1–1.0)
MONOS PCT: 10 % (ref 3–12)
Neutro Abs: 3.7 10*3/uL (ref 1.7–7.7)
Neutrophils Relative %: 74 % (ref 43–77)
Platelets: 91 10*3/uL — ABNORMAL LOW (ref 150–400)
RBC: 2.93 MIL/uL — ABNORMAL LOW (ref 4.22–5.81)
RDW: 16.9 % — AB (ref 11.5–15.5)
WBC: 5.1 10*3/uL (ref 4.0–10.5)

## 2014-02-11 LAB — CBC
HCT: 30 % — ABNORMAL LOW (ref 39.0–52.0)
HEMOGLOBIN: 9.6 g/dL — AB (ref 13.0–17.0)
MCH: 31.5 pg (ref 26.0–34.0)
MCHC: 32 g/dL (ref 30.0–36.0)
MCV: 98.4 fL (ref 78.0–100.0)
Platelets: 105 10*3/uL — ABNORMAL LOW (ref 150–400)
RBC: 3.05 MIL/uL — ABNORMAL LOW (ref 4.22–5.81)
RDW: 17 % — AB (ref 11.5–15.5)
WBC: 5.8 10*3/uL (ref 4.0–10.5)

## 2014-02-11 LAB — TROPONIN I

## 2014-02-11 LAB — BASIC METABOLIC PANEL
ANION GAP: 10 (ref 5–15)
BUN: 28 mg/dL — AB (ref 6–23)
CALCIUM: 7 mg/dL — AB (ref 8.4–10.5)
CO2: 20 mEq/L (ref 19–32)
CREATININE: 0.89 mg/dL (ref 0.50–1.35)
Chloride: 115 mEq/L — ABNORMAL HIGH (ref 96–112)
GFR calc Af Amer: >90 mL/min (ref >90)
GFR calc non Af Amer: 88 mL/min — ABNORMAL LOW (ref >90)
Glucose, Bld: 133 mg/dL — ABNORMAL HIGH (ref 70–99)
Potassium: 4.3 mEq/L (ref 3.7–5.3)
Sodium: 145 mEq/L (ref 137–147)

## 2014-02-11 LAB — PREPARE FRESH FROZEN PLASMA
UNIT DIVISION: 0
Unit division: 0

## 2014-02-11 LAB — GLUCOSE, CAPILLARY
Glucose-Capillary: 118 mg/dL — ABNORMAL HIGH (ref 70–99)
Glucose-Capillary: 134 mg/dL — ABNORMAL HIGH (ref 70–99)

## 2014-02-11 MED ORDER — PRO-STAT SUGAR FREE PO LIQD
30.0000 mL | Freq: Every day | ORAL | Status: DC
  Administered 2014-02-11 – 2014-02-14 (×11): 30 mL
  Administered 2014-02-14: 22:00:00
  Administered 2014-02-14 – 2014-02-16 (×11): 30 mL
  Administered 2014-02-16: 06:00:00
  Administered 2014-02-17 – 2014-02-23 (×31): 30 mL
  Filled 2014-02-11 (×65): qty 30

## 2014-02-11 MED ORDER — DEXMEDETOMIDINE HCL IN NACL 200 MCG/50ML IV SOLN
0.2000 ug/kg/h | INTRAVENOUS | Status: DC
  Administered 2014-02-11 (×4): 0.6 ug/kg/h via INTRAVENOUS
  Administered 2014-02-12 – 2014-02-13 (×8): 0.7 ug/kg/h via INTRAVENOUS
  Administered 2014-02-13: 0.698 ug/kg/h via INTRAVENOUS
  Administered 2014-02-13: 0.7 ug/kg/h via INTRAVENOUS
  Administered 2014-02-13: 0.698 ug/kg/h via INTRAVENOUS
  Administered 2014-02-13 (×2): 0.7 ug/kg/h via INTRAVENOUS
  Administered 2014-02-14: 0.6 ug/kg/h via INTRAVENOUS
  Administered 2014-02-14: 0.5 ug/kg/h via INTRAVENOUS
  Filled 2014-02-11 (×19): qty 50

## 2014-02-11 MED ORDER — SELENIUM 50 MCG PO TABS
200.0000 ug | ORAL_TABLET | Freq: Every day | ORAL | Status: AC
  Administered 2014-02-11 – 2014-02-17 (×6): 200 ug
  Filled 2014-02-11 (×7): qty 4

## 2014-02-11 MED ORDER — VITAMIN C 500 MG PO TABS
1000.0000 mg | ORAL_TABLET | Freq: Three times a day (TID) | ORAL | Status: AC
  Administered 2014-02-11 – 2014-02-17 (×19): 1000 mg
  Filled 2014-02-11 (×21): qty 2

## 2014-02-11 MED ORDER — CEFAZOLIN SODIUM-DEXTROSE 2-3 GM-% IV SOLR
2.0000 g | INTRAVENOUS | Status: AC
  Administered 2014-02-12: 2 g via INTRAVENOUS
  Filled 2014-02-11: qty 50

## 2014-02-11 MED ORDER — PIVOT 1.5 CAL PO LIQD
1000.0000 mL | ORAL | Status: DC
Start: 2014-02-12 — End: 2014-02-23
  Administered 2014-02-13 – 2014-02-22 (×12): 1000 mL
  Filled 2014-02-11 (×15): qty 1000

## 2014-02-11 MED ORDER — ALBUMIN HUMAN 5 % IV SOLN
25.0000 g | Freq: Once | INTRAVENOUS | Status: AC
  Administered 2014-02-11: 25 g via INTRAVENOUS
  Filled 2014-02-11: qty 500

## 2014-02-11 MED ORDER — PIVOT 1.5 CAL PO LIQD
1000.0000 mL | ORAL | Status: DC
  Filled 2014-02-11 (×3): qty 1000

## 2014-02-11 MED ORDER — IPRATROPIUM-ALBUTEROL 0.5-2.5 (3) MG/3ML IN SOLN
3.0000 mL | RESPIRATORY_TRACT | Status: DC
  Administered 2014-02-11 – 2014-02-21 (×62): 3 mL via RESPIRATORY_TRACT
  Filled 2014-02-11 (×62): qty 3

## 2014-02-11 NOTE — Procedures (Signed)
Arterial Catheter Insertion Procedure Note [name] [name] [id number] 1950/12/22  Procedure: Insertion of Arterial Catheter  Indications: Blood pressure monitoring and Frequent blood sampling  Procedure Details Consent: Unable to obtain consent because of emergent medical necessity. Pt. Intubated on ventilator. Time Out: Verified patient identification, verified procedure, site/side was marked, verified correct patient position, special equipment/implants available, medications/allergies/relevent history reviewed, required imaging and test results available.  Performed  Maximum sterile technique was used including cap, gloves, gown, hand hygiene, mask and sheet. Skin prep: Chlorhexidine; local anesthetic administered 20 gauge catheter was inserted into right radial artery using the Seldinger technique.  Evaluation Blood flow good; BP tracing good. Complications: No apparent complications.  Assisted by [name] [name], RT   [name] [name] 02/11/2014
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
## 2014-02-11 NOTE — Progress Notes (Signed)
Chaplain followed up with pt after being present w/ pts family in ED. Pt at times seemed aware of chaplain's presence, and sometimes seemed to respond to what chaplain said. Chaplain introduced herself and offered silent presence at pt's bedside. Will follow as needed.  Vanetta Mulders 02/11/2014 4:20 PM

## 2014-02-11 NOTE — Progress Notes (Signed)
UR completed.  Anuja Manka, RN BSN [facility] CCM Trauma/Neuro ICU Case Manager [phone]  
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
## 2014-02-12 ENCOUNTER — Inpatient Hospital Stay (HOSPITAL_COMMUNITY): Payer: No Typology Code available for payment source

## 2014-02-12 ENCOUNTER — Inpatient Hospital Stay (HOSPITAL_COMMUNITY): Payer: No Typology Code available for payment source | Admitting: Anesthesiology

## 2014-02-12 ENCOUNTER — Encounter (HOSPITAL_COMMUNITY): Admission: EM | Disposition: A | Discharge status: Rehabilitation Facility | Payer: Self-pay | Source: Home / Self Care

## 2014-02-12 ENCOUNTER — Encounter (HOSPITAL_COMMUNITY): Payer: Self-pay | Admitting: Anesthesiology

## 2014-02-12 ENCOUNTER — Encounter (HOSPITAL_COMMUNITY): Payer: No Typology Code available for payment source | Admitting: Anesthesiology

## 2014-02-12 HISTORY — PX: ORIF ANKLE FRACTURE: SHX5408

## 2014-02-12 HISTORY — PX: PERCUTANEOUS PINNING: SHX2209

## 2014-02-12 LAB — CBC
HCT: 28.4 % — ABNORMAL LOW (ref 39.0–52.0)
HEMATOCRIT: 26.2 % — AB (ref 39.0–52.0)
HEMOGLOBIN: 9.1 g/dL — AB (ref 13.0–17.0)
Hemoglobin: 8.4 g/dL — ABNORMAL LOW (ref 13.0–17.0)
MCH: 31.5 pg (ref 26.0–34.0)
MCH: 31.6 pg (ref 26.0–34.0)
MCHC: 32.1 g/dL (ref 30.0–36.0)
MCHC: 32 g/dL (ref 30.0–36.0)
MCV: 98.1 fL (ref 78.0–100.0)
MCV: 98.6 fL (ref 78.0–100.0)
Platelets: 104 10*3/uL — ABNORMAL LOW (ref 150–400)
Platelets: 125 10*3/uL — ABNORMAL LOW (ref 150–400)
RBC: 2.67 MIL/uL — ABNORMAL LOW (ref 4.22–5.81)
RBC: 2.88 MIL/uL — ABNORMAL LOW (ref 4.22–5.81)
RDW: 16.7 % — AB (ref 11.5–15.5)
RDW: 16.4 % — ABNORMAL HIGH (ref 11.5–15.5)
WBC: 4.1 10*3/uL (ref 4.0–10.5)
WBC: 5.4 10*3/uL (ref 4.0–10.5)

## 2014-02-12 LAB — BLOOD GAS, ARTERIAL
Acid-base deficit: 3.4 mmol/L — ABNORMAL HIGH (ref 0.0–2.0)
Bicarbonate: 21.6 mEq/L (ref 20.0–24.0)
DRAWN BY: 36277
FIO2: 40 %
LHR: 20 {breaths}/min
MECHVT: 550 mL
O2 Saturation: 91.2 %
PCO2 ART: 41.9 mmHg (ref 35.0–45.0)
PEEP: 5 cmH2O
PO2 ART: 63.4 mmHg — AB (ref 80.0–100.0)
Patient temperature: 98.6
TCO2: 22.9 mmol/L (ref 0–100)
pH, Arterial: 7.331 — ABNORMAL LOW (ref 7.350–7.450)

## 2014-02-12 LAB — GLUCOSE, CAPILLARY
GLUCOSE-CAPILLARY: 105 mg/dL — AB (ref 70–99)
GLUCOSE-CAPILLARY: 110 mg/dL — AB (ref 70–99)
GLUCOSE-CAPILLARY: 115 mg/dL — AB (ref 70–99)
GLUCOSE-CAPILLARY: 118 mg/dL — AB (ref 70–99)
GLUCOSE-CAPILLARY: 121 mg/dL — AB (ref 70–99)
GLUCOSE-CAPILLARY: 125 mg/dL — AB (ref 70–99)
Glucose-Capillary: 150 mg/dL — ABNORMAL HIGH (ref 70–99)

## 2014-02-12 LAB — BASIC METABOLIC PANEL
Anion gap: 11 (ref 5–15)
BUN: 20 mg/dL (ref 6–23)
CO2: 22 mEq/L (ref 19–32)
CREATININE: 0.86 mg/dL (ref 0.50–1.35)
Calcium: 7.3 mg/dL — ABNORMAL LOW (ref 8.4–10.5)
Chloride: 118 mEq/L — ABNORMAL HIGH (ref 96–112)
GFR, EST NON AFRICAN AMERICAN: 90 mL/min — AB (ref >90)
Glucose, Bld: 124 mg/dL — ABNORMAL HIGH (ref 70–99)
Potassium: 4 mEq/L (ref 3.7–5.3)
Sodium: 151 mEq/L — ABNORMAL HIGH (ref 137–147)

## 2014-02-12 SURGERY — OPEN REDUCTION INTERNAL FIXATION (ORIF) ANKLE FRACTURE
Anesthesia: General | Site: Toe | Laterality: Left

## 2014-02-12 MED ORDER — LIDOCAINE HCL (CARDIAC) 20 MG/ML IV SOLN
INTRAVENOUS | Status: AC
  Filled 2014-02-12: qty 5

## 2014-02-12 MED ORDER — BACITRACIN ZINC 500 UNIT/GM EX OINT
TOPICAL_OINTMENT | CUTANEOUS | Status: DC | PRN
  Administered 2014-02-12: 1 via TOPICAL

## 2014-02-12 MED ORDER — ROCURONIUM BROMIDE 50 MG/5ML IV SOLN
INTRAVENOUS | Status: AC
  Filled 2014-02-12: qty 1

## 2014-02-12 MED ORDER — DEXTROSE IN LACTATED RINGERS 5 % IV SOLN
INTRAVENOUS | Status: DC
  Administered 2014-02-12: 08:00:00 via INTRAVENOUS

## 2014-02-12 MED ORDER — LACTATED RINGERS IV SOLN
INTRAVENOUS | Status: DC | PRN
  Administered 2014-02-12: 14:00:00 via INTRAVENOUS

## 2014-02-12 MED ORDER — CEFAZOLIN SODIUM-DEXTROSE 2-3 GM-% IV SOLR
INTRAVENOUS | Status: AC
  Filled 2014-02-12: qty 50

## 2014-02-12 MED ORDER — DEXMEDETOMIDINE HCL IN NACL 200 MCG/50ML IV SOLN
INTRAVENOUS | Status: DC | PRN
  Administered 2014-02-12: .7 ug/kg/h via INTRAVENOUS

## 2014-02-12 MED ORDER — DEXTROSE-NACL 5-0.45 % IV SOLN
INTRAVENOUS | Status: DC
  Administered 2014-02-12 – 2014-02-13 (×2): via INTRAVENOUS
  Administered 2014-02-13: 75 mL/h via INTRAVENOUS
  Administered 2014-02-14: 1000 mL via INTRAVENOUS
  Administered 2014-02-16: 23:00:00 via INTRAVENOUS
  Administered 2014-02-17: 75 mL via INTRAVENOUS
  Administered 2014-02-19 – 2014-03-02 (×14): via INTRAVENOUS

## 2014-02-12 MED ORDER — SODIUM CHLORIDE 0.9 % IV SOLN
2500.0000 ug | INTRAVENOUS | Status: DC | PRN
  Administered 2014-02-12: 300 ug/h via INTRAVENOUS

## 2014-02-12 MED ORDER — PROPOFOL 10 MG/ML IV BOLUS
INTRAVENOUS | Status: AC
  Filled 2014-02-12: qty 20

## 2014-02-12 MED ORDER — ROCURONIUM BROMIDE 100 MG/10ML IV SOLN
INTRAVENOUS | Status: DC | PRN
  Administered 2014-02-12: 30 mg via INTRAVENOUS

## 2014-02-12 MED ORDER — MIDAZOLAM HCL 5 MG/5ML IJ SOLN
INTRAMUSCULAR | Status: DC | PRN
Start: 2014-02-12 — End: 2014-02-12
  Administered 2014-02-12: 2 mg via INTRAVENOUS

## 2014-02-12 MED ORDER — BACITRACIN ZINC 500 UNIT/GM EX OINT
TOPICAL_OINTMENT | CUTANEOUS | Status: AC
  Filled 2014-02-12: qty 15

## 2014-02-12 MED ORDER — CEFAZOLIN SODIUM-DEXTROSE 2-3 GM-% IV SOLR
INTRAVENOUS | Status: DC | PRN
  Administered 2014-02-12: 2 g via INTRAVENOUS

## 2014-02-12 MED ORDER — FUROSEMIDE 10 MG/ML IJ SOLN
20.0000 mg | Freq: Once | INTRAMUSCULAR | Status: AC
  Administered 2014-02-12: 20 mg via INTRAVENOUS
  Filled 2014-02-12: qty 2

## 2014-02-12 MED ORDER — ALBUMIN HUMAN 5 % IV SOLN
INTRAVENOUS | Status: DC | PRN
  Administered 2014-02-12: 15:00:00 via INTRAVENOUS

## 2014-02-12 MED ORDER — MIDAZOLAM HCL 2 MG/2ML IJ SOLN
INTRAMUSCULAR | Status: AC
  Filled 2014-02-12: qty 2

## 2014-02-12 MED ORDER — FENTANYL CITRATE 0.05 MG/ML IJ SOLN
INTRAMUSCULAR | Status: AC
  Filled 2014-02-12: qty 5

## 2014-02-12 SURGICAL SUPPLY — 69 items
BANDAGE ESMARK 6X9 LF (GAUZE/BANDAGES/DRESSINGS) ×2 IMPLANT
BIT DRILL 2.5X2.75 QC CALB (BIT) ×4 IMPLANT
BLADE SURG 15 STRL LF DISP TIS (BLADE) ×2 IMPLANT
BLADE SURG 15 STRL SS (BLADE) ×2
BNDG COHESIVE 4X5 TAN STRL (GAUZE/BANDAGES/DRESSINGS) ×4 IMPLANT
BNDG COHESIVE 6X5 TAN STRL LF (GAUZE/BANDAGES/DRESSINGS) ×4 IMPLANT
BNDG ESMARK 6X9 LF (GAUZE/BANDAGES/DRESSINGS) ×4
CANISTER SUCT 3000ML (MISCELLANEOUS) ×4 IMPLANT
CHLORAPREP W/TINT 26ML (MISCELLANEOUS) IMPLANT
COVER SURGICAL LIGHT HANDLE (MISCELLANEOUS) ×4 IMPLANT
CUFF TOURNIQUET SINGLE 34IN LL (TOURNIQUET CUFF) ×4 IMPLANT
CUFF TOURNIQUET SINGLE 44IN (TOURNIQUET CUFF) IMPLANT
DRAPE OEC MINIVIEW 54X84 (DRAPES) ×4 IMPLANT
DRAPE U-SHAPE 47X51 STRL (DRAPES) ×4 IMPLANT
DRSG ADAPTIC 3X8 NADH LF (GAUZE/BANDAGES/DRESSINGS) ×4 IMPLANT
DRSG MEPILEX BORDER 4X4 (GAUZE/BANDAGES/DRESSINGS) ×4 IMPLANT
DRSG MEPITEL 3X4 ME34 (GAUZE/BANDAGES/DRESSINGS) ×4 IMPLANT
DRSG PAD ABDOMINAL 8X10 ST (GAUZE/BANDAGES/DRESSINGS) ×4 IMPLANT
ELECT REM PT RETURN 9FT ADLT (ELECTROSURGICAL) ×4
ELECTRODE REM PT RTRN 9FT ADLT (ELECTROSURGICAL) ×2 IMPLANT
GAUZE SPONGE 4X4 12PLY STRL (GAUZE/BANDAGES/DRESSINGS) IMPLANT
GLOVE BIO SURGEON STRL SZ7 (GLOVE) ×8 IMPLANT
GLOVE BIO SURGEON STRL SZ8 (GLOVE) ×4 IMPLANT
GLOVE BIOGEL PI IND STRL 7.5 (GLOVE) ×2 IMPLANT
GLOVE BIOGEL PI IND STRL 8 (GLOVE) ×2 IMPLANT
GLOVE BIOGEL PI INDICATOR 7.5 (GLOVE) ×2
GLOVE BIOGEL PI INDICATOR 8 (GLOVE) ×2
GLOVE SURG SS PI 7.0 STRL IVOR (GLOVE) ×8 IMPLANT
GOWN STRL REUS W/ TWL LRG LVL3 (GOWN DISPOSABLE) ×4 IMPLANT
GOWN STRL REUS W/ TWL XL LVL3 (GOWN DISPOSABLE) ×2 IMPLANT
GOWN STRL REUS W/TWL LRG LVL3 (GOWN DISPOSABLE) ×4
GOWN STRL REUS W/TWL XL LVL3 (GOWN DISPOSABLE) ×2
KIT BASIN OR (CUSTOM PROCEDURE TRAY) ×4 IMPLANT
KIT ROOM TURNOVER OR (KITS) ×4 IMPLANT
NEEDLE 22X1 1/2 (OR ONLY) (NEEDLE) IMPLANT
NS IRRIG 1000ML POUR BTL (IV SOLUTION) ×4 IMPLANT
PACK ORTHO EXTREMITY (CUSTOM PROCEDURE TRAY) ×4 IMPLANT
PAD ARMBOARD 7.5X6 YLW CONV (MISCELLANEOUS) ×8 IMPLANT
PAD CAST 4YDX4 CTTN HI CHSV (CAST SUPPLIES) ×2 IMPLANT
PADDING CAST COTTON 4X4 STRL (CAST SUPPLIES) ×2
PADDING CAST COTTON 6X4 STRL (CAST SUPPLIES) ×4 IMPLANT
PIN CAPS ORTHO GREEN .062 (PIN) ×4 IMPLANT
PLATE FIBULAR COMP LOCK 10H (Plate) ×4 IMPLANT
SCREW ACE CAN 4.0 44M (Screw) ×8 IMPLANT
SCREW LOCK CORT STAR 3.5X12 (Screw) ×12 IMPLANT
SCREW LOCK CORT STAR 3.5X14 (Screw) ×4 IMPLANT
SCREW LOW PROFILE 18MMX3.5MM (Screw) ×4 IMPLANT
SCREW NON LOCKING LP 3.5 14MM (Screw) ×4 IMPLANT
SCREW NON LOCKING LP 3.5 16MM (Screw) ×8 IMPLANT
SPLINT PLASTER CAST XFAST 5X30 (CAST SUPPLIES) ×2 IMPLANT
SPLINT PLASTER XFAST SET 5X30 (CAST SUPPLIES) ×2
SPONGE GAUZE 4X4 12PLY STER LF (GAUZE/BANDAGES/DRESSINGS) ×4 IMPLANT
SPONGE LAP 18X18 X RAY DECT (DISPOSABLE) ×4 IMPLANT
STAPLER VISISTAT 35W (STAPLE) IMPLANT
SUCTION FRAZIER TIP 10 FR DISP (SUCTIONS) ×4 IMPLANT
SUT ETHILON 3 0 PS 1 (SUTURE) ×4 IMPLANT
SUT MNCRL AB 3-0 PS2 18 (SUTURE) ×4 IMPLANT
SUT PROLENE 3 0 PS 2 (SUTURE) IMPLANT
SUT VIC AB 2-0 CT1 27 (SUTURE)
SUT VIC AB 2-0 CT1 TAPERPNT 27 (SUTURE) IMPLANT
SUT VIC AB 3-0 PS2 18 (SUTURE)
SUT VIC AB 3-0 PS2 18XBRD (SUTURE) IMPLANT
SYR CONTROL 10ML LL (SYRINGE) IMPLANT
TOWEL OR 17X24 6PK STRL BLUE (TOWEL DISPOSABLE) ×4 IMPLANT
TOWEL OR 17X26 10 PK STRL BLUE (TOWEL DISPOSABLE) ×4 IMPLANT
TUBE CONNECTING 12'X1/4 (SUCTIONS) ×1
TUBE CONNECTING 12X1/4 (SUCTIONS) ×3 IMPLANT
WATER STERILE IRR 1000ML POUR (IV SOLUTION) ×4 IMPLANT
WIRE K 1.6MM 144256 (MISCELLANEOUS) ×8 IMPLANT

## 2014-02-12 NOTE — Progress Notes (Signed)
Patient ID: [name] [name], male   DOB: 1950/10/05, 62 y.o.   MRN: [id number] I called his wife, Collie Siad, and updated her on his status. She reports she has stage 4 lung cancer and cannot visit. Georganna Skeans, MD, MPH, FACS Trauma: [phone] General Surgery: [phone]

## 2014-02-12 NOTE — Anesthesia Postprocedure Evaluation (Signed)
  Anesthesia Post-op Note  Patient: [name] [name]  Procedure(s) Performed: Procedure(s): OPEN REDUCTION INTERNAL FIXATION (ORIF) LEFT ANKLE FRACTURE  (Left) Closed Reduction  PERCUTANEOUS PINNING left great toe (Left)  Patient Location: PACU  Anesthesia Type:General  Level of Consciousness: awake  Airway and Oxygen Therapy: Patient Spontanous Breathing  Post-op Pain: mild  Post-op Assessment: Post-op Vital signs reviewed  Post-op Vital Signs: Reviewed  Last Vitals:  Filed Vitals:   02/12/14 1630  BP: 90/47  Pulse: 60  Temp: 37 C  Resp: 20    Complications: No apparent anesthesia complications

## 2014-02-12 NOTE — Transfer of Care (Signed)
Immediate Anesthesia Transfer of Care Note  Patient: [name] [name]  Procedure(s) Performed: Procedure(s): OPEN REDUCTION INTERNAL FIXATION (ORIF) LEFT ANKLE FRACTURE  (Left) Closed Reduction  PERCUTANEOUS PINNING left great toe (Left)  Patient Location: PACU and NICU  Anesthesia Type:General  Level of Consciousness: obtunded  Airway & Oxygen Therapy: Patient remains intubated per anesthesia plan and Patient placed on Ventilator (see vital sign flow sheet for setting)  Post-op Assessment: Report given to PACU RN and Post -op Vital signs reviewed and stable  Post vital signs: Reviewed and stable  Complications: No apparent anesthesia complications

## 2014-02-12 NOTE — Brief Op Note (Signed)
02/09/2014 - 02/12/2014  3:41 PM  PATIENT:  [name] [name]  63 y.o. male  PRE-OPERATIVE DIAGNOSIS: 1.  Left ankle trimal fracture and syndesmosis disruption  POST-OPERATIVE DIAGNOSIS: 1.  Left ankle trimalleolar fracture with stable syndesmosis      2.  Left hallux distal phalanx fracture  Procedure(s): 1.  ORIF left ankle trimalleolar fracture without fixation of the posterior lip   2.  Closed reduction and percutaneous pinning of the left hallux fracture   3.  Stress exam of left ankle under fluoro   4.  AP, mortise and lateral xrays of left ankle   5.  AP and lateral xrays of left hallux  SURGEON:  Wylene Simmer, MD  ASSISTANT: Lorin Mercy, PA-C  ANESTHESIA:   General  EBL:  minimal   TOURNIQUET:  62 min at 462 mm Hg  COMPLICATIONS:  None apparent  DISPOSITION:  Extubated, awake and stable to recovery.  DICTATION ID:  [id number]
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
## 2014-02-12 NOTE — Anesthesia Preprocedure Evaluation (Signed)
Anesthesia Evaluation    Airway        Dental   Pulmonary          Cardiovascular hypertension,     Neuro/Psych    GI/Hepatic   Endo/Other    Renal/GU      Musculoskeletal   Abdominal   Peds  Hematology   Anesthesia Other Findings   Reproductive/Obstetrics                             Anesthesia Physical Anesthesia Plan  ASA: III  Anesthesia Plan:    Post-op Pain Management:    Induction:   Airway Management Planned:   Additional Equipment:   Intra-op Plan:   Post-operative Plan:   Informed Consent:   Plan Discussed with:   Anesthesia Plan Comments:         Anesthesia Quick Evaluation  

## 2014-02-12 NOTE — Progress Notes (Addendum)
Patient ID: [name] [name], male   DOB: Aug 16, 1950, 63 y.o.   MRN: [id number] Follow up - Trauma Critical Care  Patient Details:    [name] [name] is an 63 y.o. male.  Lines/tubes : Airway 7.5 mm (Active)  Secured at (cm) 24 cm 02/12/2014  3:28 AM  Measured From Lips 02/12/2014  3:28 AM  Secured Location Right 02/12/2014  3:28 AM  Secured By [employer] 02/12/2014  3:28 AM  Tube Holder Repositioned Yes 02/12/2014  3:28 AM  Cuff Pressure (cm H2O) 26 cm H2O 02/12/2014  3:28 AM  Site Condition Dry 02/12/2014  3:28 AM     Chest Tube Left Pleural (Active)  Suction To water seal 02/11/2014  8:00 PM  Chest Tube Air Leak None 02/11/2014  8:00 PM  Drainage Description Bright red 02/11/2014  8:00 PM  Dressing Status Dry;Intact;Old drainage 02/11/2014  8:00 PM  Site Assessment Other (Comment) 02/10/2014  8:00 PM  Surrounding Skin Unable to view 02/11/2014  8:00 PM  Output (mL) 40 mL 02/12/2014  6:00 AM     NG/OG Tube Orogastric 16 Fr. Right mouth (Active)  Placement Verification Auscultation 02/11/2014  8:00 PM  Site Assessment Clean;Dry;Intact 02/11/2014  8:00 PM  Status Suction-low intermittent 02/11/2014  8:00 PM  Gastric Residual 0 mL 02/11/2014  8:00 AM     Urethral Catheter Varney Biles, RN Temperature probe 16 Fr. (Active)  Indication for Insertion or Continuance of Catheter Unstable critical patients (first 24-48 hours) 02/11/2014  7:58 PM  Site Assessment Clean;Intact;Dry 02/11/2014  8:00 AM  Catheter Maintenance Bag below level of bladder;Catheter secured;Drainage bag/tubing not touching floor;Insertion date on drainage bag;No dependent loops;Seal intact 02/11/2014  8:00 PM  Collection Container Standard drainage bag 02/11/2014  8:00 AM  Securement Method Leg strap 02/11/2014  8:00 AM  Urinary Catheter Interventions Unclamped 02/11/2014  8:00 AM  Output (mL) 700 mL 02/12/2014  7:46 AM    Microbiology/Sepsis markers: Results for orders placed during the hospital  encounter of 02/09/14  MRSA PCR SCREENING     Status: None   Collection Time    02/09/14  6:02 PM      Result Value Ref Range Status   MRSA by PCR NEGATIVE  NEGATIVE Final   Comment:            The GeneXpert MRSA Assay (FDA     approved for NASAL specimens     only), is one component of a     comprehensive MRSA colonization     surveillance program. It is not     intended to diagnose MRSA     infection nor to guide or     monitor treatment for     MRSA infections.    Anti-infectives:  Anti-infectives   Start     Dose/Rate Route Frequency Ordered Stop   02/12/14 0600  ceFAZolin (ANCEF) IVPB 2 g/50 mL premix     2 g 100 mL/hr over 30 Minutes Intravenous On call to O.R. 02/11/14 1429 02/12/14 0554      Best Practice/Protocols:  VTE Prophylaxis: Mechanical Continous Sedation  Consults: Treatment Team:  Wylene Simmer, MD Ashok Pall, MD    Studies:CXR no PTX, increased edema  Subjective:    Overnight Issues:   Objective:  Vital signs for last 24 hours: Temp:  [99.9 F (37.7 C)-102.2 F (39 C)] 99.9 F (37.7 C) (10/13 0700) Pulse Rate:  [63-91] 63 (10/13 0700) Resp:  [16-24] 20 (10/13 0700) BP: (50-145)/(39-107) 116/55 mmHg (10/13 0700) SpO2:  [  84 %-98 %] 96 % (10/13 0700) Arterial Line BP: (121-163)/(44-61) 127/56 mmHg (10/13 0700) FiO2 (%):  [40 %] 40 % (10/13 0328) Weight:  [224 lb 13.9 oz (102 kg)] 224 lb 13.9 oz (102 kg) (10/13 0500)  Hemodynamic parameters for last 24 hours:    Intake/Output from previous day: 10/12 0701 - 10/13 0700 In: 4098.2 [I.V.:3598.2; IV Piggyback:500] Out: 840 [Urine:800; Chest Tube:40]  Intake/Output this shift: Total I/O In: -  Out: 700 [Urine:700]  Vent settings for last 24 hours: Vent Mode:  [-] PRVC FiO2 (%):  [40 %] 40 % Set Rate:  [20 bmp] 20 bmp Vt Set:  [550 mL] 550 mL PEEP:  [5 cmH20] 5 cmH20 Plateau Pressure:  [20 cmH20-26 cmH20] 25 cmH20  Physical Exam:  General: on vent Neuro: awake on vent and F/C  well HEENT/Neck: ETT and collar Resp: few rhonchi B CVS: RRR GI: soft, NT, ND Extremities: LLE splint, toes contused  Results for orders placed during the hospital encounter of 02/09/14 (from the past 24 hour(s))  CBC     Status: Abnormal   Collection Time    02/11/14  9:30 AM      Result Value Ref Range   WBC 5.8  4.0 - 10.5 K/uL   RBC 3.05 (*) 4.22 - 5.81 MIL/uL   Hemoglobin 9.6 (*) 13.0 - 17.0 g/dL   HCT 30.0 (*) 39.0 - 52.0 %   MCV 98.4  78.0 - 100.0 fL   MCH 31.5  26.0 - 34.0 pg   MCHC 32.0  30.0 - 36.0 g/dL   RDW 17.0 (*) 11.5 - 15.5 %   Platelets 105 (*) 150 - 400 K/uL  TROPONIN I     Status: None   Collection Time    02/11/14  9:30 AM      Result Value Ref Range   Troponin I <0.30  <0.30 ng/mL  GLUCOSE, CAPILLARY     Status: Abnormal   Collection Time    02/11/14 11:38 AM      Result Value Ref Range   Glucose-Capillary 118 (*) 70 - 99 mg/dL   Comment 1 Notify RN     Comment 2 Documented in Chart    POCT I-STAT 3, ART BLOOD GAS (G3+)     Status: Abnormal   Collection Time    02/11/14  1:17 PM      Result Value Ref Range   pH, Arterial 7.322 (*) 7.350 - 7.450   pCO2 arterial 43.3  35.0 - 45.0 mmHg   pO2, Arterial 71.0 (*) 80.0 - 100.0 mmHg   Bicarbonate 22.1  20.0 - 24.0 mEq/L   TCO2 23  0 - 100 mmol/L   O2 Saturation 91.0     Acid-base deficit 3.0 (*) 0.0 - 2.0 mmol/L   Patient temperature 100.7 F     Collection site RADIAL, ALLEN'S TEST ACCEPTABLE     Drawn by Operator     Sample type ARTERIAL    GLUCOSE, CAPILLARY     Status: Abnormal   Collection Time    02/11/14  4:10 PM      Result Value Ref Range   Glucose-Capillary 134 (*) 70 - 99 mg/dL  GLUCOSE, CAPILLARY     Status: Abnormal   Collection Time    02/12/14 12:05 AM      Result Value Ref Range   Glucose-Capillary 125 (*) 70 - 99 mg/dL  GLUCOSE, CAPILLARY     Status: Abnormal   Collection Time    02/12/14  3:51 AM      Result Value Ref Range   Glucose-Capillary 105 (*) 70 - 99 mg/dL  BLOOD  GAS, ARTERIAL     Status: Abnormal   Collection Time    02/12/14  5:00 AM      Result Value Ref Range   FIO2 40.00     Delivery systems VENTILATOR     Mode PRESSURE REGULATED VOLUME CONTROL     VT 550     Rate 20     Peep/cpap 5.0     pH, Arterial 7.331 (*) 7.350 - 7.450   pCO2 arterial 41.9  35.0 - 45.0 mmHg   pO2, Arterial 63.4 (*) 80.0 - 100.0 mmHg   Bicarbonate 21.6  20.0 - 24.0 mEq/L   TCO2 22.9  0 - 100 mmol/L   Acid-base deficit 3.4 (*) 0.0 - 2.0 mmol/L   O2 Saturation 91.2     Patient temperature 98.6     Collection site A-LINE     Drawn by [phone]     Sample type ARTERIAL DRAW    CBC     Status: Abnormal   Collection Time    02/12/14  5:40 AM      Result Value Ref Range   WBC 4.1  4.0 - 10.5 K/uL   RBC 2.67 (*) 4.22 - 5.81 MIL/uL   Hemoglobin 8.4 (*) 13.0 - 17.0 g/dL   HCT 26.2 (*) 39.0 - 52.0 %   MCV 98.1  78.0 - 100.0 fL   MCH 31.5  26.0 - 34.0 pg   MCHC 32.1  30.0 - 36.0 g/dL   RDW 16.7 (*) 11.5 - 15.5 %   Platelets 104 (*) 150 - 400 K/uL  BASIC METABOLIC PANEL     Status: Abnormal   Collection Time    02/12/14  5:40 AM      Result Value Ref Range   Sodium 151 (*) 137 - 147 mEq/L   Potassium 4.0  3.7 - 5.3 mEq/L   Chloride 118 (*) 96 - 112 mEq/L   CO2 22  19 - 32 mEq/L   Glucose, Bld 124 (*) 70 - 99 mg/dL   BUN 20  6 - 23 mg/dL   Creatinine, Ser 0.86  0.50 - 1.35 mg/dL   Calcium 7.3 (*) 8.4 - 10.5 mg/dL   GFR calc non Af Amer 90 (*) >90 mL/min   GFR calc Af Amer >90  >90 mL/min   Anion gap 11  5 - 15    Assessment & Plan: Present on Admission:  . Blunt chest trauma . Fracture of lumbar spine . Multiple rib fractures involving four or more ribs . Pneumothorax on right . Traumatic mesenteric hematoma   LOS: 3 days   Additional comments:I reviewed the patient's new clinical lab test results. and CXR [place] Ventilator dependent respiratory failure - support this AM, resume weaning attempts after a OR today, scheduled BDs B rib FXs, R PTX/B pulm  contusions - No PTX on CXR, R CT on H2O seal, leave in as going to OR, remove tomorrow if output remains low PEA arrest at scene - uncertain etiology, troponin and EKG unremarkable, possibly due to PTX L trimal ankle FX - ORIF by Dr. Doran Durand today L1 endplate FX - TLSO per Dr. Christella Noa  FEN - U/O remains marginal but CRT ok, Lasix X 1 and change to D5 0.45NS due to hypernatremia L forehead lac ETOH abuse - CIWA, Precedex ABL anemia - down a bit but 6L positive -  see above, CBC after OR VTE - PAS R for now, consider Lovenox when Hb stable Dispo - ICU Critical Care Total Time*: 40 Minutes  Georganna Skeans, MD, MPH, FACS Trauma: [phone] General Surgery: [phone]  02/12/2014  *Care during the described time interval was provided by me. I have reviewed this patient's available data, including medical history, events of note, physical examination and test results as part of my evaluation.

## 2014-02-12 NOTE — Progress Notes (Addendum)
Subjective: Pt still intubated and sedated.  Cleared for OR today by Dr. Grandville Silos.   Objective: Vital signs in last 24 hours: Temp:  [99.9 F (37.7 C)-102.2 F (39 C)] 99.9 F (37.7 C) 03-13-2023 1100) Pulse Rate:  [62-91] 65 03-13-23 1100) Resp:  [5-23] 20 03/13/2023 1100) BP: (50-145)/(39-107) 110/50 mmHg 03-13-2023 1100) SpO2:  [84 %-98 %] 96 % 03-13-23 1100) Arterial Line BP: (111-163)/(44-64) 111/53 mmHg 2023/03/13 1100) FiO2 (%):  [40 %] 40 % 03/13/23 0821) Weight:  [102 kg (224 lb 13.9 oz)] 102 kg (224 lb 13.9 oz) 03/13/23 0500)  Intake/Output from previous day: 10/12 0701 2023/03/13 0700 In: 4098.2 [I.V.:3598.2; IV Piggyback:500] Out: 840 [Urine:800; Chest Tube:40] Intake/Output this shift: Total I/O In: -  Out: 825 [Urine:825]   Recent Labs  02/09/14 1359 02/10/14 0230 02/11/14 0235 02/11/14 0930 03-12-14 0540  HGB 16.0 11.5* 9.4* 9.6* 8.4*    Recent Labs  02/11/14 0930 03-12-2014 0540  WBC 5.8 4.1  RBC 3.05* 2.67*  HCT 30.0* 26.2*  PLT 105* 104*    Recent Labs  02/11/14 0235 03/12/14 0540  NA 145 151*  K 4.3 4.0  CL 115* 118*  CO2 20 22  BUN 28* 20  CREATININE 0.89 0.86  GLUCOSE 133* 124*  CALCIUM 7.0* 7.3*    Recent Labs  02/10/14 0230  INR 1.21    PE:  splint intact.  brisk cap refill at toes.    Assessment/Plan: L ankle trimal fracture - to OR for ORIF.  The risks and benefits of the alternative treatment options have been discussed in detail with the patient's [facility] - brother Kieon Lawhorn.  The patient's brother wishes to proceed with surgery and specifically understands risks of bleeding, infection, nerve damage, blood clots, need for additional surgery, amputation and death.    Nare Gaspari 2014/03/12, 11:12 AM

## 2014-02-13 ENCOUNTER — Encounter (HOSPITAL_COMMUNITY): Payer: Self-pay | Admitting: Orthopedic Surgery

## 2014-02-13 ENCOUNTER — Inpatient Hospital Stay (HOSPITAL_COMMUNITY): Payer: No Typology Code available for payment source

## 2014-02-13 LAB — CBC
HCT: 25.3 % — ABNORMAL LOW (ref 39.0–52.0)
Hemoglobin: 7.9 g/dL — ABNORMAL LOW (ref 13.0–17.0)
MCH: 31.1 pg (ref 26.0–34.0)
MCHC: 31.2 g/dL (ref 30.0–36.0)
MCV: 99.6 fL (ref 78.0–100.0)
PLATELETS: 124 10*3/uL — AB (ref 150–400)
RBC: 2.54 MIL/uL — AB (ref 4.22–5.81)
RDW: 16.2 % — ABNORMAL HIGH (ref 11.5–15.5)
WBC: 4.6 10*3/uL (ref 4.0–10.5)

## 2014-02-13 LAB — GLUCOSE, CAPILLARY
GLUCOSE-CAPILLARY: 128 mg/dL — AB (ref 70–99)
GLUCOSE-CAPILLARY: 120 mg/dL — AB (ref 70–99)
Glucose-Capillary: 141 mg/dL — ABNORMAL HIGH (ref 70–99)
Glucose-Capillary: 139 mg/dL — ABNORMAL HIGH (ref 70–99)
Glucose-Capillary: 111 mg/dL — ABNORMAL HIGH (ref 70–99)

## 2014-02-13 LAB — BASIC METABOLIC PANEL
ANION GAP: 11 (ref 5–15)
BUN: 20 mg/dL (ref 6–23)
CALCIUM: 7.3 mg/dL — AB (ref 8.4–10.5)
CHLORIDE: 115 meq/L — AB (ref 96–112)
CO2: 22 mEq/L (ref 19–32)
Creatinine, Ser: 0.9 mg/dL (ref 0.50–1.35)
GFR calc non Af Amer: 88 mL/min — ABNORMAL LOW (ref >90)
Glucose, Bld: 152 mg/dL — ABNORMAL HIGH (ref 70–99)
Potassium: 3.4 mEq/L — ABNORMAL LOW (ref 3.7–5.3)
SODIUM: 148 meq/L — AB (ref 137–147)

## 2014-02-13 MED ORDER — VANCOMYCIN HCL 10 G IV SOLR
2000.0000 mg | Freq: Once | INTRAVENOUS | Status: AC
  Administered 2014-02-14: 2000 mg via INTRAVENOUS
  Filled 2014-02-13: qty 2000

## 2014-02-13 MED ORDER — CLONAZEPAM 1 MG PO TABS
1.0000 mg | ORAL_TABLET | Freq: Two times a day (BID) | ORAL | Status: DC
  Administered 2014-02-13 (×2): 1 mg
  Filled 2014-02-13 (×2): qty 1

## 2014-02-13 MED ORDER — VANCOMYCIN HCL IN DEXTROSE 1-5 GM/200ML-% IV SOLN
1000.0000 mg | Freq: Two times a day (BID) | INTRAVENOUS | Status: DC
  Administered 2014-02-14 – 2014-02-17 (×6): 1000 mg via INTRAVENOUS
  Filled 2014-02-13 (×7): qty 200

## 2014-02-13 MED ORDER — CLONAZEPAM 0.1 MG/ML ORAL SUSPENSION: 1.0000 mg | Freq: Two times a day (BID) | ORAL | Status: DC

## 2014-02-13 MED ORDER — BISACODYL 10 MG RE SUPP
10.0000 mg | Freq: Every day | RECTAL | Status: DC | PRN
  Administered 2014-02-13 – 2014-02-17 (×3): 10 mg via RECTAL
  Filled 2014-02-13 (×3): qty 1

## 2014-02-13 MED ORDER — QUETIAPINE FUMARATE 50 MG PO TABS
50.0000 mg | ORAL_TABLET | Freq: Three times a day (TID) | ORAL | Status: DC
  Administered 2014-02-13 (×3): 50 mg
  Filled 2014-02-13 (×6): qty 1

## 2014-02-13 MED ORDER — LEVOFLOXACIN IN D5W 750 MG/150ML IV SOLN
750.0000 mg | INTRAVENOUS | Status: DC
  Administered 2014-02-14 – 2014-02-19 (×6): 750 mg via INTRAVENOUS
  Filled 2014-02-13 (×6): qty 150

## 2014-02-13 NOTE — Progress Notes (Signed)
Follow up - Trauma and Critical Care  Patient Details:    [name] [name] is an 63 y.o. male.  Lines/tubes : Airway 7.5 mm (Active)  Secured at (cm) 24 cm 02/13/2014  8:10 AM  Measured From Lips 02/13/2014  8:10 AM  Secured Location Right 02/13/2014  8:10 AM  Secured By [employer] 02/13/2014  8:10 AM  Tube Holder Repositioned Yes 02/13/2014  8:10 AM  Cuff Pressure (cm H2O) 24 cm H2O 02/13/2014  3:21 AM  Site Condition Dry 02/13/2014  8:10 AM     Chest Tube Left Pleural (Active)  Suction To water seal 02/13/2014  8:00 AM  Chest Tube Air Leak None 02/12/2014  8:00 PM  Patency Intervention Tip/tilt 02/12/2014  8:00 AM  Drainage Description Serosanguineous 02/13/2014  1:00 AM  Dressing Status Clean;Dry;Intact 02/12/2014  8:00 PM  Dressing Intervention Dressing changed 02/12/2014  8:00 PM  Site Assessment Clean;Dry;Intact 02/12/2014  8:00 PM  Surrounding Skin Unable to view 02/12/2014  8:00 PM  Output (mL) 130 mL 02/12/2014  4:00 PM     NG/OG Tube Orogastric 16 Fr. Right mouth (Active)  Placement Verification Auscultation 02/13/2014  8:00 AM  Site Assessment Clean;Dry;Intact 02/13/2014  8:00 AM  Status Infusing tube feed 02/13/2014  8:00 AM  Drainage Appearance Tan 02/13/2014  8:00 AM  Gastric Residual 0 mL 02/13/2014  8:00 AM  Intake (mL) 70 mL 02/13/2014  8:00 AM     Urethral Catheter [name], RN Temperature probe 16 Fr. (Active)  Indication for Insertion or Continuance of Catheter Unstable critical patients (first 24-48 hours) 02/13/2014  8:00 AM  Site Assessment Clean;Intact;Dry 02/13/2014  8:00 AM  Catheter Maintenance Bag below level of bladder;Catheter secured;Drainage bag/tubing not touching floor;Insertion date on drainage bag;No dependent loops;Seal intact 02/13/2014  8:00 AM  Collection Container Standard drainage bag 02/13/2014  8:00 AM  Securement Method Leg strap 02/13/2014  8:00 AM  Urinary Catheter Interventions Unclamped 02/13/2014  8:00 AM  Output  (mL) 125 mL 02/12/2014 10:00 PM    Microbiology/Sepsis markers: Results for orders placed during the hospital encounter of 02/09/14  MRSA PCR SCREENING     Status: None   Collection Time    02/09/14  6:02 PM      Result Value Ref Range Status   MRSA by PCR NEGATIVE  NEGATIVE Final   Comment:            The GeneXpert MRSA Assay (FDA     approved for NASAL specimens     only), is one component of a     comprehensive MRSA colonization     surveillance program. It is not     intended to diagnose MRSA     infection nor to guide or     monitor treatment for     MRSA infections.    Anti-infectives:  Anti-infectives   Start     Dose/Rate Route Frequency Ordered Stop   02/12/14 0600  ceFAZolin (ANCEF) IVPB 2 g/50 mL premix     2 g 100 mL/hr over 30 Minutes Intravenous On call to O.R. 02/11/14 1429 02/12/14 0554      Best Practice/Protocols:  VTE Prophylaxis: Mechanical GI Prophylaxis: Proton Pump Inhibitor Continous Sedation  Consults: Treatment Team:  Wylene Simmer, MD Ashok Pall, MD    Events:  Subjective:    Overnight Issues: Patient had surgery on his ankle yesterday.  Difficult to wean yesterday.  Responsive on the ventilator  Objective:  Vital signs for last 24 hours: Temp:  [  98.4 F (36.9 C)-100.4 F (38 C)] 100.4 F (38 C) (10/14 0900) Pulse Rate:  [52-70] 65 (10/14 0900) Resp:  [0-21] 20 (10/14 0900) BP: (74-129)/(28-65) 103/59 mmHg (10/14 0900) SpO2:  [91 %-100 %] 96 % (10/14 0900) Arterial Line BP: (98-130)/(43-66) 117/51 mmHg (10/14 0900) FiO2 (%):  [40 %] 40 % (10/14 0810) Weight:  [101.7 kg (224 lb 3.3 oz)] 101.7 kg (224 lb 3.3 oz) (10/14 0500)  Hemodynamic parameters for last 24 hours:    Intake/Output from previous day: 10/13 0701 - 10/14 0700 In: 1998.9 [I.V.:1288.9; NG/GT:460; IV Piggyback:250] Out: 2690 [Urine:2550; Blood:10; Chest Tube:130]  Intake/Output this shift: Total I/O In: 223.4 [I.V.:93.4; NG/GT:130] Out: -   Vent settings  for last 24 hours: Vent Mode:  [-] PRVC FiO2 (%):  [40 %] 40 % Set Rate:  [20 bmp] 20 bmp Vt Set:  [550 mL] 550 mL PEEP:  [5 cmH20] 5 cmH20 Plateau Pressure:  [22 [id number] cmH20] 22 cmH20  Physical Exam:  General: alert and on the ventilator Neuro: alert, nonfocal exam and agitated Resp: rhonchi bilaterally, LUL and RUL CVS: regular rate and rhythm, S1, S2 normal, no murmur, click, rub or gallop GI: distended, hypoactive BS and firm and a bit distended.  Seems to be tolerating tube feedings.  Extremities: edema 1+ and palpable pulses  Results for orders placed during the hospital encounter of 02/09/14 (from the past 24 hour(s))  GLUCOSE, CAPILLARY     Status: Abnormal   Collection Time    02/12/14 11:52 AM      Result Value Ref Range   Glucose-Capillary 118 (*) 70 - 99 mg/dL   Comment 1 Notify RN     Comment 2 Documented in Chart    GLUCOSE, CAPILLARY     Status: Abnormal   Collection Time    02/12/14  4:15 PM      Result Value Ref Range   Glucose-Capillary 110 (*) 70 - 99 mg/dL   Comment 1 Documented in Chart     Comment 2 Notify RN    CBC     Status: Abnormal   Collection Time    02/12/14  6:20 PM      Result Value Ref Range   WBC 5.4  4.0 - 10.5 K/uL   RBC 2.88 (*) 4.22 - 5.81 MIL/uL   Hemoglobin 9.1 (*) 13.0 - 17.0 g/dL   HCT 28.4 (*) 39.0 - 52.0 %   MCV 98.6  78.0 - 100.0 fL   MCH 31.6  26.0 - 34.0 pg   MCHC 32.0  30.0 - 36.0 g/dL   RDW 16.4 (*) 11.5 - 15.5 %   Platelets 125 (*) 150 - 400 K/uL  GLUCOSE, CAPILLARY     Status: Abnormal   Collection Time    02/12/14  8:14 PM      Result Value Ref Range   Glucose-Capillary 150 (*) 70 - 99 mg/dL  GLUCOSE, CAPILLARY     Status: Abnormal   Collection Time    02/12/14 11:27 PM      Result Value Ref Range   Glucose-Capillary 121 (*) 70 - 99 mg/dL   Comment 1 Documented in Chart     Comment 2 Notify RN    GLUCOSE, CAPILLARY     Status: Abnormal   Collection Time    02/13/14  3:23 AM      Result Value Ref Range    Glucose-Capillary 128 (*) 70 - 99 mg/dL   Comment 1 Documented in Chart  Comment 2 Notify RN    CBC     Status: Abnormal   Collection Time    02/13/14  5:00 AM      Result Value Ref Range   WBC 4.6  4.0 - 10.5 K/uL   RBC 2.54 (*) 4.22 - 5.81 MIL/uL   Hemoglobin 7.9 (*) 13.0 - 17.0 g/dL   HCT 25.3 (*) 39.0 - 52.0 %   MCV 99.6  78.0 - 100.0 fL   MCH 31.1  26.0 - 34.0 pg   MCHC 31.2  30.0 - 36.0 g/dL   RDW 16.2 (*) 11.5 - 15.5 %   Platelets 124 (*) 150 - 400 K/uL  BASIC METABOLIC PANEL     Status: Abnormal   Collection Time    02/13/14  5:00 AM      Result Value Ref Range   Sodium 148 (*) 137 - 147 mEq/L   Potassium 3.4 (*) 3.7 - 5.3 mEq/L   Chloride 115 (*) 96 - 112 mEq/L   CO2 22  19 - 32 mEq/L   Glucose, Bld 152 (*) 70 - 99 mg/dL   BUN 20  6 - 23 mg/dL   Creatinine, Ser 0.90  0.50 - 1.35 mg/dL   Calcium 7.3 (*) 8.4 - 10.5 mg/dL   GFR calc non Af Amer 88 (*) >90 mL/min   GFR calc Af Amer >90  >90 mL/min   Anion gap 11  5 - 15  GLUCOSE, CAPILLARY     Status: Abnormal   Collection Time    02/13/14  8:19 AM      Result Value Ref Range   Glucose-Capillary 141 (*) 70 - 99 mg/dL   Comment 1 Notify RN     Comment 2 Documented in Chart       Assessment/Plan:   NEURO  Altered Mental Status:  agitation and sedation   Plan: Need to wean from the ventilator.  PULM  Failure to Wean (due to pain, etiology unknown and possible anxiety.) Suggestion of left pleural effusionon CXR, but this was not read by the radiologist.   Plan: Try to wean from the ventilator.  May require some creative use of his sedation.  CARDIO  No issues   Plan: CPM  RENAL  Seems adequate although there may have been a recording error during the evening.   Plan: CPM  GI  No injury.  Getting tube feedings   Plan: Hold tube feedings if to be extubated today.  ID  No known infectious sources   Plan: CPM  HEME  Anemia acute blood loss anemia)   Plan: No blood for now.  Will follow  ENDO No known  issues   Plan: CPM  Global Issues  Patient to be weaned today.  Previously he has not done well.  Gets anxious.  Currently the only sedation he has is intravenous.    LOS: 4 days   Additional comments:I reviewed the patient's new clinical lab test results. cbc/bmet and I reviewed the patients new imaging test results. cxr  Critical Care Total Time*: 32 minutes of critical care evaluation and management   [place] Wachter, JAY 02/13/2014  *Care during the described time interval was provided by me and/or other providers on the critical care team.  I have reviewed this patient's available data, including medical history, events of note, physical examination and test results as part of my evaluation.

## 2014-02-13 NOTE — Progress Notes (Signed)
Patient ID: [name] [name], male   DOB: December 24, 1950, 63 y.o.   MRN: [id number] BP 100/53  Pulse 71  Temp(Src) 101.5 F (38.6 C) (Core (Comment))  Resp 20  Ht 5\' 7"  (1.702 m)  Wt 101.7 kg (224 lb 3.3 oz)  BMI 35.11 kg/m2  SpO2 94% sedAted following commands Moving right lower extremity, left leg in cast Not close to being off vent.  Ordering brace today

## 2014-02-13 NOTE — Progress Notes (Signed)
Subjective: 1 Day Post-Op Procedure(s) (LRB): OPEN REDUCTION INTERNAL FIXATION (ORIF) LEFT ANKLE FRACTURE  (Left) Closed Reduction  PERCUTANEOUS PINNING left great toe (Left) Patient intubated and sedated; unable to provide historical information. Does respond to stimuli and follows commands.  No new concerns from nursing. No family at the bedside. He failed his spontaneous breathing trial this morning. Portable knee radiographs from 10/13 demonstrate no acute fractures or dislocations.   Objective: Vital signs in last 24 hours: Temp:  [98.4 F (36.9 C)-101.1 F (38.4 C)] 101.1 F (38.4 C) (10/14 1200) Pulse Rate:  [56-73] 73 (10/14 1200) Resp:  [0-21] 18 (10/14 1200) BP: (74-129)/(28-65) 102/51 mmHg (10/14 1200) SpO2:  [91 %-100 %] 92 % (10/14 1200) Arterial Line BP: (98-135)/(43-66) 126/59 mmHg (10/14 1200) FiO2 (%):  [30 %-40 %] 30 % (10/14 1152) Weight:  [101.7 kg (224 lb 3.3 oz)] 101.7 kg (224 lb 3.3 oz) (10/14 0500)  Intake/Output from previous day: 10/13 0701 - 10/14 0700 In: 1998.9 [I.V.:1288.9; NG/GT:460; IV Piggyback:250] Out: 2690 [Urine:2550; Blood:10; Chest Tube:130] Intake/Output this shift: Total I/O In: 603.5 [I.V.:233.5; NG/GT:370] Out: 175 [Urine:175]   Recent Labs  02/11/14 0235 02/11/14 0930 02/12/14 0540 02/12/14 1820 02/13/14 0500  HGB 9.4* 9.6* 8.4* 9.1* 7.9*    Recent Labs  02/12/14 1820 02/13/14 0500  WBC 5.4 4.6  RBC 2.88* 2.54*  HCT 28.4* 25.3*  PLT 125* 124*    Recent Labs  02/12/14 0540 02/13/14 0500  NA 151* 148*  K 4.0 3.4*  CL 118* 115*  CO2 22 22  BUN 20 20  CREATININE 0.86 0.90  GLUCOSE 124* 152*  CALCIUM 7.3* 7.3*   No results found for this basename: LABPT, INR,  in the last 72 hours  Radiographs:  CLINICAL DATA: Left knee effusion. Swelling for 1 day. Status post  ORIF of the ankle.   EXAM:  PORTABLE LEFT KNEE - 1-2 VIEW   COMPARISON: None.   FINDINGS:  There is marked degenerative change involving the  medial  compartment. Moderate patellofemoral compartment degenerative  changes also noted. Small joint effusion is present. There is no  acute fracture.   IMPRESSION:  1. Degenerative changes.  2. Small effusion.   Electronically Signed   By: Shon Hale M.D.  On: 02/12/2014 16:53   Physical Exam: WD/WN caucasian male, heavily sedated and intubated. GCS 11T, opens eye spontaneously and follows commands. EOMI. Respirations per vent. CT in place to R side; serosanguinous drainage. Abdomen soft. LLE in hard splint and ACE wrap to L knee. Dressings C/D/I. NV intact with brisk capillary refill. Moves toes spontaneously and with command. Unable to assess distal sensation due to intubation. No calf swelling or palpable cords. L knee effusion present, but improved since yesterday.  Assessment/Plan: 1 Day Post-Op Procedure(s) (LRB): OPEN REDUCTION INTERNAL FIXATION (ORIF) LEFT ANKLE FRACTURE  (Left) Closed Reduction  PERCUTANEOUS PINNING left great toe (Left) -NWB LLT, in hard splint -DVT prophylaxis per trauma recs  -Pain control per trauma recs -Continue with recs from trauma service for management of other medical problems   [name] [name] 02/13/2014, 12:43 PM [phone]

## 2014-02-13 NOTE — Progress Notes (Signed)
Pt placed on CPAP/PS 16/5 with low Vt around 250-275, sats dropped to 87%. RN at bedside.  Will attempt to wean later with 20 PS per MD request.
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
## 2014-02-14 ENCOUNTER — Inpatient Hospital Stay (HOSPITAL_COMMUNITY): Payer: No Typology Code available for payment source

## 2014-02-14 LAB — GLUCOSE, CAPILLARY
GLUCOSE-CAPILLARY: 104 mg/dL — AB (ref 70–99)
GLUCOSE-CAPILLARY: 138 mg/dL — AB (ref 70–99)
Glucose-Capillary: 124 mg/dL — ABNORMAL HIGH (ref 70–99)
Glucose-Capillary: 95 mg/dL (ref 70–99)
Glucose-Capillary: 99 mg/dL (ref 70–99)
Glucose-Capillary: 82 mg/dL (ref 70–99)

## 2014-02-14 LAB — CBC WITH DIFFERENTIAL/PLATELET
BASOS PCT: 0 % (ref 0–1)
Basophils Absolute: 0 10*3/uL (ref 0.0–0.1)
Eosinophils Absolute: 0.1 10*3/uL (ref 0.0–0.7)
Eosinophils Relative: 3 % (ref 0–5)
HCT: 23.7 % — ABNORMAL LOW (ref 39.0–52.0)
Hemoglobin: 7.7 g/dL — ABNORMAL LOW (ref 13.0–17.0)
LYMPHS PCT: 17 % (ref 12–46)
Lymphs Abs: 0.7 10*3/uL (ref 0.7–4.0)
MCH: 31.7 pg (ref 26.0–34.0)
MCHC: 32.5 g/dL (ref 30.0–36.0)
MCV: 97.5 fL (ref 78.0–100.0)
Monocytes Absolute: 0.5 10*3/uL (ref 0.1–1.0)
Monocytes Relative: 13 % — ABNORMAL HIGH (ref 3–12)
NEUTROS PCT: 67 % (ref 43–77)
Neutro Abs: 2.8 10*3/uL (ref 1.7–7.7)
PLATELETS: 129 10*3/uL — AB (ref 150–400)
RBC: 2.43 MIL/uL — ABNORMAL LOW (ref 4.22–5.81)
RDW: 15.9 % — AB (ref 11.5–15.5)
WBC: 4.1 10*3/uL (ref 4.0–10.5)

## 2014-02-14 LAB — BASIC METABOLIC PANEL
ANION GAP: 9 (ref 5–15)
BUN: 21 mg/dL (ref 6–23)
CO2: 23 meq/L (ref 19–32)
CREATININE: 0.89 mg/dL (ref 0.50–1.35)
Calcium: 7.3 mg/dL — ABNORMAL LOW (ref 8.4–10.5)
Chloride: 112 mEq/L (ref 96–112)
GFR calc non Af Amer: 88 mL/min — ABNORMAL LOW (ref >90)
Glucose, Bld: 142 mg/dL — ABNORMAL HIGH (ref 70–99)
POTASSIUM: 3.3 meq/L — AB (ref 3.7–5.3)
SODIUM: 144 meq/L (ref 137–147)

## 2014-02-14 MED ORDER — ACETAMINOPHEN 160 MG/5ML PO SOLN
650.0000 mg | Freq: Four times a day (QID) | ORAL | Status: DC | PRN
Start: 2014-02-14 — End: 2014-02-23
  Administered 2014-02-14 – 2014-02-22 (×15): 650 mg
  Filled 2014-02-14 (×16): qty 20.3

## 2014-02-14 MED ORDER — FUROSEMIDE 10 MG/ML IJ SOLN
20.0000 mg | Freq: Once | INTRAMUSCULAR | Status: AC
  Administered 2014-02-14: 20 mg via INTRAVENOUS
  Filled 2014-02-14: qty 2

## 2014-02-14 MED ORDER — POTASSIUM CHLORIDE 10 MEQ/100ML IV SOLN: 10.0000 meq | INTRAVENOUS | Status: DC

## 2014-02-14 MED ORDER — QUETIAPINE FUMARATE 100 MG PO TABS
100.0000 mg | ORAL_TABLET | Freq: Three times a day (TID) | ORAL | Status: DC
  Administered 2014-02-14 (×3): 100 mg
  Filled 2014-02-14 (×7): qty 1

## 2014-02-14 MED ORDER — SODIUM CHLORIDE 0.9 % IJ SOLN
10.0000 mL | INTRAMUSCULAR | Status: DC | PRN
Start: 2014-02-14 — End: 2014-03-05

## 2014-02-14 MED ORDER — POTASSIUM CHLORIDE 10 MEQ/100ML IV SOLN
10.0000 meq | INTRAVENOUS | Status: AC
  Administered 2014-02-14 (×4): 10 meq via INTRAVENOUS
  Filled 2014-02-14 (×4): qty 100

## 2014-02-14 MED ORDER — CLONAZEPAM 1 MG PO TABS
2.0000 mg | ORAL_TABLET | Freq: Two times a day (BID) | ORAL | Status: DC
  Administered 2014-02-14 (×2): 2 mg
  Filled 2014-02-14 (×2): qty 2

## 2014-02-14 MED ORDER — SODIUM CHLORIDE 0.9 % IJ SOLN
10.0000 mL | Freq: Two times a day (BID) | INTRAMUSCULAR | Status: DC
  Administered 2014-02-14 – 2014-02-25 (×22): 10 mL
  Administered 2014-02-26: 20 mL
  Administered 2014-02-26 – 2014-03-04 (×11): 10 mL

## 2014-02-14 MED ORDER — POTASSIUM CHLORIDE 10 MEQ/50ML IV SOLN
INTRAVENOUS | Status: AC
  Filled 2014-02-14: qty 50

## 2014-02-14 MED ORDER — ENOXAPARIN SODIUM 40 MG/0.4ML ~~LOC~~ SOLN
40.0000 mg | SUBCUTANEOUS | Status: DC
  Administered 2014-02-14 – 2014-02-19 (×6): 40 mg via SUBCUTANEOUS
  Filled 2014-02-14 (×8): qty 0.4

## 2014-02-14 NOTE — Progress Notes (Signed)
Temp 103 core. MD notified. Order received.

## 2014-02-14 NOTE — Progress Notes (Signed)
UR completed. Await medical stability to further make d/c plans.   Sandi Mariscal, RN BSN [place] CCM Trauma/Neuro ICU Case Manager [phone]

## 2014-02-14 NOTE — Progress Notes (Signed)
Pt still with 400 cc residual in stomach. Continue to hold TF.

## 2014-02-14 NOTE — Progress Notes (Signed)
Peripherally Inserted Central Catheter/Midline Placement  The IV Nurse has discussed with the patient and/or persons authorized to consent for the patient, the purpose of this procedure and the potential benefits and risks involved with this procedure.  The benefits include less needle sticks, lab draws from the catheter and patient may be discharged home with the catheter.  Risks include, but not limited to, infection, bleeding, blood clot (thrombus formation), and puncture of an artery; nerve damage and irregular heat beat.  Alternatives to this procedure were also discussed.  Consent obtained from pts brother via telephone.  PICC/Midline Placement Documentation  PICC / Midline Double Lumen 00/71/21 PICC Right Basilic 40 cm 0 cm (Active)  Indication for Insertion or Continuance of Line Poor Vasculature-patient has had multiple peripheral attempts or PIVs lasting less than 24 hours 02/14/2014  2:47 PM  Exposed Catheter (cm) 0 cm 02/14/2014  2:47 PM  Site Assessment Clean;Dry;Intact 02/14/2014  2:47 PM  Lumen #1 Status Flushed;Saline locked;Blood return noted 02/14/2014  2:47 PM  Lumen #2 Status Flushed;Saline locked;Blood return noted 02/14/2014  2:47 PM  Dressing Type Transparent 02/14/2014  2:47 PM  Dressing Status Clean;Dry;Intact;Antimicrobial disc in place 02/14/2014  2:47 PM  Line Care Connections checked and tightened 02/14/2014  2:47 PM  Line Adjustment (NICU/IV Team Only) No 02/14/2014  2:47 PM  Dressing Intervention New dressing 02/14/2014  2:47 PM  Dressing Change Due 02/21/14 02/14/2014  2:47 PM       Rolena Infante 02/14/2014, 2:47 PM
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
## 2014-02-14 NOTE — Progress Notes (Signed)
Patient ID: [name] [name], male   DOB: May 23, 1950, 63 y.o.   MRN: [id number] BP 140/58  Pulse 106  Temp(Src) 102.6 F (39.2 C) (Core (Comment))  Resp 16  Ht 5\' 7"  (1.702 m)  Wt 105.9 kg (233 lb 7.5 oz)  BMI 36.56 kg/m2  SpO2 99% Alert and oriented x 4 Moving lower extremity(right) Brace waiting in room.  Continues to wean.

## 2014-02-14 NOTE — Progress Notes (Addendum)
Subjective: 2 Days Post-Op Procedure(s) (LRB): OPEN REDUCTION INTERNAL FIXATION (ORIF) LEFT ANKLE FRACTURE  (Left) Closed Reduction  PERCUTANEOUS PINNING left great toe (Left) Patient still intubated and sedated.  He's failed a couple of weaning trials so far.  Per RN at bedside, he will hopefully be extubated in the next day or so.  Objective: Vital signs in last 24 hours: Temp:  [100.6 F (38.1 C)-103.3 F (39.6 C)] 102.7 F (39.3 C) (10/15 1400) Pulse Rate:  [67-110] 107 (10/15 1400) Resp:  [13-22] 20 (10/15 1400) BP: (84-155)/(38-78) 129/52 mmHg (10/15 1400) SpO2:  [88 %-100 %] 99 % (10/15 1400) Arterial Line BP: (102-250)/(48-111) 133/105 mmHg (10/15 1400) FiO2 (%):  [30 %-40 %] 40 % (10/15 1130) Weight:  [105.9 kg (233 lb 7.5 oz)] 105.9 kg (233 lb 7.5 oz) (10/15 0400)  Intake/Output from previous day: 10/14 0701 - 10/15 0700 In: 4654.8 [I.V.:2964.8; NG/GT:1040; IV Piggyback:650] Out: 1285 [Urine:1015; Chest Tube:270] Intake/Output this shift: Total I/O In: 1722 [I.V.:812; NG/GT:410; IV Piggyback:500] Out: 1350 [Urine:1350]   Recent Labs  02/12/14 0540 02/12/14 1820 02/13/14 0500 02/14/14 0400  HGB 8.4* 9.1* 7.9* 7.7*    Recent Labs  02/13/14 0500 02/14/14 0400  WBC 4.6 4.1  RBC 2.54* 2.43*  HCT 25.3* 23.7*  PLT 124* 129*    Recent Labs  02/13/14 0500 02/14/14 0400  NA 148* 144  K 3.4* 3.3*  CL 115* 112  CO2 22 23  BUN 20 21  CREATININE 0.90 0.89  GLUCOSE 152* 142*  CALCIUM 7.3* 7.3*   No results found for this basename: LABPT, INR,  in the last 72 hours  PE:  splint intact.  Pins in hallux are in good position and stable.  Assessment/Plan: 2 Days Post-Op Procedure(s) (LRB): OPEN REDUCTION INTERNAL FIXATION (ORIF) LEFT ANKLE FRACTURE  (Left) Closed Reduction  PERCUTANEOUS PINNING left great toe (Left) NWB on L LE.  ROM of left knee is fine when PT starts.  Wylene Simmer 02/14/2014, 3:05 PM

## 2014-02-14 NOTE — Progress Notes (Signed)
Sputum sent

## 2014-02-15 ENCOUNTER — Inpatient Hospital Stay (HOSPITAL_COMMUNITY): Payer: No Typology Code available for payment source

## 2014-02-15 LAB — BASIC METABOLIC PANEL
Anion gap: 10 (ref 5–15)
BUN: 20 mg/dL (ref 6–23)
CHLORIDE: 106 meq/L (ref 96–112)
CO2: 27 mEq/L (ref 19–32)
Calcium: 7.6 mg/dL — ABNORMAL LOW (ref 8.4–10.5)
Creatinine, Ser: 1.06 mg/dL (ref 0.50–1.35)
GFR calc non Af Amer: 72 mL/min — ABNORMAL LOW (ref >90)
GFR, EST AFRICAN AMERICAN: 84 mL/min — AB (ref >90)
Glucose, Bld: 104 mg/dL — ABNORMAL HIGH (ref 70–99)
POTASSIUM: 3.3 meq/L — AB (ref 3.7–5.3)
SODIUM: 143 meq/L (ref 137–147)

## 2014-02-15 LAB — GLUCOSE, CAPILLARY
GLUCOSE-CAPILLARY: 104 mg/dL — AB (ref 70–99)
GLUCOSE-CAPILLARY: 113 mg/dL — AB (ref 70–99)
GLUCOSE-CAPILLARY: 132 mg/dL — AB (ref 70–99)
Glucose-Capillary: 101 mg/dL — ABNORMAL HIGH (ref 70–99)
Glucose-Capillary: 111 mg/dL — ABNORMAL HIGH (ref 70–99)
Glucose-Capillary: 159 mg/dL — ABNORMAL HIGH (ref 70–99)

## 2014-02-15 LAB — URINE CULTURE
Colony Count: NO GROWTH
Culture: NO GROWTH

## 2014-02-15 LAB — CBC
HCT: 24.1 % — ABNORMAL LOW (ref 39.0–52.0)
HEMOGLOBIN: 7.8 g/dL — AB (ref 13.0–17.0)
MCH: 32.4 pg (ref 26.0–34.0)
MCHC: 32.4 g/dL (ref 30.0–36.0)
MCV: 100 fL (ref 78.0–100.0)
Platelets: 149 10*3/uL — ABNORMAL LOW (ref 150–400)
RBC: 2.41 MIL/uL — ABNORMAL LOW (ref 4.22–5.81)
RDW: 15.6 % — AB (ref 11.5–15.5)
WBC: 5.4 10*3/uL (ref 4.0–10.5)

## 2014-02-15 MED ORDER — POTASSIUM CHLORIDE 20 MEQ/15ML (10%) PO LIQD
40.0000 meq | Freq: Once | ORAL | Status: AC
  Administered 2014-02-15: 40 meq
  Filled 2014-02-15: qty 30

## 2014-02-15 MED ORDER — FUROSEMIDE 10 MG/ML IJ SOLN
20.0000 mg | Freq: Once | INTRAMUSCULAR | Status: AC
  Administered 2014-02-15: 20 mg via INTRAVENOUS
  Filled 2014-02-15: qty 2

## 2014-02-15 MED ORDER — QUETIAPINE FUMARATE 200 MG PO TABS
200.0000 mg | ORAL_TABLET | Freq: Three times a day (TID) | ORAL | Status: DC
  Administered 2014-02-15 – 2014-02-22 (×24): 200 mg
  Filled 2014-02-15 (×27): qty 1

## 2014-02-15 MED ORDER — CLONAZEPAM 1 MG PO TABS
2.0000 mg | ORAL_TABLET | Freq: Three times a day (TID) | ORAL | Status: DC
  Administered 2014-02-15 – 2014-02-23 (×25): 2 mg
  Filled 2014-02-15 (×25): qty 2

## 2014-02-15 NOTE — Progress Notes (Signed)
Subjective: 3 Days Post-Op Procedure(s) (LRB): OPEN REDUCTION INTERNAL FIXATION (ORIF) LEFT ANKLE FRACTURE  (Left) Closed Reduction  PERCUTANEOUS PINNING left great toe (Left) Pt still intubated, not heavily sedated this morning. Responds with head nods. Nods head "yes" when asked if he can feel me touching toes. Nods head "no" when asked if in pain to the LLE.  He continues to try to grab his vent.  Nurse at bedside; she reports no new concerns other than fevers that started yesterday; pt placed on empiric ABX per trauma team.  Plan for SBT again this morning.  Objective: Vital signs in last 24 hours: Temp:  [100.4 F (38 C)-103.3 F (39.6 C)] 100.6 F (38.1 C) (10/16 0700) Pulse Rate:  [77-113] 77 (10/16 0700) Resp:  [14-24] 18 (10/16 0700) BP: (92-167)/(40-91) 97/50 mmHg (10/16 0700) SpO2:  [89 %-100 %] 99 % (10/16 0700) Arterial Line BP: (93-206)/(49-105) 128/57 mmHg (10/16 0400) FiO2 (%):  [40 %] 40 % (10/16 0352) Weight:  [100.1 kg (220 lb 10.9 oz)] 100.1 kg (220 lb 10.9 oz) (10/16 0500)  Intake/Output from previous day: 10/15 0701 - 10/16 0700 In: 3947 [I.V.:2487; NG/GT:610; IV Piggyback:850] Out: 3259 [Urine:2849; Chest Tube:410] Intake/Output this shift:     Recent Labs  02/12/14 1820 02/13/14 0500 02/14/14 0400  HGB 9.1* 7.9* 7.7*    Recent Labs  02/13/14 0500 02/14/14 0400  WBC 4.6 4.1  RBC 2.54* 2.43*  HCT 25.3* 23.7*  PLT 124* 129*    Recent Labs  02/13/14 0500 02/14/14 0400  NA 148* 144  K 3.4* 3.3*  CL 115* 112  CO2 22 23  BUN 20 21  CREATININE 0.90 0.89  GLUCOSE 152* 142*  CALCIUM 7.3* 7.3*   No results found for this basename: LABPT, INR,  in the last 72 hours  Physical Exam: Physical Exam: WD/WN caucasian male,  intubated. GCS 11T, opens eye spontaneously and follows commands. EOMI. Respirations per vent. CT in place to R side; serosanguinous drainage. Abdomen soft. LLE in hard splint and ACE wrap to L knee. Dressings C/D/I. NV intact  with brisk capillary refill. Moves toes spontaneously and with command bilaterally. 2 pins with caps to R hallux stable and in place. Distal sensation intact bilaterally. No calf swelling or palpable cords. L knee effusion present, but improved since yesterday.  Assessment/Plan: 3 Days Post-Op Procedure(s) (LRB): OPEN REDUCTION INTERNAL FIXATION (ORIF) LEFT ANKLE FRACTURE  (Left) Closed Reduction  PERCUTANEOUS PINNING left great toe (Left) -NWB LLE; Knee ROM okay with PT -Continue treatment for other medical problems per trauma team  Nasrin Lanzo HOWELLS 02/15/2014, 7:40 AM [phone]

## 2014-02-15 NOTE — Progress Notes (Addendum)
Patient ID: [name] [name], male   DOB: 1951/03/28, 63 y.o.   MRN: [id number] Follow up - Trauma Critical Care  Patient Details:    Willmer Fellers is an 63 y.o. male.  Lines/tubes : Airway 7.5 mm (Active)  Secured at (cm) 23 cm 02/15/2014  7:46 AM  Measured From Lips 02/15/2014  7:46 AM  Secured Location Center 02/15/2014  7:46 AM  Secured By [employer] 02/15/2014  7:46 AM  Tube Holder Repositioned Yes 02/15/2014  7:46 AM  Cuff Pressure (cm H2O) 26 cm H2O 02/15/2014  3:52 AM  Site Condition Dry 02/15/2014  7:46 AM     PICC / Midline Double Lumen 57/84/69 PICC Right Basilic 40 cm 0 cm (Active)  Indication for Insertion or Continuance of Line Poor Vasculature-patient has had multiple peripheral attempts or PIVs lasting less than 24 hours 02/14/2014  8:00 PM  Exposed Catheter (cm) 0 cm 02/14/2014  2:47 PM  Site Assessment Clean;Dry;Intact 02/14/2014  8:00 PM  Lumen #1 Status Flushed;Saline locked;Blood return noted 02/14/2014  8:00 PM  Lumen #2 Status Flushed;Saline locked;Blood return noted 02/14/2014  8:00 PM  Dressing Type Transparent 02/14/2014  8:00 PM  Dressing Status Clean;Dry;Intact;Antimicrobial disc in place 02/14/2014  8:00 PM  Line Care Connections checked and tightened 02/14/2014  8:00 PM  Line Adjustment (NICU/IV Team Only) No 02/14/2014  2:47 PM  Dressing Intervention New dressing 02/14/2014  2:47 PM  Dressing Change Due 02/21/14 02/14/2014  8:00 PM     Chest Tube Left Pleural (Active)  Suction To water seal 02/14/2014  8:00 PM  Chest Tube Air Leak None 02/14/2014  8:00 PM  Patency Intervention Tip/tilt 02/14/2014  8:00 AM  Drainage Description Serosanguineous 02/14/2014  8:00 PM  Dressing Status Clean;Dry;Intact 02/14/2014  8:00 PM  Dressing Intervention Dressing changed 02/12/2014  8:00 PM  Site Assessment Clean;Dry;Intact 02/14/2014  8:00 PM  Surrounding Skin Unable to view 02/14/2014  8:00 PM  Output (mL) 210 mL 02/15/2014  6:00 AM     NG/OG Tube  Orogastric 16 Fr. Right mouth (Active)  Placement Verification Auscultation 02/15/2014  4:00 AM  Site Assessment Clean;Dry;Intact 02/15/2014  4:00 AM  Status Infusing tube feed 02/15/2014  4:00 AM  Drainage Appearance None 02/15/2014  4:00 AM  Gastric Residual 160 mL 02/15/2014  4:00 AM  Intake (mL) 80 mL 02/14/2014 10:00 PM     Urethral Catheter Varney Biles, RN Temperature probe 16 Fr. (Active)  Indication for Insertion or Continuance of Catheter Unstable critical patients (first 24-48 hours);Unstable spinal/crush injuries;Peri-operative use for selective surgical procedure 02/14/2014  8:00 PM  Site Assessment Clean;Intact;Dry 02/14/2014  8:00 AM  Catheter Maintenance Bag below level of bladder;Catheter secured;Drainage bag/tubing not touching floor;Insertion date on drainage bag;No dependent loops;Seal intact 02/14/2014  8:00 PM  Collection Container Standard drainage bag 02/14/2014  8:00 AM  Securement Method Leg strap 02/14/2014  8:00 AM  Urinary Catheter Interventions Unclamped 02/14/2014  8:00 AM  Output (mL) 95 mL 02/15/2014  6:00 AM    Microbiology/Sepsis markers: Results for orders placed during the hospital encounter of 02/09/14  MRSA PCR SCREENING     Status: None   Collection Time    02/09/14  6:02 PM      Result Value Ref Range Status   MRSA by PCR NEGATIVE  NEGATIVE Final   Comment:            The GeneXpert MRSA Assay (FDA     approved for NASAL specimens     only), is one  component of a     comprehensive MRSA colonization     surveillance program. It is not     intended to diagnose MRSA     infection nor to guide or     monitor treatment for     MRSA infections.  CULTURE, RESPIRATORY (NON-EXPECTORATED)     Status: None   Collection Time    02/14/14 12:50 AM      Result Value Ref Range Status   Specimen Description TRACHEAL ASPIRATE   Final   Special Requests NONE   Final   Gram Stain     Final   Value: ABUNDANT WBC PRESENT, PREDOMINANTLY PMN     NO SQUAMOUS  EPITHELIAL CELLS SEEN     NO ORGANISMS SEEN     Performed at [facility]   Culture PENDING   Incomplete   Report Status PENDING   Incomplete  URINE CULTURE     Status: None   Collection Time    02/14/14  1:01 AM      Result Value Ref Range Status   Specimen Description URINE, CATHETERIZED   Final   Special Requests none   Final   Culture  Setup Time     Final   Value: 02/14/2014 08:34     Performed at [place]     Final   Value: NO GROWTH     Performed at [facility]   Culture     Final   Value: NO GROWTH     Performed at [facility]   Report Status 02/15/2014 FINAL   Final    Anti-infectives:  Anti-infectives   Start     Dose/Rate Route Frequency Ordered Stop   02/14/14 1200  vancomycin (VANCOCIN) IVPB 1000 mg/200 mL premix     1,000 mg 200 mL/hr over 60 Minutes Intravenous Every 12 hours 02/13/14 2332     02/14/14 0000  levofloxacin (LEVAQUIN) IVPB 750 mg     750 mg 100 mL/hr over 90 Minutes Intravenous Every 24 hours 02/13/14 2332     02/13/14 2345  vancomycin (VANCOCIN) 2,000 mg in sodium chloride 0.9 % 500 mL IVPB     2,000 mg 250 mL/hr over 120 Minutes Intravenous  Once 02/13/14 2332 02/14/14 0254   02/12/14 0600  ceFAZolin (ANCEF) IVPB 2 g/50 mL premix     2 g 100 mL/hr over 30 Minutes Intravenous On call to O.R. 02/11/14 1429 02/12/14 0554      Best Practice/Protocols:  VTE Prophylaxis: Lovenox (prophylaxtic dose) Continous Sedation  Consults: Treatment Team:  Wylene Simmer, MD Ashok Pall, MD    Studies:CXR - 1. Probable slight worsening of pulmonary vascular congestion.  2. Left pleural effusion. Multiple left rib fractures.  3. Right PICC line tip near expected SVC-RA junction.  4. Questionable catheter overlying the expected course of a left IJ  catheter. Correlate clinically.   Subjective:    Overnight Issues:  Weaned yesterday but needed high PS Objective:  Vital signs for last 24  hours: Temp:  [100.4 F (38 C)-103.3 F (39.6 C)] 100.6 F (38.1 C) (10/16 0700) Pulse Rate:  [77-113] 87 (10/16 0746) Resp:  [14-24] 16 (10/16 0746) BP: (92-167)/(40-91) 97/50 mmHg (10/16 0700) SpO2:  [89 %-100 %] 94 % (10/16 0746) Arterial Line BP: (93-206)/(49-105) 128/57 mmHg (10/16 0400) FiO2 (%):  [40 %] 40 % (10/16 0746) Weight:  [220 lb 10.9 oz (100.1 kg)] 220 lb 10.9 oz (100.1 kg) (10/16 0500)  Hemodynamic parameters for last 24 hours:    Intake/Output from previous day: 10/15 0701 - 10/16 0700 In: 3947 [I.V.:2487; NG/GT:610; IV Piggyback:850] Out: 3259 [Urine:2849; Chest Tube:410]  Intake/Output this shift:    Vent settings for last 24 hours: Vent Mode:  [-] CPAP;PSV FiO2 (%):  [40 %] 40 % Set Rate:  [20 bmp] 20 bmp Vt Set:  [550 mL] 550 mL PEEP:  [5 cmH20] 5 cmH20 Pressure Support:  [18 cmH20] 18 cmH20 Plateau Pressure:  [17 TXM46-80 cmH20] 20 cmH20  Physical Exam:  General: on vent Neuro: F/C to move BLE HEENT/Neck: ETT WNL  and collar Resp: clear to auscultation bilaterally CVS: RRR GI: soft, NT, some distention but good BS Extremities: splint LLE  Results for orders placed during the hospital encounter of 02/09/14 (from the past 24 hour(s))  GLUCOSE, CAPILLARY     Status: Abnormal   Collection Time    02/14/14  8:31 AM      Result Value Ref Range   Glucose-Capillary 138 (*) 70 - 99 mg/dL   Comment 1 Notify RN     Comment 2 Documented in Chart    GLUCOSE, CAPILLARY     Status: None   Collection Time    02/14/14 11:44 AM      Result Value Ref Range   Glucose-Capillary 95  70 - 99 mg/dL   Comment 1 Notify RN     Comment 2 Documented in Chart    GLUCOSE, CAPILLARY     Status: None   Collection Time    02/14/14  3:51 PM      Result Value Ref Range   Glucose-Capillary 99  70 - 99 mg/dL   Comment 1 Notify RN     Comment 2 Documented in Chart    GLUCOSE, CAPILLARY     Status: None   Collection Time    02/14/14  7:54 PM      Result Value Ref  Range   Glucose-Capillary 82  70 - 99 mg/dL  GLUCOSE, CAPILLARY     Status: Abnormal   Collection Time    02/15/14 12:00 AM      Result Value Ref Range   Glucose-Capillary 113 (*) 70 - 99 mg/dL  GLUCOSE, CAPILLARY     Status: Abnormal   Collection Time    02/15/14  4:14 AM      Result Value Ref Range   Glucose-Capillary 101 (*) 70 - 99 mg/dL    Assessment & Plan: Present on Admission:  . Blunt chest trauma . Fracture of lumbar spine . Multiple rib fractures involving four or more ribs . Pneumothorax on right . Traumatic mesenteric hematoma   LOS: 6 days   Additional comments:I reviewed the patient's new clinical lab test results. and CXR  [facility] Ventilator dependent respiratory failure -  scheduled BDs, continue trying to wean, increase Klonopin and Seroquel to help B rib FXs, R PTX/B pulm contusions - No PTX on CXR, R CT on H2O seal, leave in until output decreases - was 410/24h PEA arrest at scene - uncertain etiology, troponin and EKG unremarkable, possibly due to PTX L trimal ankle FX - ORIF by Dr. Doran Durand, NWB L1 endplate FX - TLSO per Dr. Christella Noa  ID - Vanc and Levaquin empiric, urine CX neg, resp CX P FEN - Lasix X 1 today L forehead lac ETOH abuse - CIWA, Precedex ABL anemia - check labs ow VTE - PAS R,  Lovenox Dispo - ICUCritical Care Total Time*: 35 Minutes  Georganna Skeans, MD, MPH, FACS Trauma: [phone] General Surgery: [phone]  02/15/2014  *Care during the described time interval was provided by me. I have reviewed this patient's available data, including medical history, events of note, physical examination and test results as part of my evaluation.

## 2014-02-15 NOTE — Progress Notes (Signed)
Temp 103. Discussed with trauma team. Tylenol and cool bath given.

## 2014-02-15 NOTE — Progress Notes (Signed)
TF continue to be paused due to residuals being 680 cc.

## 2014-02-15 NOTE — Progress Notes (Signed)
TF Restarted, no residuals at this time.

## 2014-02-15 NOTE — Progress Notes (Signed)
Pt cont with fever 103. More tylenol and ice packs given. To notify trauma services if/ when temp reaches 104.

## 2014-02-15 NOTE — Progress Notes (Addendum)
BPs trending 90s over 40s, MAP in 50s. HR in 90s, pt still able to follow commands. Fentanyl gtt decreased, 500 cc fluid bolus given per standing order. Discussed with Dr. Hulen Skains at [phone].

## 2014-02-16 ENCOUNTER — Inpatient Hospital Stay (HOSPITAL_COMMUNITY): Payer: No Typology Code available for payment source

## 2014-02-16 LAB — BASIC METABOLIC PANEL
ANION GAP: 10 (ref 5–15)
BUN: 22 mg/dL (ref 6–23)
CHLORIDE: 108 meq/L (ref 96–112)
CO2: 27 mEq/L (ref 19–32)
CREATININE: 1.07 mg/dL (ref 0.50–1.35)
Calcium: 8.1 mg/dL — ABNORMAL LOW (ref 8.4–10.5)
GFR calc non Af Amer: 71 mL/min — ABNORMAL LOW (ref >90)
GFR, EST AFRICAN AMERICAN: 83 mL/min — AB (ref >90)
Glucose, Bld: 111 mg/dL — ABNORMAL HIGH (ref 70–99)
Potassium: 3.7 mEq/L (ref 3.7–5.3)
Sodium: 145 mEq/L (ref 137–147)

## 2014-02-16 LAB — GLUCOSE, CAPILLARY
GLUCOSE-CAPILLARY: 148 mg/dL — AB (ref 70–99)
GLUCOSE-CAPILLARY: 182 mg/dL — AB (ref 70–99)
GLUCOSE-CAPILLARY: 153 mg/dL — AB (ref 70–99)
GLUCOSE-CAPILLARY: 107 mg/dL — AB (ref 70–99)
Glucose-Capillary: 113 mg/dL — ABNORMAL HIGH (ref 70–99)
Glucose-Capillary: 127 mg/dL — ABNORMAL HIGH (ref 70–99)

## 2014-02-16 LAB — CULTURE, RESPIRATORY W GRAM STAIN

## 2014-02-16 LAB — CBC
HCT: 23.4 % — ABNORMAL LOW (ref 39.0–52.0)
Hemoglobin: 7.6 g/dL — ABNORMAL LOW (ref 13.0–17.0)
MCH: 32.6 pg (ref 26.0–34.0)
MCHC: 32.5 g/dL (ref 30.0–36.0)
MCV: 100.4 fL — AB (ref 78.0–100.0)
PLATELETS: 161 10*3/uL (ref 150–400)
RBC: 2.33 MIL/uL — ABNORMAL LOW (ref 4.22–5.81)
RDW: 15.7 % — ABNORMAL HIGH (ref 11.5–15.5)
WBC: 3.8 10*3/uL — AB (ref 4.0–10.5)

## 2014-02-16 LAB — CULTURE, RESPIRATORY

## 2014-02-16 MED ORDER — INSULIN ASPART 100 UNIT/ML ~~LOC~~ SOLN
0.0000 [IU] | SUBCUTANEOUS | Status: DC
  Administered 2014-02-16 (×2): 2 [IU] via SUBCUTANEOUS
  Administered 2014-02-17 (×2): 1 [IU] via SUBCUTANEOUS
  Administered 2014-02-17: 2 [IU] via SUBCUTANEOUS
  Administered 2014-02-17 (×2): 1 [IU] via SUBCUTANEOUS
  Administered 2014-02-18: 2 [IU] via SUBCUTANEOUS
  Administered 2014-02-18 – 2014-02-19 (×6): 1 [IU] via SUBCUTANEOUS
  Administered 2014-02-20: 2 [IU] via SUBCUTANEOUS
  Administered 2014-02-20: 1 [IU] via SUBCUTANEOUS

## 2014-02-16 MED ORDER — METOCLOPRAMIDE HCL 5 MG/ML IJ SOLN
10.0000 mg | Freq: Four times a day (QID) | INTRAMUSCULAR | Status: DC
  Administered 2014-02-16 – 2014-03-05 (×57): 10 mg via INTRAVENOUS
  Filled 2014-02-16 (×74): qty 2

## 2014-02-16 NOTE — Progress Notes (Signed)
Tube feeds restarted. Residuals down to 50 cc at this time.

## 2014-02-16 NOTE — Progress Notes (Signed)
Patient ID: [name] [name], male   DOB: [birth year], 63 y.o.   MRN: [id number] Follow up - Trauma Critical Care  Patient Details:    [name] [name] is an 63 y.o. male.  Lines/tubes : Airway 7.5 mm (Active)  Secured at (cm) 23 cm 02/16/2014 11:25 AM  Measured From Lips 02/16/2014 11:25 AM  Secured Location Center 02/16/2014 11:25 AM  Secured By [employer] 02/16/2014 11:25 AM  Tube Holder Repositioned Yes 02/16/2014 11:25 AM  Cuff Pressure (cm H2O) 25 cm H2O 02/15/2014  8:06 PM  Site Condition Dry 02/16/2014 11:25 AM     PICC / Midline Double Lumen 60/73/71 PICC Right Basilic 40 cm 0 cm (Active)  Indication for Insertion or Continuance of Line Poor Vasculature-patient has had multiple peripheral attempts or PIVs lasting less than 24 hours 02/16/2014  8:00 AM  Exposed Catheter (cm) 0 cm 02/14/2014  2:47 PM  Site Assessment Clean;Dry;Intact 02/16/2014  8:00 AM  Lumen #1 Status Flushed;Saline locked;Blood return noted 02/16/2014  8:00 AM  Lumen #2 Status Flushed;Saline locked;Blood return noted 02/16/2014  8:00 AM  Dressing Type Transparent 02/16/2014  8:00 AM  Dressing Status Clean;Dry;Intact;Antimicrobial disc in place 02/16/2014  8:00 AM  Line Care Connections checked and tightened 02/16/2014  8:00 AM  Line Adjustment (NICU/IV Team Only) No 02/14/2014  2:47 PM  Dressing Intervention New dressing 02/14/2014  2:47 PM  Dressing Change Due 02/21/14 02/16/2014  8:00 AM     Chest Tube Left Pleural (Active)  Suction To water seal 02/16/2014  8:00 AM  Chest Tube Air Leak None 02/16/2014  8:00 AM  Patency Intervention Tip/tilt 02/16/2014  8:00 AM  Drainage Description Serous 02/16/2014  8:00 AM  Dressing Status Clean;Dry;Intact 02/16/2014  8:00 AM  Dressing Intervention Dressing changed 02/12/2014  8:00 PM  Site Assessment Clean;Dry;Intact 02/16/2014  8:00 AM  Surrounding Skin Unable to view 02/16/2014  8:00 AM  Output (mL) 70 mL 02/16/2014  6:00 AM     NG/OG Tube Orogastric  16 Fr. Right mouth (Active)  Placement Verification Auscultation 02/16/2014  8:00 AM  Site Assessment Clean;Dry;Intact 02/16/2014  8:00 AM  Status Infusing tube feed 02/16/2014  8:00 AM  Drainage Appearance Tan 02/16/2014  8:00 AM  Gastric Residual 30 mL 02/16/2014  8:00 AM  Intake (mL) 150 mL 02/15/2014 10:00 PM     Urethral Catheter [name], RN Temperature probe 16 Fr. (Active)  Indication for Insertion or Continuance of Catheter Unstable critical patients (first 24-48 hours);Aggressive IV diuresis;Unstable spinal/crush injuries;Peri-operative use for selective surgical procedure 02/16/2014  8:00 AM  Site Assessment Clean;Intact;Dry 02/16/2014  8:00 AM  Catheter Maintenance Bag below level of bladder;Catheter secured;Drainage bag/tubing not touching floor;Insertion date on drainage bag;No dependent loops;Seal intact 02/16/2014  8:00 AM  Collection Container Standard drainage bag 02/16/2014  8:00 AM  Securement Method Securing device (Describe) 02/16/2014  8:00 AM  Urinary Catheter Interventions Unclamped 02/16/2014  8:00 AM  Output (mL) 200 mL 02/16/2014  8:00 AM    Microbiology/Sepsis markers: Results for orders placed during the hospital encounter of 02/09/14  MRSA PCR SCREENING     Status: None   Collection Time    02/09/14  6:02 PM      Result Value Ref Range Status   MRSA by PCR NEGATIVE  NEGATIVE Final   Comment:            The GeneXpert MRSA Assay (FDA     approved for NASAL specimens     only), is one component of a  comprehensive MRSA colonization     surveillance program. It is not     intended to diagnose MRSA     infection nor to guide or     monitor treatment for     MRSA infections.  CULTURE, BLOOD (ROUTINE X 2)     Status: None   Collection Time    02/14/14 12:40 AM      Result Value Ref Range Status   Specimen Description BLOOD LEFT WRIST   Final   Special Requests BOTTLES DRAWN AEROBIC AND ANAEROBIC 10CC   Final   Culture  Setup Time     Final   Value:  02/14/2014 08:41     Performed at [facility]   Culture     Final   Value:        BLOOD CULTURE RECEIVED NO GROWTH TO DATE CULTURE WILL BE HELD FOR 5 DAYS BEFORE ISSUING A FINAL NEGATIVE REPORT     Performed at [facility]   Report Status PENDING   Incomplete  CULTURE, BLOOD (ROUTINE X 2)     Status: None   Collection Time    02/14/14 12:50 AM      Result Value Ref Range Status   Specimen Description BLOOD LEFT ARM   Final   Special Requests BOTTLES DRAWN AEROBIC ONLY 10CC   Final   Culture  Setup Time     Final   Value: 02/14/2014 08:42     Performed at [facility]   Culture     Final   Value:        BLOOD CULTURE RECEIVED NO GROWTH TO DATE CULTURE WILL BE HELD FOR 5 DAYS BEFORE ISSUING A FINAL NEGATIVE REPORT     Performed at [facility]   Report Status PENDING   Incomplete  CULTURE, RESPIRATORY (NON-EXPECTORATED)     Status: None   Collection Time    02/14/14 12:50 AM      Result Value Ref Range Status   Specimen Description TRACHEAL ASPIRATE   Final   Special Requests NONE   Final   Gram Stain     Final   Value: ABUNDANT WBC PRESENT, PREDOMINANTLY PMN     NO SQUAMOUS EPITHELIAL CELLS SEEN     NO ORGANISMS SEEN     Performed at [facility]   Culture     Final   Value: Non-Pathogenic Oropharyngeal-type Flora Isolated.     Performed at [facility]   Report Status 02/16/2014 FINAL   Final  URINE CULTURE     Status: None   Collection Time    02/14/14  1:01 AM      Result Value Ref Range Status   Specimen Description URINE, CATHETERIZED   Final   Special Requests none   Final   Culture  Setup Time     Final   Value: 02/14/2014 08:34     Performed at [place]     Final   Value: NO GROWTH     Performed at [facility]   Culture     Final   Value: NO GROWTH     Performed at [facility]   Report Status 02/15/2014 FINAL   Final    Anti-infectives:  Anti-infectives   Start      Dose/Rate Route Frequency Ordered Stop   02/14/14 1200  vancomycin (VANCOCIN) IVPB 1000 mg/200 mL premix     1,000 mg 200 mL/hr over  60 Minutes Intravenous Every 12 hours 02/13/14 2332     02/14/14 0000  levofloxacin (LEVAQUIN) IVPB 750 mg     750 mg 100 mL/hr over 90 Minutes Intravenous Every 24 hours 02/13/14 2332     02/13/14 2345  vancomycin (VANCOCIN) 2,000 mg in sodium chloride 0.9 % 500 mL IVPB     2,000 mg 250 mL/hr over 120 Minutes Intravenous  Once 02/13/14 2332 02/14/14 0254   02/12/14 0600  ceFAZolin (ANCEF) IVPB 2 g/50 mL premix     2 g 100 mL/hr over 30 Minutes Intravenous On call to O.R. 02/11/14 1429 02/12/14 0554      Best Practice/Protocols:  VTE Prophylaxis: Lovenox (prophylaxtic dose) Continous Sedation  Consults: Treatment Team:  Wylene Simmer, MD Ashok Pall, MD    Studies:    Events:  Subjective:    Overnight Issues:   Objective:  Vital signs for last 24 hours: Temp:  [98.4 F (36.9 C)-103.5 F (39.7 C)] 101.3 F (38.5 C) (10/17 0800) Pulse Rate:  [74-111] 94 (10/17 0800) Resp:  [13-25] 20 (10/17 0800) BP: (86-146)/(42-77) 90/42 mmHg (10/17 0800) SpO2:  [86 %-100 %] 94 % (10/17 1125) FiO2 (%):  [40 %] 40 % (10/17 1125) Weight:  [215 lb 13.3 oz (97.9 kg)] 215 lb 13.3 oz (97.9 kg) (10/17 0310)  Hemodynamic parameters for last 24 hours:    Intake/Output from previous day: 10/16 0701 - 10/17 0700 In: 3618 [I.V.:2055; NG/GT:513; IV Piggyback:550] Out: 1660 [Urine:4985; Chest Tube:270]  Intake/Output this shift: Total I/O In: 230 [I.V.:170; NG/GT:60] Out: 200 [Urine:200]  Vent settings for last 24 hours: Vent Mode:  [-] PSV;CPAP FiO2 (%):  [40 %] 40 % Set Rate:  [20 bmp] 20 bmp Vt Set:  [550 mL] 550 mL PEEP:  [5 cmH20] 5 cmH20 Pressure Support:  [8 cmH20] 8 cmH20 Plateau Pressure:  [19 cmH20-23 cmH20] 23 cmH20  Physical Exam:  General: on vent Neuro: arouses and F/C HEENT/Neck: ETT Resp: clear to auscultation  bilaterally CVS: RRR GI: soft, NT, +BS Extremities: mild edema  Results for orders placed during the hospital encounter of 02/09/14 (from the past 24 hour(s))  GLUCOSE, CAPILLARY     Status: Abnormal   Collection Time    02/15/14 12:07 PM      Result Value Ref Range   Glucose-Capillary 104 (*) 70 - 99 mg/dL   Comment 1 Notify RN     Comment 2 Documented in Chart    GLUCOSE, CAPILLARY     Status: Abnormal   Collection Time    02/15/14  3:31 PM      Result Value Ref Range   Glucose-Capillary 132 (*) 70 - 99 mg/dL  GLUCOSE, CAPILLARY     Status: Abnormal   Collection Time    02/15/14  8:50 PM      Result Value Ref Range   Glucose-Capillary 159 (*) 70 - 99 mg/dL  GLUCOSE, CAPILLARY     Status: Abnormal   Collection Time    02/15/14 11:43 PM      Result Value Ref Range   Glucose-Capillary 127 (*) 70 - 99 mg/dL   Comment 1 Documented in Chart     Comment 2 Notify RN    CBC     Status: Abnormal   Collection Time    02/16/14  1:45 AM      Result Value Ref Range   WBC 3.8 (*) 4.0 - 10.5 K/uL   RBC 2.33 (*) 4.22 - 5.81 MIL/uL   Hemoglobin 7.6 (*)  13.0 - 17.0 g/dL   HCT 23.4 (*) 39.0 - 52.0 %   MCV 100.4 (*) 78.0 - 100.0 fL   MCH 32.6  26.0 - 34.0 pg   MCHC 32.5  30.0 - 36.0 g/dL   RDW 15.7 (*) 11.5 - 15.5 %   Platelets 161  150 - 400 K/uL  BASIC METABOLIC PANEL     Status: Abnormal   Collection Time    02/16/14  1:45 AM      Result Value Ref Range   Sodium 145  137 - 147 mEq/L   Potassium 3.7  3.7 - 5.3 mEq/L   Chloride 108  96 - 112 mEq/L   CO2 27  19 - 32 mEq/L   Glucose, Bld 111 (*) 70 - 99 mg/dL   BUN 22  6 - 23 mg/dL   Creatinine, Ser 1.07  0.50 - 1.35 mg/dL   Calcium 8.1 (*) 8.4 - 10.5 mg/dL   GFR calc non Af Amer 71 (*) >90 mL/min   GFR calc Af Amer 83 (*) >90 mL/min   Anion gap 10  5 - 15  GLUCOSE, CAPILLARY     Status: Abnormal   Collection Time    02/16/14  3:32 AM      Result Value Ref Range   Glucose-Capillary 113 (*) 70 - 99 mg/dL   Comment 1  Documented in Chart     Comment 2 Notify RN    GLUCOSE, CAPILLARY     Status: Abnormal   Collection Time    02/16/14  8:21 AM      Result Value Ref Range   Glucose-Capillary 148 (*) 70 - 99 mg/dL    Assessment & Plan: Present on Admission:  . Blunt chest trauma . Fracture of lumbar spine . Multiple rib fractures involving four or more ribs . Pneumothorax on right . Traumatic mesenteric hematoma   LOS: 7 days   Additional comments:I reviewed the patient's new clinical lab test results. and cxr [facility] Ventilator dependent respiratory failure -  scheduled BDs, continue trying to wean, increased Klonopin and Seroquel 10/16. Doing a lot better today. B rib FXs, R PTX/B pulm contusions - No PTX on CXR, R CT on H2O seal, leave in until output decreases PEA arrest at scene - uncertain etiology, troponin and EKG unremarkable, possibly due to PTX L trimal ankle FX - ORIF by Dr. Doran Durand, NWB L1 endplate FX - TLSO per Dr. Christella Noa  ID - Vanc and Levaquin empiric, urine CX neg, resp CX nl flora so far. Check WBC in AM FEN - add reglan L forehead lac ETOH abuse - CIWA, Precedex ABL anemia - check labs ow VTE - PAS R,  Lovenox Dispo - ICU Critical Care Total Time*: 32 Minutes  Georganna Skeans, MD, MPH, FACS Trauma: [phone] General Surgery: [phone]  02/16/2014  *Care during the described time interval was provided by me. I have reviewed this patient's available data, including medical history, events of note, physical examination and test results as part of my evaluation.

## 2014-02-16 NOTE — Progress Notes (Signed)
Overall stable. No new issues for neurosurgical standpoint. May be mobilized with brace when orthopedically able.

## 2014-02-17 ENCOUNTER — Inpatient Hospital Stay (HOSPITAL_COMMUNITY): Payer: No Typology Code available for payment source

## 2014-02-17 LAB — BASIC METABOLIC PANEL
ANION GAP: 10 (ref 5–15)
BUN: 22 mg/dL (ref 6–23)
CALCIUM: 8.1 mg/dL — AB (ref 8.4–10.5)
CO2: 27 meq/L (ref 19–32)
Chloride: 105 mEq/L (ref 96–112)
Creatinine, Ser: 0.97 mg/dL (ref 0.50–1.35)
GFR calc Af Amer: >90 mL/min (ref >90)
GFR calc non Af Amer: 85 mL/min — ABNORMAL LOW (ref >90)
Glucose, Bld: 182 mg/dL — ABNORMAL HIGH (ref 70–99)
POTASSIUM: 3.4 meq/L — AB (ref 3.7–5.3)
SODIUM: 142 meq/L (ref 137–147)

## 2014-02-17 LAB — GLUCOSE, CAPILLARY
GLUCOSE-CAPILLARY: 109 mg/dL — AB (ref 70–99)
GLUCOSE-CAPILLARY: 145 mg/dL — AB (ref 70–99)
GLUCOSE-CAPILLARY: 142 mg/dL — AB (ref 70–99)
GLUCOSE-CAPILLARY: 143 mg/dL — AB (ref 70–99)
GLUCOSE-CAPILLARY: 137 mg/dL — AB (ref 70–99)
Glucose-Capillary: 158 mg/dL — ABNORMAL HIGH (ref 70–99)

## 2014-02-17 LAB — VANCOMYCIN, TROUGH
Vancomycin Tr: 10.9 ug/mL (ref 10.0–20.0)
Vancomycin Tr: 12.2 ug/mL (ref 10.0–20.0)

## 2014-02-17 LAB — CBC
HCT: 23 % — ABNORMAL LOW (ref 39.0–52.0)
HEMOGLOBIN: 7.4 g/dL — AB (ref 13.0–17.0)
MCH: 32.3 pg (ref 26.0–34.0)
MCHC: 32.2 g/dL (ref 30.0–36.0)
MCV: 100.4 fL — ABNORMAL HIGH (ref 78.0–100.0)
PLATELETS: 195 10*3/uL (ref 150–400)
RBC: 2.29 MIL/uL — ABNORMAL LOW (ref 4.22–5.81)
RDW: 15.7 % — ABNORMAL HIGH (ref 11.5–15.5)
WBC: 4.4 10*3/uL (ref 4.0–10.5)

## 2014-02-17 MED ORDER — POTASSIUM CHLORIDE 20 MEQ/15ML (10%) PO LIQD
40.0000 meq | Freq: Once | ORAL | Status: AC
  Administered 2014-02-17: 40 meq via ORAL
  Filled 2014-02-17: qty 30

## 2014-02-17 MED ORDER — VANCOMYCIN HCL IN DEXTROSE 1-5 GM/200ML-% IV SOLN
1000.0000 mg | Freq: Three times a day (TID) | INTRAVENOUS | Status: DC
  Administered 2014-02-17 – 2014-02-19 (×7): 1000 mg via INTRAVENOUS
  Filled 2014-02-17 (×8): qty 200

## 2014-02-17 MED ORDER — POTASSIUM CHLORIDE 20 MEQ PO PACK: 40.0000 meq | PACK | Freq: Once | ORAL | Status: DC

## 2014-02-17 NOTE — Progress Notes (Signed)
ANTIBIOTIC CONSULT NOTE - FOLLOW UP  Pharmacy Consult for Vancomycin Indication: fevers  Not on File  Patient Measurements: Height: 5\' 7"  (170.2 cm) Weight: 215 lb 13.3 oz (97.9 kg) IBW/kg (Calculated) : 66.1  Vital Signs: Temp: 101.1 F (38.4 C) (10/18 0000) BP: 119/50 mmHg (10/18 0000) Pulse Rate: 100 (10/18 0000) Intake/Output from previous day: 10/17 0701 - 10/18 0700 In: 2265 [I.V.:1445; NG/GT:610; IV Piggyback:150] Out: 2640 [Urine:2640] Intake/Output from this shift: Total I/O In: 825 [I.V.:425; NG/GT:250; IV Piggyback:150] Out: 585 [Urine:585]  Labs:  Recent Labs  02/14/14 0400 02/15/14 0900 02/16/14 0145 02/16/14 2345  WBC 4.1 5.4 3.8* 4.4  HGB 7.7* 7.8* 7.6* 7.4*  PLT 129* 149* 161 195  CREATININE 0.89 1.06 1.07  --    Estimated Creatinine Clearance: 77.7 ml/min (by C-G formula based on Cr of 1.07).  Recent Labs  02/17/14 0159  [place] 10.9     Microbiology: Recent Results (from the past 720 hour(s))  MRSA PCR SCREENING     Status: None   Collection Time    02/09/14  6:02 PM      Result Value Ref Range Status   MRSA by PCR NEGATIVE  NEGATIVE Final   Comment:            The GeneXpert MRSA Assay (FDA     approved for NASAL specimens     only), is one component of a     comprehensive MRSA colonization     surveillance program. It is not     intended to diagnose MRSA     infection nor to guide or     monitor treatment for     MRSA infections.  CULTURE, BLOOD (ROUTINE X 2)     Status: None   Collection Time    02/14/14 12:40 AM      Result Value Ref Range Status   Specimen Description BLOOD LEFT WRIST   Final   Special Requests BOTTLES DRAWN AEROBIC AND ANAEROBIC 10CC   Final   Culture  Setup Time     Final   Value: 02/14/2014 08:41     Performed at [facility]   Culture     Final   Value:        BLOOD CULTURE RECEIVED NO GROWTH TO DATE CULTURE WILL BE HELD FOR 5 DAYS BEFORE ISSUING A FINAL NEGATIVE REPORT     Performed at  [facility]   Report Status PENDING   Incomplete  CULTURE, BLOOD (ROUTINE X 2)     Status: None   Collection Time    02/14/14 12:50 AM      Result Value Ref Range Status   Specimen Description BLOOD LEFT ARM   Final   Special Requests BOTTLES DRAWN AEROBIC ONLY 10CC   Final   Culture  Setup Time     Final   Value: 02/14/2014 08:42     Performed at [facility]   Culture     Final   Value:        BLOOD CULTURE RECEIVED NO GROWTH TO DATE CULTURE WILL BE HELD FOR 5 DAYS BEFORE ISSUING A FINAL NEGATIVE REPORT     Performed at [facility]   Report Status PENDING   Incomplete  CULTURE, RESPIRATORY (NON-EXPECTORATED)     Status: None   Collection Time    02/14/14 12:50 AM      Result Value Ref Range Status   Specimen Description TRACHEAL ASPIRATE   Final   Special  Requests NONE   Final   Gram Stain     Final   Value: ABUNDANT WBC PRESENT, PREDOMINANTLY PMN     NO SQUAMOUS EPITHELIAL CELLS SEEN     NO ORGANISMS SEEN     Performed at [facility]   Culture     Final   Value: Non-Pathogenic Oropharyngeal-type Flora Isolated.     Performed at [facility]   Report Status 02/16/2014 FINAL   Final  URINE CULTURE     Status: None   Collection Time    02/14/14  1:01 AM      Result Value Ref Range Status   Specimen Description URINE, CATHETERIZED   Final   Special Requests none   Final   Culture  Setup Time     Final   Value: 02/14/2014 08:34     Performed at [facility]     Final   Value: NO GROWTH     Performed at [facility]   Culture     Final   Value: NO GROWTH     Performed at [facility]   Report Status 02/15/2014 FINAL   Final    Anti-infectives   Start     Dose/Rate Route Frequency Ordered Stop   02/14/14 1200  vancomycin (VANCOCIN) IVPB 1000 mg/200 mL premix     1,000 mg 200 mL/hr over 60 Minutes Intravenous Every 12 hours 02/13/14 2332     02/14/14 0000  levofloxacin (LEVAQUIN) IVPB 750  mg     750 mg 100 mL/hr over 90 Minutes Intravenous Every 24 hours 02/13/14 2332     02/13/14 2345  vancomycin (VANCOCIN) 2,000 mg in sodium chloride 0.9 % 500 mL IVPB     2,000 mg 250 mL/hr over 120 Minutes Intravenous  Once 02/13/14 2332 02/14/14 0254   02/12/14 0600  ceFAZolin (ANCEF) IVPB 2 g/50 mL premix     2 g 100 mL/hr over 30 Minutes Intravenous On call to O.R. 02/11/14 1429 02/12/14 0554      Assessment: 62 yo male with VDRF/fevers, possible PNA, for empiric antibiotics  Goal of Therapy:  Vancomycin trough level 10-15 mcg/ml  Plan:  Change vancomycin 1 g IV q8h   Drucilla Cumber, Bronson Curb 02/17/2014,2:38 AM

## 2014-02-17 NOTE — Progress Notes (Signed)
No significant change in status. Patient remains on ventilator with IV sedation.  Patient will awaken and follow simple commands. No evidence of focal neurologic deficit.  No new recommendations. TLSO once patient able to be mobilized.

## 2014-02-17 NOTE — Progress Notes (Signed)
Patient ID: [name] [name], male   DOB: May 08, 1950, 63 y.o.   MRN: [id number] Follow up - Trauma Critical Care  Patient Details:    [name] [name] is an 63 y.o. male.  Lines/tubes : Airway 7.5 mm (Active)  Secured at (cm) 23 cm 02/16/2014 11:25 AM  Measured From Lips 02/16/2014 11:25 AM  Secured Location Center 02/16/2014 11:25 AM  Secured By [employer] 02/16/2014 11:25 AM  Tube Holder Repositioned Yes 02/16/2014 11:25 AM  Cuff Pressure (cm H2O) 25 cm H2O 02/15/2014  8:06 PM  Site Condition Dry 02/16/2014 11:25 AM     PICC / Midline Double Lumen 40/98/11 PICC Right Basilic 40 cm 0 cm (Active)  Indication for Insertion or Continuance of Line Poor Vasculature-patient has had multiple peripheral attempts or PIVs lasting less than 24 hours 02/16/2014  8:00 AM  Exposed Catheter (cm) 0 cm 02/14/2014  2:47 PM  Site Assessment Clean;Dry;Intact 02/16/2014  8:00 AM  Lumen #1 Status Flushed;Saline locked;Blood return noted 02/16/2014  8:00 AM  Lumen #2 Status Flushed;Saline locked;Blood return noted 02/16/2014  8:00 AM  Dressing Type Transparent 02/16/2014  8:00 AM  Dressing Status Clean;Dry;Intact;Antimicrobial disc in place 02/16/2014  8:00 AM  Line Care Connections checked and tightened 02/16/2014  8:00 AM  Line Adjustment (NICU/IV Team Only) No 02/14/2014  2:47 PM  Dressing Intervention New dressing 02/14/2014  2:47 PM  Dressing Change Due 02/21/14 02/16/2014  8:00 AM     Chest Tube Left Pleural (Active)  Suction To water seal 02/16/2014  8:00 AM  Chest Tube Air Leak None 02/16/2014  8:00 AM  Patency Intervention Tip/tilt 02/16/2014  8:00 AM  Drainage Description Serous 02/16/2014  8:00 AM  Dressing Status Clean;Dry;Intact 02/16/2014  8:00 AM  Dressing Intervention Dressing changed 02/12/2014  8:00 PM  Site Assessment Clean;Dry;Intact 02/16/2014  8:00 AM  Surrounding Skin Unable to view 02/16/2014  8:00 AM  Output (mL) 70 mL 02/16/2014  6:00 AM     NG/OG Tube Orogastric  16 Fr. Right mouth (Active)  Placement Verification Auscultation 02/16/2014  8:00 AM  Site Assessment Clean;Dry;Intact 02/16/2014  8:00 AM  Status Infusing tube feed 02/16/2014  8:00 AM  Drainage Appearance Tan 02/16/2014  8:00 AM  Gastric Residual 30 mL 02/16/2014  8:00 AM  Intake (mL) 150 mL 02/15/2014 10:00 PM     Urethral Catheter [name], RN Temperature probe 16 Fr. (Active)  Indication for Insertion or Continuance of Catheter Unstable critical patients (first 24-48 hours);Aggressive IV diuresis;Unstable spinal/crush injuries;Peri-operative use for selective surgical procedure 02/16/2014  8:00 AM  Site Assessment Clean;Intact;Dry 02/16/2014  8:00 AM  Catheter Maintenance Bag below level of bladder;Catheter secured;Drainage bag/tubing not touching floor;Insertion date on drainage bag;No dependent loops;Seal intact 02/16/2014  8:00 AM  Collection Container Standard drainage bag 02/16/2014  8:00 AM  Securement Method Securing device (Describe) 02/16/2014  8:00 AM  Urinary Catheter Interventions Unclamped 02/16/2014  8:00 AM  Output (mL) 200 mL 02/16/2014  8:00 AM    Microbiology/Sepsis markers: Results for orders placed during the hospital encounter of 02/09/14  MRSA PCR SCREENING     Status: None   Collection Time    02/09/14  6:02 PM      Result Value Ref Range Status   MRSA by PCR NEGATIVE  NEGATIVE Final   Comment:            The GeneXpert MRSA Assay (FDA     approved for NASAL specimens     only), is one component of a  comprehensive MRSA colonization     surveillance program. It is not     intended to diagnose MRSA     infection nor to guide or     monitor treatment for     MRSA infections.  CULTURE, BLOOD (ROUTINE X 2)     Status: None   Collection Time    02/14/14 12:40 AM      Result Value Ref Range Status   Specimen Description BLOOD LEFT WRIST   Final   Special Requests BOTTLES DRAWN AEROBIC AND ANAEROBIC 10CC   Final   Culture  Setup Time     Final   Value:  02/14/2014 08:41     Performed at [facility]   Culture     Final   Value:        BLOOD CULTURE RECEIVED NO GROWTH TO DATE CULTURE WILL BE HELD FOR 5 DAYS BEFORE ISSUING A FINAL NEGATIVE REPORT     Performed at [facility]   Report Status PENDING   Incomplete  CULTURE, BLOOD (ROUTINE X 2)     Status: None   Collection Time    02/14/14 12:50 AM      Result Value Ref Range Status   Specimen Description BLOOD LEFT ARM   Final   Special Requests BOTTLES DRAWN AEROBIC ONLY 10CC   Final   Culture  Setup Time     Final   Value: 02/14/2014 08:42     Performed at [facility]   Culture     Final   Value:        BLOOD CULTURE RECEIVED NO GROWTH TO DATE CULTURE WILL BE HELD FOR 5 DAYS BEFORE ISSUING A FINAL NEGATIVE REPORT     Performed at [facility]   Report Status PENDING   Incomplete  CULTURE, RESPIRATORY (NON-EXPECTORATED)     Status: None   Collection Time    02/14/14 12:50 AM      Result Value Ref Range Status   Specimen Description TRACHEAL ASPIRATE   Final   Special Requests NONE   Final   Gram Stain     Final   Value: ABUNDANT WBC PRESENT, PREDOMINANTLY PMN     NO SQUAMOUS EPITHELIAL CELLS SEEN     NO ORGANISMS SEEN     Performed at [facility]   Culture     Final   Value: Non-Pathogenic Oropharyngeal-type Flora Isolated.     Performed at [facility]   Report Status 02/16/2014 FINAL   Final  URINE CULTURE     Status: None   Collection Time    02/14/14  1:01 AM      Result Value Ref Range Status   Specimen Description URINE, CATHETERIZED   Final   Special Requests none   Final   Culture  Setup Time     Final   Value: 02/14/2014 08:34     Performed at [facility]     Final   Value: NO GROWTH     Performed at [facility]   Culture     Final   Value: NO GROWTH     Performed at [facility]   Report Status 02/15/2014 FINAL   Final    Anti-infectives:  Anti-infectives   Start      Dose/Rate Route Frequency Ordered Stop   02/17/14 1000  vancomycin (VANCOCIN) IVPB 1000 mg/200 mL premix     1,000 mg 200 mL/hr over  60 Minutes Intravenous Every 8 hours 02/17/14 0242     02/14/14 1200  vancomycin (VANCOCIN) IVPB 1000 mg/200 mL premix  Status:  Discontinued     1,000 mg 200 mL/hr over 60 Minutes Intravenous Every 12 hours 02/13/14 2332 02/17/14 0242   02/14/14 0000  levofloxacin (LEVAQUIN) IVPB 750 mg     750 mg 100 mL/hr over 90 Minutes Intravenous Every 24 hours 02/13/14 2332     02/13/14 2345  vancomycin (VANCOCIN) 2,000 mg in sodium chloride 0.9 % 500 mL IVPB     2,000 mg 250 mL/hr over 120 Minutes Intravenous  Once 02/13/14 2332 02/14/14 0254   02/12/14 0600  ceFAZolin (ANCEF) IVPB 2 g/50 mL premix     2 g 100 mL/hr over 30 Minutes Intravenous On call to O.R. 02/11/14 1429 02/12/14 0554      Best Practice/Protocols:  VTE Prophylaxis: Lovenox (prophylaxtic dose) Continous Sedation  Consults: Treatment Team:  Wylene Simmer, MD Ashok Pall, MD    Studies:    Events: Poorly tolerating weaning.   Subjective:    Overnight Issues:   Objective:  Vital signs for last 24 hours: Temp:  [98.4 F (36.9 C)-101.5 F (38.6 C)] 100.4 F (38 C) (10/18 0900) Pulse Rate:  [74-116] 112 (10/18 0900) Resp:  [16-30] 29 (10/18 0900) BP: (98-183)/(46-142) 160/66 mmHg (10/18 0900) SpO2:  [92 %-100 %] 93 % (10/18 0917) FiO2 (%):  [40 %] 40 % (10/18 0917) Weight:  [229 lb 11.5 oz (104.2 kg)] 229 lb 11.5 oz (104.2 kg) (10/18 0500)  Hemodynamic parameters for last 24 hours:    Intake/Output from previous day: 10/17 0701 - 10/18 0700 In: 3670 [I.V.:2040; NG/GT:1020; IV Piggyback:550] Out: 2353 [Urine:3180; Chest Tube:140]  Intake/Output this shift: Total I/O In: 260 [I.V.:170; NG/GT:90] Out: 210 [Urine:210]  Vent settings for last 24 hours: Vent Mode:  [-] PRVC FiO2 (%):  [40 %] 40 % Set Rate:  [20 bmp] 20 bmp Vt Set:  [550 mL] 550 mL PEEP:  [5 cmH20] 5  cmH20 Pressure Support:  [5 cmH20-8 cmH20] 5 cmH20 Plateau Pressure:  [21 IRW43-15 cmH20] 21 cmH20  Physical Exam:  General: on vent.  Very anxious appearing with PS 5/5.  Respiratory at bedside.   Neuro: arouses and F/C.  Wound on left frontotemporal region without erythema or drainage.   HEENT/Neck: ETT Resp: tachypneic, secretions in ET tube.   CVS: RRR GI: soft, NT, +BS Extremities: mild edema  Results for orders placed during the hospital encounter of 02/09/14 (from the past 24 hour(s))  GLUCOSE, CAPILLARY     Status: Abnormal   Collection Time    02/16/14 12:37 PM      Result Value Ref Range   Glucose-Capillary 182 (*) 70 - 99 mg/dL  GLUCOSE, CAPILLARY     Status: Abnormal   Collection Time    02/16/14  4:34 PM      Result Value Ref Range   Glucose-Capillary 153 (*) 70 - 99 mg/dL  GLUCOSE, CAPILLARY     Status: Abnormal   Collection Time    02/16/14  8:04 PM      Result Value Ref Range   Glucose-Capillary 107 (*) 70 - 99 mg/dL   Comment 1 Documented in Chart     Comment 2 Notify RN    CBC     Status: Abnormal   Collection Time    02/16/14 11:45 PM      Result Value Ref Range   WBC 4.4  4.0 - 10.5 K/uL  RBC 2.29 (*) 4.22 - 5.81 MIL/uL   Hemoglobin 7.4 (*) 13.0 - 17.0 g/dL   HCT 23.0 (*) 39.0 - 52.0 %   MCV 100.4 (*) 78.0 - 100.0 fL   MCH 32.3  26.0 - 34.0 pg   MCHC 32.2  30.0 - 36.0 g/dL   RDW 15.7 (*) 11.5 - 15.5 %   Platelets 195  150 - 400 K/uL  BASIC METABOLIC PANEL     Status: Abnormal   Collection Time    02/16/14 11:45 PM      Result Value Ref Range   Sodium 142  137 - 147 mEq/L   Potassium 3.4 (*) 3.7 - 5.3 mEq/L   Chloride 105  96 - 112 mEq/L   CO2 27  19 - 32 mEq/L   Glucose, Bld 182 (*) 70 - 99 mg/dL   BUN 22  6 - 23 mg/dL   Creatinine, Ser 0.97  0.50 - 1.35 mg/dL   Calcium 8.1 (*) 8.4 - 10.5 mg/dL   GFR calc non Af Amer 85 (*) >90 mL/min   GFR calc Af Amer >90  >90 mL/min   Anion gap 10  5 - 15  VANCOMYCIN, TROUGH     Status: None    Collection Time    02/16/14 11:45 PM      Result Value Ref Range   Vancomycin Tr 12.2  10.0 - 20.0 ug/mL  GLUCOSE, CAPILLARY     Status: Abnormal   Collection Time    02/17/14 12:16 AM      Result Value Ref Range   Glucose-Capillary 158 (*) 70 - 99 mg/dL   Comment 1 Documented in Chart     Comment 2 Notify RN    VANCOMYCIN, TROUGH     Status: None   Collection Time    02/17/14  1:59 AM      Result Value Ref Range   Vancomycin Tr 10.9  10.0 - 20.0 ug/mL  GLUCOSE, CAPILLARY     Status: Abnormal   Collection Time    02/17/14  4:05 AM      Result Value Ref Range   Glucose-Capillary 109 (*) 70 - 99 mg/dL   Comment 1 Documented in Chart     Comment 2 Notify RN    GLUCOSE, CAPILLARY     Status: Abnormal   Collection Time    02/17/14  8:09 AM      Result Value Ref Range   Glucose-Capillary 145 (*) 70 - 99 mg/dL    Assessment & Plan: Present on Admission:  . Blunt chest trauma . Fracture of lumbar spine . Multiple rib fractures involving four or more ribs . Pneumothorax on right . Traumatic mesenteric hematoma   LOS: 8 days   Additional comments:I reviewed the patient's new clinical lab test results. and cxr [facility] Ventilator dependent respiratory failure -  scheduled BDs, continue trying to wean, increased Klonopin and Seroquel 10/16. Respiratory to try lavage and attempt wean again.  Was not tolerating this AM with HR 130 and RR 38. Lung fields slightly improved.    B rib FXs, R PTX/B pulm contusions - No PTX on CXR, R CT on H2O seal, output still too high for removal.   PEA arrest at scene - uncertain etiology, troponin and EKG unremarkable, possibly due to PTX L trimal ankle FX - ORIF by Dr. Doran Durand, NWB L1 endplate FX - TLSO per Dr. Christella Noa  ID - Vanc and Levaquin empiric, all cultures remain NGTD. WBCs remain normal  FEN - add reglan L forehead lac ETOH abuse - CIWA, Precedex ABL anemia - check labs ow VTE - PAS R,  Lovenox Dispo - ICU Critical Care Total Time*: 31  Minutes  Trauma: [phone]   02/17/2014  *Care during the described time interval was provided by me. I have reviewed this patient's available data, including medical history, events of note, physical examination and test results as part of my evaluation.

## 2014-02-18 LAB — GLUCOSE, CAPILLARY
GLUCOSE-CAPILLARY: 142 mg/dL — AB (ref 70–99)
GLUCOSE-CAPILLARY: 159 mg/dL — AB (ref 70–99)
Glucose-Capillary: 114 mg/dL — ABNORMAL HIGH (ref 70–99)
Glucose-Capillary: 129 mg/dL — ABNORMAL HIGH (ref 70–99)
Glucose-Capillary: 132 mg/dL — ABNORMAL HIGH (ref 70–99)
Glucose-Capillary: 106 mg/dL — ABNORMAL HIGH (ref 70–99)
Glucose-Capillary: 110 mg/dL — ABNORMAL HIGH (ref 70–99)

## 2014-02-18 MED ORDER — BISACODYL 10 MG RE SUPP
10.0000 mg | Freq: Once | RECTAL | Status: AC
  Administered 2014-02-18: 10 mg via RECTAL
  Filled 2014-02-18: qty 1

## 2014-02-18 MED ORDER — POTASSIUM CHLORIDE 20 MEQ/15ML (10%) PO LIQD
20.0000 meq | Freq: Two times a day (BID) | ORAL | Status: DC
  Administered 2014-02-18 – 2014-02-23 (×11): 20 meq
  Filled 2014-02-18 (×12): qty 15

## 2014-02-18 MED ORDER — FUROSEMIDE 10 MG/ML IJ SOLN
40.0000 mg | Freq: Once | INTRAMUSCULAR | Status: AC
  Administered 2014-02-18: 40 mg via INTRAVENOUS
  Filled 2014-02-18: qty 4

## 2014-02-18 NOTE — Clinical Social Work Note (Signed)
Patient remains in ICU- attempting to wean per MD notes- CSW continues to follow to assess and assist as needed- once patient is weaned from vent and we are able to better assess his needs/abilities, etc we will proceed with dc planning and support. Please call CSW as needed.  Eduard Clos, MSW, [place]

## 2014-02-18 NOTE — Progress Notes (Signed)
Follow up - Trauma and Critical Care  Patient Details:    [name] [name] is an 63 y.o. male.  Lines/tubes : Airway 7.5 mm (Active)  Secured at (cm) 23 cm 02/18/2014  7:50 AM  Measured From Lips 02/18/2014  7:50 AM  Secured Location Right 02/18/2014  7:50 AM  Secured By [employer] 02/18/2014  7:50 AM  Tube Holder Repositioned Yes 02/18/2014  7:37 AM  Cuff Pressure (cm H2O) 27 cm H2O 02/17/2014  8:33 PM  Site Condition Dry 02/18/2014  7:50 AM     PICC / Midline Double Lumen 70/96/28 PICC Right Basilic 40 cm 0 cm (Active)  Indication for Insertion or Continuance of Line Poor Vasculature-patient has had multiple peripheral attempts or PIVs lasting less than 24 hours 02/18/2014  7:50 AM  Exposed Catheter (cm) 0 cm 02/18/2014  7:50 AM  Site Assessment Clean;Dry;Intact 02/18/2014  7:50 AM  Lumen #1 Status Infusing 02/18/2014  7:50 AM  Lumen #2 Status Infusing 02/18/2014  7:50 AM  Dressing Type Transparent;Occlusive 02/18/2014  7:50 AM  Dressing Status Clean;Dry;Intact;Antimicrobial disc in place 02/18/2014  7:50 AM  Line Care Connections checked and tightened 02/18/2014  7:50 AM  Line Adjustment (NICU/IV Team Only) No 02/14/2014  2:47 PM  Dressing Intervention New dressing 02/14/2014  2:47 PM  Dressing Change Due 02/21/14 02/17/2014  8:00 PM     Chest Tube Left Pleural (Active)  Suction To water seal 02/18/2014  7:50 AM  Chest Tube Air Leak None 02/18/2014  7:50 AM  Patency Intervention Tip/tilt 02/18/2014  7:50 AM  Drainage Description Serous 02/18/2014  7:50 AM  Dressing Status Clean;Dry;Intact 02/18/2014  7:50 AM  Dressing Intervention Dressing reinforced 02/16/2014  8:00 PM  Site Assessment Other (Comment) 02/18/2014  7:50 AM  Surrounding Skin Unable to view 02/18/2014  7:50 AM  Output (mL) 0 mL 02/18/2014  8:00 AM     NG/OG Tube Orogastric 16 Fr. Right mouth (Active)  Placement Verification Auscultation 02/18/2014  7:50 AM  Site Assessment Clean;Dry;Intact  02/18/2014  7:50 AM  Status Infusing tube feed 02/18/2014  7:50 AM  Drainage Appearance Tan 02/18/2014  7:50 AM  Gastric Residual 0 mL 02/17/2014  8:00 PM  Intake (mL) 40 mL 02/18/2014  5:00 AM  Output (mL) 0 mL 02/18/2014  8:00 AM     Urethral Catheter [name], RN Temperature probe 16 Fr. (Active)  Indication for Insertion or Continuance of Catheter Acute urinary retention;Unstable critical patients (first 24-48 hours);Aggressive IV diuresis;Unstable spinal/crush injuries 02/18/2014  7:50 AM  Site Assessment Clean;Intact;Dry 02/18/2014  7:50 AM  Catheter Maintenance Bag below level of bladder;Catheter secured;Drainage bag/tubing not touching floor;Insertion date on drainage bag;No dependent loops;Seal intact;Bag emptied prior to transport 02/18/2014  7:50 AM  Collection Container Standard drainage bag 02/18/2014  7:50 AM  Securement Method Securing device (Describe) 02/18/2014  7:50 AM  Urinary Catheter Interventions Unclamped 02/18/2014  7:50 AM  Output (mL) 110 mL 02/18/2014  8:00 AM    Microbiology/Sepsis markers: Results for orders placed during the hospital encounter of 02/09/14  MRSA PCR SCREENING     Status: None   Collection Time    02/09/14  6:02 PM      Result Value Ref Range Status   MRSA by PCR NEGATIVE  NEGATIVE Final   Comment:            The GeneXpert MRSA Assay (FDA     approved for NASAL specimens     only), is one component of a  comprehensive MRSA colonization     surveillance program. It is not     intended to diagnose MRSA     infection nor to guide or     monitor treatment for     MRSA infections.  CULTURE, BLOOD (ROUTINE X 2)     Status: None   Collection Time    02/14/14 12:40 AM      Result Value Ref Range Status   Specimen Description BLOOD LEFT WRIST   Final   Special Requests BOTTLES DRAWN AEROBIC AND ANAEROBIC 10CC   Final   Culture  Setup Time     Final   Value: 02/14/2014 08:41     Performed at [facility]   Culture     Final    Value:        BLOOD CULTURE RECEIVED NO GROWTH TO DATE CULTURE WILL BE HELD FOR 5 DAYS BEFORE ISSUING A FINAL NEGATIVE REPORT     Performed at [facility]   Report Status PENDING   Incomplete  CULTURE, BLOOD (ROUTINE X 2)     Status: None   Collection Time    02/14/14 12:50 AM      Result Value Ref Range Status   Specimen Description BLOOD LEFT ARM   Final   Special Requests BOTTLES DRAWN AEROBIC ONLY 10CC   Final   Culture  Setup Time     Final   Value: 02/14/2014 08:42     Performed at [facility]   Culture     Final   Value:        BLOOD CULTURE RECEIVED NO GROWTH TO DATE CULTURE WILL BE HELD FOR 5 DAYS BEFORE ISSUING A FINAL NEGATIVE REPORT     Performed at [facility]   Report Status PENDING   Incomplete  CULTURE, RESPIRATORY (NON-EXPECTORATED)     Status: None   Collection Time    02/14/14 12:50 AM      Result Value Ref Range Status   Specimen Description TRACHEAL ASPIRATE   Final   Special Requests NONE   Final   Gram Stain     Final   Value: ABUNDANT WBC PRESENT, PREDOMINANTLY PMN     NO SQUAMOUS EPITHELIAL CELLS SEEN     NO ORGANISMS SEEN     Performed at [facility]   Culture     Final   Value: Non-Pathogenic Oropharyngeal-type Flora Isolated.     Performed at [facility]   Report Status 02/16/2014 FINAL   Final  URINE CULTURE     Status: None   Collection Time    02/14/14  1:01 AM      Result Value Ref Range Status   Specimen Description URINE, CATHETERIZED   Final   Special Requests none   Final   Culture  Setup Time     Final   Value: 02/14/2014 08:34     Performed at [place]     Final   Value: NO GROWTH     Performed at [facility]   Culture     Final   Value: NO GROWTH     Performed at [facility]   Report Status 02/15/2014 FINAL   Final    Anti-infectives:  Anti-infectives   Start     Dose/Rate Route Frequency Ordered Stop   02/17/14 1000  vancomycin  (VANCOCIN) IVPB 1000 mg/200 mL premix     1,000 mg 200 mL/hr over  60 Minutes Intravenous Every 8 hours 02/17/14 0242     02/14/14 1200  vancomycin (VANCOCIN) IVPB 1000 mg/200 mL premix  Status:  Discontinued     1,000 mg 200 mL/hr over 60 Minutes Intravenous Every 12 hours 02/13/14 2332 02/17/14 0242   02/14/14 0000  levofloxacin (LEVAQUIN) IVPB 750 mg     750 mg 100 mL/hr over 90 Minutes Intravenous Every 24 hours 02/13/14 2332     02/13/14 2345  vancomycin (VANCOCIN) 2,000 mg in sodium chloride 0.9 % 500 mL IVPB     2,000 mg 250 mL/hr over 120 Minutes Intravenous  Once 02/13/14 2332 02/14/14 0254   02/12/14 0600  ceFAZolin (ANCEF) IVPB 2 g/50 mL premix     2 g 100 mL/hr over 30 Minutes Intravenous On call to O.R. 02/11/14 1429 02/12/14 0554      Best Practice/Protocols:  VTE Prophylaxis: Lovenox (prophylaxtic dose) and Mechanical GI Prophylaxis: Proton Pump Inhibitor Continous Sedation IV fentanyl drip  Consults: Treatment Team:  Wylene Simmer, MD Ashok Pall, MD    Events:  Subjective:    Overnight Issues: Patient seems to be comfortable on ventilator weaning with PS 14/5.  On fentanyl drip.  Objective:  Vital signs for last 24 hours: Temp:  [99 F (37.2 C)-102 F (38.9 C)] 99.7 F (37.6 C) (10/19 0700) Pulse Rate:  [69-112] 70 (10/19 0746) Resp:  [0-29] 19 (10/19 0746) BP: (85-160)/(38-126) 110/55 mmHg (10/19 0737) SpO2:  [90 %-100 %] 98 % (10/19 0746) FiO2 (%):  [40 %-50 %] 40 % (10/19 0746) Weight:  [101.9 kg (224 lb 10.4 oz)] 101.9 kg (224 lb 10.4 oz) (10/19 0500)  Hemodynamic parameters for last 24 hours:    Intake/Output from previous day: 10/18 0701 - 10/19 0700 In: 3955 [I.V.:2105; NG/GT:1100; IV Piggyback:750] Out: 2340 [Urine:2210; Chest Tube:130]  Intake/Output this shift: Total I/O In: 115 [I.V.:85; NG/GT:30] Out: 110 [Urine:110]  Vent settings for last 24 hours: Vent Mode:  [-] CPAP;PSV FiO2 (%):  [40 %-50 %] 40 % Set Rate:  [20 bmp] 20  bmp Vt Set:  [550 mL] 550 mL PEEP:  [5 cmH20] 5 cmH20 Pressure Support:  [14 cmH20] 14 cmH20 Plateau Pressure:  [21 cmH20-29 cmH20] 22 cmH20  Physical Exam:  General: no respiratory distress Neuro: nonfocal exam and RASS 0 Resp: clear to auscultation bilaterally GI: soft, nontender, BS WNL, no r/g and tolerating tube feedings well Extremities: No changes  Results for orders placed during the hospital encounter of 02/09/14 (from the past 24 hour(s))  GLUCOSE, CAPILLARY     Status: Abnormal   Collection Time    02/17/14 11:47 AM      Result Value Ref Range   Glucose-Capillary 142 (*) 70 - 99 mg/dL  GLUCOSE, CAPILLARY     Status: Abnormal   Collection Time    02/17/14  3:59 PM      Result Value Ref Range   Glucose-Capillary 143 (*) 70 - 99 mg/dL  GLUCOSE, CAPILLARY     Status: Abnormal   Collection Time    02/17/14  8:21 PM      Result Value Ref Range   Glucose-Capillary 137 (*) 70 - 99 mg/dL   Comment 1 Documented in Chart     Comment 2 Notify RN    GLUCOSE, CAPILLARY     Status: Abnormal   Collection Time    02/17/14 11:50 PM      Result Value Ref Range   Glucose-Capillary 159 (*) 70 - 99 mg/dL   Comment  1 Documented in Chart     Comment 2 Notify RN    GLUCOSE, CAPILLARY     Status: Abnormal   Collection Time    02/18/14  4:10 AM      Result Value Ref Range   Glucose-Capillary 114 (*) 70 - 99 mg/dL   Comment 1 Documented in Chart     Comment 2 Notify RN    GLUCOSE, CAPILLARY     Status: Abnormal   Collection Time    02/18/14  8:21 AM      Result Value Ref Range   Glucose-Capillary 129 (*) 70 - 99 mg/dL   Comment 1 Notify RN     Comment 2 Documented in Chart       Assessment/Plan:   NEURO  Altered Mental Status:  sedation   Plan: Will wean as the patient weans on the ventilator  PULM  Atelectasis/collapse (focal and left lung)   Plan: Will consider discontinuing right chest tube.  Chest tube has been on waterseal.  CARDIO  No issues   Plan: No changes   RENAL  Urine output is good, but the patient is overal positive onfluids   Plan: Consider Lasix.  KCl is a bit low, will replace  GI  Tolerating tube feedings   Plan: No changes for now  ID  No known infections   Plan: On empiric antibiotics.  Consider stopping them  HEME  Anemia acute blood loss anemia and anemia of critical illness)   Plan: No blood for now  ENDO No specific issues   Plan: CPM  Global Issues  Patient is slow to wean.  Now on 14/5, seems very comfortable.  May do better with some diuresis.  Will replace KCl.  Has some ecchymosis on hisleft flank, etiology unknown.  Will watch and follow..    LOS: 9 days   Additional comments:I reviewed the patient's new clinical lab test results. cbc/bmet and I reviewed the patients new imaging test results. cxr from yesterday  Critical Care Total Time*: 30 Minutes  Shelsea Hangartner, JAY 02/18/2014  *Care during the described time interval was provided by me and/or other providers on the critical care team.  I have reviewed this patient's available data, including medical history, events of note, physical examination and test results as part of my evaluation.

## 2014-02-18 NOTE — Progress Notes (Signed)
Subjective: 6 Days Post-Op Procedure(s) (LRB): OPEN REDUCTION INTERNAL FIXATION (ORIF) LEFT ANKLE FRACTURE  (Left) Closed Reduction  PERCUTANEOUS PINNING left great toe (Left) Patient heavily sedated and intubated; unable to provide historical information. No family at bedside. No new concerns from nursing. Hopeful to extubate soon.   Objective: Vital signs in last 24 hours: Temp:  [99 F (37.2 C)-102 F (38.9 C)] 99.5 F (37.5 C) (10/19 1300) Pulse Rate:  [69-100] 76 (10/19 1300) Resp:  [0-33] 25 (10/19 1300) BP: (85-159)/(38-126) 105/50 mmHg (10/19 1300) SpO2:  [90 %-100 %] 97 % (10/19 1300) FiO2 (%):  [40 %] 40 % (10/19 1300) Weight:  [101.9 kg (224 lb 10.4 oz)] 101.9 kg (224 lb 10.4 oz) (10/19 0500)  Intake/Output from previous day: 10/18 0701 - 10/19 0700 In: 6761 [I.V.:2105; NG/GT:1100; IV Piggyback:750] Out: 2340 [Urine:2210; Chest Tube:130] Intake/Output this shift: Total I/O In: 1070 [I.V.:510; NG/GT:360; IV Piggyback:200] Out: 1370 [Urine:1320; Chest Tube:50]   Recent Labs  02/16/14 0145 02/16/14 2345  HGB 7.6* 7.4*    Recent Labs  02/16/14 0145 02/16/14 2345  WBC 3.8* 4.4  RBC 2.33* 2.29*  HCT 23.4* 23.0*  PLT 161 195    Recent Labs  02/16/14 0145 02/16/14 2345  NA 145 142  K 3.7 3.4*  CL 108 105  CO2 27 27  BUN 22 22  CREATININE 1.07 0.97  GLUCOSE 111* 182*  CALCIUM 8.1* 8.1*   No results found for this basename: LABPT, INR,  in the last 72 hours  Physical Exam: WD/WN caucasian male, heavily sedated and intubated on CPAP. GCS 11T, opens eye spontaneously and follows commands. EOMI. Respirations per vent. OG tube with tube feeds in place.CT in place to R side; serosanguinous drainage. Abdomen soft. LLE in hard splint and ACE wrap to L knee. Dressings C/D/I. NV intact with brisk capillary refill. Moves toes spontaneously and with command. Double pins in L hallux in place and stable; no signs of infection. Unable to assess distal sensation due to  intubation. No calf swelling or palpable cords.   Assessment/Plan: 6 Days Post-Op Procedure(s) (LRB): OPEN REDUCTION INTERNAL FIXATION (ORIF) LEFT ANKLE FRACTURE  (Left) Closed Reduction  PERCUTANEOUS PINNING left great toe (Left) -NWB LLE; Knee ROM okay with PT  -Continue treatment for other medical problems per trauma team   [name] [name] 02/18/2014, 1:52 PM [phone]

## 2014-02-19 ENCOUNTER — Inpatient Hospital Stay (HOSPITAL_COMMUNITY): Payer: No Typology Code available for payment source

## 2014-02-19 LAB — GLUCOSE, CAPILLARY
GLUCOSE-CAPILLARY: 98 mg/dL (ref 70–99)
GLUCOSE-CAPILLARY: 149 mg/dL — AB (ref 70–99)
GLUCOSE-CAPILLARY: 121 mg/dL — AB (ref 70–99)
Glucose-Capillary: 135 mg/dL — ABNORMAL HIGH (ref 70–99)
Glucose-Capillary: 104 mg/dL — ABNORMAL HIGH (ref 70–99)
Glucose-Capillary: 109 mg/dL — ABNORMAL HIGH (ref 70–99)

## 2014-02-19 LAB — BASIC METABOLIC PANEL
Anion gap: 11 (ref 5–15)
BUN: 27 mg/dL — ABNORMAL HIGH (ref 6–23)
CALCIUM: 8.3 mg/dL — AB (ref 8.4–10.5)
CO2: 30 meq/L (ref 19–32)
CREATININE: 0.94 mg/dL (ref 0.50–1.35)
Chloride: 102 mEq/L (ref 96–112)
GFR calc non Af Amer: 86 mL/min — ABNORMAL LOW (ref >90)
Glucose, Bld: 122 mg/dL — ABNORMAL HIGH (ref 70–99)
Potassium: 3.4 mEq/L — ABNORMAL LOW (ref 3.7–5.3)
Sodium: 143 mEq/L (ref 137–147)

## 2014-02-19 LAB — CBC WITH DIFFERENTIAL/PLATELET
BASOS PCT: 0 % (ref 0–1)
Basophils Absolute: 0 10*3/uL (ref 0.0–0.1)
EOS PCT: 2 % (ref 0–5)
Eosinophils Absolute: 0.2 10*3/uL (ref 0.0–0.7)
HEMATOCRIT: 23.1 % — AB (ref 39.0–52.0)
Hemoglobin: 7.3 g/dL — ABNORMAL LOW (ref 13.0–17.0)
Lymphocytes Relative: 14 % (ref 12–46)
Lymphs Abs: 1 10*3/uL (ref 0.7–4.0)
MCH: 30.9 pg (ref 26.0–34.0)
MCHC: 31.6 g/dL (ref 30.0–36.0)
MCV: 97.9 fL (ref 78.0–100.0)
MONO ABS: 0.7 10*3/uL (ref 0.1–1.0)
Monocytes Relative: 10 % (ref 3–12)
Neutro Abs: 5 10*3/uL (ref 1.7–7.7)
Neutrophils Relative %: 74 % (ref 43–77)
Platelets: 292 10*3/uL (ref 150–400)
RBC: 2.36 MIL/uL — ABNORMAL LOW (ref 4.22–5.81)
RDW: 16.4 % — AB (ref 11.5–15.5)
WBC: 6.9 10*3/uL (ref 4.0–10.5)

## 2014-02-19 MED ORDER — ALBUMIN HUMAN 5 % IV SOLN
25.0000 g | Freq: Once | INTRAVENOUS | Status: AC
  Administered 2014-02-19: 25 g via INTRAVENOUS
  Filled 2014-02-19: qty 500

## 2014-02-19 MED ORDER — POTASSIUM CHLORIDE 10 MEQ/50ML IV SOLN
10.0000 meq | INTRAVENOUS | Status: AC
  Administered 2014-02-19 (×2): 10 meq via INTRAVENOUS
  Filled 2014-02-19 (×2): qty 50

## 2014-02-19 MED ORDER — POTASSIUM CHLORIDE 10 MEQ/50ML IV SOLN: 10.0000 meq | INTRAVENOUS | Status: DC

## 2014-02-19 MED ORDER — FUROSEMIDE 10 MG/ML IJ SOLN
20.0000 mg | Freq: Once | INTRAMUSCULAR | Status: AC
  Administered 2014-02-19: 20 mg via INTRAVENOUS
  Filled 2014-02-19: qty 2

## 2014-02-19 NOTE — Discharge Instructions (Signed)
Do not bear any weight on the left foot.  Keep the pins protected.

## 2014-02-19 NOTE — Progress Notes (Signed)
Subjective: 7 Days Post-Op Procedure(s) (LRB): OPEN REDUCTION INTERNAL FIXATION (ORIF) LEFT ANKLE FRACTURE  (Left) Closed Reduction  PERCUTANEOUS PINNING left great toe (Left) Pt still intubated and sedated.    Objective: Vital signs in last 24 hours: Temp:  [98.8 F (37.1 C)-102 F (38.9 C)] 99.5 F (37.5 C) (10/20 1400) Pulse Rate:  [60-92] 73 (10/20 1400) Resp:  [19-26] 23 (10/20 1240) BP: (83-139)/(40-67) 92/42 mmHg (10/20 1400) SpO2:  [93 %-100 %] 95 % (10/20 1400) FiO2 (%):  [40 %] 40 % (10/20 1400) Weight:  [101 kg (222 lb 10.6 oz)] 101 kg (222 lb 10.6 oz) (10/20 3545)  Intake/Output from previous day: 10/19 0701 - 10/20 0700 In: 3167 [I.V.:1337; NG/GT:1080; IV Piggyback:750] Out: 3160 [GYBWL:8937; Emesis/NG output:90; Chest Tube:50] Intake/Output this shift: Total I/O In: 1735 [I.V.:1275; NG/GT:210; IV Piggyback:250] Out: 455 [Urine:425; Chest Tube:30]   Recent Labs  02/16/14 2345 02/19/14 0545  HGB 7.4* 7.3*    Recent Labs  02/16/14 2345 02/19/14 0545  WBC 4.4 6.9  RBC 2.29* 2.36*  HCT 23.0* 23.1*  PLT 195 292    Recent Labs  02/16/14 2345 02/19/14 0545  NA 142 143  K 3.4* 3.4*  CL 105 102  CO2 27 30  BUN 22 27*  CREATININE 0.97 0.94  GLUCOSE 182* 122*  CALCIUM 8.1* 8.3*   No results found for this basename: LABPT, INR,  in the last 72 hours  PE:  L LE splinted.  Pins in place at left hallux.  No signs of infection.  Assessment/Plan: 7 Days Post-Op Procedure(s) (LRB): OPEN REDUCTION INTERNAL FIXATION (ORIF) LEFT ANKLE FRACTURE  (Left) Closed Reduction  PERCUTANEOUS PINNING left great toe (Left)  Continue NWB on L LE.  Pt's sutures will need to be removed 2- 4 weeks post op.  Please give me a call at [phone] if he's still in house.  O/w he can f/u in the office.  Epic updated.  Wylene Simmer 02/19/2014, 2:34 PM
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
## 2014-02-20 ENCOUNTER — Inpatient Hospital Stay (HOSPITAL_COMMUNITY): Payer: No Typology Code available for payment source

## 2014-02-20 LAB — CULTURE, BLOOD (ROUTINE X 2)
Culture: NO GROWTH
Culture: NO GROWTH

## 2014-02-20 LAB — CBC
HCT: 22.2 % — ABNORMAL LOW (ref 39.0–52.0)
HEMOGLOBIN: 7 g/dL — AB (ref 13.0–17.0)
MCH: 32 pg (ref 26.0–34.0)
MCHC: 31.5 g/dL (ref 30.0–36.0)
MCV: 101.4 fL — ABNORMAL HIGH (ref 78.0–100.0)
Platelets: 315 10*3/uL (ref 150–400)
RBC: 2.19 MIL/uL — AB (ref 4.22–5.81)
RDW: 16.4 % — ABNORMAL HIGH (ref 11.5–15.5)
WBC: 6.9 10*3/uL (ref 4.0–10.5)

## 2014-02-20 LAB — GLUCOSE, CAPILLARY
GLUCOSE-CAPILLARY: 111 mg/dL — AB (ref 70–99)
Glucose-Capillary: 133 mg/dL — ABNORMAL HIGH (ref 70–99)
Glucose-Capillary: 134 mg/dL — ABNORMAL HIGH (ref 70–99)
Glucose-Capillary: 124 mg/dL — ABNORMAL HIGH (ref 70–99)
Glucose-Capillary: 123 mg/dL — ABNORMAL HIGH (ref 70–99)
Glucose-Capillary: 99 mg/dL (ref 70–99)

## 2014-02-20 LAB — BASIC METABOLIC PANEL
Anion gap: 8 (ref 5–15)
BUN: 30 mg/dL — ABNORMAL HIGH (ref 6–23)
CALCIUM: 8 mg/dL — AB (ref 8.4–10.5)
CHLORIDE: 105 meq/L (ref 96–112)
CO2: 30 mEq/L (ref 19–32)
Creatinine, Ser: 0.92 mg/dL (ref 0.50–1.35)
GFR calc Af Amer: >90 mL/min (ref >90)
GFR calc non Af Amer: 87 mL/min — ABNORMAL LOW (ref >90)
GLUCOSE: 122 mg/dL — AB (ref 70–99)
Potassium: 3.8 mEq/L (ref 3.7–5.3)
SODIUM: 143 meq/L (ref 137–147)

## 2014-02-20 MED ORDER — POLYETHYLENE GLYCOL 3350 17 G PO PACK
17.0000 g | PACK | Freq: Every day | ORAL | Status: DC
  Administered 2014-02-20 – 2014-02-22 (×3): 17 g
  Filled 2014-02-20 (×4): qty 1

## 2014-02-20 MED ORDER — ENOXAPARIN SODIUM 30 MG/0.3ML ~~LOC~~ SOLN
30.0000 mg | Freq: Two times a day (BID) | SUBCUTANEOUS | Status: DC
  Administered 2014-02-20 – 2014-03-05 (×27): 30 mg via SUBCUTANEOUS
  Filled 2014-02-20 (×31): qty 0.3

## 2014-02-20 MED ORDER — FUROSEMIDE 10 MG/ML PO SOLN
20.0000 mg | Freq: Two times a day (BID) | ORAL | Status: DC
  Administered 2014-02-20 – 2014-02-23 (×7): 20 mg
  Filled 2014-02-20 (×9): qty 2

## 2014-02-20 MED ORDER — BACITRACIN ZINC 500 UNIT/GM EX OINT
TOPICAL_OINTMENT | Freq: Two times a day (BID) | CUTANEOUS | Status: DC
Start: 2014-02-20 — End: 2014-03-05
  Administered 2014-02-20 – 2014-02-23 (×7): via TOPICAL
  Administered 2014-02-23: 1 via TOPICAL
  Administered 2014-02-24: 23:00:00 via TOPICAL
  Administered 2014-02-24: 1 via TOPICAL
  Administered 2014-02-25 (×2): via TOPICAL
  Administered 2014-02-26: 1 via TOPICAL
  Administered 2014-02-26 – 2014-03-05 (×11): via TOPICAL
  Filled 2014-02-20 (×2): qty 15

## 2014-02-20 MED ORDER — BACITRACIN 500 UNIT/GM EX OINT
1.0000 "application " | TOPICAL_OINTMENT | Freq: Two times a day (BID) | CUTANEOUS | Status: DC
  Filled 2014-02-20 (×2): qty 0.9

## 2014-02-20 MED ORDER — DOCUSATE SODIUM 50 MG/5ML PO LIQD
200.0000 mg | Freq: Two times a day (BID) | ORAL | Status: DC
  Administered 2014-02-20 – 2014-02-23 (×7): 200 mg
  Filled 2014-02-20 (×8): qty 20

## 2014-02-20 NOTE — Progress Notes (Signed)
Patient ID: [name] [name], male   DOB: 05-28-1950, 63 y.o.   MRN: [id number] BP 130/61  Pulse 82  Temp(Src) 98.6 F (37 C) (Core (Comment))  Resp 19  Ht 5\' 7"  (1.702 m)  Wt 106.6 kg (235 lb 0.2 oz)  BMI 36.80 kg/m2  SpO2 95% When [name] [name] is able to be up, his brace should be placed while in bed.

## 2014-02-20 NOTE — Progress Notes (Signed)
I have seen and examined the pt and agree with PA-Jeffery's progress note. DC CT Weaning trials

## 2014-02-20 NOTE — Progress Notes (Signed)
ETT advanced 2cm to 23 at lip.  RT will continue to monitor.

## 2014-02-20 NOTE — Progress Notes (Signed)
Patient ID: [name] [name], male   DOB: May 05, 1950, 63 y.o.   MRN: [id number]   LOS: 11 days   Subjective: Sedated, on vent.   Objective: Vital signs in last 24 hours: Temp:  [99 F (37.2 C)-102 F (38.9 C)] 100.2 F (37.9 C) (10/21 0600) Pulse Rate:  [62-91] 73 (10/21 0600) Resp:  [19-26] 20 (10/21 0311) BP: (87-132)/(39-75) 93/40 mmHg (10/21 0600) SpO2:  [92 %-98 %] 95 % (10/21 0748) FiO2 (%):  [40 %] 40 % (10/21 0748) Weight:  [235 lb 0.2 oz (106.6 kg)] 235 lb 0.2 oz (106.6 kg) (10/21 0442) Last BM Date:  (PTA)   VENT: PRVC/40%/RR20/PEEP5/Vt522ml   UOP: 68ml/h NET: +1240ml/24h TOTAL: +12.6L/admission   CT No air leak 162ml/24h  Laboratory CBC  Recent Labs  02/19/14 0545 02/20/14 0015  WBC 6.9 6.9  HGB 7.3* 7.0*  HCT 23.1* 22.2*  PLT 292 315   BMET  Recent Labs  02/19/14 0545 02/20/14 0015  NA 143 143  K 3.4* 3.8  CL 102 105  CO2 30 30  GLUCOSE 122* 122*  BUN 27* 30*  CREATININE 0.94 0.92  CALCIUM 8.3* 8.0*   CBG (last 3)   Recent Labs  02/19/14 2017 02/19/14 2328 02/20/14 0330  GLUCAP 104* 109* 133*    Radiology PORTABLE CHEST - 1 VIEW  COMPARISON: Portable chest x-ray of 02/19/2014  FINDINGS:  There is little change of in diffuse airspace disease with slightly  diminished aeration. The tip of the endotracheal tube is  approximately 6.5 cm above the carina. The right central venous line  tip is seen to the region of the SVC-RA junction. A right chest tube  is present and no definite pneumothorax is seen. There is vague  lucency peripherally in the right upper hemithorax and a tiny  pneumothorax on the right cannot be excluded. Cardiomegaly is  stable.  IMPRESSION:  1. Endotracheal tube tip 6.5 cm x 1.  2. Little change in diffuse airspace disease with slightly  diminished aeration.  3. No definite pneumothorax. See above.  Electronically Signed  By: Ivar Drape M.D.  On: 02/20/2014 08:02   Physical Exam General  appearance: no distress Resp: rhonchi bilaterally Cardio: regular rate and rhythm GI: Soft, +BS Extremities: Warm   Assessment/Plan: [facility]  Ventilator dependent respiratory failure - scheduled BDs, continue trying to wean  L forehead lac -- D/C staples B rib FXs, R PTX/B pulm contusions - D/C CT PEA arrest at scene - uncertain etiology, troponin and EKG unremarkable, possibly due to PTX  L trimal ankle FX - ORIF by Dr. Doran Durand, NWB  L1 endplate FX - TLSO per Dr. Christella Noa  ETOH abuse - CIWA, Precedex  ABL anemia - stable  FEN - Lasix again, decrease IVF VTE - Right SCD, Lovenox  Dispo - ICU   Critical care time: 0805 -- Pierre Part    Lisette Abu, PA-C Pager: [phone] General Trauma PA Pager: [phone]  02/20/2014
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
## 2014-02-21 ENCOUNTER — Inpatient Hospital Stay (HOSPITAL_COMMUNITY): Payer: No Typology Code available for payment source

## 2014-02-21 LAB — CBC
HCT: 24.1 % — ABNORMAL LOW (ref 39.0–52.0)
HEMOGLOBIN: 7.5 g/dL — AB (ref 13.0–17.0)
MCH: 30.7 pg (ref 26.0–34.0)
MCHC: 31.1 g/dL (ref 30.0–36.0)
MCV: 98.8 fL (ref 78.0–100.0)
Platelets: 350 10*3/uL (ref 150–400)
RBC: 2.44 MIL/uL — AB (ref 4.22–5.81)
RDW: 16.2 % — AB (ref 11.5–15.5)
WBC: 7.6 10*3/uL (ref 4.0–10.5)

## 2014-02-21 LAB — GLUCOSE, CAPILLARY
Glucose-Capillary: 92 mg/dL (ref 70–99)
Glucose-Capillary: 107 mg/dL — ABNORMAL HIGH (ref 70–99)
Glucose-Capillary: 126 mg/dL — ABNORMAL HIGH (ref 70–99)
Glucose-Capillary: 121 mg/dL — ABNORMAL HIGH (ref 70–99)
Glucose-Capillary: 109 mg/dL — ABNORMAL HIGH (ref 70–99)

## 2014-02-21 MED ORDER — IPRATROPIUM-ALBUTEROL 0.5-2.5 (3) MG/3ML IN SOLN
3.0000 mL | Freq: Three times a day (TID) | RESPIRATORY_TRACT | Status: DC
  Administered 2014-02-22 – 2014-02-24 (×7): 3 mL via RESPIRATORY_TRACT
  Filled 2014-02-21 (×7): qty 3

## 2014-02-21 NOTE — Progress Notes (Signed)
Follow up - Trauma and Critical Care  Patient Details:    [name] [name] is an 63 y.o. male.  Lines/tubes : Airway 7.5 mm (Active)  Secured at (cm) 23 cm 02/21/2014 11:23 AM  Measured From Lips 02/21/2014 11:23 AM  [place] 02/21/2014 11:23 AM  Secured By [employer] 02/21/2014 11:23 AM  Tube Holder Repositioned Yes 02/21/2014 11:23 AM  Cuff Pressure (cm H2O) 28 cm H2O 02/21/2014  3:32 AM  Site Condition Dry 02/21/2014 11:23 AM     PICC / Midline Double Lumen 16/10/96 PICC Right Basilic 40 cm 0 cm (Active)  Indication for Insertion or Continuance of Line Prolonged intravenous therapies 02/20/2014  8:00 PM  Exposed Catheter (cm) 0 cm 02/18/2014  7:50 AM  Site Assessment Clean;Dry;Intact 02/21/2014  8:00 AM  Lumen #1 Status Infusing 02/21/2014  8:00 AM  Lumen #2 Status Flushed;Saline locked 02/21/2014  8:00 AM  Dressing Type Transparent 02/21/2014  8:00 AM  Dressing Status Clean;Dry;Intact 02/21/2014  8:00 AM  Line Care Connections checked and tightened 02/21/2014  8:00 AM  Line Adjustment (NICU/IV Team Only) No 02/14/2014  2:47 PM  Dressing Intervention New dressing 02/14/2014  2:47 PM  Dressing Change Due 02/21/14 02/17/2014  8:00 PM     NG/OG Tube Orogastric 16 Fr. Right mouth (Active)  Placement Verification Auscultation 02/21/2014  8:00 AM  Site Assessment Clean;Dry;Intact 02/21/2014  8:00 AM  Status Infusing tube feed 02/21/2014  8:00 AM  Drainage Appearance Tan 02/20/2014  8:00 PM  Gastric Residual 0 mL 02/21/2014  8:00 AM  Intake (mL) 100 mL 02/19/2014  4:00 PM  Output (mL) 90 mL 02/19/2014  6:00 AM     Urethral Catheter [name], RN Temperature probe 16 Fr. (Active)  Indication for Insertion or Continuance of Catheter Other (comment) 02/21/2014  7:55 AM  Site Assessment Clean;Intact 02/21/2014  8:00 AM  Catheter Maintenance No dependent loops;Bag below level of bladder;Seal intact;Catheter secured;Bag emptied prior to transport;Drainage  bag/tubing not touching floor;Insertion date on drainage bag 02/21/2014  7:56 AM  Collection Container Standard drainage bag 02/21/2014  8:00 AM  Securement Method Leg strap 02/21/2014  8:00 AM  Urinary Catheter Interventions Unclamped 02/21/2014  8:00 AM  Output (mL) 125 mL 02/21/2014  6:00 AM    Microbiology/Sepsis markers: Results for orders placed during the hospital encounter of 02/09/14  MRSA PCR SCREENING     Status: None   Collection Time    02/09/14  6:02 PM      Result Value Ref Range Status   MRSA by PCR NEGATIVE  NEGATIVE Final   Comment:            The GeneXpert MRSA Assay (FDA     approved for NASAL specimens     only), is one component of a     comprehensive MRSA colonization     surveillance program. It is not     intended to diagnose MRSA     infection nor to guide or     monitor treatment for     MRSA infections.  CULTURE, BLOOD (ROUTINE X 2)     Status: None   Collection Time    02/14/14 12:40 AM      Result Value Ref Range Status   Specimen Description BLOOD LEFT WRIST   Final   Special Requests BOTTLES DRAWN AEROBIC AND ANAEROBIC 10CC   Final   Culture  Setup Time     Final   Value: 02/14/2014 08:41     Performed  at [facility]     Final   Value: NO GROWTH 5 DAYS     Performed at [facility]   Report Status 02/20/2014 FINAL   Final  CULTURE, BLOOD (ROUTINE X 2)     Status: None   Collection Time    02/14/14 12:50 AM      Result Value Ref Range Status   Specimen Description BLOOD LEFT ARM   Final   Special Requests BOTTLES DRAWN AEROBIC ONLY 10CC   Final   Culture  Setup Time     Final   Value: 02/14/2014 08:42     Performed at [facility]   Culture     Final   Value: NO GROWTH 5 DAYS     Performed at [facility]   Report Status 02/20/2014 FINAL   Final  CULTURE, RESPIRATORY (NON-EXPECTORATED)     Status: None   Collection Time    02/14/14 12:50 AM      Result Value Ref Range Status   Specimen  Description TRACHEAL ASPIRATE   Final   Special Requests NONE   Final   Gram Stain     Final   Value: ABUNDANT WBC PRESENT, PREDOMINANTLY PMN     NO SQUAMOUS EPITHELIAL CELLS SEEN     NO ORGANISMS SEEN     Performed at [facility]   Culture     Final   Value: Non-Pathogenic Oropharyngeal-type Flora Isolated.     Performed at [facility]   Report Status 02/16/2014 FINAL   Final  URINE CULTURE     Status: None   Collection Time    02/14/14  1:01 AM      Result Value Ref Range Status   Specimen Description URINE, CATHETERIZED   Final   Special Requests none   Final   Culture  Setup Time     Final   Value: 02/14/2014 08:34     Performed at [place]     Final   Value: NO GROWTH     Performed at [facility]   Culture     Final   Value: NO GROWTH     Performed at [facility]   Report Status 02/15/2014 FINAL   Final    Anti-infectives:  Anti-infectives   Start     Dose/Rate Route Frequency Ordered Stop   02/17/14 1000  vancomycin (VANCOCIN) IVPB 1000 mg/200 mL premix  Status:  Discontinued     1,000 mg 200 mL/hr over 60 Minutes Intravenous Every 8 hours 02/17/14 0242 02/19/14 1015   02/14/14 1200  vancomycin (VANCOCIN) IVPB 1000 mg/200 mL premix  Status:  Discontinued     1,000 mg 200 mL/hr over 60 Minutes Intravenous Every 12 hours 02/13/14 2332 02/17/14 0242   02/14/14 0000  levofloxacin (LEVAQUIN) IVPB 750 mg  Status:  Discontinued     750 mg 100 mL/hr over 90 Minutes Intravenous Every 24 hours 02/13/14 2332 02/19/14 1015   02/13/14 2345  vancomycin (VANCOCIN) 2,000 mg in sodium chloride 0.9 % 500 mL IVPB     2,000 mg 250 mL/hr over 120 Minutes Intravenous  Once 02/13/14 2332 02/14/14 0254   02/12/14 0600  ceFAZolin (ANCEF) IVPB 2 g/50 mL premix     2 g 100 mL/hr over 30 Minutes Intravenous On call to O.R. 02/11/14 1429 02/12/14 0554      Best Practice/Protocols:  VTE Prophylaxis: Mechanical GI Prophylaxis:  Proton Pump Inhibitor Continous Sedation  Consults: Treatment Team:  Ashok Pall, MD    Events:  Subjective:    Overnight Issues: Patient has not weaned well enough to extubate.    Objective:  Vital signs for last 24 hours: Temp:  [98.4 F (36.9 C)-100.8 F (38.2 C)] 99.9 F (37.7 C) (10/22 1100) Pulse Rate:  [60-93] 79 (10/22 1123) Resp:  [7-27] 20 (10/22 1123) BP: (87-139)/(43-97) 103/47 mmHg (10/22 1123) SpO2:  [92 %-100 %] 97 % (10/22 1123) FiO2 (%):  [30 %-50 %] 40 % (10/22 1123) Weight:  [97.8 kg (215 lb 9.8 oz)] 97.8 kg (215 lb 9.8 oz) (10/22 0426)  Hemodynamic parameters for last 24 hours:    Intake/Output from previous day: 10/21 0701 - 10/22 0700 In: 1277.5 [I.V.:557.5; NG/GT:720] Out: 3735 [Urine:3735]  Intake/Output this shift: Total I/O In: 220 [I.V.:100; NG/GT:120] Out: 1000 [Urine:1000]  Vent settings for last 24 hours: Vent Mode:  [-] CPAP;PSV FiO2 (%):  [30 %-50 %] 40 % Set Rate:  [20 bmp] 20 bmp Vt Set:  [550 mL] 550 mL PEEP:  [5 cmH20] 5 cmH20 Pressure Support:  [12 cmH20-14 cmH20] 12 cmH20 Plateau Pressure:  [22 cmH20-23 cmH20] 22 cmH20  Physical Exam:  General: no respiratory distress and not weaning well Neuro: nonfocal exam and RASS 0 Resp: clear to auscultation bilaterally GI: soft, nontender, BS WNL, no r/g Extremities: edema 2+ and most edema is in the upper extremities  Results for orders placed during the hospital encounter of 02/09/14 (from the past 24 hour(s))  GLUCOSE, CAPILLARY     Status: Abnormal   Collection Time    02/20/14 12:11 PM      Result Value Ref Range   Glucose-Capillary 124 (*) 70 - 99 mg/dL   Comment 1 Notify RN     Comment 2 Documented in Chart    GLUCOSE, CAPILLARY     Status: Abnormal   Collection Time    02/20/14  4:14 PM      Result Value Ref Range   Glucose-Capillary 123 (*) 70 - 99 mg/dL  GLUCOSE, CAPILLARY     Status: None   Collection Time    02/20/14  8:36 PM      Result Value Ref Range    Glucose-Capillary 99  70 - 99 mg/dL  GLUCOSE, CAPILLARY     Status: Abnormal   Collection Time    02/20/14 11:23 PM      Result Value Ref Range   Glucose-Capillary 111 (*) 70 - 99 mg/dL   Comment 1 Documented in Chart     Comment 2 Notify RN    GLUCOSE, CAPILLARY     Status: None   Collection Time    02/21/14  3:29 AM      Result Value Ref Range   Glucose-Capillary 92  70 - 99 mg/dL   Comment 1 Notify RN     Comment 2 Documented in Chart    CBC     Status: Abnormal   Collection Time    02/21/14  5:40 AM      Result Value Ref Range   WBC 7.6  4.0 - 10.5 K/uL   RBC 2.44 (*) 4.22 - 5.81 MIL/uL   Hemoglobin 7.5 (*) 13.0 - 17.0 g/dL   HCT 24.1 (*) 39.0 - 52.0 %   MCV 98.8  78.0 - 100.0 fL   MCH 30.7  26.0 - 34.0 pg   MCHC 31.1  30.0 - 36.0 g/dL   RDW 16.2 (*)  11.5 - 15.5 %   Platelets 350  150 - 400 K/uL  GLUCOSE, CAPILLARY     Status: Abnormal   Collection Time    02/21/14  8:09 AM      Result Value Ref Range   Glucose-Capillary 107 (*) 70 - 99 mg/dL   Comment 1 Notify RN     Comment 2 Documented in Chart       Assessment/Plan:   NEURO  Altered Mental Status:  sedation   Plan: Wean sedation with ventilator  PULM  Atelectasis/collapse (focal) and Interstitial Lung Disease: Interstitial edema is present   Plan: Would like to diureses but BUN/creatinine starting to increase  CARDIO  No issues   Plan: CPM  RENAL  Urine output is good   Plan: CPM  GI  No problems   Plan: CPM with tube feedings  ID  No known infectious sources   Plan: CPM  HEME  Anemia anemia of critical illness)   Plan: No blood for now.  ENDO No concerns   Plan: CPM  Global Issues  Patient is very slow to wean from the ventilator.  May need trahc in the future.    LOS: 12 days   Additional comments:I reviewed the patient's new clinical lab test results. cbc/bmet and I reviewed the patients new imaging test results. cxr  Critical Care Total Time*: 45 Minutes  Neera Teng,  JAY 02/21/2014  *Care during the described time interval was provided by me and/or other providers on the critical care team.  I have reviewed this patient's available data, including medical history, events of note, physical examination and test results as part of my evaluation.

## 2014-02-22 LAB — GLUCOSE, CAPILLARY
GLUCOSE-CAPILLARY: 138 mg/dL — AB (ref 70–99)
GLUCOSE-CAPILLARY: 148 mg/dL — AB (ref 70–99)
Glucose-Capillary: 107 mg/dL — ABNORMAL HIGH (ref 70–99)
Glucose-Capillary: 113 mg/dL — ABNORMAL HIGH (ref 70–99)
Glucose-Capillary: 141 mg/dL — ABNORMAL HIGH (ref 70–99)
Glucose-Capillary: 150 mg/dL — ABNORMAL HIGH (ref 70–99)
Glucose-Capillary: 97 mg/dL (ref 70–99)

## 2014-02-22 NOTE — Progress Notes (Signed)
Attempted SBT, pt did not tol 5 ps d/t low minute volume and low tidal volumes.  PS increased to 12, pt tol well so far.  No resp distress noted, sat 94-97%.

## 2014-02-22 NOTE — Progress Notes (Signed)
UR completed.  Sheikh Leverich, RN BSN M[facility] CCM Trauma/Neuro ICU Case Manager [phone]  

## 2014-02-22 NOTE — Progress Notes (Signed)
Follow up - Trauma and Critical Care  Patient Details:    [name] [name] is an 63 y.o. male.  Lines/tubes : Airway 7.5 mm (Active)  Secured at (cm) 23 cm 02/22/2014  8:19 AM  Measured From Lips 02/22/2014  8:19 AM  Secured Location Left 02/22/2014  8:19 AM  Secured By [employer] 02/22/2014  8:19 AM  Tube Holder Repositioned Yes 02/22/2014  8:19 AM  Cuff Pressure (cm H2O) 28 cm H2O 02/21/2014  7:54 PM  Site Condition Dry 02/22/2014  8:19 AM     PICC / Midline Double Lumen 94/17/40 PICC Right Basilic 40 cm 0 cm (Active)  Indication for Insertion or Continuance of Line Prolonged intravenous therapies 02/22/2014  8:00 AM  Exposed Catheter (cm) 0 cm 02/18/2014  7:50 AM  Site Assessment Clean;Dry;Intact 02/22/2014  8:00 AM  Lumen #1 Status Infusing 02/22/2014  8:00 AM  Lumen #2 Status Flushed;Saline locked 02/22/2014  8:00 AM  Dressing Type Transparent 02/22/2014  8:00 AM  Dressing Status Clean;Dry;Intact 02/22/2014  8:00 AM  Line Care Connections checked and tightened 02/22/2014  8:00 AM  Line Adjustment (NICU/IV Team Only) No 02/14/2014  2:47 PM  Dressing Intervention New dressing;Dressing changed;Antimicrobial disc changed 02/21/2014 12:00 PM  Dressing Change Due 02/28/14 02/21/2014  8:00 PM     NG/OG Tube Orogastric 16 Fr. Right mouth (Active)  Placement Verification Auscultation 02/22/2014  8:00 AM  Site Assessment Clean;Dry;Intact 02/22/2014  8:00 AM  Status Infusing tube feed 02/22/2014  8:00 AM  Drainage Appearance Tan 02/22/2014  4:00 AM  Gastric Residual 0 mL 02/22/2014  8:00 AM  Intake (mL) 60 mL 02/22/2014  4:00 AM  Output (mL) 90 mL 02/19/2014  6:00 AM     Urethral Catheter [name], RN Temperature probe 16 Fr. (Active)  Indication for Insertion or Continuance of Catheter Aggressive IV diuresis 02/22/2014  8:00 AM  Site Assessment Intact;Clean 02/22/2014  8:00 AM  Catheter Maintenance Bag below level of bladder;No dependent loops;Seal intact;Bag emptied  prior to transport;Drainage bag/tubing not touching floor;Catheter secured;Insertion date on drainage bag 02/22/2014  8:00 AM  Collection Container Standard drainage bag 02/22/2014  8:00 AM  Securement Method Leg strap 02/22/2014  8:00 AM  Urinary Catheter Interventions Unclamped 02/22/2014  8:00 AM  Output (mL) 125 mL 02/21/2014  6:00 AM    Microbiology/Sepsis markers: Results for orders placed during the hospital encounter of 02/09/14  MRSA PCR SCREENING     Status: None   Collection Time    02/09/14  6:02 PM      Result Value Ref Range Status   MRSA by PCR NEGATIVE  NEGATIVE Final   Comment:            The GeneXpert MRSA Assay (FDA     approved for NASAL specimens     only), is one component of a     comprehensive MRSA colonization     surveillance program. It is not     intended to diagnose MRSA     infection nor to guide or     monitor treatment for     MRSA infections.  CULTURE, BLOOD (ROUTINE X 2)     Status: None   Collection Time    02/14/14 12:40 AM      Result Value Ref Range Status   Specimen Description BLOOD LEFT WRIST   Final   Special Requests BOTTLES DRAWN AEROBIC AND ANAEROBIC 10CC   Final   Culture  Setup Time     Final  Value: 02/14/2014 08:41     Performed at [facility]   Culture     Final   Value: NO GROWTH 5 DAYS     Performed at [facility]   Report Status 02/20/2014 FINAL   Final  CULTURE, BLOOD (ROUTINE X 2)     Status: None   Collection Time    02/14/14 12:50 AM      Result Value Ref Range Status   Specimen Description BLOOD LEFT ARM   Final   Special Requests BOTTLES DRAWN AEROBIC ONLY 10CC   Final   Culture  Setup Time     Final   Value: 02/14/2014 08:42     Performed at [facility]   Culture     Final   Value: NO GROWTH 5 DAYS     Performed at [facility]   Report Status 02/20/2014 FINAL   Final  CULTURE, RESPIRATORY (NON-EXPECTORATED)     Status: None   Collection Time    02/14/14 12:50 AM       Result Value Ref Range Status   Specimen Description TRACHEAL ASPIRATE   Final   Special Requests NONE   Final   Gram Stain     Final   Value: ABUNDANT WBC PRESENT, PREDOMINANTLY PMN     NO SQUAMOUS EPITHELIAL CELLS SEEN     NO ORGANISMS SEEN     Performed at [facility]   Culture     Final   Value: Non-Pathogenic Oropharyngeal-type Flora Isolated.     Performed at [facility]   Report Status 02/16/2014 FINAL   Final  URINE CULTURE     Status: None   Collection Time    02/14/14  1:01 AM      Result Value Ref Range Status   Specimen Description URINE, CATHETERIZED   Final   Special Requests none   Final   Culture  Setup Time     Final   Value: 02/14/2014 08:34     Performed at [facility]     Final   Value: NO GROWTH     Performed at [facility]   Culture     Final   Value: NO GROWTH     Performed at [facility]   Report Status 02/15/2014 FINAL   Final    Anti-infectives:  Anti-infectives   Start     Dose/Rate Route Frequency Ordered Stop   02/17/14 1000  vancomycin (VANCOCIN) IVPB 1000 mg/200 mL premix  Status:  Discontinued     1,000 mg 200 mL/hr over 60 Minutes Intravenous Every 8 hours 02/17/14 0242 02/19/14 1015   02/14/14 1200  vancomycin (VANCOCIN) IVPB 1000 mg/200 mL premix  Status:  Discontinued     1,000 mg 200 mL/hr over 60 Minutes Intravenous Every 12 hours 02/13/14 2332 02/17/14 0242   02/14/14 0000  levofloxacin (LEVAQUIN) IVPB 750 mg  Status:  Discontinued     750 mg 100 mL/hr over 90 Minutes Intravenous Every 24 hours 02/13/14 2332 02/19/14 1015   02/13/14 2345  vancomycin (VANCOCIN) 2,000 mg in sodium chloride 0.9 % 500 mL IVPB     2,000 mg 250 mL/hr over 120 Minutes Intravenous  Once 02/13/14 2332 02/14/14 0254   02/12/14 0600  ceFAZolin (ANCEF) IVPB 2 g/50 mL premix     2 g 100 mL/hr over 30 Minutes Intravenous On call to O.R. 02/11/14 1429 02/12/14 0554      Best  Practice/Protocols:  VTE  Prophylaxis: Mechanical GI Prophylaxis: Proton Pump Inhibitor Continous Sedation fentanyl only.  Consults: Treatment Team:  Ashok Pall, MD    Events:  Subjective:    Overnight Issues: Weaned for a long time yesterday.  Problem is that he has a lot of secretions and a poor cough.  Objective:  Vital signs for last 24 hours: Temp:  [97.7 F (36.5 C)-101.5 F (38.6 C)] 99.3 F (37.4 C) (10/23 0800) Pulse Rate:  [59-90] 68 (10/23 0819) Resp:  [17-28] 18 (10/23 0819) BP: (87-135)/(44-72) 135/64 mmHg (10/23 0819) SpO2:  [93 %-99 %] 94 % (10/23 0819) FiO2 (%):  [40 %] 40 % (10/23 0819) Weight:  [104.3 kg (229 lb 15 oz)] 104.3 kg (229 lb 15 oz) (10/23 0400)  Hemodynamic parameters for last 24 hours:    Intake/Output from previous day: 10/22 0701 - 10/23 0700 In: 1530 [I.V.:560; NG/GT:970] Out: 2510 [Urine:2510]  Intake/Output this shift: Total I/O In: 110 [I.V.:50; NG/GT:60] Out: -   Vent settings for last 24 hours: Vent Mode:  [-] PSV;CPAP FiO2 (%):  [40 %] 40 % Set Rate:  [20 bmp] 20 bmp Vt Set:  [550 mL] 550 mL PEEP:  [5 cmH20] 5 cmH20 Pressure Support:  [12 cmH20] 12 cmH20 Plateau Pressure:  [21 JQB34-19 cmH20] 24 cmH20  Physical Exam:  General: alert and no respiratory distress Neuro: alert, nonfocal exam and RASS 0 Resp: clear to auscultation bilaterally and but a lot of secretions. GI: soft, nontender, BS WNL, no r/g and tolerating tube feedings well. Extremities: edema 1+ and palpable pulses bilaterally  Results for orders placed during the hospital encounter of 02/09/14 (from the past 24 hour(s))  GLUCOSE, CAPILLARY     Status: Abnormal   Collection Time    02/21/14 12:12 PM      Result Value Ref Range   Glucose-Capillary 126 (*) 70 - 99 mg/dL   Comment 1 Notify RN     Comment 2 Documented in Chart    GLUCOSE, CAPILLARY     Status: Abnormal   Collection Time    02/21/14  3:19 PM      Result Value Ref Range   Glucose-Capillary 121 (*) 70 - 99  mg/dL  GLUCOSE, CAPILLARY     Status: Abnormal   Collection Time    02/21/14  7:38 PM      Result Value Ref Range   Glucose-Capillary 109 (*) 70 - 99 mg/dL  GLUCOSE, CAPILLARY     Status: Abnormal   Collection Time    02/22/14 12:01 AM      Result Value Ref Range   Glucose-Capillary 150 (*) 70 - 99 mg/dL  GLUCOSE, CAPILLARY     Status: Abnormal   Collection Time    02/22/14  4:06 AM      Result Value Ref Range   Glucose-Capillary 138 (*) 70 - 99 mg/dL  GLUCOSE, CAPILLARY     Status: None   Collection Time    02/22/14  8:54 AM      Result Value Ref Range   Glucose-Capillary 97  70 - 99 mg/dL   Comment 1 Notify RN     Comment 2 Documented in Chart       Assessment/Plan:   NEURO  Altered Mental Status:  agitation and sedation   Plan: Agitation is better.  PULM  Atelectasis/collapse (focal)   Plan: Continue to consider weaning from the ventilator  CARDIO  No issues   Plan: CPM  RENAL  Urine output is  good   Plan: CPM  GI  No issues, tolerating tube feedings well.   Plan: CPM  ID  No known infectious sources   Plan: CPM  HEME  Anemia anemia of critical illness)   Plan: Will recheck labs tomorrow.  ENDO No specific issues   Plan: CPM  Global Issues  Patient now appears to be calm enough to wean to extubate, but secretions are too much I believe.  Will talk with respiratory therapist about moving forward towards extubation.    LOS: 13 days   Additional comments:I reviewed the patient's new clinical lab test results. cbc/bmet and I reviewed the patients new imaging test results. cxr all from yesterday  Critical Care Total Time*: 30 Minutes  Sherrika Weakland, JAY 02/22/2014  *Care during the described time interval was provided by me and/or other providers on the critical care team.  I have reviewed this patient's available data, including medical history, events of note, physical examination and test results as part of my evaluation.

## 2014-02-23 LAB — GLUCOSE, CAPILLARY
GLUCOSE-CAPILLARY: 119 mg/dL — AB (ref 70–99)
GLUCOSE-CAPILLARY: 124 mg/dL — AB (ref 70–99)
Glucose-Capillary: 104 mg/dL — ABNORMAL HIGH (ref 70–99)
Glucose-Capillary: 111 mg/dL — ABNORMAL HIGH (ref 70–99)
Glucose-Capillary: 95 mg/dL (ref 70–99)

## 2014-02-23 MED ORDER — MORPHINE SULFATE 2 MG/ML IJ SOLN
2.0000 mg | INTRAMUSCULAR | Status: DC | PRN
  Administered 2014-02-23 – 2014-03-04 (×31): 2 mg via INTRAVENOUS
  Filled 2014-02-23 (×31): qty 1

## 2014-02-23 MED ORDER — LORAZEPAM 2 MG/ML IJ SOLN
1.0000 mg | INTRAMUSCULAR | Status: DC | PRN
  Administered 2014-02-23 – 2014-02-27 (×7): 1 mg via INTRAVENOUS
  Filled 2014-02-23 (×7): qty 1

## 2014-02-23 NOTE — Progress Notes (Signed)
Pt hypertensive ([phone]) systolic and restless but cooperative. Trauma notified. Orders received.

## 2014-02-23 NOTE — Progress Notes (Signed)
SLP Cancellation Note  Patient Details Name: [name] [name] MRN: [id number] DOB: [birth year]   Cancelled treatment:        Orders received for BSE. Pt currently on vent, with possible extubation today. ST to continue to follow for appropriateness to complete swallow assessment.  Celia B. Kaka, [facility], [place]  Shonna Chock 02/23/2014, 11:28 AM

## 2014-02-23 NOTE — Progress Notes (Signed)
25 ML fentanyl wasted in sink with Robina Ade.

## 2014-02-23 NOTE — Progress Notes (Signed)
02/23/2014- Respiratory care note- Pt suctioned and extubated at 1030 as per MD order.  Pulmonary mechanics done prior to extubation.  FVC 700, vt 350, NIF -30, rr 22.  Pt placed on 5lpm nasal cannula post extubation.  HR 79, rr20, sats 93% on 5lpm.  Ventilator and ambu bag at bedside.  Pt with a good cough post extubation and able to follow instruction on use of yaunkeer device.  Will monitor progress and wean oxygen as tolerated. s Dema Timmons rrt. rcp

## 2014-02-23 NOTE — Progress Notes (Signed)
Patient ID: [name] [name], male   DOB: 1950-10-24, 63 y.o.   MRN: [id number] BP 127/60  Pulse 68  Temp(Src) 100.4 F (38 C) (Core (Comment))  Resp 20  Ht 5\' 7"  (1.702 m)  Wt 103.6 kg (228 lb 6.3 oz)  BMI 35.76 kg/m2  SpO2 96% Will follow commands No new recs Brace in place

## 2014-02-23 NOTE — Progress Notes (Signed)
Seen, agree with above. Possible extubation today.

## 2014-02-23 NOTE — Progress Notes (Signed)
Patient ID: [name] [name], male   DOB: [birth year], 63 y.o.   MRN: [id number]   LOS: 14 days   Subjective: Arouses easily, +FC and answers yes/no questions. On vent.   Objective: Vital signs in last 24 hours: Temp:  [98.8 F (37.1 C)-100.6 F (38.1 C)] 100.6 F (38.1 C) (10/24 0900) Pulse Rate:  [63-92] 67 (10/24 0900) Resp:  [13-26] 20 (10/24 0900) BP: (97-142)/(47-100) 124/63 mmHg (10/24 0900) SpO2:  [94 %-100 %] 96 % (10/24 0900) FiO2 (%):  [40 %] 40 % (10/24 0838) Weight:  [228 lb 6.3 oz (103.6 kg)] 228 lb 6.3 oz (103.6 kg) (10/24 0500) Last BM Date: 02/21/14   VENT: PS/CPAP 5/12  UOP: 77ml/h NET: -884ml/24h TOTAL: +8428ml/admission   Laboratory CBG (last 3)   Recent Labs  02/22/14 2327 02/23/14 0332 02/23/14 0755  GLUCAP 141* 119* 124*    Physical Exam General appearance: alert and no distress Resp: clear to auscultation bilaterally Cardio: regular rate and rhythm GI: normal findings: bowel sounds normal and soft, non-tender Extremities: NVI   Assessment/Plan: MCC  Ventilator dependent respiratory failure - scheduled BDs, will attempt extubation today L forehead lac -- Local care B rib FXs, R PTX/B pulm contusions  PEA arrest at scene - uncertain etiology, troponin and EKG unremarkable, possibly due to PTX  L trimal ankle FX - ORIF by Dr. Doran Durand, NWB  L1 endplate FX - TLSO per Dr. Christella Noa  ETOH abuse - CIWA, Precedex  ABL anemia - stable  FEN - No issues VTE - Right SCD, Lovenox  Dispo - ICU    Lisette Abu, PA-C Pager: [phone] General Trauma PA Pager: [phone]  02/23/2014

## 2014-02-24 ENCOUNTER — Inpatient Hospital Stay (HOSPITAL_COMMUNITY): Payer: No Typology Code available for payment source

## 2014-02-24 DIAGNOSIS — R410 Disorientation, unspecified: Secondary | ICD-10-CM | POA: Diagnosis not present

## 2014-02-24 LAB — BLOOD GAS, ARTERIAL
ACID-BASE EXCESS: 3.3 mmol/L — AB (ref 0.0–2.0)
Bicarbonate: 27.8 mEq/L — ABNORMAL HIGH (ref 20.0–24.0)
Drawn by: 369891
FIO2: 0.5 %
O2 Saturation: 92.8 %
PATIENT TEMPERATURE: 98.6
TCO2: 29.3 mmol/L (ref 0–100)
pCO2 arterial: 46.2 mmHg — ABNORMAL HIGH (ref 35.0–45.0)
pH, Arterial: 7.398 (ref 7.350–7.450)
pO2, Arterial: 70.4 mmHg — ABNORMAL LOW (ref 80.0–100.0)

## 2014-02-24 LAB — GLUCOSE, CAPILLARY
GLUCOSE-CAPILLARY: 101 mg/dL — AB (ref 70–99)
GLUCOSE-CAPILLARY: 113 mg/dL — AB (ref 70–99)
Glucose-Capillary: 111 mg/dL — ABNORMAL HIGH (ref 70–99)
Glucose-Capillary: 114 mg/dL — ABNORMAL HIGH (ref 70–99)
Glucose-Capillary: 114 mg/dL — ABNORMAL HIGH (ref 70–99)
Glucose-Capillary: 87 mg/dL (ref 70–99)

## 2014-02-24 LAB — BASIC METABOLIC PANEL
Anion gap: 11 (ref 5–15)
BUN: 21 mg/dL (ref 6–23)
CHLORIDE: 105 meq/L (ref 96–112)
CO2: 28 mEq/L (ref 19–32)
Calcium: 8.4 mg/dL (ref 8.4–10.5)
Creatinine, Ser: 0.73 mg/dL (ref 0.50–1.35)
GFR calc non Af Amer: >90 mL/min (ref >90)
Glucose, Bld: 114 mg/dL — ABNORMAL HIGH (ref 70–99)
POTASSIUM: 4.2 meq/L (ref 3.7–5.3)
Sodium: 144 mEq/L (ref 137–147)

## 2014-02-24 LAB — CBC
HEMATOCRIT: 29 % — AB (ref 39.0–52.0)
Hemoglobin: 9.1 g/dL — ABNORMAL LOW (ref 13.0–17.0)
MCH: 30.7 pg (ref 26.0–34.0)
MCHC: 31.4 g/dL (ref 30.0–36.0)
MCV: 98 fL (ref 78.0–100.0)
PLATELETS: 478 10*3/uL — AB (ref 150–400)
RBC: 2.96 MIL/uL — ABNORMAL LOW (ref 4.22–5.81)
RDW: 15.1 % (ref 11.5–15.5)
WBC: 10.5 10*3/uL (ref 4.0–10.5)

## 2014-02-24 MED ORDER — IPRATROPIUM-ALBUTEROL 0.5-2.5 (3) MG/3ML IN SOLN
3.0000 mL | RESPIRATORY_TRACT | Status: DC
  Administered 2014-02-24 – 2014-02-26 (×16): 3 mL via RESPIRATORY_TRACT
  Filled 2014-02-24 (×16): qty 3

## 2014-02-24 MED ORDER — FUROSEMIDE 20 MG PO TABS
20.0000 mg | ORAL_TABLET | Freq: Every day | ORAL | Status: DC
  Administered 2014-02-26 – 2014-03-05 (×6): 20 mg via ORAL
  Filled 2014-02-24 (×10): qty 1

## 2014-02-24 MED ORDER — HALOPERIDOL LACTATE 5 MG/ML IJ SOLN
2.0000 mg | Freq: Once | INTRAMUSCULAR | Status: AC
  Administered 2014-02-24: 2 mg via INTRAVENOUS

## 2014-02-24 MED ORDER — ATENOLOL 12.5 MG HALF TABLET
12.5000 mg | ORAL_TABLET | Freq: Every day | ORAL | Status: DC
  Filled 2014-02-24 (×2): qty 1

## 2014-02-24 MED ORDER — ALTEPLASE 2 MG IJ SOLR
2.0000 mg | Freq: Once | INTRAMUSCULAR | Status: AC
  Administered 2014-02-24: 2 mg
  Filled 2014-02-24: qty 2

## 2014-02-24 MED ORDER — HALOPERIDOL LACTATE 5 MG/ML IJ SOLN
INTRAMUSCULAR | Status: AC
  Filled 2014-02-24: qty 1

## 2014-02-24 MED ORDER — HALOPERIDOL LACTATE 5 MG/ML IJ SOLN
5.0000 mg | Freq: Once | INTRAMUSCULAR | Status: AC
  Administered 2014-02-24: 5 mg via INTRAVENOUS
  Filled 2014-02-24: qty 1

## 2014-02-24 NOTE — Progress Notes (Signed)
PT Cancellation Note  Patient Details Name: [name] [name] MRN: [id number] DOB: 1951-02-23   Cancelled Treatment:    Reason Eval/Treat Not Completed: Other (comment);Medical issues which prohibited therapy. RN requesting hold due to respiratory issues. Will re-attempt at next available time.    Elie Confer [place], [place] 02/24/2014, 7:50 AM

## 2014-02-24 NOTE — Progress Notes (Signed)
ABG    Component Value Date/Time   PHART 7.398 02/24/2014 0239   PCO2ART 46.2* 02/24/2014 0239   PO2ART 70.4* 02/24/2014 0239   HCO3 27.8* 02/24/2014 0239   TCO2 29.3 02/24/2014 0239   ACIDBASEDEF 3.4* 02/12/2014 0500   O2SAT 92.8 02/24/2014 0239   Pt on VM.

## 2014-02-24 NOTE — Progress Notes (Signed)
Getting a bath right now.  Alert, nad Oriented x 3 Cta b/l anteriorly Soft, nt, nd  Very HTN yesterday. Will resume some of his home BP meds O/w see PA note  [name] [name]. [name] Pulling, MD, FACS General, Bariatric, & Minimally Invasive Surgery [facility], [place]
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
## 2014-02-24 NOTE — Progress Notes (Signed)
Patient ID: [name] [name], male   DOB: [birth year], 63 y.o.   MRN: [id number] BP 164/77  Pulse 85  Temp(Src) 98.6 F (37 C) (Oral)  Resp 25  Ht 5\' 7"  (1.702 m)  Wt 88.1 kg (194 lb 3.6 oz)  BMI 30.41 kg/m2  SpO2 94% Alert and following commands Moving lower extremities Brace on , no changes.  No new recommendations

## 2014-02-25 ENCOUNTER — Inpatient Hospital Stay (HOSPITAL_COMMUNITY): Payer: No Typology Code available for payment source

## 2014-02-25 LAB — GLUCOSE, CAPILLARY
GLUCOSE-CAPILLARY: 95 mg/dL (ref 70–99)
GLUCOSE-CAPILLARY: 114 mg/dL — AB (ref 70–99)
GLUCOSE-CAPILLARY: 101 mg/dL — AB (ref 70–99)
Glucose-Capillary: 105 mg/dL — ABNORMAL HIGH (ref 70–99)
Glucose-Capillary: 117 mg/dL — ABNORMAL HIGH (ref 70–99)
Glucose-Capillary: 90 mg/dL (ref 70–99)
Glucose-Capillary: 113 mg/dL — ABNORMAL HIGH (ref 70–99)

## 2014-02-25 LAB — BASIC METABOLIC PANEL
ANION GAP: 13 (ref 5–15)
BUN: 15 mg/dL (ref 6–23)
CALCIUM: 8.5 mg/dL (ref 8.4–10.5)
CHLORIDE: 103 meq/L (ref 96–112)
CO2: 25 meq/L (ref 19–32)
Creatinine, Ser: 0.67 mg/dL (ref 0.50–1.35)
GFR calc Af Amer: >90 mL/min (ref >90)
GFR calc non Af Amer: >90 mL/min (ref >90)
Glucose, Bld: 115 mg/dL — ABNORMAL HIGH (ref 70–99)
Potassium: 3.8 mEq/L (ref 3.7–5.3)
Sodium: 141 mEq/L (ref 137–147)

## 2014-02-25 MED ORDER — METOPROLOL TARTRATE 1 MG/ML IV SOLN
5.0000 mg | Freq: Four times a day (QID) | INTRAVENOUS | Status: DC
  Administered 2014-02-25 – 2014-02-26 (×4): 5 mg via INTRAVENOUS
  Filled 2014-02-25 (×8): qty 5

## 2014-02-25 MED ORDER — FUROSEMIDE 10 MG/ML IJ SOLN
INTRAMUSCULAR | Status: AC
  Filled 2014-02-25: qty 4

## 2014-02-25 MED ORDER — HYDRALAZINE HCL 20 MG/ML IJ SOLN
10.0000 mg | Freq: Four times a day (QID) | INTRAMUSCULAR | Status: DC | PRN
  Administered 2014-02-25 – 2014-02-26 (×4): 10 mg via INTRAVENOUS
  Filled 2014-02-25 (×4): qty 1

## 2014-02-25 MED ORDER — FUROSEMIDE 10 MG/ML IJ SOLN
10.0000 mg | Freq: Once | INTRAMUSCULAR | Status: AC
  Administered 2014-02-25: 10 mg via INTRAVENOUS
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
## 2014-02-25 NOTE — Progress Notes (Signed)
Patient ID: [name] [name], male   DOB: 10/04/1950, 63 y.o.   MRN: [id number] BP 163/82  Pulse 69  Temp(Src) 99.1 F (37.3 C) (Oral)  Resp 32  Ht 5\' 7"  (1.702 m)  Wt 92.8 kg (204 lb 9.4 oz)  BMI 32.04 kg/m2  SpO2 91% Alert and oriented x 4 Moving extremities Extubated, breathing comfortably Stable exam
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
## 2014-02-25 NOTE — Plan of Care (Signed)
Problem: Phase I Progression Outcomes Goal: Patient tolerating nututrition at goal Outcome: Progressing Failed speech swallow, NG placed for further nutritional needs.

## 2014-02-26 ENCOUNTER — Inpatient Hospital Stay (HOSPITAL_COMMUNITY): Payer: No Typology Code available for payment source

## 2014-02-26 DIAGNOSIS — S32009S Unspecified fracture of unspecified lumbar vertebra, sequela: Secondary | ICD-10-CM

## 2014-02-26 DIAGNOSIS — S272XXS Traumatic hemopneumothorax, sequela: Secondary | ICD-10-CM

## 2014-02-26 DIAGNOSIS — S069X4A Unspecified intracranial injury with loss of consciousness of 6 hours to 24 hours, initial encounter: Secondary | ICD-10-CM

## 2014-02-26 LAB — GLUCOSE, CAPILLARY
GLUCOSE-CAPILLARY: 123 mg/dL — AB (ref 70–99)
Glucose-Capillary: 126 mg/dL — ABNORMAL HIGH (ref 70–99)
Glucose-Capillary: 114 mg/dL — ABNORMAL HIGH (ref 70–99)
Glucose-Capillary: 130 mg/dL — ABNORMAL HIGH (ref 70–99)
Glucose-Capillary: 97 mg/dL (ref 70–99)
Glucose-Capillary: 117 mg/dL — ABNORMAL HIGH (ref 70–99)

## 2014-02-26 MED ORDER — JEVITY 1.2 CAL PO LIQD
1000.0000 mL | ORAL | Status: DC
  Filled 2014-02-26 (×3): qty 1000

## 2014-02-26 MED ORDER — PRO-STAT SUGAR FREE PO LIQD
30.0000 mL | Freq: Three times a day (TID) | ORAL | Status: DC
  Administered 2014-02-26 – 2014-02-27 (×3): 30 mL
  Filled 2014-02-26 (×5): qty 30

## 2014-02-26 MED ORDER — JEVITY 1.2 CAL PO LIQD
1000.0000 mL | ORAL | Status: DC
  Administered 2014-02-26: 1000 mL
  Filled 2014-02-26 (×3): qty 1000

## 2014-02-26 MED ORDER — METOPROLOL TARTRATE 25 MG/10 ML ORAL SUSPENSION
12.5000 mg | Freq: Two times a day (BID) | ORAL | Status: DC
  Administered 2014-02-26 – 2014-03-05 (×12): 12.5 mg
  Filled 2014-02-26 (×17): qty 5
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
## 2014-02-26 NOTE — Progress Notes (Signed)
Rehab Admissions Coordinator Note:  Patient was screened by Cleatrice Burke for appropriateness for an Inpatient Acute Rehab Consult.  At this time, we are recommending Inpatient Rehab consult.  Cleatrice Burke 02/26/2014, 7:43 AM  I can be reached at [phone].

## 2014-02-27 ENCOUNTER — Inpatient Hospital Stay (HOSPITAL_COMMUNITY): Payer: No Typology Code available for payment source

## 2014-02-27 ENCOUNTER — Inpatient Hospital Stay (HOSPITAL_COMMUNITY): Payer: No Typology Code available for payment source | Admitting: Certified Registered Nurse Anesthetist

## 2014-02-27 ENCOUNTER — Encounter (HOSPITAL_COMMUNITY): Payer: No Typology Code available for payment source | Admitting: Certified Registered Nurse Anesthetist

## 2014-02-27 ENCOUNTER — Encounter (HOSPITAL_COMMUNITY): Payer: Self-pay | Admitting: Radiology

## 2014-02-27 LAB — CBC
HCT: 31.5 % — ABNORMAL LOW (ref 39.0–52.0)
Hemoglobin: 9.9 g/dL — ABNORMAL LOW (ref 13.0–17.0)
MCH: 30.2 pg (ref 26.0–34.0)
MCHC: 31.4 g/dL (ref 30.0–36.0)
MCV: 96 fL (ref 78.0–100.0)
Platelets: 509 10*3/uL — ABNORMAL HIGH (ref 150–400)
RBC: 3.28 MIL/uL — ABNORMAL LOW (ref 4.22–5.81)
RDW: 14.9 % (ref 11.5–15.5)
WBC: 9.3 10*3/uL (ref 4.0–10.5)

## 2014-02-27 LAB — BLOOD GAS, ARTERIAL
Acid-Base Excess: 0.2 mmol/L (ref 0.0–2.0)
Acid-base deficit: 0.5 mmol/L (ref 0.0–2.0)
BICARBONATE: 26.8 meq/L — AB (ref 20.0–24.0)
Bicarbonate: 24.7 mEq/L — ABNORMAL HIGH (ref 20.0–24.0)
DRAWN BY: 249101
Drawn by: 331761
FIO2: 1 %
FIO2: 0.6 %
LHR: 14 {breaths}/min
O2 SAT: 98.8 %
O2 SAT: 98.7 %
PATIENT TEMPERATURE: 99
PATIENT TEMPERATURE: 98.6
PEEP/CPAP: 5 cmH2O
PH ART: 7.191 — AB (ref 7.350–7.450)
PH ART: 7.377 (ref 7.350–7.450)
TCO2: 29 mmol/L (ref 0–100)
TCO2: 26 mmol/L (ref 0–100)
VT: 530 mL
pCO2 arterial: 73.1 mmHg (ref 35.0–45.0)
pCO2 arterial: 43 mmHg (ref 35.0–45.0)
pO2, Arterial: 172 mmHg — ABNORMAL HIGH (ref 80.0–100.0)
pO2, Arterial: 131 mmHg — ABNORMAL HIGH (ref 80.0–100.0)

## 2014-02-27 LAB — GLUCOSE, CAPILLARY
GLUCOSE-CAPILLARY: 133 mg/dL — AB (ref 70–99)
GLUCOSE-CAPILLARY: 87 mg/dL (ref 70–99)
GLUCOSE-CAPILLARY: 114 mg/dL — AB (ref 70–99)
Glucose-Capillary: 116 mg/dL — ABNORMAL HIGH (ref 70–99)
Glucose-Capillary: 119 mg/dL — ABNORMAL HIGH (ref 70–99)
Glucose-Capillary: 145 mg/dL — ABNORMAL HIGH (ref 70–99)

## 2014-02-27 LAB — BASIC METABOLIC PANEL
ANION GAP: 11 (ref 5–15)
BUN: 16 mg/dL (ref 6–23)
CALCIUM: 8.2 mg/dL — AB (ref 8.4–10.5)
CO2: 26 mEq/L (ref 19–32)
CREATININE: 0.69 mg/dL (ref 0.50–1.35)
Chloride: 107 mEq/L (ref 96–112)
GFR calc non Af Amer: >90 mL/min (ref >90)
Glucose, Bld: 119 mg/dL — ABNORMAL HIGH (ref 70–99)
Potassium: 3.6 mEq/L — ABNORMAL LOW (ref 3.7–5.3)
Sodium: 144 mEq/L (ref 137–147)

## 2014-02-27 LAB — TRIGLYCERIDES: Triglycerides: 136 mg/dL (ref <150)

## 2014-02-27 MED ORDER — IPRATROPIUM-ALBUTEROL 0.5-2.5 (3) MG/3ML IN SOLN
3.0000 mL | RESPIRATORY_TRACT | Status: DC | PRN
  Administered 2014-03-01: 3 mL via RESPIRATORY_TRACT
  Filled 2014-02-27: qty 3

## 2014-02-27 MED ORDER — IPRATROPIUM-ALBUTEROL 0.5-2.5 (3) MG/3ML IN SOLN
3.0000 mL | Freq: Three times a day (TID) | RESPIRATORY_TRACT | Status: DC
  Administered 2014-02-27 – 2014-03-04 (×16): 3 mL via RESPIRATORY_TRACT
  Filled 2014-02-27 (×17): qty 3

## 2014-02-27 MED ORDER — SUCCINYLCHOLINE CHLORIDE 20 MG/ML IJ SOLN
INTRAMUSCULAR | Status: AC | PRN
  Administered 2014-02-27: 120 mg via INTRAVENOUS

## 2014-02-27 MED ORDER — FENTANYL CITRATE 0.05 MG/ML IJ SOLN
100.0000 ug | INTRAMUSCULAR | Status: DC | PRN
  Administered 2014-02-27 – 2014-03-04 (×2): 100 ug via INTRAVENOUS
  Filled 2014-02-27: qty 2

## 2014-02-27 MED ORDER — PROPOFOL 10 MG/ML IV EMUL
0.0000 ug/kg/min | INTRAVENOUS | Status: DC
  Administered 2014-02-27: 15 ug/kg/min via INTRAVENOUS
  Administered 2014-02-27: 30 ug/kg/min via INTRAVENOUS
  Administered 2014-02-28: 15 ug/kg/min via INTRAVENOUS
  Filled 2014-02-27 (×2): qty 100

## 2014-02-27 MED ORDER — PROPOFOL 10 MG/ML IV EMUL
INTRAVENOUS | Status: AC
  Filled 2014-02-27: qty 100

## 2014-02-27 MED ORDER — FENTANYL CITRATE 0.05 MG/ML IJ SOLN
INTRAMUSCULAR | Status: AC
  Administered 2014-02-27: 100 ug via INTRAVENOUS
  Filled 2014-02-27: qty 2

## 2014-02-27 MED ORDER — IOHEXOL 350 MG/ML SOLN
100.0000 mL | Freq: Once | INTRAVENOUS | Status: AC | PRN
  Administered 2014-02-27: 50 mL via INTRAVENOUS

## 2014-02-27 MED ORDER — FENTANYL CITRATE 0.05 MG/ML IJ SOLN
INTRAMUSCULAR | Status: AC | PRN
  Administered 2014-02-27: 100 ug via INTRAVENOUS

## 2014-02-27 MED ORDER — PANTOPRAZOLE SODIUM 40 MG IV SOLR
40.0000 mg | Freq: Every day | INTRAVENOUS | Status: DC
  Administered 2014-02-27 – 2014-03-05 (×7): 40 mg via INTRAVENOUS
  Filled 2014-02-27 (×7): qty 40

## 2014-02-27 MED ORDER — VITAL HIGH PROTEIN PO LIQD
1000.0000 mL | ORAL | Status: DC
  Administered 2014-02-27: 1000 mL
  Administered 2014-02-27: 18:00:00
  Administered 2014-02-28 – 2014-03-01 (×2): 1000 mL
  Filled 2014-02-27 (×7): qty 1000

## 2014-02-27 MED ORDER — ETOMIDATE 2 MG/ML IV SOLN
INTRAVENOUS | Status: AC | PRN
  Administered 2014-02-27: 20 mg via INTRAVENOUS

## 2014-02-27 MED ORDER — ALTEPLASE 2 MG IJ SOLR
2.0000 mg | Freq: Once | INTRAMUSCULAR | Status: AC
Start: 2014-02-27 — End: 2014-02-27
  Administered 2014-02-27: 2 mg
  Filled 2014-02-27: qty 2

## 2014-02-27 MED ORDER — POTASSIUM CHLORIDE 10 MEQ/50ML IV SOLN
10.0000 meq | INTRAVENOUS | Status: AC
  Administered 2014-02-27 (×2): 10 meq via INTRAVENOUS
  Filled 2014-02-27: qty 50

## 2014-02-27 MED ORDER — PRO-STAT SUGAR FREE PO LIQD
60.0000 mL | Freq: Three times a day (TID) | ORAL | Status: DC
Start: 2014-02-27 — End: 2014-03-05
  Administered 2014-02-27 – 2014-03-05 (×14): 60 mL
  Filled 2014-02-27 (×23): qty 60

## 2014-02-27 MED ORDER — FUROSEMIDE 10 MG/ML IJ SOLN
40.0000 mg | Freq: Once | INTRAMUSCULAR | Status: AC
  Administered 2014-02-27: 40 mg via INTRAVENOUS
  Filled 2014-02-27: qty 4

## 2014-02-27 NOTE — Progress Notes (Signed)
PT Cancellation Note  Patient Details Name: [name] [name] MRN: [id number] DOB: 1950/12/20   Cancelled Treatment:    Reason Eval/Treat Not Completed: Patient declined, no reason specified (patient reintubated this am, hold therapy)   Duncan Dull 02/27/2014, 9:42 AM Alben Deeds, PT DPT  [phone]

## 2014-02-27 NOTE — Progress Notes (Signed)
Inpatient Rehabilitation  Dr. Naaman Plummer completed PMR consult yesterday.  Pt. reintubated this am.  Will follow along for progress.  Please call if questions.  My co-worker Danne Baxter will assume pt's case tomorrow in my absence and can be reached at [phone].  [facility] Admissions Coordinator Cell [phone] Office [phone]

## 2014-02-27 NOTE — Anesthesia Procedure Notes (Signed)
Procedure Name: Intubation Date/Time: 02/27/2014 5:48 AM Performed by: Zorita Pang Pre-anesthesia Checklist: Patient identified, Emergency Drugs available, Suction available, Patient being monitored and Timeout performed Patient Re-evaluated:Patient Re-evaluated prior to inductionOxygen Delivery Method: Ambu bag Preoxygenation: Pre-oxygenation with 100% oxygen Intubation Type: IV induction, Rapid sequence and Cricoid Pressure applied Ventilation: Mask ventilation without difficulty Laryngoscope Size: Mac and 3 Grade View: Grade III Tube type: Oral Tube size: 8.0 mm Number of attempts: 2 Airway Equipment and Method: Stylet and Video-laryngoscopy Placement Confirmation: ETT inserted through vocal cords under direct vision and CO2 detector Secured at: 22 cm Tube secured with: Tape Dental Injury: Teeth and Oropharynx as per pre-operative assessment  Difficulty Due To: Difficulty was anticipated, Difficult Airway- due to anterior larynx and Difficult Airway- due to limited oral opening Future Recommendations: Recommend- induction with short-acting agent, and alternative techniques readily available Comments: Pt. With receding chin and slightly small mouth.  MAC 3 laryngoscopy with Grade 3/Grade IV view; Glide scope with Grade 1 view;  Intubated with ease; Dr. Conrad [place] present for intubation.
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
## 2014-02-27 NOTE — Progress Notes (Signed)
SLP Cancellation Note  Patient Details Name: [name] [name] MRN: [id number] DOB: 1950/05/21   Cancelled treatment:       Reason Eval/Treat Not Completed: Medical issues which prohibited therapy. Pt again intubated. Please reorder when pt ready. [name], [place] CCC-SLP [phone]    [name] 02/27/2014, 8:17 AM

## 2014-02-27 NOTE — Progress Notes (Signed)
I contacted pt's brother Revin Corker and spoke also with pt's sister, Ariq Khamis by phone. Pt has been living with his Ex wife for a year to care for her due to her Cancer. Prior to that, pt had lived with his sister, Darliss Ridgel.Collie Siad, his exwife and pt had been married 37 years. Divorced for over a year. No children. Collie Siad is not able to care for pt at d/c. [facility] can provide supervision to min assist but pt would have to be ambulatory at d/c to return to live with sister due to her shoulder issues bilaterally. I discussed that pt had to be weaned from vent before rehab venue options would be clarified. She states [facility] is 3 miles from her home and would be an option. I will follow pt's progress to assist in planning dispo once medically ready. [phone]
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
## 2014-02-28 ENCOUNTER — Inpatient Hospital Stay (HOSPITAL_COMMUNITY): Payer: No Typology Code available for payment source

## 2014-02-28 LAB — BLOOD GAS, ARTERIAL
ACID-BASE EXCESS: 0.4 mmol/L (ref 0.0–2.0)
BICARBONATE: 24.3 meq/L — AB (ref 20.0–24.0)
Drawn by: 40415
FIO2: 0.4 %
MECHVT: 530 mL
O2 Saturation: 96.7 %
PEEP: 5 cmH2O
PO2 ART: 83.5 mmHg (ref 80.0–100.0)
Patient temperature: 98.7
RATE: 14 resp/min
TCO2: 25.4 mmol/L (ref 0–100)
pCO2 arterial: 37.6 mmHg (ref 35.0–45.0)
pH, Arterial: 7.427 (ref 7.350–7.450)

## 2014-02-28 LAB — BASIC METABOLIC PANEL
Anion gap: 11 (ref 5–15)
BUN: 32 mg/dL — ABNORMAL HIGH (ref 6–23)
CALCIUM: 8 mg/dL — AB (ref 8.4–10.5)
CO2: 27 mEq/L (ref 19–32)
Chloride: 108 mEq/L (ref 96–112)
Creatinine, Ser: 0.81 mg/dL (ref 0.50–1.35)
GFR calc Af Amer: >90 mL/min (ref >90)
GFR calc non Af Amer: >90 mL/min (ref >90)
GLUCOSE: 113 mg/dL — AB (ref 70–99)
POTASSIUM: 3.6 meq/L — AB (ref 3.7–5.3)
Sodium: 146 mEq/L (ref 137–147)

## 2014-02-28 LAB — CBC
HCT: 27.6 % — ABNORMAL LOW (ref 39.0–52.0)
Hemoglobin: 8.7 g/dL — ABNORMAL LOW (ref 13.0–17.0)
MCH: 30.5 pg (ref 26.0–34.0)
MCHC: 31.5 g/dL (ref 30.0–36.0)
MCV: 96.8 fL (ref 78.0–100.0)
Platelets: 375 10*3/uL (ref 150–400)
RBC: 2.85 MIL/uL — AB (ref 4.22–5.81)
RDW: 15.3 % (ref 11.5–15.5)
WBC: 7.7 10*3/uL (ref 4.0–10.5)

## 2014-02-28 LAB — GLUCOSE, CAPILLARY
Glucose-Capillary: 106 mg/dL — ABNORMAL HIGH (ref 70–99)
Glucose-Capillary: 100 mg/dL — ABNORMAL HIGH (ref 70–99)
Glucose-Capillary: 110 mg/dL — ABNORMAL HIGH (ref 70–99)
Glucose-Capillary: 114 mg/dL — ABNORMAL HIGH (ref 70–99)
Glucose-Capillary: 105 mg/dL — ABNORMAL HIGH (ref 70–99)

## 2014-02-28 MED ORDER — LIDOCAINE HCL (PF) 1 % IJ SOLN
INTRAMUSCULAR | Status: AC
  Filled 2014-02-28: qty 10

## 2014-02-28 MED ORDER — METOPROLOL TARTRATE 1 MG/ML IV SOLN
5.0000 mg | Freq: Once | INTRAVENOUS | Status: AC
  Administered 2014-02-28: 5 mg via INTRAVENOUS
  Filled 2014-02-28: qty 5
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
## 2014-02-28 NOTE — Progress Notes (Signed)
02/28/2014-1610- Respiratory care note- Pt suctioned and extubated as per MD order to 4lpm cannula- sats 93% on 4lpm.  Pt vocalizing post extubation.  A good strong productive cough noted post extubation.  RN at bedside for procedure.  Pt vocalizing post extubation.  Will monitor pt progress and encourage deep breathing and cough.  s Jaclyn Carew rrt, rcp
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
## 2014-02-28 NOTE — Procedures (Signed)
Successful US guided left thoracentesis. Yielded 5109mL of bloody pleural fluid. Pt tolerated procedure well. No immediate complications.  Specimen was not sent for labs. CXR ordered.  Ascencion Dike PA-C 02/28/2014 10:56 AM
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
## 2014-02-28 NOTE — Progress Notes (Signed)
Chaplain initiated visit with pt and family. Chaplain made her services known. Will follow as needed.   02/28/14 1200  Clinical Encounter Type  Visited With Patient and family together;Health care provider  Visit Type Follow-up;Spiritual support  Referral From Nurse  Recommendations Follow Up  Rolly Salter, Chaplain 02/28/2014 12:15 PM

## 2014-03-01 ENCOUNTER — Inpatient Hospital Stay (HOSPITAL_COMMUNITY): Payer: No Typology Code available for payment source

## 2014-03-01 LAB — GLUCOSE, CAPILLARY
GLUCOSE-CAPILLARY: 99 mg/dL (ref 70–99)
GLUCOSE-CAPILLARY: 112 mg/dL — AB (ref 70–99)
Glucose-Capillary: 105 mg/dL — ABNORMAL HIGH (ref 70–99)
Glucose-Capillary: 107 mg/dL — ABNORMAL HIGH (ref 70–99)
Glucose-Capillary: 110 mg/dL — ABNORMAL HIGH (ref 70–99)
Glucose-Capillary: 95 mg/dL (ref 70–99)

## 2014-03-01 LAB — BASIC METABOLIC PANEL
ANION GAP: 9 (ref 5–15)
BUN: 24 mg/dL — ABNORMAL HIGH (ref 6–23)
CALCIUM: 8.1 mg/dL — AB (ref 8.4–10.5)
CO2: 27 mEq/L (ref 19–32)
Chloride: 110 mEq/L (ref 96–112)
Creatinine, Ser: 0.71 mg/dL (ref 0.50–1.35)
GFR calc Af Amer: >90 mL/min (ref >90)
Glucose, Bld: 101 mg/dL — ABNORMAL HIGH (ref 70–99)
Potassium: 3.6 mEq/L — ABNORMAL LOW (ref 3.7–5.3)
Sodium: 146 mEq/L (ref 137–147)

## 2014-03-01 LAB — CBC
HCT: 29.5 % — ABNORMAL LOW (ref 39.0–52.0)
Hemoglobin: 9.2 g/dL — ABNORMAL LOW (ref 13.0–17.0)
MCH: 31.1 pg (ref 26.0–34.0)
MCHC: 31.2 g/dL (ref 30.0–36.0)
MCV: 99.7 fL (ref 78.0–100.0)
Platelets: 356 10*3/uL (ref 150–400)
RBC: 2.96 MIL/uL — ABNORMAL LOW (ref 4.22–5.81)
RDW: 15.2 % (ref 11.5–15.5)
WBC: 8.4 10*3/uL (ref 4.0–10.5)

## 2014-03-01 MED ORDER — POTASSIUM CHLORIDE 20 MEQ/15ML (10%) PO LIQD: 40.0000 meq | Freq: Once | ORAL | Status: DC

## 2014-03-01 MED ORDER — FUROSEMIDE 10 MG/ML IJ SOLN: 20.0000 mg | Freq: Once | INTRAMUSCULAR | Status: DC

## 2014-03-01 MED ORDER — GUAIFENESIN 100 MG/5ML PO SOLN
5.0000 mL | Freq: Four times a day (QID) | ORAL | Status: DC
  Administered 2014-03-01 – 2014-03-05 (×16): 100 mg
  Filled 2014-03-01 (×21): qty 5

## 2014-03-01 MED ORDER — POTASSIUM CHLORIDE 20 MEQ/15ML (10%) PO LIQD
40.0000 meq | Freq: Two times a day (BID) | ORAL | Status: AC
  Administered 2014-03-01 (×2): 40 meq via ORAL
  Filled 2014-03-01 (×4): qty 30
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
## 2014-03-01 NOTE — Progress Notes (Signed)
UR completed.  CIR evaluating the patient.   Sandi Mariscal, RN BSN [place] CCM Trauma/Neuro ICU Case Manager [phone]
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
## 2014-03-01 NOTE — Progress Notes (Signed)
Patient ID: [name] [name], male   DOB: Jan 06, 1951, 63 y.o.   MRN: [id number] Will sign off, call service if questions. Will see in followup once discharged.
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
## 2014-03-02 LAB — BASIC METABOLIC PANEL
Anion gap: 8 (ref 5–15)
BUN: 26 mg/dL — ABNORMAL HIGH (ref 6–23)
CO2: 27 mEq/L (ref 19–32)
CREATININE: 0.66 mg/dL (ref 0.50–1.35)
Calcium: 8.2 mg/dL — ABNORMAL LOW (ref 8.4–10.5)
Chloride: 107 mEq/L (ref 96–112)
GFR calc Af Amer: >90 mL/min (ref >90)
GFR calc non Af Amer: >90 mL/min (ref >90)
GLUCOSE: 109 mg/dL — AB (ref 70–99)
Potassium: 4.4 mEq/L (ref 3.7–5.3)
Sodium: 142 mEq/L (ref 137–147)

## 2014-03-02 LAB — GLUCOSE, CAPILLARY
GLUCOSE-CAPILLARY: 109 mg/dL — AB (ref 70–99)
GLUCOSE-CAPILLARY: 119 mg/dL — AB (ref 70–99)
GLUCOSE-CAPILLARY: 98 mg/dL (ref 70–99)
Glucose-Capillary: 121 mg/dL — ABNORMAL HIGH (ref 70–99)
Glucose-Capillary: 107 mg/dL — ABNORMAL HIGH (ref 70–99)
Glucose-Capillary: 104 mg/dL — ABNORMAL HIGH (ref 70–99)

## 2014-03-02 LAB — CBC
HEMATOCRIT: 28.6 % — AB (ref 39.0–52.0)
HEMOGLOBIN: 8.8 g/dL — AB (ref 13.0–17.0)
MCH: 29.9 pg (ref 26.0–34.0)
MCHC: 30.8 g/dL (ref 30.0–36.0)
MCV: 97.3 fL (ref 78.0–100.0)
Platelets: 307 10*3/uL (ref 150–400)
RBC: 2.94 MIL/uL — AB (ref 4.22–5.81)
RDW: 14.7 % (ref 11.5–15.5)
WBC: 7.9 10*3/uL (ref 4.0–10.5)

## 2014-03-02 LAB — TRIGLYCERIDES: Triglycerides: 109 mg/dL (ref <150)

## 2014-03-02 MED ORDER — VITAL HIGH PROTEIN PO LIQD
1000.0000 mL | ORAL | Status: DC
  Administered 2014-03-02 – 2014-03-04 (×5): 1000 mL
  Filled 2014-03-02 (×7): qty 1000
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
## 2014-03-03 LAB — BASIC METABOLIC PANEL
Anion gap: 11 (ref 5–15)
BUN: 22 mg/dL (ref 6–23)
CALCIUM: 8.3 mg/dL — AB (ref 8.4–10.5)
CO2: 25 mEq/L (ref 19–32)
Chloride: 102 mEq/L (ref 96–112)
Creatinine, Ser: 0.57 mg/dL (ref 0.50–1.35)
GFR calc Af Amer: >90 mL/min (ref >90)
GFR calc non Af Amer: >90 mL/min (ref >90)
GLUCOSE: 116 mg/dL — AB (ref 70–99)
Potassium: 3.8 mEq/L (ref 3.7–5.3)
Sodium: 138 mEq/L (ref 137–147)

## 2014-03-03 LAB — URINALYSIS, ROUTINE W REFLEX MICROSCOPIC
BILIRUBIN URINE: NEGATIVE
GLUCOSE, UA: NEGATIVE mg/dL
Hgb urine dipstick: NEGATIVE
Ketones, ur: NEGATIVE mg/dL
NITRITE: NEGATIVE
PROTEIN: NEGATIVE mg/dL
Specific Gravity, Urine: 1.017 (ref 1.005–1.030)
Urobilinogen, UA: 4 mg/dL — ABNORMAL HIGH (ref 0.0–1.0)
pH: 6 (ref 5.0–8.0)

## 2014-03-03 LAB — GLUCOSE, CAPILLARY
GLUCOSE-CAPILLARY: 131 mg/dL — AB (ref 70–99)
Glucose-Capillary: 115 mg/dL — ABNORMAL HIGH (ref 70–99)
Glucose-Capillary: 136 mg/dL — ABNORMAL HIGH (ref 70–99)
Glucose-Capillary: 111 mg/dL — ABNORMAL HIGH (ref 70–99)

## 2014-03-03 LAB — CBC WITH DIFFERENTIAL/PLATELET
Basophils Absolute: 0 10*3/uL (ref 0.0–0.1)
Basophils Relative: 1 % (ref 0–1)
EOS ABS: 0.5 10*3/uL (ref 0.0–0.7)
EOS PCT: 6 % — AB (ref 0–5)
HCT: 30 % — ABNORMAL LOW (ref 39.0–52.0)
HEMOGLOBIN: 9.7 g/dL — AB (ref 13.0–17.0)
LYMPHS ABS: 1 10*3/uL (ref 0.7–4.0)
Lymphocytes Relative: 12 % (ref 12–46)
MCH: 31.2 pg (ref 26.0–34.0)
MCHC: 32.3 g/dL (ref 30.0–36.0)
MCV: 96.5 fL (ref 78.0–100.0)
MONO ABS: 1 10*3/uL (ref 0.1–1.0)
MONOS PCT: 12 % (ref 3–12)
Neutro Abs: 5.8 10*3/uL (ref 1.7–7.7)
Neutrophils Relative %: 69 % (ref 43–77)
Platelets: 287 10*3/uL (ref 150–400)
RBC: 3.11 MIL/uL — AB (ref 4.22–5.81)
RDW: 14.5 % (ref 11.5–15.5)
WBC: 8.3 10*3/uL (ref 4.0–10.5)

## 2014-03-03 LAB — URINE MICROSCOPIC-ADD ON

## 2014-03-03 MED ORDER — INFLUENZA VAC SPLIT QUAD 0.5 ML IM SUSY
0.5000 mL | PREFILLED_SYRINGE | INTRAMUSCULAR | Status: AC
  Administered 2014-03-04: 0.5 mL via INTRAMUSCULAR
  Filled 2014-03-03: qty 0.5
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
## 2014-03-04 ENCOUNTER — Inpatient Hospital Stay (HOSPITAL_COMMUNITY): Payer: No Typology Code available for payment source

## 2014-03-04 LAB — GLUCOSE, CAPILLARY
GLUCOSE-CAPILLARY: 114 mg/dL — AB (ref 70–99)
GLUCOSE-CAPILLARY: 118 mg/dL — AB (ref 70–99)
GLUCOSE-CAPILLARY: 108 mg/dL — AB (ref 70–99)
Glucose-Capillary: 126 mg/dL — ABNORMAL HIGH (ref 70–99)
Glucose-Capillary: 127 mg/dL — ABNORMAL HIGH (ref 70–99)
Glucose-Capillary: 91 mg/dL (ref 70–99)
Glucose-Capillary: 123 mg/dL — ABNORMAL HIGH (ref 70–99)

## 2014-03-04 MED ORDER — MORPHINE SULFATE 2 MG/ML IJ SOLN
2.0000 mg | INTRAMUSCULAR | Status: DC | PRN
  Administered 2014-03-04 – 2014-03-05 (×3): 2 mg via INTRAVENOUS
  Filled 2014-03-04 (×3): qty 1

## 2014-03-04 MED ORDER — HYDROCODONE-ACETAMINOPHEN 7.5-325 MG/15ML PO SOLN
10.0000 mL | ORAL | Status: DC | PRN
  Administered 2014-03-04 (×2): 15 mL via ORAL
  Administered 2014-03-05 (×2): 20 mL via ORAL
  Filled 2014-03-04 (×2): qty 30
  Filled 2014-03-04 (×2): qty 15
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
## 2014-03-04 NOTE — Progress Notes (Signed)
Received order to d/c panda tube. Per speech- they recommend leaving panda tube in place until tomorrow when they will be able to do FEES evaluation. Trauma paged, they agree with speech recommendations. Panda tube left in place for now, will wait on further instructions. Will continue to monitor patient.

## 2014-03-04 NOTE — Progress Notes (Signed)
Report called to Lovena Le, RN on 6N. Vital signs stable. Medications current. Will notify E-link and CCM of transfer.

## 2014-03-04 NOTE — Plan of Care (Signed)
?  Problem: Acute Rehab PT Goals(only PT should resolve) ?Goal: Pt Will Go Supine/Side To Sit ?Outcome: Progressing ?Goal: Patient Will Transfer Sit To/From Stand ?Outcome: Progressing ?Goal: Pt Will Transfer Bed To Chair/Chair To Bed ?Outcome: Progressing ?Goal: Pt Will Ambulate ?Outcome: Progressing ?  ?

## 2014-03-05 ENCOUNTER — Encounter (HOSPITAL_COMMUNITY): Payer: Self-pay | Admitting: Emergency Medicine

## 2014-03-05 ENCOUNTER — Encounter (HOSPITAL_COMMUNITY): Payer: Self-pay | Admitting: Physical Medicine and Rehabilitation

## 2014-03-05 ENCOUNTER — Encounter (HOSPITAL_COMMUNITY): Payer: Self-pay

## 2014-03-05 ENCOUNTER — Inpatient Hospital Stay (HOSPITAL_COMMUNITY)
Admission: RE | Admit: 2014-03-05 | Discharge: 2014-03-16 | DRG: 560 | Disposition: A | Discharge status: Home Health | Payer: Medicare Other | Source: Intra-hospital | Attending: Physical Medicine & Rehabilitation | Admitting: Physical Medicine & Rehabilitation

## 2014-03-05 DIAGNOSIS — S32010A Wedge compression fracture of first lumbar vertebra, initial encounter for closed fracture: Secondary | ICD-10-CM | POA: Diagnosis present

## 2014-03-05 DIAGNOSIS — S069X3A Unspecified intracranial injury with loss of consciousness of 1 hour to 5 hours 59 minutes, initial encounter: Secondary | ICD-10-CM | POA: Diagnosis present

## 2014-03-05 DIAGNOSIS — S32019D Unspecified fracture of first lumbar vertebra, subsequent encounter for fracture with routine healing: Secondary | ICD-10-CM

## 2014-03-05 DIAGNOSIS — E440 Moderate protein-calorie malnutrition: Secondary | ICD-10-CM | POA: Diagnosis present

## 2014-03-05 DIAGNOSIS — F1010 Alcohol abuse, uncomplicated: Secondary | ICD-10-CM | POA: Diagnosis present

## 2014-03-05 DIAGNOSIS — D62 Acute posthemorrhagic anemia: Secondary | ICD-10-CM | POA: Diagnosis present

## 2014-03-05 DIAGNOSIS — S32009S Unspecified fracture of unspecified lumbar vertebra, sequela: Secondary | ICD-10-CM

## 2014-03-05 DIAGNOSIS — S82852D Displaced trimalleolar fracture of left lower leg, subsequent encounter for closed fracture with routine healing: Secondary | ICD-10-CM | POA: Diagnosis present

## 2014-03-05 DIAGNOSIS — I10 Essential (primary) hypertension: Secondary | ICD-10-CM | POA: Diagnosis present

## 2014-03-05 DIAGNOSIS — R1310 Dysphagia, unspecified: Secondary | ICD-10-CM | POA: Diagnosis present

## 2014-03-05 DIAGNOSIS — S82852A Displaced trimalleolar fracture of left lower leg, initial encounter for closed fracture: Secondary | ICD-10-CM | POA: Diagnosis present

## 2014-03-05 DIAGNOSIS — S069X0D Unspecified intracranial injury without loss of consciousness, subsequent encounter: Secondary | ICD-10-CM

## 2014-03-05 DIAGNOSIS — D649 Anemia, unspecified: Secondary | ICD-10-CM | POA: Diagnosis present

## 2014-03-05 DIAGNOSIS — R1312 Dysphagia, oropharyngeal phase: Secondary | ICD-10-CM | POA: Diagnosis present

## 2014-03-05 DIAGNOSIS — S069X4A Unspecified intracranial injury with loss of consciousness of 6 hours to 24 hours, initial encounter: Secondary | ICD-10-CM

## 2014-03-05 LAB — TRIGLYCERIDES: Triglycerides: 86 mg/dL (ref <150)

## 2014-03-05 MED ORDER — IPRATROPIUM-ALBUTEROL 0.5-2.5 (3) MG/3ML IN SOLN
3.0000 mL | Freq: Two times a day (BID) | RESPIRATORY_TRACT | Status: DC
  Administered 2014-03-06 – 2014-03-07 (×3): 3 mL via RESPIRATORY_TRACT
  Filled 2014-03-05 (×3): qty 3

## 2014-03-05 MED ORDER — GUAIFENESIN-DM 100-10 MG/5ML PO SYRP: 5.0000 mL | ORAL_SOLUTION | Freq: Four times a day (QID) | ORAL | Status: DC | PRN

## 2014-03-05 MED ORDER — METOPROLOL TARTRATE 25 MG/10 ML ORAL SUSPENSION
12.5000 mg | Freq: Two times a day (BID) | ORAL | Status: DC
  Administered 2014-03-05 – 2014-03-11 (×12): 12.5 mg
  Filled 2014-03-05 (×15): qty 5

## 2014-03-05 MED ORDER — DIPHENHYDRAMINE HCL 12.5 MG/5ML PO ELIX: 12.5000 mg | ORAL_SOLUTION | Freq: Four times a day (QID) | ORAL | Status: DC | PRN

## 2014-03-05 MED ORDER — ALUM & MAG HYDROXIDE-SIMETH 200-200-20 MG/5ML PO SUSP
30.0000 mL | ORAL | Status: DC | PRN
  Administered 2014-03-08: 30 mL via ORAL
  Filled 2014-03-05: qty 30

## 2014-03-05 MED ORDER — SODIUM CHLORIDE 0.9 % IV SOLN
INTRAVENOUS | Status: DC
  Filled 2014-03-05: qty 1000

## 2014-03-05 MED ORDER — PANTOPRAZOLE SODIUM 40 MG IV SOLR
40.0000 mg | Freq: Every day | INTRAVENOUS | Status: DC
  Administered 2014-03-06: 40 mg via INTRAVENOUS
  Filled 2014-03-05 (×2): qty 40

## 2014-03-05 MED ORDER — FUROSEMIDE 20 MG PO TABS
20.0000 mg | ORAL_TABLET | Freq: Every day | ORAL | Status: DC
  Administered 2014-03-06 – 2014-03-16 (×11): 20 mg via ORAL
  Filled 2014-03-05 (×12): qty 1

## 2014-03-05 MED ORDER — MORPHINE SULFATE 2 MG/ML IJ SOLN
2.0000 mg | Freq: Four times a day (QID) | INTRAMUSCULAR | Status: DC | PRN
Start: 2014-03-05 — End: 2014-03-05

## 2014-03-05 MED ORDER — HYDROCODONE-ACETAMINOPHEN 5-325 MG PO TABS
1.0000 | ORAL_TABLET | ORAL | Status: DC | PRN
  Administered 2014-03-05 – 2014-03-16 (×37): 2 via ORAL
  Filled 2014-03-05 (×38): qty 2

## 2014-03-05 MED ORDER — BISACODYL 10 MG RE SUPP: 10.0000 mg | Freq: Every day | RECTAL | Status: DC | PRN

## 2014-03-05 MED ORDER — CETYLPYRIDINIUM CHLORIDE 0.05 % MT LIQD
7.0000 mL | Freq: Two times a day (BID) | OROMUCOSAL | Status: DC
  Administered 2014-03-05 – 2014-03-16 (×20): 7 mL via OROMUCOSAL

## 2014-03-05 MED ORDER — SODIUM CHLORIDE 0.9 % IJ SOLN
10.0000 mL | INTRAMUSCULAR | Status: DC | PRN
  Administered 2014-03-05 – 2014-03-07 (×3): 10 mL
  Administered 2014-03-07 – 2014-03-08 (×2): 20 mL
  Administered 2014-03-10 – 2014-03-11 (×5): 10 mL
  Administered 2014-03-12 – 2014-03-13 (×2): 20 mL
  Filled 2014-03-05 (×11): qty 40

## 2014-03-05 MED ORDER — METOCLOPRAMIDE HCL 5 MG PO TABS
5.0000 mg | ORAL_TABLET | Freq: Three times a day (TID) | ORAL | Status: AC
  Administered 2014-03-05 – 2014-03-07 (×7): 5 mg via ORAL
  Filled 2014-03-05 (×8): qty 1

## 2014-03-05 MED ORDER — ONDANSETRON HCL 4 MG/2ML IJ SOLN: 4.0000 mg | Freq: Four times a day (QID) | INTRAMUSCULAR | Status: DC | PRN

## 2014-03-05 MED ORDER — FLEET ENEMA 7-19 GM/118ML RE ENEM: 1.0000 | ENEMA | Freq: Once | RECTAL | Status: AC | PRN

## 2014-03-05 MED ORDER — METHOCARBAMOL 500 MG PO TABS
500.0000 mg | ORAL_TABLET | Freq: Four times a day (QID) | ORAL | Status: DC | PRN
  Administered 2014-03-05 – 2014-03-16 (×16): 500 mg via ORAL
  Filled 2014-03-05 (×16): qty 1

## 2014-03-05 MED ORDER — ONDANSETRON HCL 4 MG PO TABS: 4.0000 mg | ORAL_TABLET | Freq: Four times a day (QID) | ORAL | Status: DC | PRN

## 2014-03-05 MED ORDER — IPRATROPIUM-ALBUTEROL 0.5-2.5 (3) MG/3ML IN SOLN
3.0000 mL | Freq: Two times a day (BID) | RESPIRATORY_TRACT | Status: DC
  Administered 2014-03-05: 3 mL via RESPIRATORY_TRACT
  Filled 2014-03-05: qty 3

## 2014-03-05 MED ORDER — ENOXAPARIN SODIUM 40 MG/0.4ML ~~LOC~~ SOLN
40.0000 mg | SUBCUTANEOUS | Status: DC
  Administered 2014-03-05 – 2014-03-15 (×11): 40 mg via SUBCUTANEOUS
  Filled 2014-03-05 (×12): qty 0.4

## 2014-03-05 MED ORDER — IPRATROPIUM-ALBUTEROL 0.5-2.5 (3) MG/3ML IN SOLN: 3.0000 mL | RESPIRATORY_TRACT | Status: DC | PRN

## 2014-03-05 MED ORDER — BACITRACIN ZINC 500 UNIT/GM EX OINT
TOPICAL_OINTMENT | Freq: Two times a day (BID) | CUTANEOUS | Status: DC
  Administered 2014-03-06 – 2014-03-14 (×13): via TOPICAL
  Filled 2014-03-05 (×5): qty 28.35

## 2014-03-05 MED ORDER — ACETAMINOPHEN 325 MG PO TABS: 325.0000 mg | ORAL_TABLET | ORAL | Status: DC | PRN

## 2014-03-05 MED ORDER — RESOURCE THICKENUP CLEAR PO POWD
ORAL | Status: DC | PRN
  Filled 2014-03-05: qty 125

## 2014-03-05 MED ORDER — SODIUM CHLORIDE 0.9 % IV SOLN
INTRAVENOUS | Status: AC
  Filled 2014-03-05: qty 1000

## 2014-03-05 MED ORDER — SODIUM CHLORIDE 0.9 % IV SOLN
INTRAVENOUS | Status: DC
  Administered 2014-03-05: 1000 mL via INTRAVENOUS

## 2014-03-05 MED ORDER — TRAZODONE HCL 50 MG PO TABS
25.0000 mg | ORAL_TABLET | Freq: Every evening | ORAL | Status: DC | PRN
  Administered 2014-03-07: 50 mg via ORAL
  Filled 2014-03-05: qty 1

## 2014-03-05 MED ORDER — RESOURCE THICKENUP CLEAR PO POWD
ORAL | Status: DC | PRN
Start: 2014-03-05 — End: 2014-03-05
  Filled 2014-03-05: qty 125

## 2014-03-05 MED ORDER — ENOXAPARIN SODIUM 30 MG/0.3ML ~~LOC~~ SOLN: 30.0000 mg | Freq: Two times a day (BID) | SUBCUTANEOUS | Status: DC

## 2014-03-05 MED ORDER — SODIUM CHLORIDE 0.9 % IV SOLN: INTRAVENOUS | Status: AC
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
## 2014-03-05 NOTE — Plan of Care (Signed)
Problem: Phase I Progression Outcomes Goal: OOB as tolerated unless otherwise ordered Outcome: Completed/Met Date Met:  03/05/14 Goal: Voiding-avoid urinary catheter unless indicated Outcome: Completed/Met Date Met:  03/05/14 Goal: Vital signs/hemodynamically stable Outcome: Completed/Met Date Met:  03/05/14     
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
## 2014-03-05 NOTE — Progress Notes (Addendum)
UR completed.  Medicare IM (Important Message) delivered to patient today by me in anticipation of discharge.    Rocko Fesperman, RN BSN MHA CCM Trauma/Neuro ICU Case Manager [phone]  
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
## 2014-03-05 NOTE — Plan of Care (Signed)
Problem: RH BOWEL ELIMINATION Goal: RH STG MANAGE BOWEL WITH ASSISTANCE STG Manage Bowel with min Assistance.  Outcome: Progressing Goal: RH STG MANAGE BOWEL W/MEDICATION W/ASSISTANCE STG Manage Bowel with Medication with min Assistance.  Outcome: Progressing  Problem: RH SKIN INTEGRITY Goal: RH STG SKIN FREE OF INFECTION/BREAKDOWN Outcome: Progressing Goal: RH STG MAINTAIN SKIN INTEGRITY WITH ASSISTANCE STG Maintain Skin Integrity With min Assistance.  Outcome: Progressing  Problem: RH SAFETY Goal: RH STG ADHERE TO SAFETY PRECAUTIONS W/ASSISTANCE/DEVICE STG Adhere to Safety Precautions With min Assistance/Device.  Outcome: Progressing Goal: RH STG DECREASED RISK OF FALL WITH ASSISTANCE STG Decreased Risk of Fall With min Assistance.  Outcome: Progressing  Problem: RH COGNITION-NURSING Goal: RH STG ANTICIPATES NEEDS/CALLS FOR ASSIST W/ASSIST/CUES STG Anticipates Needs/Calls for min Assist With Assistance/Cues.  Outcome: Progressing  Problem: RH PAIN MANAGEMENT Goal: RH STG PAIN MANAGED AT OR BELOW PT'S PAIN GOAL Less than 3 out of 10  Outcome: Not Progressing Patient reported pain to be the same as before hydrocodone   Problem: RH KNOWLEDGE DEFICIT Goal: RH STG INCREASE KNOWLEDGE OF HYPERTENSION Outcome: Progressing

## 2014-03-05 NOTE — Plan of Care (Signed)
Problem: SLP Dysphagia Goals Goal: Patient will utilize recommended strategies Patient will utilize recommended strategies during swallow to increase swallowing safety with  Outcome: Progressing     

## 2014-03-05 NOTE — Plan of Care (Signed)
Problem: Phase II Progression Outcomes Goal: Vital signs stable Outcome: Completed/Met Date Met:  03/05/14 Goal: Dressings dry/intact Outcome: Completed/Met Date Met:  03/05/14 Goal: Foley discontinued Outcome: Completed/Met Date Met:  03/05/14
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
## 2014-03-06 ENCOUNTER — Inpatient Hospital Stay (HOSPITAL_COMMUNITY): Payer: Self-pay

## 2014-03-06 ENCOUNTER — Inpatient Hospital Stay (HOSPITAL_COMMUNITY): Payer: MEDICARE | Admitting: Physical Therapy

## 2014-03-06 ENCOUNTER — Inpatient Hospital Stay (HOSPITAL_COMMUNITY): Payer: Medicare Other | Admitting: Occupational Therapy

## 2014-03-06 ENCOUNTER — Inpatient Hospital Stay (HOSPITAL_COMMUNITY): Payer: Medicare Other | Admitting: Speech Pathology

## 2014-03-06 DIAGNOSIS — S069X4S Unspecified intracranial injury with loss of consciousness of 6 hours to 24 hours, sequela: Secondary | ICD-10-CM

## 2014-03-06 DIAGNOSIS — S82852S Displaced trimalleolar fracture of left lower leg, sequela: Secondary | ICD-10-CM

## 2014-03-06 DIAGNOSIS — S32010S Wedge compression fracture of first lumbar vertebra, sequela: Secondary | ICD-10-CM

## 2014-03-06 LAB — COMPREHENSIVE METABOLIC PANEL
ALT: 33 U/L (ref 0–53)
ANION GAP: 11 (ref 5–15)
AST: 36 U/L (ref 0–37)
Albumin: 2.8 g/dL — ABNORMAL LOW (ref 3.5–5.2)
Alkaline Phosphatase: 247 U/L — ABNORMAL HIGH (ref 39–117)
BILIRUBIN TOTAL: 0.6 mg/dL (ref 0.3–1.2)
BUN: 21 mg/dL (ref 6–23)
CHLORIDE: 102 meq/L (ref 96–112)
CO2: 27 mEq/L (ref 19–32)
Calcium: 8.8 mg/dL (ref 8.4–10.5)
Creatinine, Ser: 0.6 mg/dL (ref 0.50–1.35)
GFR calc Af Amer: >90 mL/min (ref >90)
GFR calc non Af Amer: >90 mL/min (ref >90)
Glucose, Bld: 132 mg/dL — ABNORMAL HIGH (ref 70–99)
POTASSIUM: 3.4 meq/L — AB (ref 3.7–5.3)
Sodium: 140 mEq/L (ref 137–147)
Total Protein: 7.1 g/dL (ref 6.0–8.3)

## 2014-03-06 LAB — CBC WITH DIFFERENTIAL/PLATELET
BASOS PCT: 1 % (ref 0–1)
Basophils Absolute: 0.1 10*3/uL (ref 0.0–0.1)
EOS PCT: 9 % — AB (ref 0–5)
Eosinophils Absolute: 0.5 10*3/uL (ref 0.0–0.7)
HEMATOCRIT: 29.4 % — AB (ref 39.0–52.0)
Hemoglobin: 9.3 g/dL — ABNORMAL LOW (ref 13.0–17.0)
Lymphocytes Relative: 18 % (ref 12–46)
Lymphs Abs: 1.1 10*3/uL (ref 0.7–4.0)
MCH: 29.9 pg (ref 26.0–34.0)
MCHC: 31.6 g/dL (ref 30.0–36.0)
MCV: 94.5 fL (ref 78.0–100.0)
MONO ABS: 0.6 10*3/uL (ref 0.1–1.0)
MONOS PCT: 11 % (ref 3–12)
Neutro Abs: 3.8 10*3/uL (ref 1.7–7.7)
Neutrophils Relative %: 63 % (ref 43–77)
Platelets: 264 10*3/uL (ref 150–400)
RBC: 3.11 MIL/uL — ABNORMAL LOW (ref 4.22–5.81)
RDW: 14.2 % (ref 11.5–15.5)
WBC: 6.1 10*3/uL (ref 4.0–10.5)

## 2014-03-06 MED ORDER — PANTOPRAZOLE SODIUM 40 MG PO TBEC
40.0000 mg | DELAYED_RELEASE_TABLET | Freq: Every day | ORAL | Status: DC
  Filled 2014-03-06: qty 1

## 2014-03-06 NOTE — Plan of Care (Signed)
Problem: RH BOWEL ELIMINATION Goal: RH STG MANAGE BOWEL WITH ASSISTANCE STG Manage Bowel with min Assistance.  Outcome: Progressing Goal: RH STG MANAGE BOWEL W/MEDICATION W/ASSISTANCE STG Manage Bowel with Medication with min Assistance.  Outcome: Progressing  Problem: RH SKIN INTEGRITY Goal: RH STG MAINTAIN SKIN INTEGRITY WITH ASSISTANCE STG Maintain Skin Integrity With min Assistance.  Outcome: Progressing  Problem: RH SAFETY Goal: RH STG ADHERE TO SAFETY PRECAUTIONS W/ASSISTANCE/DEVICE STG Adhere to Safety Precautions With min Assistance/Device.  Outcome: Progressing Goal: RH STG DECREASED RISK OF FALL WITH ASSISTANCE STG Decreased Risk of Fall With min Assistance.  Outcome: Progressing  Problem: RH COGNITION-NURSING Goal: RH STG ANTICIPATES NEEDS/CALLS FOR ASSIST W/ASSIST/CUES STG Anticipates Needs/Calls for min Assist With Assistance/Cues.  Outcome: Progressing  Problem: RH PAIN MANAGEMENT Goal: RH STG PAIN MANAGED AT OR BELOW PT'S PAIN GOAL Less than 3 out of 10  Outcome: Progressing
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
## 2014-03-06 NOTE — Progress Notes (Addendum)
Patient information reviewed and entered into eRehab System by Becky Tramond Slinker, covering PPS coordinator. Information including medical coding and functional independence measure will be reviewed and updated through discharge.  Per nursing, patient was given "Data Collection Information Summary for Patients in Inpatient Rehabilitation Facilities with attached Privacy Act Statement Health Care Records" upon admission.     
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
## 2014-03-06 NOTE — Progress Notes (Signed)
Physical Therapy Session Note  Patient Details  Name: [name] [name] MRN: [id number] Date of Birth: 1950-08-02  Today's Date: 03/06/2014 PT Individual Time: 8841-6606 PT Individual Time Calculation (min): 30 min   Skilled Therapeutic Interventions/Progress Updates:    Pt received seated in w/c wearing TLSO; agreeable to therapy. Session focused on increasing pt safety/independence with functional transfers. Performed multiple sit<>stand transfers from w/c with min A for sit>stand and min-mod A to control descent during stand>sit; cueing focused on safety awareness, awareness of LLE position during transfer. Per pt questions, reiterated PT goals and plan of care. Pt verbalized understanding and was in full agreement. Per pt request to sit in recliner, performed lateral scooting transfer from w/c > recliner with min A for LLE management (for adherence to LLE NWB). Departed with pt seated in recliner with bilat LE's elevated and all needs within reach.  Therapy Documentation Precautions:  Precautions Precautions: Back, Fall Required Braces or Orthoses: Spinal Brace Spinal Brace: Thoracolumbosacral orthotic, Applied in supine position Spinal Brace Comments: donn in supine Restrictions Weight Bearing Restrictions: Yes LLE Weight Bearing: Non weight bearing Other Position/Activity Restrictions: Discussed WB status at length. Reviewed back precautions Vital Signs: Therapy Vitals Temp: 97.8 F (36.6 C) Temp Source: Oral Pulse Rate: 68 Resp: 18 BP: 114/70 mmHg Patient Position (if appropriate): Sitting Oxygen Therapy SpO2: 94 % O2 Device: Not Delivered Pain: Pain Assessment Pain Assessment: No/denies pain Pain Score: 5  Pain Type: Acute pain Pain Location: Leg Pain Orientation: Left Pain Descriptors / Indicators: Spasm Pain Frequency: Intermittent Pain Onset: On-going Patients Stated Pain Goal: 3 Pain Intervention(s): Medication (See eMAR) Multiple Pain Sites: No Mobility: Bed  Mobility Bed Mobility: Left Sidelying to Sit;Sit to Sidelying Left;Right Sidelying to Sit;Sit to Sidelying Right;Rolling Left;Rolling Right Rolling Right: 5: Supervision Rolling Right Details: Verbal cues for precautions/safety Rolling Left: 5: Supervision Rolling Left Details: Verbal cues for precautions/safety Right Sidelying to Sit: 5: Supervision Right Sidelying to Sit Details: Verbal cues for precautions/safety Right Sidelying to Sit Details (indicate cue type and reason): increased pain to this side Left Sidelying to Sit: 5: Supervision Left Sidelying to Sit Details: Verbal cues for precautions/safety Sit to Sidelying Right: 5: Supervision Sit to Sidelying Right Details: Verbal cues for precautions/safety Sit to Sidelying Right Details (indicate cue type and reason): increased pain to this side Sit to Sidelying Left: 5: Supervision Sit to Sidelying Left Details: Verbal cues for precautions/safety Transfers Sit to Stand: 4: Min assist Sit to Stand Details: Manual facilitation for weight bearing;Verbal cues for precautions/safety Stand to Sit: 3: Mod assist Stand to Sit Details (indicate cue type and reason): Manual facilitation for weight shifting;Verbal cues for precautions/safety Stand to Sit Details: mod assist for controlled decent Locomotion : Ambulation Ambulation/Gait Assistance: 4: Min assist;3: Mod assist  Trunk/Postural Assessment : Cervical Assessment Cervical Assessment: Within Functional Limits Thoracic Assessment Thoracic Assessment: Within Functional Limits Lumbar Assessment Lumbar Assessment: Within Functional Limits Postural Control Postural Control: Deficits on evaluation  Balance: Balance Balance Assessed: Yes Static Standing Balance Static Standing - Balance Support: During functional activity;Bilateral upper extremity supported Static Standing - Level of Assistance: 4: Min assist Dynamic Standing Balance Dynamic Standing - Balance Support: During  functional activity Dynamic Standing - Level of Assistance: 4: Min assist (managing clothing around waist, alternating UE support)  See FIM for current functional status  Therapy/Group: Individual Therapy  Hobble, Malva Cogan 03/06/2014, 3:43 PM
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
## 2014-03-06 NOTE — Plan of Care (Signed)
Problem: RH BOWEL ELIMINATION Goal: RH STG MANAGE BOWEL WITH ASSISTANCE STG Manage Bowel with min Assistance.  Outcome: Progressing Goal: RH STG MANAGE BOWEL W/MEDICATION W/ASSISTANCE STG Manage Bowel with Medication with min Assistance.  Outcome: Progressing  Problem: RH SKIN INTEGRITY Goal: RH STG SKIN FREE OF INFECTION/BREAKDOWN Outcome: Progressing Goal: RH STG MAINTAIN SKIN INTEGRITY WITH ASSISTANCE STG Maintain Skin Integrity With min Assistance.  Outcome: Progressing  Problem: RH SAFETY Goal: RH STG ADHERE TO SAFETY PRECAUTIONS W/ASSISTANCE/DEVICE STG Adhere to Safety Precautions With min Assistance/Device.  Outcome: Progressing Goal: RH STG DECREASED RISK OF FALL WITH ASSISTANCE STG Decreased Risk of Fall With min Assistance.  Outcome: Progressing  Problem: RH PAIN MANAGEMENT Goal: RH STG PAIN MANAGED AT OR BELOW PT'S PAIN GOAL Less than 3 out of 10  Outcome: Progressing

## 2014-03-07 ENCOUNTER — Encounter (HOSPITAL_COMMUNITY): Payer: Self-pay

## 2014-03-07 ENCOUNTER — Inpatient Hospital Stay (HOSPITAL_COMMUNITY): Payer: Self-pay

## 2014-03-07 ENCOUNTER — Inpatient Hospital Stay (HOSPITAL_COMMUNITY): Payer: Medicare Other | Admitting: Speech Pathology

## 2014-03-07 ENCOUNTER — Inpatient Hospital Stay (HOSPITAL_COMMUNITY): Payer: Medicare Other | Admitting: *Deleted

## 2014-03-07 MED ORDER — SENNA 8.6 MG PO TABS
2.0000 | ORAL_TABLET | Freq: Every day | ORAL | Status: DC
  Administered 2014-03-07 – 2014-03-15 (×9): 17.2 mg via ORAL
  Filled 2014-03-07 (×11): qty 2

## 2014-03-07 MED ORDER — PANTOPRAZOLE SODIUM 40 MG PO PACK
40.0000 mg | PACK | Freq: Every day | ORAL | Status: DC
Start: 2014-03-07 — End: 2014-03-11
  Administered 2014-03-07 – 2014-03-11 (×5): 40 mg
  Filled 2014-03-07 (×6): qty 20

## 2014-03-07 MED ORDER — POTASSIUM CHLORIDE CRYS ER 20 MEQ PO TBCR
20.0000 meq | EXTENDED_RELEASE_TABLET | Freq: Two times a day (BID) | ORAL | Status: DC
  Administered 2014-03-07 – 2014-03-16 (×19): 20 meq via ORAL
  Filled 2014-03-07 (×22): qty 1

## 2014-03-07 MED ORDER — DOCUSATE SODIUM 50 MG/5ML PO LIQD
100.0000 mg | Freq: Two times a day (BID) | ORAL | Status: DC
  Administered 2014-03-07 – 2014-03-11 (×9): 100 mg via ORAL
  Filled 2014-03-07 (×11): qty 10

## 2014-03-07 NOTE — Progress Notes (Signed)
Occupational Therapy Session Note  Patient Details  Name: [name] [name] MRN: [id number] Date of Birth: Sep 22, 1950  Today's Date: 03/07/2014 OT Individual Time: 1030-1130 and 6979-4801 OT Individual Time Calculation (min): 60 min and 60 min     Short Term Goals: Week 1:  OT Short Term Goal 1 (Week 1): Pt will complete grooming task in standing with supervision for at least 1 min  OT Short Term Goal 2 (Week 1): Pt will complete toilet task at supervision level with mod cues OT Short Term Goal 3 (Week 1): Pt will complete LB dressing with min assist and use of AE   Skilled Therapeutic Interventions/Progress Updates:    Session 1: Pt seen for ADL retraining with focus on activity tolerance, adherence to precautions, use of AE, and functional transfers. Pt received sitting in recliner chair. Pt upset about being in hospital and missing home. Provided emotional support and pt willing to participate. Pt completed sponge bath from recliner chair with steadying assist for standing balance and min cues for adherence to precautions. Utilized reacher and sock-aid with min assist and increased time. Pt ambulated to toilet with min assist and mod cues for safety during turn change. Practiced donning/doffing TLSO 2x with pt requiring min assist to complete. Pt returned to recliner chair and left with all needs in reach.   Session 2: Pt seen for 1:1 OT session with focus on awareness, activity tolerance, functional transfers, and BUE strengthening. Pt propelled approx 120 feet with 3 rest breaks to increase activity tolerance and BUE strength. Therapist assisted to solarium where pt practiced navigating around chair and completed multiple furniture transfers with min assist. Emphasis on adherence to precautions and controlled descent. Pt propelled self back towards room in w/c approx 100 feet. Therapist provided drink and engaged in therapeutic conversation with focus on intellectual awareness, anticipatory  awareness, and goals of therapy. Pt continues to have impaired awareness and verbally perseverates on wanting to drink a "soda" after therapist explained swallowing precautions. Pt left sitting in w/c with all needs in reach.   Therapy Documentation Precautions:  Precautions Precautions: Back, Fall Required Braces or Orthoses: Spinal Brace Spinal Brace: Thoracolumbosacral orthotic, Applied in supine position Spinal Brace Comments: donn in supine Restrictions Weight Bearing Restrictions: Yes LLE Weight Bearing: Non weight bearing Other Position/Activity Restrictions: Discussed WB status at length. Reviewed back precautions General:   Vital Signs: Oxygen Therapy SpO2: 98 % O2 Device: Not Delivered Pain: No report of pain during therapy sessions.   See FIM for current functional status  Therapy/Group: Individual Therapy  Duayne Cal 03/07/2014, 11:42 AM

## 2014-03-07 NOTE — Progress Notes (Signed)
Speech Language Pathology Daily Session Note  Patient Details  Name: CAMDEN KNOTEK MRN: [id number] Date of Birth: 1950-12-19  Today's Date: 03/07/2014 SLP Individual Time: 0900-1000 SLP Individual Time Calculation (min): 60 min  Short Term Goals: Week 1: SLP Short Term Goal 1 (Week 1): Patient will utilize swallow compensatory strategies to minimize overt s/s of aspiration with Mod I.  SLP Short Term Goal 2 (Week 1): Patient will perform pharyngeal strengthening exercises with supervision multimodal cues.  SLP Short Term Goal 3 (Week 1): Patient will self-monitor and correct errors during functional tasks with Mod A multimodal cues.  SLP Short Term Goal 4 (Week 1): Patient will utilize external memory aids to recall new, daily information with Min A multimodal cues.  SLP Short Term Goal 5 (Week 1): Patient will demonstrate problem solving for mildly complex tasks with Min A multimodal cues.  SLP Short Term Goal 6 (Week 1): Patient will utilize call bell to request assistance with Mod I.   Skilled Therapeutic Interventions: Skilled treatment session focused on cognitive-linguistic goals. Student facilitated session by providing Min A multimodal cues for recall of previous medications and Mod-Max A multimodal cues for recall of current medications. Student also facilitated session by providing Min A multimodal cues for sustained attention and Mod A multimodal cues for problem solving during a mildly complex task of organizing a pill box. Patient required Min-Mod A multimodal cues for recall of events during previous therapy sessions and familiar phone numbers of family members. Patient demonstrated decreased frustration tolerance with tasks throughout the session, suspect due to decreased emergent awareness of deficits. Continue with current plan of care.    FIM:  Comprehension Comprehension Mode: Auditory Comprehension: 5-Understands basic 90% of the time/requires cueing < 10% of the  time Expression Expression Mode: Verbal Expression: 5-Expresses complex 90% of the time/cues < 10% of the time Social Interaction Social Interaction: 4-Interacts appropriately 75 - 89% of the time - Needs redirection for appropriate language or to initiate interaction. Problem Solving Problem Solving: 3-Solves basic 50 - 74% of the time/requires cueing 25 - 49% of the time Memory Memory: 3-Recognizes or recalls 50 - 74% of the time/requires cueing 25 - 49% of the time  Pain Pain Assessment Pain Assessment: No/denies pain  Therapy/Group: Individual Therapy  Hien Perreira 03/07/2014, 12:36 PM

## 2014-03-07 NOTE — Plan of Care (Signed)
Problem: RH SKIN INTEGRITY Goal: RH STG MAINTAIN SKIN INTEGRITY WITH ASSISTANCE STG Maintain Skin Integrity With min Assistance.  Outcome: Progressing  Problem: RH SAFETY Goal: RH STG ADHERE TO SAFETY PRECAUTIONS W/ASSISTANCE/DEVICE STG Adhere to Safety Precautions With min Assistance/Device.  Outcome: Progressing Goal: RH STG DECREASED RISK OF FALL WITH ASSISTANCE STG Decreased Risk of Fall With min Assistance.  Outcome: Progressing  Problem: RH COGNITION-NURSING Goal: RH STG ANTICIPATES NEEDS/CALLS FOR ASSIST W/ASSIST/CUES STG Anticipates Needs/Calls for min Assist With Assistance/Cues.  Outcome: Progressing  Problem: RH PAIN MANAGEMENT Goal: RH STG PAIN MANAGED AT OR BELOW PT'S PAIN GOAL Less than 3 out of 10  Outcome: Progressing
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
## 2014-03-07 NOTE — Plan of Care (Signed)
Problem: RH BOWEL ELIMINATION Goal: RH STG MANAGE BOWEL WITH ASSISTANCE STG Manage Bowel with min Assistance.  Outcome: Not Progressing No BM since 11/1 Goal: RH STG MANAGE BOWEL W/MEDICATION W/ASSISTANCE STG Manage Bowel with Medication with min Assistance.  Outcome: Progressing
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
## 2014-03-07 NOTE — Plan of Care (Signed)
Problem: RH BOWEL ELIMINATION Goal: RH STG MANAGE BOWEL WITH ASSISTANCE STG Manage Bowel with min Assistance.  LBM 11/1 Goal: RH STG MANAGE BOWEL W/MEDICATION W/ASSISTANCE STG Manage Bowel with Medication with min Assistance.  Outcome: Progressing LBM 11/1  Problem: RH SKIN INTEGRITY Goal: RH STG SKIN FREE OF INFECTION/BREAKDOWN Outcome: Progressing Goal: RH STG MAINTAIN SKIN INTEGRITY WITH ASSISTANCE STG Maintain Skin Integrity With min Assistance.  Outcome: Progressing  Problem: RH SAFETY Goal: RH STG ADHERE TO SAFETY PRECAUTIONS W/ASSISTANCE/DEVICE STG Adhere to Safety Precautions With min Assistance/Device.  Outcome: Progressing Goal: RH STG DECREASED RISK OF FALL WITH ASSISTANCE STG Decreased Risk of Fall With min Assistance.  Outcome: Progressing  Problem: RH COGNITION-NURSING Goal: RH STG ANTICIPATES NEEDS/CALLS FOR ASSIST W/ASSIST/CUES STG Anticipates Needs/Calls for min Assist With Assistance/Cues.  Outcome: Progressing  Problem: RH PAIN MANAGEMENT Goal: RH STG PAIN MANAGED AT OR BELOW PT'S PAIN GOAL Less than 3 out of 10  Outcome: Progressing

## 2014-03-07 NOTE — Care Management Note (Signed)
[facility] Individual Statement of Services  Patient Name:  BRALLAN DENIO  Date:  03/07/2014  Welcome to the [facility].  Our goal is to provide you with an individualized program based on your diagnosis and situation, designed to meet your specific needs.  With this comprehensive rehabilitation program, you will be expected to participate in at least 3 hours of rehabilitation therapies Monday-Friday, with modified therapy programming on the weekends.  Your rehabilitation program will include the following services:  Physical Therapy (PT), Occupational Therapy (OT), Speech Therapy (ST), 24 hour per day rehabilitation nursing, Therapeutic Recreaction (TR), Neuropsychology, Case Management (Social Worker), Rehabilitation Medicine, Nutrition Services and Pharmacy Services  Weekly team conferences will be held on Tuesdays to discuss your progress.  Your Social Worker will talk with you frequently to get your input and to update you on team discussions.  Team conferences with you and your family in attendance may also be held.  Expected length of stay: 12-14 days  Overall anticipated outcome: supervision/ minimal assist goals  Depending on your progress and recovery, your program may change. Your Social Worker will coordinate services and will keep you informed of any changes. Your Social Worker's name and contact numbers are listed  below.  The following services may also be recommended but are not provided by the [facility] will be made to provide these services after discharge if needed.  Arrangements include referral to agencies that provide these services.  Your insurance has been verified to be:  [employer] Your primary doctor is:  Dr. Arelia Sneddon  Pertinent information will be shared with  your doctor and your insurance company.  Social Worker:  [place], [place] or ([phone]   Information discussed with and copy given to patient by: Lennart Pall, 03/07/2014, 3:37 PM

## 2014-03-07 NOTE — Progress Notes (Signed)
Physical Therapy Session Note  Patient Details  Name: [name] [name] MRN: [id number] Date of Birth: Jan 27, 1951  Today's Date: 03/07/2014 PT Individual Time: 9381-8299 and 1600-1633 PT Individual Time Calculation (min): 60 min and 33 min  Short Term Goals: Week 1:  PT Short Term Goal 1 (Week 1): Pt to perform bed mobility in a standard bed at mod(I) level PT Short Term Goal 2 (Week 1): Pt to perform bed<>w/c transfers with supervision, 50% of time PT Short Term Goal 3 (Week 1): Pt to propel wheelchair 200' with supervision PT Short Term Goal 4 (Week 1): Pt to ambulate 25' with RW and min guard A PT Short Term Goal 5 (Week 1): Pt to negotiate up/down 1 curb step with mod A  Skilled Therapeutic Interventions/Progress Updates:    First session: Patient received sitting in wheelchair. Session focused on wheelchair mobility, functional transfers, and gait training. Wheelchair mobility >150' with B UEs and supervision, cues for sequencing/efficiency. Discussion/education about L LE NWB status as patient states he "just puts toes down to balance." Emphasized to patient NWB status for L LE means he cannot even rest toes on floor. Stand pivot transfers wheelchair<>mat with RW with L knee sling and minA, facilitation to maintain NWB during sit>stand. Gait training 5' x1 with RW and L knee sling and minA, progressing to min guard. Patient requires verbal cues for appropriate pacing and to maintain BOS within RW. Patient performed functional ambulation 120' x1 with roll-about and minA, cues for use of brakes and appropriate pacing. Discussion that roll-about would not be covered and patient agreeable to use of RW with knee sling. Seated L LE LAQ with 3" hold 2x10. Patient returned to room and left sitting in wheelchair with all needs within reach.  Second session: Patient received sitting in wheelchair. Session focused on wheelchair mobility, squat pivot transfers, and LE strengthening. Wheelchair mobility  >150' with B UEs and supervision, cues for sequencing/efficiency. Block practice squat pivot transfers x8 with emphasis on wheelchair parts management and set up for transfer; initially requires minA secondary to standing up all the way and for maintaining NWB L LE, then able to progress to supervision with mod cues to maintain NWB L LE. Patient returned to room and performed squat pivot transfer wheelchair>recliner with minA to maintain NWB L LE. Patient becoming mildly agitated, pushing wheelchair away stating, "I just hate wheelchairs." Patient then explaining how it took him "and a nurse 45 minutes to get the recliner right". Patient left sitting in recliner with all needs within reach.  Therapy Documentation Precautions:  Precautions Precautions: Back, Fall Required Braces or Orthoses: Spinal Brace Spinal Brace: Thoracolumbosacral orthotic, Applied in supine position Spinal Brace Comments: donn in supine Restrictions Weight Bearing Restrictions: Yes LLE Weight Bearing: Non weight bearing Other Position/Activity Restrictions: Discussed WB status at length. Reviewed back precautions Pain: Pain Assessment Pain Assessment: No/denies pain Pain Score: 4  Pain Type: Acute pain Pain Location: Rib cage Pain Descriptors / Indicators: Aching Pain Frequency: Constant Pain Onset: On-going Patients Stated Pain Goal: 3 Pain Intervention(s): Medication (See eMAR) Locomotion : Ambulation Ambulation/Gait Assistance: 4: Min assist Wheelchair Mobility Distance: 150   See FIM for current functional status  Therapy/Group: Individual Therapy  Lillia Abed. Lashun Mccants, PT, DPT 03/07/2014, 3:22 PM

## 2014-03-08 ENCOUNTER — Inpatient Hospital Stay (HOSPITAL_COMMUNITY): Payer: Medicare Other

## 2014-03-08 ENCOUNTER — Inpatient Hospital Stay (HOSPITAL_COMMUNITY): Payer: Medicare Other | Admitting: Physical Therapy

## 2014-03-08 ENCOUNTER — Inpatient Hospital Stay (HOSPITAL_COMMUNITY): Payer: Medicare Other | Admitting: *Deleted

## 2014-03-08 ENCOUNTER — Inpatient Hospital Stay (HOSPITAL_COMMUNITY): Payer: Medicare Other | Admitting: Speech Pathology

## 2014-03-08 ENCOUNTER — Inpatient Hospital Stay (HOSPITAL_COMMUNITY): Payer: Medicare Other | Admitting: Occupational Therapy

## 2014-03-08 DIAGNOSIS — R1312 Dysphagia, oropharyngeal phase: Secondary | ICD-10-CM

## 2014-03-08 DIAGNOSIS — S82852A Displaced trimalleolar fracture of left lower leg, initial encounter for closed fracture: Secondary | ICD-10-CM | POA: Diagnosis present

## 2014-03-08 DIAGNOSIS — S069X3S Unspecified intracranial injury with loss of consciousness of 1 hour to 5 hours 59 minutes, sequela: Secondary | ICD-10-CM

## 2014-03-08 MED ORDER — SODIUM CHLORIDE 0.9 % IV SOLN
INTRAVENOUS | Status: DC
  Administered 2014-03-09 – 2014-03-12 (×5): via INTRAVENOUS
  Filled 2014-03-08 (×6): qty 1000

## 2014-03-08 NOTE — Progress Notes (Signed)
Occupational Therapy Session Note  Patient Details  Name: [name] [name] MRN: [id number] Date of Birth: 1951/04/22  Today's Date: 03/08/2014 OT Individual Time: 1100-1200 OT Individual Time Calculation (min): 60 min    Short Term Goals: Week 1:  OT Short Term Goal 1 (Week 1): Pt will complete grooming task in standing with supervision for at least 1 min  OT Short Term Goal 2 (Week 1): Pt will complete toilet task at supervision level with mod cues OT Short Term Goal 3 (Week 1): Pt will complete LB dressing with min assist and use of AE   Skilled Therapeutic Interventions/Progress Updates:   Therapeutic activities with emphasis functional mobility using RW and knee sling, dynamic standing balance, awareness re-ed, and activity tolerance.   Pt received in w/c and educated on goals and methods planned for treatment after reorientation.   Pt ambulated to gym, 174', with supervision for safety.   Pt progressed to standing UE ergometry using Sci-Fit and tolerated 9 min continuous with random routine, level 2, and then sat to rest for 3 minutes prior to resuming ergometry for another 8 minutes.   Vitals were checked while pt rested and were within limits (HR 93 bpm, BP 116/88, 02 at 96%).   Pt required re-ed on need to don TLSO while supine but otherwise demonstrated awareness of safety during mobility and standing activity.   Following ergometry pt completed 2 games of GoBan, standing supported with RW during gameplay w/o evidence of fatigue or LOB.   Pt was escorted back to his room at end of session to remain in w/c with all needs within reach.      Therapy Documentation Precautions:  Precautions Precautions: Back, Fall Required Braces or Orthoses: Spinal Brace Spinal Brace: Thoracolumbosacral orthotic, Applied in supine position Spinal Brace Comments: donn in supine Restrictions Weight Bearing Restrictions: Yes LLE Weight Bearing: Non weight bearing Other Position/Activity Restrictions:  Discussed WB status at length. Reviewed back precautions  Pain: Pain Assessment Pain Score: 3   See FIM for current functional status  Therapy/Group: Individual Therapy  [place] 03/08/2014, 12:25 PM
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
## 2014-03-08 NOTE — Progress Notes (Signed)
Occupational Therapy Note  Patient Details  Name: [name] [name] MRN: [id number] Date of Birth: November 04, 1950  Today's Date: 03/08/2014 OT Missed Time: 70 Minutes Missed Time Reason: Patient unwilling/refused to participate without medical reason  Pt refusing OT session and appears to be agitated during this scheduled time. Pt missed 30 minutes this session.   [name] 03/08/2014, 3:27 PM
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
## 2014-03-08 NOTE — Progress Notes (Signed)
Physical Therapy Session Note  Patient Details  Name: [name] [name] MRN: [id number] Date of Birth: 02-26-1951  Today's Date: 03/08/2014 PT Individual Time: 3086-5784 PT Individual Time Calculation (min): 45 min   Short Term Goals: Week 1:  PT Short Term Goal 1 (Week 1): Pt to perform bed mobility in a standard bed at mod(I) level PT Short Term Goal 2 (Week 1): Pt to perform bed<>w/c transfers with supervision, 50% of time PT Short Term Goal 3 (Week 1): Pt to propel wheelchair 200' with supervision PT Short Term Goal 4 (Week 1): Pt to ambulate 25' with RW and min guard A PT Short Term Goal 5 (Week 1): Pt to negotiate up/down 1 curb step with mod A  Skilled Therapeutic Interventions/Progress Updates:    Gait Training: PT instructs pt in ambulation with RW and L knee sling x 55' + 75' req CGA-min A and verbal cues to slow down, stop the walker prior to hopping up to it (pt's tendency is to hop to walker while it is still moving).   Therapeutic Activity: PT instructs pt in sit to stand with RW req min A for balance and to maintain L LE NWB. Pt becomes slightly agitated when PT is managing pt's leg, stating that he can keep most of his weight off of it. PT explains that pt must keep all of his weight off his L leg in order for the bone to heal properly. PT educated pt that b/c it was his fibula that was fractured, he may not feel pain when he puts weight on it (pt reports he must not be hurting his leg since he doesn't feel pain if he puts a little weight on it).  PT instructs pt in stand-step transfer with RW and knee sling req min A and verbal cues for safety w/c to mat.  PT instructs pt in repeated sit to stand from edge of mat with RW and knee sling 3 x 10 reps from progressively lower mat height - focus is on maintaining L LE NWB and safe hand placement (push off the mat during stand, reach back for the mat during sit).   Therapeutic Exercise: PT instructs pt in open chain L LE strengthening  exercises to increase pt's ability to maintain NWB during sit to stand: hip flexion with knee bent, then hip flexion with knee straight: 3 x 20 reps each.   Pt becomes easily agitated when PT corrects pt's safety with ambulation, but PT is able to redirect pt and help him calm down. Cont per PT POC.   Therapy Documentation Precautions:  Precautions Precautions: Back, Fall Required Braces or Orthoses: Spinal Brace Spinal Brace: Thoracolumbosacral orthotic, Applied in supine position Spinal Brace Comments: donn in supine Restrictions Weight Bearing Restrictions: Yes LLE Weight Bearing: Non weight bearing Other Position/Activity Restrictions: Discussed WB status at length. Reviewed back precautions   Pain: Pain Assessment Pain Assessment: 0-10 Pain Score: 4  Pain Type: Acute pain Pain Location: Rib cage Pain Orientation: Left;Anterior Pain Descriptors / Indicators: Aching Pain Onset: On-going Pain Intervention(s): Rest;Repositioned Multiple Pain Sites: No  See FIM for current functional status  Therapy/Group: Individual Therapy  Iyauna Sing M 03/08/2014, 1:25 PM

## 2014-03-08 NOTE — Progress Notes (Signed)
Physical Therapy Session Note  Patient Details  Name: [name] [name] MRN: [id number] Date of Birth: 1951-02-27  Today's Date: 03/08/2014 PT Individual Time: 1345-1445 PT Individual Time Calculation (min): 60 min   Short Term Goals: Week 1:  PT Short Term Goal 1 (Week 1): Pt to perform bed mobility in a standard bed at mod(I) level PT Short Term Goal 2 (Week 1): Pt to perform bed<>w/c transfers with supervision, 50% of time PT Short Term Goal 3 (Week 1): Pt to propel wheelchair 200' with supervision PT Short Term Goal 4 (Week 1): Pt to ambulate 20' with RW and min guard A PT Short Term Goal 5 (Week 1): Pt to negotiate up/down 1 curb step with mod A  Skilled Therapeutic Interventions/Progress Updates:    Pt found sitting up in w/c in rehab gym finishing previous PT session. Session focused on improving strength, balance, transfers, functional ambulation and safety. Pt performed squat pivot w/c<>mat x3 with min guard and cues to decrease speed of transfer and sequencing. Lateral scoots to L and R sitting on EOM with cues to maintain NWB LLE.  Ambulation using RW with knee sling in hospital hallway and rehab apartment bedroom (on carpet) 49' x2 with min guard and cues for safety to decrease speed and to keep all legs of RW in contact with the ground at all times. Increased difficulty of STS by performing off a lower, less stable surface (rehab apartment couch), pt given min cues for safe use of walker. STS at EOM x10 with repeated cues to not "plop" onto mat, focused on controlling eccentric activity to sitting position. Horseshoe balance activity standing at EOM using RW for UE support with reach outside BOS to challenge balance. Pt performed activity with anterior/superior reach (x10) and lateral/inferior reach to R (x10). Pt performed knee extension sitting on EOM x 20 bilaterally, and L knee flexion in standing x20. Pt on NuStep with resistance of 5 for 10 min with LLE resting on bench. Pt left  in room sitting up in recliner with all needs in reach.  Therapy Documentation Precautions:  Precautions Precautions: Back, Fall Required Braces or Orthoses: Spinal Brace Spinal Brace: Thoracolumbosacral orthotic, Applied in supine position Spinal Brace Comments: donn in supine Restrictions Weight Bearing Restrictions: Yes LLE Weight Bearing: Non weight bearing Other Position/Activity Restrictions: Discussed WB status at length. Reviewed back precautions Pain: Pain Assessment Pain Assessment: 0-10 Pain Score: 4  Pain Type: Acute pain Pain Location: Rib cage Pain Orientation: Left;Anterior Pain Descriptors / Indicators: Aching Pain Onset: On-going Pain Intervention(s): Rest;Repositioned Multiple Pain Sites: No Locomotion : Ambulation Ambulation/Gait Assistance: 4: Min guard Wheelchair Mobility Distance: 150   See FIM for current functional status  Therapy/Group: Individual Therapy  Ritchie Klee, SPT 03/08/2014, 3:41 PM

## 2014-03-08 NOTE — Progress Notes (Signed)
Occupational Therapy Session Note  Patient Details  Name: [name] [name] MRN: [id number] Date of Birth: June 21, 1950  Today's Date: 03/08/2014 OT Individual Time: 7619-5093 OT Individual Time Calculation (min): 45 min    Short Term Goals: Week 1:  OT Short Term Goal 1 (Week 1): Pt will complete grooming task in standing with supervision for at least 1 min  OT Short Term Goal 2 (Week 1): Pt will complete toilet task at supervision level with mod cues OT Short Term Goal 3 (Week 1): Pt will complete LB dressing with min assist and use of AE   Skilled Therapeutic Interventions/Progress Updates:    Pt seen for ADL retraining with focus on functional transfers, standing balance, awareness, and adherence to precautions. Pt received sitting in recliner chair. Ambulated to bathroom with CGA to complete toilet transfer with min cues for safety with RW. Pt completed bathing and dressing sit<>stand from w/c at sink. Pt required max cues for adherence to precautions and awareness. Pt utilized reacher for LB dressing with increased time and required min assist to don TLSO. Pt stated "do you really think I'm going to wear this at home." Educated on importance of adhering to precautions and wearing spinal brace to allow for healing. Provided education on goals of therapy with explanation. Will continue to educate d/t impaired awareness. Pt completed sit<>stand at sink multiple times with min assist and mod cues to adhere to NWB precautions. At end of session pt returned to recliner chair and left with all needs in reach.   Therapy Documentation Precautions:  Precautions Precautions: Back, Fall Required Braces or Orthoses: Spinal Brace Spinal Brace: Thoracolumbosacral orthotic, Applied in supine position Spinal Brace Comments: donn in supine Restrictions Weight Bearing Restrictions: Yes LLE Weight Bearing: Non weight bearing Other Position/Activity Restrictions: Discussed WB status at length. Reviewed back  precautions General:   Vital Signs: Therapy Vitals Temp: 98.4 F (36.9 C) Temp Source: Oral Pulse Rate: 80 Resp: 18 BP: (!) 160/74 mmHg Patient Position (if appropriate): Lying Oxygen Therapy SpO2: 96 % O2 Device: Not Delivered Pain: No report of pain during therapy session  See FIM for current functional status  Therapy/Group: Individual Therapy  Duayne Cal 03/08/2014, 9:21 AM

## 2014-03-08 NOTE — Progress Notes (Signed)
Speech Language Pathology Daily Session Note  Patient Details  Name: [name] [name] MRN: [id number] Date of Birth: March 25, 1951  Today's Date: 03/08/2014 SLP Individual Time: 1000-1030 SLP Individual Time Calculation (min): 30 min  Short Term Goals: Week 1: SLP Short Term Goal 1 (Week 1): Patient will utilize swallow compensatory strategies to minimize overt s/s of aspiration with Mod I.  SLP Short Term Goal 2 (Week 1): Patient will perform pharyngeal strengthening exercises with supervision multimodal cues.  SLP Short Term Goal 3 (Week 1): Patient will self-monitor and correct errors during functional tasks with Mod A multimodal cues.  SLP Short Term Goal 4 (Week 1): Patient will utilize external memory aids to recall new, daily information with Min A multimodal cues.  SLP Short Term Goal 5 (Week 1): Patient will demonstrate problem solving for mildly complex tasks with Min A multimodal cues.  SLP Short Term Goal 6 (Week 1): Patient will utilize call bell to request assistance with Mod I.   Skilled Therapeutic Interventions: Skilled treatment session focused on dysphagia goals. SLP facilitated session by initiating education in regards to pharyngeal strengthening exercises and provided Max A multimodal cues for patient to perform pharyngeal strengthening exercises appropriately. Patient also required Max A multimodal cues to recall the exercises after a short delay. Patient continues to demonstrate a hoarse, breathy vocal quality and requires total A for emergent awareness of his current swallowing function. Continue with current plan of care.    FIM:  Comprehension Comprehension Mode: Auditory Comprehension: 5-Understands basic 90% of the time/requires cueing < 10% of the time Expression Expression Mode: Verbal Expression: 5-Expresses complex 90% of the time/cues < 10% of the time Social Interaction Social Interaction: 5-Interacts appropriately 90% of the time - Needs monitoring or  encouragement for participation or interaction. Problem Solving Problem Solving: 4-Solves basic 75 - 89% of the time/requires cueing 10 - 24% of the time Memory Memory: 3-Recognizes or recalls 50 - 74% of the time/requires cueing 25 - 49% of the time  Pain No/Denies Pain   Therapy/Group: Individual Therapy  Saraiya Kozma 03/08/2014, 3:57 PM

## 2014-03-08 NOTE — Plan of Care (Signed)
Problem: RH BOWEL ELIMINATION Goal: RH STG MANAGE BOWEL WITH ASSISTANCE STG Manage Bowel with min Assistance.  Outcome: Progressing Goal: RH STG MANAGE BOWEL W/MEDICATION W/ASSISTANCE STG Manage Bowel with Medication with min Assistance.  Outcome: Progressing  Problem: RH SKIN INTEGRITY Goal: RH STG MAINTAIN SKIN INTEGRITY WITH ASSISTANCE STG Maintain Skin Integrity With min Assistance.  Outcome: Progressing  Problem: RH SAFETY Goal: RH STG ADHERE TO SAFETY PRECAUTIONS W/ASSISTANCE/DEVICE STG Adhere to Safety Precautions With min Assistance/Device.  Outcome: Progressing Goal: RH STG DECREASED RISK OF FALL WITH ASSISTANCE STG Decreased Risk of Fall With min Assistance.  Outcome: Progressing  Problem: RH COGNITION-NURSING Goal: RH STG ANTICIPATES NEEDS/CALLS FOR ASSIST W/ASSIST/CUES STG Anticipates Needs/Calls for min Assist With Assistance/Cues.  Outcome: Progressing  Problem: RH PAIN MANAGEMENT Goal: RH STG PAIN MANAGED AT OR BELOW PT'S PAIN GOAL Less than 3 out of 10  Outcome: Progressing

## 2014-03-08 NOTE — Progress Notes (Signed)
Recreational Therapy Session Note  Patient Details  Name: [name] [name] MRN: [id number] Date of Birth: 31-Dec-1950 Today's Date: 03/08/2014  Pt not yet appropriate for TR services, placed on HOLD at this time.  Will continue to monitor through team for future participation.  [place] 03/08/2014, 4:10 PM

## 2014-03-09 ENCOUNTER — Inpatient Hospital Stay (HOSPITAL_COMMUNITY): Payer: Medicare Other | Admitting: *Deleted

## 2014-03-09 ENCOUNTER — Inpatient Hospital Stay (HOSPITAL_COMMUNITY): Payer: Medicare Other | Admitting: Occupational Therapy

## 2014-03-09 ENCOUNTER — Inpatient Hospital Stay (HOSPITAL_COMMUNITY): Payer: Medicare Other | Admitting: Speech Pathology

## 2014-03-09 DIAGNOSIS — S82852G Displaced trimalleolar fracture of left lower leg, subsequent encounter for closed fracture with delayed healing: Secondary | ICD-10-CM

## 2014-03-09 DIAGNOSIS — S32010G Wedge compression fracture of first lumbar vertebra, subsequent encounter for fracture with delayed healing: Secondary | ICD-10-CM

## 2014-03-09 NOTE — Plan of Care (Signed)
Problem: RH BOWEL ELIMINATION Goal: RH STG MANAGE BOWEL WITH ASSISTANCE STG Manage Bowel with min Assistance.  Outcome: Progressing Goal: RH STG MANAGE BOWEL W/MEDICATION W/ASSISTANCE STG Manage Bowel with Medication with min Assistance.  Outcome: Progressing  Problem: RH SKIN INTEGRITY Goal: RH STG SKIN FREE OF INFECTION/BREAKDOWN Outcome: Progressing Goal: RH STG MAINTAIN SKIN INTEGRITY WITH ASSISTANCE STG Maintain Skin Integrity With min Assistance.  Outcome: Progressing  Problem: RH SAFETY Goal: RH STG ADHERE TO SAFETY PRECAUTIONS W/ASSISTANCE/DEVICE STG Adhere to Safety Precautions With min Assistance/Device.  Outcome: Progressing Goal: RH STG DECREASED RISK OF FALL WITH ASSISTANCE STG Decreased Risk of Fall With min Assistance.  Outcome: Progressing  Problem: RH PAIN MANAGEMENT Goal: RH STG PAIN MANAGED AT OR BELOW PT'S PAIN GOAL Less than 3 out of 10  Outcome: Progressing

## 2014-03-09 NOTE — Progress Notes (Addendum)
Physical Therapy Session Note  Patient Details  Name: [name] [name] MRN: [id number] Date of Birth: Feb 12, 1951  Today's Date: 03/09/2014 PT Individual Time: 1100-1200 PT Individual Time Calculation (min): 60 min   Short Term Goals: Week 1:  PT Short Term Goal 1 (Week 1): Pt to perform bed mobility in a standard bed at mod(I) level PT Short Term Goal 2 (Week 1): Pt to perform bed<>w/c transfers with supervision, 50% of time PT Short Term Goal 3 (Week 1): Pt to propel wheelchair 200' with supervision PT Short Term Goal 4 (Week 1): Pt to ambulate 57' with RW and min guard A PT Short Term Goal 5 (Week 1): Pt to negotiate up/down 1 curb step with mod A  Skilled Therapeutic Interventions/Progress Updates:    Patient received sitting in recliner. Session focused on adherence to all precautions with functional mobility and discharge planning/discussion. Patient performed gait training in controlled environment 175' x1 with min guard progressing to close supervision; however, patient requires therapist to stabilize RW to modify patient's pace appropriately as patient does not respond to verbal cues to do so.  Discussion about use of knee sling and importance of it for energy conservation and maintaining NWB through L LE. Discussed possibility of paying for one out of pocket if insurance does not cover. Also discussed one STE patient's sister's home and various options on how to perform. Patient ascended one curb step (to simulate one STE sister's home) with RW and modA, patient using L LE TDWB to maintain balance. Patient grossly unsafe with RW management and balance due to his impulsivity when performing. In order to descend, wheelchair placed at bottom of one step and patient uses RW to descend step backwards then immediately sit in wheelchair so he does not have to balance on one leg and manage RW. Discussed with patient, and patient in agreement, that this will be safest option for negotiation of one  step. Patient declined to attempt during this session, therefore, demonstrated how patient will perform (wheelchair/chair placed on step when entering, patient ascends backwards using RW and sits in chair leaving RW on ground; wheelchair/chair placed on ground when exiting and patient descends backwards using RW and sits in chair leaving RW on step).   Wheelchair mobility in ADL apartment to simulate home environment (carpet and tile) x25' with supervision. Demonstration and return demonstration of all wheelchair parts management (brakes, elevating leg rests, and arm rests), min cues overall. Upon returning to room, patient with request to use bathroom, minA overall for transfer wheelchair<>toilet with use of grab bar and standing balance while patient independently manages clothing.  Patient continues to demonstrate deficits with emergent awareness affecting his ability to maintain precautions and perform functional mobility safely. Today, patient stated he didn't plan on using RW upon discharge and required cues about NWB precautions and duration for several weeks. Additionally, patient initially stating he would not need a wheelchair upon discharge, until provided with scenarios of when a wheelchair would be essential to have.  Therapy Documentation Precautions:  Precautions Precautions: Back, Fall Required Braces or Orthoses: Spinal Brace Spinal Brace: Thoracolumbosacral orthotic, Applied in supine position Spinal Brace Comments: donn in supine Restrictions Weight Bearing Restrictions: Yes LLE Weight Bearing: Non weight bearing Other Position/Activity Restrictions: Discussed WB status at length. Reviewed back precautions Pain: Pain Assessment Pain Assessment: No/denies pain Pain Score: 0-No pain Locomotion : Ambulation Ambulation/Gait Assistance: 4: Min guard;5: Supervision Wheelchair Mobility Distance: 150   See FIM for current functional status  Therapy/Group: Individual  Therapy  Lillia Abed. Lavana Huckeba, PT, DPT 03/09/2014, 12:16 PM

## 2014-03-09 NOTE — Progress Notes (Signed)
Occupational Therapy Session Notes  Patient Details  Name: KEYONTAY STOLZ MRN: [id number] Date of Birth: 27-Mar-1951  Today's Date: 03/09/2014 OT Individual Time: 0900-0950 and 100-130 OT Individual Time Calculation (min): 50 min and 30 min  Short Term Goals: Week 1:  OT Short Term Goal 1 (Week 1): Pt will complete grooming task in standing with supervision for at least 1 min  OT Short Term Goal 2 (Week 1): Pt will complete toilet task at supervision level with mod cues OT Short Term Goal 3 (Week 1): Pt will complete LB dressing with min assist and use of AE   Skilled Therapeutic Interventions/Progress Updates:  1)  Patient resting in recliner upon arrival with brace on.  Engaged in self care retraining to include sponge bath at sink in standing for LB and supine/rolling for UB.  Focused session on adhering to back precautions, activity tolerance, attention, awareness, and memory.  Patient able to state that he could only stand if he did not put weight on his LLE and while standing he was able to monitor this with only 1vc during LB bathing while standing at the sink.  Recliner>bed with RW and knee sling for stand hop transfer.  Patient required vcs ~50% to log roll and ben knees before begin the roll.  Patient with significant rib pain when rolling.  Back brace very challenging to doff/donn in supine.  Patient required multiple cues to understand that the doctor's order is to complete this task in bed.  2)  Patient resting in recliner upon arrival with brace on.  RN requested to take a look at dressings on patient's right side therefore assisted patient back to bed and roll to remove brace and pull up shirt.  Patient again requires mod cues for log rolling techniques to include bend knees before he reaches for bed rail to roll.  After dressing change, patient ambulated with RW and left knee sling to and from toilet for toilet transfer using grab bar and elevated commode.  Patient initially stated that  he wanted to use the w/c for this task however understood the need to practice this incase the w/c would not fit through the bathroom door at home.  Patient with increased weight through LLE 1 time during clothing management after toileting.  When this was brought to his attention, he states, "no, I was just resting the foot".  This OT demonstrated that if the foot does not easily move when nudged, there is too much weight on it.    Therapy Documentation Precautions:  Precautions Precautions: Back, Fall Required Braces or Orthoses: Spinal Brace Spinal Brace: Thoracolumbosacral orthotic, Applied in supine position Spinal Brace Comments: donn in supine Restrictions Weight Bearing Restrictions: Yes LLE Weight Bearing: Non weight bearing Other Position/Activity Restrictions: Discussed WB status at length. Reviewed back precautions Pain: 1) 0/10-4/10, pain in bilateral ribs increases with bed mobility. Rest, repositioned 2) pain in ribs with bed mobility, not rated, rest, repositioned. ADL: See FIM for current functional status  Therapy/Group: Individual Therapy  Omarian Jaquith 03/09/2014, 7:48 AM

## 2014-03-09 NOTE — Plan of Care (Signed)
Problem: RH BOWEL ELIMINATION Goal: RH STG MANAGE BOWEL WITH ASSISTANCE STG Manage Bowel with min Assistance.  Outcome: Progressing Goal: RH STG MANAGE BOWEL W/MEDICATION W/ASSISTANCE STG Manage Bowel with Medication with min Assistance.  Outcome: Progressing  Problem: RH SKIN INTEGRITY Goal: RH STG SKIN FREE OF INFECTION/BREAKDOWN Outcome: Progressing Goal: RH STG MAINTAIN SKIN INTEGRITY WITH ASSISTANCE STG Maintain Skin Integrity With min Assistance.  Outcome: Progressing  Problem: RH SAFETY Goal: RH STG ADHERE TO SAFETY PRECAUTIONS W/ASSISTANCE/DEVICE STG Adhere to Safety Precautions With min Assistance/Device.  Outcome: Progressing Goal: RH STG DECREASED RISK OF FALL WITH ASSISTANCE STG Decreased Risk of Fall With min Assistance.  Outcome: Progressing  Problem: RH COGNITION-NURSING Goal: RH STG ANTICIPATES NEEDS/CALLS FOR ASSIST W/ASSIST/CUES STG Anticipates Needs/Calls for min Assist With Assistance/Cues.  Outcome: Progressing  Problem: RH PAIN MANAGEMENT Goal: RH STG PAIN MANAGED AT OR BELOW PT'S PAIN GOAL Less than 3 out of 10  Outcome: Progressing

## 2014-03-09 NOTE — Progress Notes (Signed)
Speech Language Pathology Daily Session Note  Patient Details  Name: [name] [name] MRN: [id number] Date of Birth: 1951-01-07  Today's Date: 03/09/2014 SLP Individual Time: 0211-1552 SLP Individual Time Calculation (min): 45 min  Short Term Goals: Week 1: SLP Short Term Goal 1 (Week 1): Patient will utilize swallow compensatory strategies to minimize overt s/s of aspiration with Mod I.  SLP Short Term Goal 2 (Week 1): Patient will perform pharyngeal strengthening exercises with supervision multimodal cues.  SLP Short Term Goal 3 (Week 1): Patient will self-monitor and correct errors during functional tasks with Mod A multimodal cues.  SLP Short Term Goal 4 (Week 1): Patient will utilize external memory aids to recall new, daily information with Min A multimodal cues.  SLP Short Term Goal 5 (Week 1): Patient will demonstrate problem solving for mildly complex tasks with Min A multimodal cues.  SLP Short Term Goal 6 (Week 1): Patient will utilize call bell to request assistance with Mod I.   Skilled Therapeutic Interventions: Skilled treatment session focused on dysphagia goals. SLP facilitated session by providing skilled observation with breakfast meal of Dys. 1 textures and pudding thick liquids. Patient demonstrated an intermittent wet vocal quality throughout the meal, suspect due to large bites and decreased utilization of multiple swallows despite Max A multimodal cues.  Patient also required Mod A multimodal cues to recall pharyngeal strengthening exercises from previous therapy session and to perform them appropriately. Patient continues to demonstrate a hoarse, breathy vocal quality and requires total A for emergent awareness of his current swallowing function. Continue with current plan of care.   FIM:  Comprehension Comprehension: 5-Understands basic 90% of the time/requires cueing < 10% of the time Expression Expression: 5-Expresses complex 90% of the time/cues < 10% of the  time Social Interaction Social Interaction: 5-Interacts appropriately 90% of the time - Needs monitoring or encouragement for participation or interaction. Problem Solving Problem Solving: 4-Solves basic 75 - 89% of the time/requires cueing 10 - 24% of the time Memory Memory: 3-Recognizes or recalls 50 - 74% of the time/requires cueing 25 - 49% of the time FIM - Eating Eating Activity: 5: Supervision/cues;6: Modified consistency diet: (comment)  Pain No/Denies Pain   Therapy/Group: Individual Therapy  Kailynn Satterly 03/09/2014, 9:54 AM

## 2014-03-09 NOTE — Progress Notes (Signed)
Noted patient has suture to Rt chest under brace. Patient also has allevyn x 2 over abrasions under back brace.

## 2014-03-09 NOTE — Plan of Care (Signed)
Problem: RH Car Transfers Goal: LTG Patient will perform car transfers with assist (PT) LTG: Patient will perform car transfers with assistance (PT).  goal upgraded 03/09/14 due to progress  Problem: RH Ambulation Goal: LTG Patient will ambulate in controlled environment (PT) LTG: Patient will ambulate in a controlled environment, # of feet with assistance (PT).  goal upgraded 03/09/14 due to progress

## 2014-03-09 NOTE — Progress Notes (Signed)
Patient ID: [name] [name], male   DOB: 1950-08-12, 63 y.o.   MRN: [id number]    [facility]     PROGRESS NOTE   03/09/14.  Subjective/Complaints:  63 y/o admit for CIR with  functional deficits secondary to Left trimalleolar fx, L1 compression fx, TBI (?anoxic injury) Alert, no c/os  A  review of systems has been performed and if not noted above is otherwise negative.  Past Medical History  Diagnosis Date  . Alcohol abuse   . Anemia   . Hyperlipidemia   . Cirrhosis, alcoholic     last etoh 0/86-[facility] dr Henrene Pastor  . Arthritis   . Sleep apnea     had surgery to correct snoring 2001  . Hypertension   . GERD (gastroesophageal reflux disease)   . CHF (congestive heart failure)   . H/O sleep apnea   . Cirrhosis of liver   . Esophageal varices   . Colon polyps    Patient Vitals for the past 24 hrs:  BP Temp Temp src Pulse Resp SpO2 Weight  03/09/14 0500 - - - - - - 203 lb (92.08 kg)  03/09/14 0449 (!) 164/70 mmHg 98.5 F (36.9 C) Oral 76 18 97 % -  03/08/14 1900 - - - - - - 200 lb 4.8 oz (90.855 kg)  03/08/14 1430 (!) 135/51 mmHg 98.6 F (37 C) Oral 75 16 96 % -     Intake/Output Summary (Last 24 hours) at 03/09/14 0933 Last data filed at 03/09/14 0800  Gross per 24 hour  Intake    240 ml  Output      0 ml  Net    240 ml     Objective: Vital Signs: Blood pressure 164/70, pulse 76, temperature 98.5 F (36.9 C), temperature source Oral, resp. rate 18, weight 203 lb (92.08 kg), SpO2 97 %. No results found.  Recent Labs  03/06/14 1305  WBC 6.1  HGB 9.3*  HCT 29.4*  PLT 264    Recent Labs  03/06/14 1305  NA 140  K 3.4*  CL 102  GLUCOSE 132*  BUN 21  CREATININE 0.60  CALCIUM 8.8   CBG (last 3)  No results for input(s): GLUCAP in the last 72 hours.  Wt Readings from Last 3 Encounters:  03/09/14 203 lb (92.08 kg)  03/04/14 201 lb 11.5 oz (91.499 kg)  10/14/12 198 lb 3.2 oz (89.903 kg)    Physical Exam:   Constitutional: He is oriented to person, place, and time. He appears well-developed and well-nourished.  Sitting in chair with TLSO in place.  HENT: oral mucosa pink Head: Normocephalic and atraumatic.  Eyes: Conjunctivae are normal. Pupils are equal, round, and reactive to light.  Neck: Normal range of motion. Neck supple.  Cardiovascular: Normal rate and regular rhythm.no murmurs  Respiratory: Effort normal and breath sounds normal. He has no wheezes.  GI: Soft. Bowel sounds are normal. He exhibits no distension. There is no tenderness.  Musculoskeletal:  LLE with soft cast.  Neurological: He is alert and oriented to person, place, and time.   Skin: Skin is warm and dry.  Pysch: pleasant, non-agitated  AMedical Problem List and Plan: 1. 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 DVT Prophylaxis/Anticoagulation: lovenox, admission dopplers 3. Pain Management: hydrocodone and robaxin effective at present  -continue TLSO as well 4. Mood: Provide ego support. Has been expressing stress about inability to care for wife with metastatic  cancer. LCSW to follow for evaluation and support.  5. Neuropsych: This patient is not capable of making decisions on his own behalf. 6. Skin/Wound Care: Routine pressure relief measures. Rehab RN to monitor skin daily.  7. Fluids/Electrolytes/Nutrition: Monitor lytes routinely due to thickened liquids. Adjust IVF as indicated.  8. L1- compression fracture: Back precautions. TLSO to be donned when supine. 9. Left Trimalleolar ankle fracture s/p ORIF and pinning of left great toe: NWB LLE 10. HTN: Will monitor every 8 hours. Continue lasix.    -IS LOS (Days) 4 A FACE TO FACE EVALUATION WAS PERFORMED  Nyoka Cowden 03/09/2014 9:31 AM

## 2014-03-10 ENCOUNTER — Inpatient Hospital Stay (HOSPITAL_COMMUNITY): Payer: Medicare Other

## 2014-03-10 DIAGNOSIS — S32010D Wedge compression fracture of first lumbar vertebra, subsequent encounter for fracture with routine healing: Secondary | ICD-10-CM

## 2014-03-10 DIAGNOSIS — S82852F Displaced trimalleolar fracture of left lower leg, subsequent encounter for open fracture type IIIA, IIIB, or IIIC with routine healing: Secondary | ICD-10-CM

## 2014-03-10 NOTE — Progress Notes (Signed)
Physical Therapy Session Note  Patient Details  Name: [name] [name] MRN: [id number] Date of Birth: 06/15/1950  Today's Date: 03/10/2014 PT Individual Time: 1100-1130 PT Individual Time Calculation (min): 30 min   Short Term Goals: Week 1:  PT Short Term Goal 1 (Week 1): Pt to perform bed mobility in a standard bed at mod(I) level PT Short Term Goal 2 (Week 1): Pt to perform bed<>w/c transfers with supervision, 50% of time PT Short Term Goal 3 (Week 1): Pt to propel wheelchair 200' with supervision PT Short Term Goal 4 (Week 1): Pt to ambulate 60' with RW and min guard A PT Short Term Goal 5 (Week 1): Pt to negotiate up/down 1 curb step with mod A  Skilled Therapeutic Interventions/Progress Updates:    Pt received seated in recliner, agreeable to participate in therapy. Pt able to recall WB status and 2/3 back precautions independently, 3/3 w/ min cueing. Pt moved sit>stand w/ MinA, ambulated 150' to rehab gym w/ RW and L knee sling w/ MinGuard-supervision. Pt stated that he had told his therapist incorrectly about entry to sister's house and that she actually has level entry. Worked on standing balance at edge of mat w/ 0 UE support w/ knee sling on L, 1 UE support w/o knee sling on L. Pt required CGA-SBA to maintain standing balance. x15 LAQ w/ emphasis on slow, controlled movements on L. Pt ambulated 150' back to room w/ CGA w/ RW and L knee sling. Pt left seated in recliner w/ all needs within reach. Pt asked if he was able to stand w/ RW in room alone if he wasn't moving anywhere. Pt educated on risk of fall during sit<>stand transfer and need for supervision in order to ensure good setup. Pt agreed to use call button when he needed to stand.   Therapy Documentation Precautions:  Precautions Precautions: Back, Fall Required Braces or Orthoses: Spinal Brace Spinal Brace: Thoracolumbosacral orthotic, Applied in supine position Spinal Brace Comments: donn in supine Restrictions Weight  Bearing Restrictions: Yes LLE Weight Bearing: Non weight bearing Other Position/Activity Restrictions: Discussed WB status at length. Reviewed back precautions General:   Vital Signs: Therapy Vitals Temp: 98.1 F (36.7 C) Temp Source: Oral Pulse Rate: 78 Resp: 18 BP: (!) 167/82 mmHg Patient Position (if appropriate): Sitting Oxygen Therapy SpO2: 96 % O2 Device: Not Delivered Pain: Pain Assessment Pain Assessment: 0-10 Pain Score: 5  Pain Type: Acute pain Pain Location: Rib cage Pain Orientation: Left Pain Descriptors / Indicators: Aching Pain Onset: Gradual Pain Intervention(s): Medication (See eMAR) Mobility:   Locomotion :    Trunk/Postural Assessment :    Balance:   Exercises:   Other Treatments:    See FIM for current functional status  Therapy/Group: Individual Therapy  Rada Hay  Rada Hay, PT, DPT 03/10/2014, 7:52 AM

## 2014-03-10 NOTE — Progress Notes (Signed)
Patient ID: [name] [name], male   DOB: 1950-06-29, 63 y.o.   MRN: [id number]  Patient ID: [name] [name], male   DOB: Jan 13, 1951, 63 y.o.   MRN: [id number]    [facility]IL[facility]     PROGRESS NOTE   03/10/14.  Subjective/Complaints:  63 y/o admit for CIR with  functional deficits secondary to Left trimalleolar fx, L1 compression fx, TBI (?anoxic injury) Alert, no c/os.  Comfortable night-sleeps in recliner  A  review of systems has been performed and if not noted above is otherwise negative.  Past Medical History  Diagnosis Date  . Alcohol abuse   . Anemia   . Hyperlipidemia   . Cirrhosis, alcoholic     last etoh 3/29-[facility] dr Henrene Pastor  . Arthritis   . Sleep apnea     had surgery to correct snoring 2001  . Hypertension   . GERD (gastroesophageal reflux disease)   . CHF (congestive heart failure)   . H/O sleep apnea   . Cirrhosis of liver   . Esophageal varices   . Colon polyps    Patient Vitals for the past 24 hrs:  BP Temp Temp src Pulse Resp SpO2 Weight  03/10/14 0443 (!) 167/82 mmHg 98.1 F (36.7 C) Oral 78 18 96 % 201 lb 14.4 oz (91.581 kg)  03/09/14 1942 (!) 158/78 mmHg - - 76 - - -  03/09/14 1628 (!) 174/62 mmHg 98.3 F (36.8 C) Oral 63 18 96 % -     Intake/Output Summary (Last 24 hours) at 03/10/14 0828 Last data filed at 03/10/14 0809  Gross per 24 hour  Intake    500 ml  Output    150 ml  Net    350 ml     Objective: Vital Signs: Blood pressure 167/82, pulse 78, temperature 98.1 F (36.7 C), temperature source Oral, resp. rate 18, weight 201 lb 14.4 oz (91.581 kg), SpO2 96 %. No results found. No results for input(s): WBC, HGB, HCT, PLT in the last 72 hours. No results for input(s): NA, K, CL, GLUCOSE, BUN, CREATININE, CALCIUM in the last 72 hours.  Invalid input(s): CO CBG (last 3)  No results for input(s): GLUCAP in the last 72 hours.  Wt Readings from Last 3 Encounters:  03/10/14 201 lb 14.4 oz (91.581 kg)  03/04/14 201  lb 11.5 oz (91.499 kg)  10/14/12 198 lb 3.2 oz (89.903 kg)    Physical Exam:  Constitutional: He is oriented to person, place, and time. He appears well-developed and well-nourished.  Sitting in chair with TLSO in place.  HENT: oral mucosa pink Head: Normocephalic and atraumatic.  Eyes: Conjunctivae are normal. Pupils are equal, round, and reactive to light.  Neck: Normal range of motion. Neck supple.  Cardiovascular: Normal rate and regular rhythm.no murmurs  Respiratory: Effort normal and breath sounds normal. He has no wheezes.  GI: Soft. Bowel sounds are normal. He exhibits no distension. There is no tenderness.  Musculoskeletal:  LLE with soft cast.  Neurological: He is alert and oriented to person, place, and time.   Skin: Skin is warm and dry.  Pysch: pleasant, non-agitated  AMedical Problem List and Plan: 1. 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 DVT Prophylaxis/Anticoagulation: lovenox, admission dopplers 3. Pain Management: hydrocodone and robaxin effective at present  -continue TLSO as well 4. Mood: Provide ego support. Has been expressing stress about inability to care for wife with metastatic cancer. LCSW to  follow for evaluation and support.  5. Neuropsych: This patient is not capable of making decisions on his own behalf. 6. Skin/Wound Care: Routine pressure relief measures. Rehab RN to monitor skin daily.  7. Fluids/Electrolytes/Nutrition: Monitor lytes routinely due to thickened liquids. Adjust IVF as indicated.  8. L1- compression fracture: Back precautions. TLSO to be donned when supine. 9. Left Trimalleolar ankle fracture s/p ORIF and pinning of left great toe: NWB LLE 10. HTN: Will monitor every 8 hours. Continue lasix.    -IS LOS (Days) 5 A FACE TO FACE EVALUATION WAS PERFORMED  Nyoka Cowden 03/10/2014 8:28 AM

## 2014-03-10 NOTE — Plan of Care (Signed)
Problem: RH BOWEL ELIMINATION Goal: RH STG MANAGE BOWEL WITH ASSISTANCE STG Manage Bowel with min Assistance.  Outcome: Progressing Goal: RH STG MANAGE BOWEL W/MEDICATION W/ASSISTANCE STG Manage Bowel with Medication with min Assistance.  Outcome: Progressing  Problem: RH SKIN INTEGRITY Goal: RH STG SKIN FREE OF INFECTION/BREAKDOWN Outcome: Progressing Goal: RH STG MAINTAIN SKIN INTEGRITY WITH ASSISTANCE STG Maintain Skin Integrity With min Assistance.  Outcome: Progressing  Problem: RH SAFETY Goal: RH STG ADHERE TO SAFETY PRECAUTIONS W/ASSISTANCE/DEVICE STG Adhere to Safety Precautions With min Assistance/Device.  Outcome: Progressing Goal: RH STG DECREASED RISK OF FALL WITH ASSISTANCE STG Decreased Risk of Fall With min Assistance.  Outcome: Progressing  Problem: RH COGNITION-NURSING Goal: RH STG ANTICIPATES NEEDS/CALLS FOR ASSIST W/ASSIST/CUES STG Anticipates Needs/Calls for min Assist With Assistance/Cues.  Outcome: Progressing

## 2014-03-11 ENCOUNTER — Inpatient Hospital Stay (HOSPITAL_COMMUNITY): Payer: Medicare Other | Admitting: Speech Pathology

## 2014-03-11 ENCOUNTER — Encounter (HOSPITAL_COMMUNITY): Payer: Self-pay

## 2014-03-11 ENCOUNTER — Inpatient Hospital Stay (HOSPITAL_COMMUNITY): Payer: Medicare Other

## 2014-03-11 ENCOUNTER — Ambulatory Visit (HOSPITAL_COMMUNITY): Payer: Self-pay

## 2014-03-11 MED ORDER — DOCUSATE SODIUM 50 MG/5ML PO LIQD
100.0000 mg | Freq: Two times a day (BID) | ORAL | Status: DC
  Administered 2014-03-11 – 2014-03-15 (×9): 100 mg
  Filled 2014-03-11 (×12): qty 10

## 2014-03-11 MED ORDER — DOCUSATE SODIUM 100 MG PO CAPS
100.0000 mg | ORAL_CAPSULE | Freq: Two times a day (BID) | ORAL | Status: DC
  Filled 2014-03-11 (×2): qty 1

## 2014-03-11 MED ORDER — METOPROLOL TARTRATE 12.5 MG HALF TABLET
12.5000 mg | ORAL_TABLET | Freq: Two times a day (BID) | ORAL | Status: DC
  Administered 2014-03-11 – 2014-03-16 (×10): 12.5 mg via ORAL
  Filled 2014-03-11 (×12): qty 1

## 2014-03-11 MED ORDER — PANTOPRAZOLE SODIUM 40 MG PO TBEC
40.0000 mg | DELAYED_RELEASE_TABLET | Freq: Every day | ORAL | Status: DC
  Administered 2014-03-12 – 2014-03-16 (×5): 40 mg via ORAL
  Filled 2014-03-11 (×5): qty 1

## 2014-03-11 NOTE — Progress Notes (Signed)
Speech Language Pathology Daily Session Note  Patient Details  Name: [name] [name] MRN: [id number] Date of Birth: [birth year]  Today's Date: 03/11/2014 SLP Individual Time: 0930-1000 SLP Individual Time Calculation (min): 30 min  Short Term Goals: Week 1: SLP Short Term Goal 1 (Week 1): Patient will utilize swallow compensatory strategies to minimize overt s/s of aspiration with Mod I.  SLP Short Term Goal 2 (Week 1): Patient will perform pharyngeal strengthening exercises with supervision multimodal cues.  SLP Short Term Goal 3 (Week 1): Patient will self-monitor and correct errors during functional tasks with Mod A multimodal cues.  SLP Short Term Goal 4 (Week 1): Patient will utilize external memory aids to recall new, daily information with Min A multimodal cues.  SLP Short Term Goal 5 (Week 1): Patient will demonstrate problem solving for mildly complex tasks with Min A multimodal cues.  SLP Short Term Goal 6 (Week 1): Patient will utilize call bell to request assistance with Mod I.   Skilled Therapeutic Interventions: Skilled treatment session focused on addressing dysphagia goals. Patient asked similar questions regarding his dysphagia as last week; however, vocal quality and intensity have improved.  As a result, SLP facilitated session by providing education regarding source of dysphagia as well as rationale for previously provided vocal fold adduction and pharyngeal strengthening exercises.  SLP also facilitated session with trials of ice chips, which patient self-fed with Supervision cues for pacing, use of multiple hard swallows and intermittent cough/throat clears.  Patient demonstrated wet vocal quality x1 throughout trials.  Continue with current plan of care.   FIM:  Comprehension Comprehension Mode: Auditory Comprehension: 5-Understands basic 90% of the time/requires cueing < 10% of the time Expression Expression Mode: Verbal Expression: 5-Expresses complex 90% of the  time/cues < 10% of the time Social Interaction Social Interaction: 5-Interacts appropriately 90% of the time - Needs monitoring or encouragement for participation or interaction. Problem Solving Problem Solving: 4-Solves basic 75 - 89% of the time/requires cueing 10 - 24% of the time Memory Memory: 3-Recognizes or recalls 50 - 74% of the time/requires cueing 25 - 49% of the time FIM - Eating Eating Activity: 5: Supervision/cues  Pain Pain Assessment Pain Assessment: No/denies pain  Therapy/Group: Individual Therapy  Carmelia Roller., [place] [phone]  [place] 03/11/2014, 12:35 PM

## 2014-03-11 NOTE — Progress Notes (Signed)
Physical Therapy Session Note  Patient Details  Name: [name] [name] MRN: [id number] Date of Birth: 12-20-1950  Today's Date: 03/11/2014 PT Individual Time: 0800-0900 PT Individual Time Calculation (min): 60 min   Short Term Goals: Week 1:  PT Short Term Goal 1 (Week 1): Pt to perform bed mobility in a standard bed at mod(I) level PT Short Term Goal 2 (Week 1): Pt to perform bed<>w/c transfers with supervision, 50% of time PT Short Term Goal 3 (Week 1): Pt to propel wheelchair 200' with supervision PT Short Term Goal 4 (Week 1): Pt to ambulate 61' with RW and min guard A PT Short Term Goal 5 (Week 1): Pt to negotiate up/down 1 curb step with mod A  Skilled Therapeutic Interventions/Progress Updates:    Pt received seated in recliner, agreeable to participate in therapy. Ambulated 150' to rehab gym w/ RW w/ L knee sling and supervision. Mat exercises for LLE strengthening 2x12 SLR, x12 supine hip abd, glute sets, single leg bridging w/ RLE (in small range to prevent arching), x12 clams and HS Curls in R sidelying. Worked on supine<>sit w/ strategies to maintain back precautions, pt initially MinA progressing to supervision w/ practice. Ambulated to ADL apartment, pt w/ sit<>stand from standard bed while maintaining NWB on LLE w/ supervision. Pt performed task of finding items for place setting in ADL kitchen (plate, knife, fork, spoon, glass) w/ supervision. Pt ambulated back to room w/ close supervision. Pt left seated in recliner w/ all needs within reach.   Therapy Documentation Precautions:  Precautions Precautions: Back, Fall Required Braces or Orthoses: Spinal Brace Spinal Brace: Thoracolumbosacral orthotic, Applied in supine position Spinal Brace Comments: donn in supine Restrictions Weight Bearing Restrictions: Yes LLE Weight Bearing: Non weight bearing Other Position/Activity Restrictions: Discussed WB status at length. Reviewed back precautions General:   Vital Signs: Therapy  Vitals Temp: 97.5 F (36.4 C) Temp Source: Oral Pulse Rate: 75 Resp: 18 BP: (!) 165/82 mmHg Patient Position (if appropriate): Sitting Oxygen Therapy SpO2: 97 % Pain: Pain Assessment Pain Assessment: 0-10 Pain Score: 6  Pain Type: Acute pain Pain Location: Rib cage Pain Orientation: Left Pain Descriptors / Indicators: Aching Pain Onset: With Activity Pain Intervention(s): Medication (See eMAR) Mobility:   Locomotion :    Trunk/Postural Assessment :    Balance:   Exercises:   Other Treatments:    See FIM for current functional status  Therapy/Group: Co-Treatment  Glennon Hamilton, PT, DPT 03/11/2014, 7:45 AM

## 2014-03-11 NOTE — Progress Notes (Signed)
Speech Language Pathology Daily Session Note  Patient Details  Name: [name] [name] MRN: [id number] Date of Birth: 1950/12/02  Today's Date: 03/11/2014 SLP Individual Time: 1300-1400 SLP Individual Time Calculation (min): 60 min  Short Term Goals: Week 1: SLP Short Term Goal 1 (Week 1): Patient will utilize swallow compensatory strategies to minimize overt s/s of aspiration with Mod I.  SLP Short Term Goal 2 (Week 1): Patient will perform pharyngeal strengthening exercises with supervision multimodal cues.  SLP Short Term Goal 3 (Week 1): Patient will self-monitor and correct errors during functional tasks with Mod A multimodal cues.  SLP Short Term Goal 4 (Week 1): Patient will utilize external memory aids to recall new, daily information with Min A multimodal cues.  SLP Short Term Goal 5 (Week 1): Patient will demonstrate problem solving for mildly complex tasks with Min A multimodal cues.  SLP Short Term Goal 6 (Week 1): Patient will utilize call bell to request assistance with Mod I.   Skilled Therapeutic Interventions: Skilled treatment session focused on dysphagia and cognitive goals. Upon arrival, patient was sitting upright in the recliner with consuming his lunch meal. SLP facilitated session by providing intermittent supervision verbal cues for utilization of multiple swallows with Dys. 1 textures and pudding thick liquids without overt s/s of aspiration. SLP also facilitated session by providing Mod A multimodal cues for functional problem solving during a mildly complex, new learning task with a card game. SLP also facilitated a functional conversation with focus on anticipatory awareness for d/c planning. Patient required total A for anticipatory awareness in regards to f/u therapy and appropriate activities to participate in at home. Continue with current plan of care.    FIM:  Comprehension Comprehension Mode: Auditory Comprehension: 5-Understands basic 90% of the time/requires  cueing < 10% of the time Expression Expression Mode: Verbal Expression: 5-Expresses complex 90% of the time/cues < 10% of the time Social Interaction Social Interaction: 5-Interacts appropriately 90% of the time - Needs monitoring or encouragement for participation or interaction. Problem Solving Problem Solving: 4-Solves basic 75 - 89% of the time/requires cueing 10 - 24% of the time Memory Memory: 3-Recognizes or recalls 50 - 74% of the time/requires cueing 25 - 49% of the time FIM - Eating Eating Activity: 5: Supervision/cues  Pain Pain Assessment Pain Assessment: No/denies pain  Therapy/Group: Individual Therapy  [name] [name] 03/11/2014, 3:33 PM

## 2014-03-11 NOTE — Progress Notes (Signed)
Occupational Therapy Session Note  Patient Details  Name: [name] [name] MRN: [id number] Date of Birth: 04-21-1951  Today's Date: 03/11/2014 OT Individual Time: 1000-1100 OT Individual Time Calculation (min): 60 min    Short Term Goals: Week 1:  OT Short Term Goal 1 (Week 1): Pt will complete grooming task in standing with supervision for at least 1 min  OT Short Term Goal 2 (Week 1): Pt will complete toilet task at supervision level with mod cues OT Short Term Goal 3 (Week 1): Pt will complete LB dressing with min assist and use of AE   Skilled Therapeutic Interventions/Progress Updates:    Pt resting in recliner upon arrival.  Pt required min encouragement to participate in therapy.  Pt initially reluctant to return to bed to remove TLSO stating that "it can be removed while I'm sitting up." Reeducated patient on MD orders that TLSO must be removed and donned while supine in bed.  Pt returned to bed to complete UB bathing and dressing tasks.  Pt required min verbal cues for correct rolling technique to prevent twisting in bed.  Pt amb to sink utilizing RW with knee-sling.  Pt completed LB bathing and dressing tasks with sit<>stand from w/c.  Pt required assistance to bathe buttocks.  Pt amb with RW to ADL apartment and practiced sit<>stand from EOB and bed mobility.  Focus on activity tolerance, bed mobility, sit<>stand, transfers, functional amb with RW, reeducation on back precautions and MD orders for TLSO, and safety awareness. Therapy Documentation Precautions:  Precautions Precautions: Back, Fall Required Braces or Orthoses: Spinal Brace Spinal Brace: Thoracolumbosacral orthotic, Applied in supine position Spinal Brace Comments: donn in supine Restrictions Weight Bearing Restrictions: Yes LLE Weight Bearing: Non weight bearing Other Position/Activity Restrictions: Discussed WB status at length. Reviewed back precautions  Pain: Pain Assessment Pain Assessment: No/denies  pain  See FIM for current functional status  Therapy/Group: Individual Therapy  Leroy Libman 03/11/2014, 12:25 PM
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
## 2014-03-11 NOTE — Progress Notes (Signed)
Physical Therapy Session Note  Patient Details  Name: [name] [name] MRN: [id number] Date of Birth: 1950/08/02  Today's Date: 03/11/2014 PT Individual Time: 1600-1700 PT Individual Time Calculation (min): 60 min   Short Term Goals: Week 1:  PT Short Term Goal 1 (Week 1): Pt to perform bed mobility in a standard bed at mod(I) level PT Short Term Goal 2 (Week 1): Pt to perform bed<>w/c transfers with supervision, 50% of time PT Short Term Goal 3 (Week 1): Pt to propel wheelchair 200' with supervision PT Short Term Goal 4 (Week 1): Pt to ambulate 21' with RW and min guard A PT Short Term Goal 5 (Week 1): Pt to negotiate up/down 1 curb step with mod A  Skilled Therapeutic Interventions/Progress Updates:  1:1. Pt received sitting in recliner, ready for therapy. Focus this session on functional endurance during ambulation and w/c propulsion, functional transfers and general safety awareness. Pt able to propel w/c 150'x2 with B UE and intermittent use of R LE, req distant supervision. Pt able to safely negotiate up/down curb step backwards with RW, sitting immediately in chair once on top of step, min guard A. Pt not safe to manage RW completely up step. Pt with good tolerance to multiple bouts of ambulation 100-150' with RW and knee sling this session, req supervision overall with occasional cues for decreased pace for safety.   Pt req close(S) with min cues for performance of car transfer, multiple t/f sit<>stand and t/f L sidelying<>sit EOB in standard bed. Pt able to accurately recall 3/3 back precautions. Pt amb around therapy gym to locate horseshoes to challenge obstacle negotiation, balance and safety awareness while utilizing walker bag for collection of horseshoes and reacher as needed. Pt req close(S) overall for task.  Pt with poor frustration tolerance throughout session regarding repetition of tasks for improved safety and technique as well as resistance to education from therapist this  session regarding safety and role/goals of physical therapy.   Pt left sitting in recliner at end of session w/ all needs in reach and family at side.   Therapy Documentation Precautions:  Precautions Precautions: Back, Fall Required Braces or Orthoses: Spinal Brace Spinal Brace: Thoracolumbosacral orthotic, Applied in supine position Spinal Brace Comments: donn in supine Restrictions Weight Bearing Restrictions: Yes LLE Weight Bearing: Non weight bearing Other Position/Activity Restrictions: Discussed WB status at length. Reviewed back precautions Pain: Pain Assessment Pain Assessment: No/denies pain  See FIM for current functional status  Therapy/Group: Individual Therapy  Gilmore Laroche 03/11/2014, 5:05 PM

## 2014-03-12 ENCOUNTER — Inpatient Hospital Stay (HOSPITAL_COMMUNITY): Payer: Self-pay | Admitting: Speech Pathology

## 2014-03-12 ENCOUNTER — Inpatient Hospital Stay (HOSPITAL_COMMUNITY): Payer: Medicare Other

## 2014-03-12 ENCOUNTER — Inpatient Hospital Stay (HOSPITAL_COMMUNITY): Payer: Medicare Other | Admitting: Physical Therapy

## 2014-03-12 ENCOUNTER — Inpatient Hospital Stay (HOSPITAL_COMMUNITY): Payer: Medicare Other | Admitting: Speech Pathology

## 2014-03-12 LAB — CBC
HCT: 29.7 % — ABNORMAL LOW (ref 39.0–52.0)
Hemoglobin: 9.2 g/dL — ABNORMAL LOW (ref 13.0–17.0)
MCH: 29.4 pg (ref 26.0–34.0)
MCHC: 31 g/dL (ref 30.0–36.0)
MCV: 94.9 fL (ref 78.0–100.0)
Platelets: 212 10*3/uL (ref 150–400)
RBC: 3.13 MIL/uL — ABNORMAL LOW (ref 4.22–5.81)
RDW: 14.6 % (ref 11.5–15.5)
WBC: 6.2 10*3/uL (ref 4.0–10.5)

## 2014-03-12 LAB — BASIC METABOLIC PANEL
Anion gap: 13 (ref 5–15)
BUN: 16 mg/dL (ref 6–23)
CALCIUM: 8.7 mg/dL (ref 8.4–10.5)
CO2: 24 mEq/L (ref 19–32)
Chloride: 104 mEq/L (ref 96–112)
Creatinine, Ser: 0.7 mg/dL (ref 0.50–1.35)
Glucose, Bld: 91 mg/dL (ref 70–99)
POTASSIUM: 4.3 meq/L (ref 3.7–5.3)
Sodium: 141 mEq/L (ref 137–147)

## 2014-03-12 NOTE — Plan of Care (Signed)
Problem: RH BOWEL ELIMINATION Goal: RH STG MANAGE BOWEL WITH ASSISTANCE STG Manage Bowel with min Assistance.  Outcome: Progressing Goal: RH STG MANAGE BOWEL W/MEDICATION W/ASSISTANCE STG Manage Bowel with Medication with min Assistance.  Outcome: Progressing  Problem: RH SKIN INTEGRITY Goal: RH STG SKIN FREE OF INFECTION/BREAKDOWN Outcome: Progressing Goal: RH STG MAINTAIN SKIN INTEGRITY WITH ASSISTANCE STG Maintain Skin Integrity With min Assistance.  Outcome: Progressing  Problem: RH SAFETY Goal: RH STG ADHERE TO SAFETY PRECAUTIONS W/ASSISTANCE/DEVICE STG Adhere to Safety Precautions With min Assistance/Device.  Outcome: Progressing Goal: RH STG DECREASED RISK OF FALL WITH ASSISTANCE STG Decreased Risk of Fall With min Assistance.  Outcome: Progressing  Problem: RH PAIN MANAGEMENT Goal: RH STG PAIN MANAGED AT OR BELOW PT'S PAIN GOAL Less than 3 out of 10  Outcome: Progressing

## 2014-03-12 NOTE — Progress Notes (Signed)
Occupational Therapy Session Note  Patient Details  Name: [name] [name] MRN: [id number] Date of Birth: [birth year]  Today's Date: 03/12/2014 OT Individual Time: 1100-1200 OT Individual Time Calculation (min): 60 min    Short Term Goals: Week 1:  OT Short Term Goal 1 (Week 1): Pt will complete grooming task in standing with supervision for at least 1 min  OT Short Term Goal 2 (Week 1): Pt will complete toilet task at supervision level with mod cues OT Short Term Goal 3 (Week 1): Pt will complete LB dressing with min assist and use of AE   Skilled Therapeutic Interventions/Progress Updates:    Pt seen for ADL retraining with focus on functional transfers, activity tolerance, bed mobility, and adherence to precautions. Pt received sitting in recliner and verbalized 3/3 back precautions. Ambulated to bathroom with close supervision using RW to complete toilet task. Pt required supervision for standing balance during clothing management and min cues for adherence to precautions. Completed LB dressing in w/c with setup assist and min cues for adherence to precautions. Completed UB dressing and bathing while supine in bed. Pt with increased discomfort in ribs while rolling in bed to don/doff TLSO. Spoke with PA in regards to possibly allowing pt to don TLSO sitting EOB. Pt required total assist to don TLSO and agreed to have sister come in for family ed in regards to providing assist with this (will follow-up with CSW). Pt propelled self in w/c to ortho gym with focus on activity tolerance and BUE strength. Transferred to mat table at supervision level then completed supine<>sit without cues for use of log roll technique. Pt propelled self back to room then returned to recliner chair. Therapist educated pt multiple times on importance of wearing TLSO at home and donning/doffing in supine as pt reports "I'm not going to wear this" multiple times. Pt left sitting in recliner with all needs in reach.    Therapy Documentation Precautions:  Precautions Precautions: Back, Fall Required Braces or Orthoses: Spinal Brace Spinal Brace: Thoracolumbosacral orthotic, Applied in supine position Spinal Brace Comments: donn in supine Restrictions Weight Bearing Restrictions: Yes LLE Weight Bearing: Non weight bearing Other Position/Activity Restrictions: Discussed WB status at length. Reviewed back precautions General:   Vital Signs:   Pain: Pain Assessment Pain Assessment: 0-10 Pain Score: 4  Pain Type: Acute pain Pain Location: Rib cage Pain Orientation: Right;Left Pain Descriptors / Indicators: Aching;Discomfort Pain Frequency: Intermittent Pain Onset: On-going Patients Stated Pain Goal: 2 Pain Intervention(s): Medication (See eMAR) Multiple Pain Sites: No  See FIM for current functional status  Therapy/Group: Individual Therapy  Duayne Cal 03/12/2014, 12:08 PM

## 2014-03-12 NOTE — Plan of Care (Signed)
Problem: RH PAIN MANAGEMENT Goal: RH STG PAIN MANAGED AT OR BELOW PT'S PAIN GOAL Less than 3 out of 10  Outcome: Progressing Currently 4/10
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
## 2014-03-12 NOTE — Progress Notes (Signed)
Speech Language Pathology Daily Session Note  Patient Details  Name: [name] [name] MRN: [id number] Date of Birth: [birth year]  Today's Date: 03/12/2014 SLP Individual Time: 1000-1030 SLP Individual Time Calculation (min): 30 min  Short Term Goals: Week 1: SLP Short Term Goal 1 (Week 1): Patient will utilize swallow compensatory strategies to minimize overt s/s of aspiration with Mod I.  SLP Short Term Goal 2 (Week 1): Patient will perform pharyngeal strengthening exercises with supervision multimodal cues.  SLP Short Term Goal 3 (Week 1): Patient will self-monitor and correct errors during functional tasks with Mod A multimodal cues.  SLP Short Term Goal 4 (Week 1): Patient will utilize external memory aids to recall new, daily information with Min A multimodal cues.  SLP Short Term Goal 5 (Week 1): Patient will demonstrate problem solving for mildly complex tasks with Min A multimodal cues.  SLP Short Term Goal 6 (Week 1): Patient will utilize call bell to request assistance with Mod I.   Skilled Therapeutic Interventions: Skilled treatment session focused on dysphagia goals.  SLP facilitated session by providing extensive education in regards to the results of the FEES, diet recommendations, appropriate textures and swallowing compensatory strategies.  Patient was also given a menu and was educated on appropriate selections. Patient verbalized understanding but will need reinforcement.  SLP also facilitated session by providing skilled observation with nectar-thick liquids via cup. Patient consumed without overt s/s of aspiration. Patient left in recliner with all needs within reach. Continue with current plan of care.     FIM:  Comprehension Comprehension Mode: Auditory Comprehension: 5-Understands basic 90% of the time/requires cueing < 10% of the time Expression Expression Mode: Verbal Expression: 5-Expresses complex 90% of the time/cues < 10% of the time Social Interaction Social  Interaction: 5-Interacts appropriately 90% of the time - Needs monitoring or encouragement for participation or interaction. Problem Solving Problem Solving: 5-Solves basic 90% of the time/requires cueing < 10% of the time Memory Memory: 4-Recognizes or recalls 75 - 89% of the time/requires cueing 10 - 24% of the time FIM - Eating Eating Activity: 5: Supervision/cues  Pain Pain Assessment Pain Assessment: No/denies pain   Therapy/Group: Individual Therapy  Shyanne Mcclary, [place] 03/12/2014, 3:31 PM
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
## 2014-03-12 NOTE — Progress Notes (Signed)
Speech Language Pathology Daily Session Note  Patient Details  Name: [name] [name] MRN: [id number] Date of Birth: 25-Aug-1950  Today's Date: 03/12/2014 SLP Individual Time: 1300-1345 SLP Individual Time Calculation (min): 45 min  Short Term Goals: Week 1: SLP Short Term Goal 1 (Week 1): Patient will utilize swallow compensatory strategies to minimize overt s/s of aspiration with Mod I.  SLP Short Term Goal 2 (Week 1): Patient will perform pharyngeal strengthening exercises with supervision multimodal cues.  SLP Short Term Goal 3 (Week 1): Patient will self-monitor and correct errors during functional tasks with Mod A multimodal cues.  SLP Short Term Goal 4 (Week 1): Patient will utilize external memory aids to recall new, daily information with Min A multimodal cues.  SLP Short Term Goal 5 (Week 1): Patient will demonstrate problem solving for mildly complex tasks with Min A multimodal cues.  SLP Short Term Goal 6 (Week 1): Patient will utilize call bell to request assistance with Mod I.   Skilled Therapeutic Interventions: Pt was seen for skilled ST targeting dysphagia goals and ongoing education related to currently prescribed diet and swallowing precautions.  Upon arrival, pt was seated upright in chair, awake, alert, and agreeable to participate in [place].  Pt recalled results of today's FEES study, including at least 1 recommended swallowing precautions.  SLP reviewed and reinforced additional swallowing precautions and provided skilled education related to normal and abnormal swallowing anatomy and physiology with the use of a visual aid.  SLP facilitated the session with trials of regular water following thorough oral care to continue working towards liquids advancement.  Pt required min assist to use his swallowing strategies during trials and presented with intermittently wet vocal quality followed by a delayed cough which SLP suspects was related to portion control.  No overt s/s of aspiration  with small controlled cup sips with cuing for extra swallows.  Pt is making good gains towards meeting short term goals.  Continue per current plan of care.    FIM:  Comprehension Comprehension Mode: Auditory Comprehension: 5-Understands basic 90% of the time/requires cueing < 10% of the time Expression Expression Mode: Verbal Expression: 5-Expresses complex 90% of the time/cues < 10% of the time Social Interaction Social Interaction: 5-Interacts appropriately 90% of the time - Needs monitoring or encouragement for participation or interaction. Problem Solving Problem Solving: 5-Solves basic 90% of the time/requires cueing < 10% of the time Memory Memory: 4-Recognizes or recalls 75 - 89% of the time/requires cueing 10 - 24% of the time FIM - Eating Eating Activity: 5: Supervision/cues  Pain Pain Assessment Pain Assessment: No/denies pain  Therapy/Group: Individual Therapy   Windell Moulding, M.A. CCC-SLP  Jerlisa Diliberto, Selinda Orion 03/12/2014, 3:56 PM

## 2014-03-12 NOTE — Progress Notes (Signed)
Orthopedic Tech Progress Note Patient Details:  [name] [name] 1950/09/19 [id number]  Casting Type of Cast: Short leg cast Cast Location: lle Cast Intervention: Removal  Previous cast removed ;short leg cast reapplied   [name] 03/12/2014, 3:02 PM

## 2014-03-12 NOTE — Progress Notes (Addendum)
Contacted Dr. Doran Durand for input on splint--surgery on 10/13.  He recommended removing splint and sutures if ready. Replace splint with cast--foot in neutral position.  Patient with complaints about donning TLSO while supine as it causes rib discomfort. Contacted Dr. Christella Noa regarding liberalizing restrictions allow donning brace at EOB as well as question need for follow up X rays as one month post injury.   He was cleared to don brace at EOB and upright lumbar films ordered for follow up per input from Dr. Christella Noa.

## 2014-03-12 NOTE — Progress Notes (Signed)
Orthopedic Tech Progress Note Patient Details:  [name] [name] November 20, 1950 [id number]  Patient ID: [name], male   DOB: 03/04/1951, 63 y.o.   MRN: [id number] Viewed order from doctor's order list  [name] 03/12/2014, 3:03 PM

## 2014-03-12 NOTE — Progress Notes (Signed)
Physical Therapy Note  Patient Details  Name: [name] [name] MRN: [id number] Date of Birth: 09/11/1950 Today's Date: 03/12/2014  Unable to see pt at scheduled treatment time due to LLE being recasted. Returned to pt's room after casting completed (30 minutes later) in attempt to initiate PT session. RN then informed this therapist that pt is unable to participate, as cast will take an additional 30 minutes to dry and pt preparing to leave unit for diagnostic imaging. Therefore, pt missing 60 minutes of skilled physical therapy.  Stefano Gaul 03/12/2014, 3:07 PM
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
## 2014-03-12 NOTE — Progress Notes (Signed)
Physical Therapy Session Note  Patient Details  Name: [name] [name] MRN: [id number] Date of Birth: 1951/04/17  Today's Date: 03/12/2014 PT Individual Time: 0800-0900 PT Individual Time Calculation (min): 60 min   Short Term Goals: Week 1:  PT Short Term Goal 1 (Week 1): Pt to perform bed mobility in a standard bed at mod(I) level PT Short Term Goal 2 (Week 1): Pt to perform bed<>w/c transfers with supervision, 50% of time PT Short Term Goal 3 (Week 1): Pt to propel wheelchair 200' with supervision PT Short Term Goal 4 (Week 1): Pt to ambulate 68' with RW and min guard A PT Short Term Goal 5 (Week 1): Pt to negotiate up/down 1 curb step with mod A  Skilled Therapeutic Interventions/Progress Updates:  1:1. Pt received sitting in recliner, ready for therapy. Focus this session on functional endurance during w/c propulsion and ambulation, functional transfers and dynamic sitting/standing balance. Pt req supervision for all t/f sit<>stand and SPT with RW. Pt able to safely ambulate 175'x1 with RW and supervision in mildly busy hallway, demonstrating safe pace with out cues.   Pt practiced w/c propulsion in controlled and community environments, 175'x1 and 300'x1 with B UE. Majority of propulsion performed in forward direction, however, also performed in reverse during negotiation of mild incline between central and north zones to target increased functional endurance.   Pt's dynamic standing balance challenged during obstacle negotiation course to target management of RW over thresholds, side stepping and negotiation up/down 6" step backwards x2. Pt initially req min A for safety, decreasing to supervision. Pt's dynamic sitting balance challenged during two rounds of horseshoes sitting on bosu ball reaching +/- BOS with regards to back precautions, close(S). Pt req supervision for picking up horseshoes from standing level with RW and use of reacher.  supervision.   Pt with overall improved safety  this session due to decreased impulsivity, however, pt still req cues/education for anticipatory awareness. Pt left sitting in recliner at end of session w/ all needs in reach.   Therapy Documentation Precautions:  Precautions Precautions: Back, Fall Required Braces or Orthoses: Spinal Brace Spinal Brace: Thoracolumbosacral orthotic, Applied in supine position Spinal Brace Comments: donn in supine Restrictions Weight Bearing Restrictions: Yes LLE Weight Bearing: Non weight bearing Other Position/Activity Restrictions: Discussed WB status at length. Reviewed back precautions Pain: Pain Assessment Pain Score: 4   See FIM for current functional status  Therapy/Group: Individual Therapy  Gilmore Laroche 03/12/2014, 9:05 AM

## 2014-03-13 ENCOUNTER — Inpatient Hospital Stay (HOSPITAL_COMMUNITY): Payer: Medicare Other

## 2014-03-13 ENCOUNTER — Encounter (HOSPITAL_COMMUNITY): Payer: Self-pay

## 2014-03-13 ENCOUNTER — Inpatient Hospital Stay (HOSPITAL_COMMUNITY): Payer: Self-pay | Admitting: Occupational Therapy

## 2014-03-13 ENCOUNTER — Inpatient Hospital Stay (HOSPITAL_COMMUNITY): Payer: Medicare Other | Admitting: Speech Pathology

## 2014-03-13 NOTE — Plan of Care (Signed)
Problem: RH BOWEL ELIMINATION Goal: RH STG MANAGE BOWEL WITH ASSISTANCE STG Manage Bowel with min Assistance.  Outcome: Progressing Goal: RH STG MANAGE BOWEL W/MEDICATION W/ASSISTANCE STG Manage Bowel with Medication with min Assistance.  Outcome: Progressing  Problem: RH SKIN INTEGRITY Goal: RH STG SKIN FREE OF INFECTION/BREAKDOWN Outcome: Progressing Goal: RH STG MAINTAIN SKIN INTEGRITY WITH ASSISTANCE STG Maintain Skin Integrity With min Assistance.  Outcome: Progressing  Problem: RH SAFETY Goal: RH STG ADHERE TO SAFETY PRECAUTIONS W/ASSISTANCE/DEVICE STG Adhere to Safety Precautions With min Assistance/Device.  Outcome: Progressing Goal: RH STG DECREASED RISK OF FALL WITH ASSISTANCE STG Decreased Risk of Fall With min Assistance.  Outcome: Progressing  Problem: RH PAIN MANAGEMENT Goal: RH STG PAIN MANAGED AT OR BELOW PT'S PAIN GOAL Less than 3 out of 10  Outcome: Progressing

## 2014-03-13 NOTE — Progress Notes (Signed)
Speech Language Pathology Daily Session Note  Patient Details  Name: [name] [name] MRN: [id number] Date of Birth: 1950-06-11  Today's Date: 03/13/2014 SLP Individual Time: 4742-5956 SLP Individual Time Calculation (min): 15 min  Short Term Goals: Week 1: SLP Short Term Goal 1 (Week 1): Patient will utilize swallow compensatory strategies to minimize overt s/s of aspiration with Mod I.  SLP Short Term Goal 2 (Week 1): Patient will perform pharyngeal strengthening exercises with supervision multimodal cues.  SLP Short Term Goal 3 (Week 1): Patient will self-monitor and correct errors during functional tasks with Mod A multimodal cues.  SLP Short Term Goal 4 (Week 1): Patient will utilize external memory aids to recall new, daily information with Min A multimodal cues.  SLP Short Term Goal 5 (Week 1): Patient will demonstrate problem solving for mildly complex tasks with Min A multimodal cues.  SLP Short Term Goal 6 (Week 1): Patient will utilize call bell to request assistance with Mod I.   Skilled Therapeutic Interventions: Skilled treatment session focused on cognitive-linguistic goals.  Student facilitated session by providing Min-Mod A multimodal cues for recall of upgraded diet recommendations and utilization of swallowing compensatory strategies. Patient verbalized decreased likelihood of utilizing thickener to achieve nectar-thick consistency of liquids upon d/c home, recommend continue facilitating water protocol and providing education in regards to safe swallowing and aspiration risks. Student also facilitated session by providing Min A multimodal cues for recall of d/c plan and safety precautions upon d/c to sister's house. Session ended 45 minutes early due to patient getting cast reset on left foot. Continue with current plan of care.    FIM:  Comprehension Comprehension Mode: Auditory Comprehension: 5-Understands basic 90% of the time/requires cueing < 10% of the  time Expression Expression Mode: Verbal Expression: 5-Expresses complex 90% of the time/cues < 10% of the time Social Interaction Social Interaction: 5-Interacts appropriately 90% of the time - Needs monitoring or encouragement for participation or interaction. Problem Solving Problem Solving: 5-Solves basic 90% of the time/requires cueing < 10% of the time Memory Memory: 4-Recognizes or recalls 75 - 89% of the time/requires cueing 10 - 24% of the time  Pain Pain Assessment Pain Assessment: No/denies pain  Therapy/Group: Individual Therapy  [name] 03/13/2014, 3:56 PM
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
## 2014-03-13 NOTE — Progress Notes (Signed)
Speech Language Pathology Daily Session Note  Patient Details  Name: [name] [name] MRN: [id number] Date of Birth: 19-Feb-1951  Today's Date: 03/13/2014 SLP Individual Time: 1300-1400 SLP Individual Time Calculation (min): 60 min  Short Term Goals: Week 1: SLP Short Term Goal 1 (Week 1): Patient will utilize swallow compensatory strategies to minimize overt s/s of aspiration with Mod I.  SLP Short Term Goal 2 (Week 1): Patient will perform pharyngeal strengthening exercises with supervision multimodal cues.  SLP Short Term Goal 3 (Week 1): Patient will self-monitor and correct errors during functional tasks with Mod A multimodal cues.  SLP Short Term Goal 4 (Week 1): Patient will utilize external memory aids to recall new, daily information with Min A multimodal cues.  SLP Short Term Goal 5 (Week 1): Patient will demonstrate problem solving for mildly complex tasks with Min A multimodal cues.  SLP Short Term Goal 6 (Week 1): Patient will utilize call bell to request assistance with Mod I.   Skilled Therapeutic Interventions: Skilled treatment session focused on dysphagia and cognitive goals. SLP facilitated session by reeducated the patient in regards to his current swallowing function, diet recommendations, appropriate textures and swallowing compensatory strategies. Patient performed oral care with Mod I and consumed trials of thin liquids via cup without overt s/s of aspiration, recommend initiation of the water protocol. Patient verbalized understanding of all information provided but will need reinforcement. Patient continues to demonstrate decreased emergent awareness of both his physical and cognitive deficits and the overall impact they will have on his function at discharge. Education will need to be completed with the patient's family to increase his safety at discharge. Continue with current plan of care.    FIM:  Comprehension Comprehension Mode: Auditory Comprehension: 5-Understands  basic 90% of the time/requires cueing < 10% of the time Expression Expression Mode: Verbal Expression: 5-Expresses complex 90% of the time/cues < 10% of the time Social Interaction Social Interaction: 5-Interacts appropriately 90% of the time - Needs monitoring or encouragement for participation or interaction. Problem Solving Problem Solving: 5-Solves basic 90% of the time/requires cueing < 10% of the time Memory Memory: 4-Recognizes or recalls 75 - 89% of the time/requires cueing 10 - 24% of the time FIM - Eating Eating Activity: 5: Supervision/cues  Pain Pain Assessment Pain Assessment: No/denies pain  Therapy/Group: Individual Therapy  [name] [name], [place] 03/13/2014, 4:17 PM

## 2014-03-13 NOTE — Progress Notes (Signed)
Occupational Therapy Session Note  Patient Details  Name: [name] [name] MRN: [id number] Date of Birth: 1951/01/30  Today's Date: 03/13/2014 OT Individual Time: 0900-1000 OT Individual Time Calculation (min): 60 min    Short Term Goals: Week 1:  OT Short Term Goal 1 (Week 1): Pt will complete grooming task in standing with supervision for at least 1 min  OT Short Term Goal 2 (Week 1): Pt will complete toilet task at supervision level with mod cues OT Short Term Goal 3 (Week 1): Pt will complete LB dressing with min assist and use of AE   Skilled Therapeutic Interventions/Progress Updates:    Pt declined bathing and dressing this morning.  Pt stated he washed "real good" yesterday.  Pt did agree to washing face while standing at sink (utilizing knee sling) prior to ambulating to ADL apartment.  Pt demonstrated doffing and donning TLSO.  Pt required mod verbal cues for orientation of TLSO when donning.  Pt amb with RW in apartment to complete home mgmt tasks.  Pt amb with RW to kitchen and completed simple kitchen tasks.  Pt completed all tasks at supervision level.  Continued discharge planning and recommendations.  Focus on activity tolerance, safety awareness, dynamic standing balance, and functional amb with RW.  Therapy Documentation Precautions:  Precautions Precautions: Back, Fall Required Braces or Orthoses: Spinal Brace Spinal Brace: Thoracolumbosacral orthotic, Applied in supine position Spinal Brace Comments: donn in supine Restrictions Weight Bearing Restrictions: Yes LLE Weight Bearing: Non weight bearing Other Position/Activity Restrictions: Discussed WB status at length. Reviewed back precautions  Pain: Pain Assessment Pain Assessment: 0-10 Pain Score: 3   See FIM for current functional status  Therapy/Group: Individual Therapy  Leroy Libman 03/13/2014, 10:01 AM

## 2014-03-13 NOTE — Progress Notes (Signed)
Occupational Therapy Session Note  Patient Details  Name: [name] [name] MRN: [id number] Date of Birth: 1950/05/08  Today's Date: 03/13/2014 OT Individual Time: 1415-1445 OT Individual Time Calculation (min): 30 min    Short Term Goals: Week 1:  OT Short Term Goal 1 (Week 1): Pt will complete grooming task in standing with supervision for at least 1 min  OT Short Term Goal 2 (Week 1): Pt will complete toilet task at supervision level with mod cues OT Short Term Goal 3 (Week 1): Pt will complete LB dressing with min assist and use of AE   Skilled Therapeutic Interventions/Progress Updates:    1:1 focus on activity tolerance, safety awareness, recall of goals for each discipline and daily events, d/c planning,  functional ambulation with knee sling from room to ADL apartment and around ADL. Pt completed task of changing bed linens on bed with close to distant supervision at times with min cuing to safety. Pt able to complete while maintaining back and LE precautions. Pt requested tennis balls on bottom of 2 legs of RW- cut and add to RW.   Therapy Documentation Precautions:  Precautions Precautions: Back, Fall Required Braces or Orthoses: Spinal Brace Spinal Brace: Thoracolumbosacral orthotic, Applied in supine position Spinal Brace Comments: donn in supine Restrictions Weight Bearing Restrictions: Yes LLE Weight Bearing: Non weight bearing Other Position/Activity Restrictions: Discussed WB status at length. Reviewed back precautions Pain: C/o cast on left LE is too tight, reporting numbness- RN made aware.  See FIM for current functional status  Therapy/Group: Individual Therapy  Willeen Cass [facility] 03/13/2014, 3:16 PM

## 2014-03-13 NOTE — Progress Notes (Signed)
Orthopedic Tech Progress Note Patient Details:  [name] [name] [birth year] [id number] Pt. c/o numbness of all toes on LLE.  Pt. stated "I can't feel my toes and this cast hurts my leg."  Edema noted to calf at proximal portion of cast.  Pt. Is able to wiggle toes on command but denies being able to feel my touch on his toes.  Toes are cold to touch. Capillary refill present but approximately 4 seconds.  Received electronic order from PA to remove cast and re-apply a new one.  Removed cast.  Pt's skin wm, dry after removal. Pedal pulse present but difficult to palpate.  Capillary refill less than 2 seconds with cast off.  Applied new fiberglass short leg cast.  Motion, sensation, present after application.  Toes warm to touch.  Capillary refill less than 2 seconds after application.  Instructed pt. In cast care and to remain non-weight bearing.  Pt. stated "That feels much better now." Casting Type of Cast: Short leg cast Cast Location: LLE Cast Material: Fiberglass Cast Intervention: Removal, Re-application     Darrol Poke 03/13/2014, 4:26 PM

## 2014-03-13 NOTE — Plan of Care (Signed)
Problem: RH BOWEL ELIMINATION Goal: RH STG MANAGE BOWEL WITH ASSISTANCE STG Manage Bowel with min Assistance.  Outcome: Progressing Goal: RH STG MANAGE BOWEL W/MEDICATION W/ASSISTANCE STG Manage Bowel with Medication with min Assistance.  Outcome: Progressing  Problem: RH SKIN INTEGRITY Goal: RH STG MAINTAIN SKIN INTEGRITY WITH ASSISTANCE STG Maintain Skin Integrity With min Assistance.  Outcome: Progressing  Problem: RH SAFETY Goal: RH STG ADHERE TO SAFETY PRECAUTIONS W/ASSISTANCE/DEVICE STG Adhere to Safety Precautions With min Assistance/Device.  Outcome: Progressing Goal: RH STG DECREASED RISK OF FALL WITH ASSISTANCE STG Decreased Risk of Fall With min Assistance.  Outcome: Progressing  Problem: RH COGNITION-NURSING Goal: RH STG ANTICIPATES NEEDS/CALLS FOR ASSIST W/ASSIST/CUES STG Anticipates Needs/Calls for min Assist With Assistance/Cues.  Outcome: Progressing  Problem: RH PAIN MANAGEMENT Goal: RH STG PAIN MANAGED AT OR BELOW PT'S PAIN GOAL Less than 3 out of 10  Outcome: Progressing

## 2014-03-13 NOTE — Progress Notes (Signed)
Patient states his toes are numb on the left foot, he feels that the cast is too tight.  Paged Ortho Tech to reassess cast.  Brita Romp, RN

## 2014-03-13 NOTE — Progress Notes (Signed)
Physical Therapy Session Note  Patient Details  Name: [name] [name] MRN: [id number] Date of Birth: 09-Oct-1950  Today's Date: 03/13/2014 PT Individual Time: 1100-1200 PT Individual Time Calculation (min): 60 min   Short Term Goals: Week 1:  PT Short Term Goal 1 (Week 1): Pt to perform bed mobility in a standard bed at mod(I) level PT Short Term Goal 2 (Week 1): Pt to perform bed<>w/c transfers with supervision, 50% of time PT Short Term Goal 3 (Week 1): Pt to propel wheelchair 200' with supervision PT Short Term Goal 4 (Week 1): Pt to ambulate 59' with RW and min guard A PT Short Term Goal 5 (Week 1): Pt to negotiate up/down 1 curb step with mod A  Skilled Therapeutic Interventions/Progress Updates:  1:1. Pt received sitting in recliner, ready for therapy. Focus this session on functional endurance during w/c propulsion and ambulation, functional transfers and standing balance. Pt req overall supervision for w/c propulsion 300'x1 and 200'x2, including ramp negotiation between central and Orient zones with B UE and overall distant supervision. Pt with good tolerance to ambulation 175'x1 and 125'x1 with RW + knee sling and supervision. Pt demonstrating safe pace without cueing over hard level and carpeted surfaces including various thresholds.   In therapy apartment, emphasis on t/f L sidelying<>sit in standard bed and donning/doffing TLSO to target emergent awareness of back precautions. Pt req supervision with min cueing overall.   Pt engaged in 2 rounds of horseshoes at standing level with RW, emphasis on reaching +/- BOS in all quadrants with respect to back precautions. Pt req supervision during task.   Pt left sitting in recliner at end of session w/ all needs in reach.   Therapy Documentation Precautions:  Precautions Precautions: Back, Fall Required Braces or Orthoses: Spinal Brace Spinal Brace: Thoracolumbosacral orthotic, Applied in supine position Spinal Brace Comments: donn in  supine Restrictions Weight Bearing Restrictions: Yes LLE Weight Bearing: Non weight bearing Other Position/Activity Restrictions: Discussed WB status at length. Reviewed back precautions Pain: Pain Assessment Pain Assessment: No/denies pain  See FIM for current functional status  Therapy/Group: Individual Therapy  Gilmore Laroche 03/13/2014, 12:49 PM

## 2014-03-14 ENCOUNTER — Inpatient Hospital Stay (HOSPITAL_COMMUNITY): Payer: Medicare Other | Admitting: Physical Therapy

## 2014-03-14 ENCOUNTER — Inpatient Hospital Stay (HOSPITAL_COMMUNITY): Payer: Self-pay | Admitting: Speech Pathology

## 2014-03-14 ENCOUNTER — Inpatient Hospital Stay (HOSPITAL_COMMUNITY): Payer: Medicare Other | Admitting: *Deleted

## 2014-03-14 ENCOUNTER — Inpatient Hospital Stay (HOSPITAL_COMMUNITY): Payer: Medicare Other

## 2014-03-14 NOTE — Progress Notes (Signed)
Occupational Therapy Session Note  Patient Details  Name: [name] [name] MRN: [id number] Date of Birth: 13-May-1950  Today's Date: 03/14/2014 OT Individual Time: 0728-0828 OT Individual Time Calculation (min): 60 min    Short Term Goals: Week 1:  OT Short Term Goal 1 (Week 1): Pt will complete grooming task in standing with supervision for at least 1 min  OT Short Term Goal 2 (Week 1): Pt will complete toilet task at supervision level with mod cues OT Short Term Goal 3 (Week 1): Pt will complete LB dressing with min assist and use of AE   Skilled Therapeutic Interventions/Progress Updates:    Pt seen for ADL retraining with focus on awareness, standing balance, functional mobility, and activity tolerance. Pt received sitting in recliner chair. Engaged in extensive conversation in regards to follow-up, precautions, and recommendations from therapy team. Pt reporting he will "listen" however is going to "do my own thing at home." Pt completed bathing and dressing at sink with min cues for problem solving with donning/doffing TLSO. Pt completed sit<>stand 3x at supervision level while adhering to NWB precautions without cues. Pt required min cues for crossover technique for donning R sock then utilized reacher to don pants. Pt ambulated to bathroom with RW at supervision level to complete toilet task and transfer. Engaged in functional mobility around unit ~200 ft on tile and carpeted surface to simulate home environment. Pt ambulated at supervision level throughout task with no rest breaks. Pt returned to room and left in recliner chair with all needs in reach.    Therapy Documentation Precautions:  Precautions Precautions: Back, Fall Required Braces or Orthoses: Spinal Brace Spinal Brace: Thoracolumbosacral orthotic, Applied in supine position Spinal Brace Comments: donn in supine Restrictions Weight Bearing Restrictions: Yes LLE Weight Bearing: Non weight bearing Other Position/Activity  Restrictions: Discussed WB status at length. Reviewed back precautions General:   Vital Signs: Therapy Vitals Temp: 97.9 F (36.6 C) Temp Source: Oral Pulse Rate: 85 Resp: 18 BP: (!) 144/65 mmHg Patient Position (if appropriate): Sitting Oxygen Therapy SpO2: 96 % O2 Device: Not Delivered Pain: Pain Assessment Pain Score: 0-No pain        See FIM for current functional status  Therapy/Group: Individual Therapy  Duayne Cal 03/14/2014, 8:49 AM
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
## 2014-03-14 NOTE — Plan of Care (Signed)
Problem: RH BOWEL ELIMINATION Goal: RH STG MANAGE BOWEL WITH ASSISTANCE STG Manage Bowel with min Assistance.  Outcome: Progressing Goal: RH STG MANAGE BOWEL W/MEDICATION W/ASSISTANCE STG Manage Bowel with Medication with min Assistance.  Outcome: Progressing  Problem: RH SKIN INTEGRITY Goal: RH STG SKIN FREE OF INFECTION/BREAKDOWN Outcome: Progressing Goal: RH STG MAINTAIN SKIN INTEGRITY WITH ASSISTANCE STG Maintain Skin Integrity With min Assistance.  Outcome: Progressing  Problem: RH SAFETY Goal: RH STG ADHERE TO SAFETY PRECAUTIONS W/ASSISTANCE/DEVICE STG Adhere to Safety Precautions With min Assistance/Device.  Outcome: Progressing Goal: RH STG DECREASED RISK OF FALL WITH ASSISTANCE STG Decreased Risk of Fall With min Assistance.  Outcome: Progressing  Problem: RH PAIN MANAGEMENT Goal: RH STG PAIN MANAGED AT OR BELOW PT'S PAIN GOAL Less than 3 out of 10  Outcome: Progressing

## 2014-03-14 NOTE — Progress Notes (Signed)
Physical Therapy Session Note  Patient Details  Name: LADARRIAN ASENCIO MRN: [id number] Date of Birth: Oct 20, 1950  Today's Date: 03/14/2014 PT Individual Time: 1030-1130 and 1330-1430 PT Individual Time Calculation (min): 60 min and 60 min  Short Term Goals: Week 1:  PT Short Term Goal 1 (Week 1): Pt to perform bed mobility in a standard bed at mod(I) level PT Short Term Goal 2 (Week 1): Pt to perform bed<>w/c transfers with supervision, 50% of time PT Short Term Goal 3 (Week 1): Pt to propel wheelchair 200' with supervision PT Short Term Goal 4 (Week 1): Pt to ambulate 32' with RW and min guard A PT Short Term Goal 5 (Week 1): Pt to negotiate up/down 1 curb step with mod A  Skilled Therapeutic Interventions/Progress Updates:    AM Session: Patient received sitting in recliner. Session focused on functional transfers, wheelchair mobility, and gait training. Functional ambulation with RW + knee sling and supervision x175' with cues for appropriate pacing due to increased/unsafe speed. In gym, discussion about knee sling and whether patient will purchase one. Agreeable to attempt "theraband knee sling" consisting of two therabands to support L LE in similar fashion to knee sling, however, educated patient that he will not be able to bear weight through L knee like he can with knee sling and will require use of B UEs to ambulate. Additionally, educated patient that this will inhibit ability to ambulate longer distances.  Patient attempted ambulation with theraband sling x30' with close supervision-min guard. Patient with unsafe pacing and sequencing and with poor frustration tolerance, yelling at therapist about theraband. After long discussion of options (including wheelchair level mobility), patient decides he would like to purchase his own knee sling. Wheelchair mobility x150' (on carpet and tile) with supervision to day room. Assisted patient with searching knee slings on internet, emailing link to  his wife so she can purchase, and establishing that patient will speak with wife prior to next session to see if she was able to purchase.  Patient returned to room and left seated in recliner with all need within reach.  Patient has made it clear on numerous accounts that he will "do whatever he wants" when he goes home. At this time, concerns about patient compliance with TLSO, weight bearing precautions, and overall safety recommendations.  PM Session: Patient received sitting in recliner. Patient reports that his sister has placed order for RW knee sling that will be delivered by Monday 11/16. Discussed patient functioning at wheelchair level until knee sling arrives and patient verbalizes understanding. Attempted to tighten TLSO and patient refuses to allow therapist to do so. Educated patient on importance of appropriate fitting of TLSO.   Session focused on functional transfers, community wheelchair mobility, gait training, and activity tolerance. Functional ambulation with RW + knee sling and supervision x175' with cues for appropriate pacing due to increased/unsafe speed. Wheelchair mobility >500' on/off elevators, in hospital lobby, outside on uneven surfaces with supervision, cues for awareness of environment. Negotiation of curb step/1step without handrails and use of chair at top of step. Patient ascends with RW + knee sling backwards then immediately sits in chair. Maintaining RW on floor, patient stands from chair, positions L knee on knee sling, then steps down one step forwards. NuStep Level 3 x10' with L LE propped on kaye bench with pillow. Patient returned to room and left sitting in recliner with all needs within reach.  Patient's sister and sister in law present in patient's room. Confirmed family ed  session tomorrow and briefly discussed precautions/recommendations for discharge and concerns for patient non-compliance. Patient's sister and sister in law appear very aware and have  similar concerns upon discharge. Will follow up more tomorrow.  Therapy Documentation Precautions:  Precautions Precautions: Back, Fall Required Braces or Orthoses: Spinal Brace Spinal Brace: Thoracolumbosacral orthotic, Applied in supine position Spinal Brace Comments: donn in supine Restrictions Weight Bearing Restrictions: Yes LLE Weight Bearing: Non weight bearing Other Position/Activity Restrictions: Discussed WB status at length. Reviewed back precautions Pain: Pain Assessment Pain Assessment: No/denies pain Pain Score: 0-No pain Locomotion : Ambulation Ambulation/Gait Assistance: 5: Supervision Wheelchair Mobility Distance: 150   See FIM for current functional status  Therapy/Group: Individual Therapy  Lillia Abed. Carrianne Hyun, PT, DPT 03/14/2014, 11:59 AM

## 2014-03-14 NOTE — Progress Notes (Signed)
Social Work Patient ID: [name] [name], male   DOB: 09/19/1950, 63 y.o.   MRN: [id number]   Met yesterday with pt and spoke with sister, Darliss Ridgel, via phone all to review team conference and d/c plans.  Both aware and agreeable with targeted d/c date of 11/14 with supervision/ mod i goals overall.  Plan confirmed still that pt will d/c home with sister who can provide assistance.  Have scheduled family ed for tomorrow 10-12 pm.  Arranging HH f/u and DME.  Continue to follow.  Dravin Lance, LCSW

## 2014-03-14 NOTE — Progress Notes (Signed)
Speech Language Pathology Daily Session Note  Patient Details  Name: [name] [name] MRN: [id number] Date of Birth: Aug 28, 1950  Today's Date: 03/14/2014 SLP Individual Time: 0900-1000 SLP Individual Time Calculation (min): 60 min  Short Term Goals: Week 1: SLP Short Term Goal 1 (Week 1): Patient will utilize swallow compensatory strategies to minimize overt s/s of aspiration with Mod I.  SLP Short Term Goal 2 (Week 1): Patient will perform pharyngeal strengthening exercises with supervision multimodal cues.  SLP Short Term Goal 3 (Week 1): Patient will self-monitor and correct errors during functional tasks with Mod A multimodal cues.  SLP Short Term Goal 4 (Week 1): Patient will utilize external memory aids to recall new, daily information with Min A multimodal cues.  SLP Short Term Goal 5 (Week 1): Patient will demonstrate problem solving for mildly complex tasks with Min A multimodal cues.  SLP Short Term Goal 6 (Week 1): Patient will utilize call bell to request assistance with Mod I.   Skilled Therapeutic Interventions: Skilled treatment session focused on dysphagia and cognitive goals. SLP facilitated session by providing Min A multimodal cues for functional problem solving during new learning task of thickening liquids to the appropriate consistency of nectar-thick.  Patient then consumed the nectar-thick liquids via cup without overt s/s of aspiration and was Mod I for use of swallowing compensatory strategies. Patient was also provided education in regards to need for f/u SLP services at discharge to maximize his swallow function in hopes of an eventual upgrade to thin liquids. Patient verbalized understanding but requires reinforcement of information.  Patient independently requested to use the bathroom and completed tasks with distant supervision.  Continue with current plan of care.    FIM:  Comprehension Comprehension Mode: Auditory Comprehension: 5-Understands basic 90% of the  time/requires cueing < 10% of the time Expression Expression Mode: Verbal Expression: 5-Expresses basic needs/ideas: With extra time/assistive device Social Interaction Social Interaction: 5-Interacts appropriately 90% of the time - Needs monitoring or encouragement for participation or interaction. Problem Solving Problem Solving: 5-Solves basic 90% of the time/requires cueing < 10% of the time Memory Memory: 4-Recognizes or recalls 75 - 89% of the time/requires cueing 10 - 24% of the time FIM - Eating Eating Activity: 6: Modified consistency diet: (comment)  Pain Pain Assessment Pain Assessment: No/denies pain   Therapy/Group: Individual Therapy  Deerica Waszak, [place] 03/14/2014, 3:52 PM

## 2014-03-14 NOTE — Progress Notes (Signed)
Physical Therapy Session Note  Patient Details  Name: [name] [name] MRN: [id number] Date of Birth: 04-Apr-1951  Today's Date: 03/14/2014 PT Individual Time: 1530-1600 PT Individual Time Calculation (min): 30 min   Short Term Goals: Week 1 - See Short Term Goals  Skilled Therapeutic Interventions/Progress Updates:    Gait Training: PT instructs pt in ambulation with RW and L knee sling 150' x 2 reps req SBA and verbal cues to slow down.   Neuromuscular Reeducation: PT instructs pt in dynamic stand balance with L knee in sling without UE support: PT donned boxing gloves on pt's hands and instructed him in cognitive remediation activity to punch out the spelling of words that PT said x 10 minutes total req SBA for safety.   Pt's safety with gait and dynamic standing balance have improved in the last week. Pt verbalizes correct spinal precautions and correct weight-bearing for L LE and is able to maintain L LE NWB, but demonstrates slight twisting of spine when in stand (breaking spinal precautions) in order to make eye contact when conversing with someone. Continue per PT POC.   Therapy Documentation Precautions:  Precautions Precautions: Back, Fall Required Braces or Orthoses: Spinal Brace Spinal Brace: Thoracolumbosacral orthotic, Applied in supine position Spinal Brace Comments: donn in supine Restrictions Weight Bearing Restrictions: Yes LLE Weight Bearing: Non weight bearing Other Position/Activity Restrictions: Discussed WB status at length. Reviewed back precautions Pain: Pain Assessment Pain Assessment: 0-10 Pain Score: 4  Pain Type: Surgical pain Pain Location: Leg Pain Orientation: Left Pain Descriptors / Indicators: Aching Pain Onset: On-going Pain Intervention(s): Rest Multiple Pain Sites: No   See FIM for current functional status  Therapy/Group: Individual Therapy  Taziyah Iannuzzi M 03/14/2014, 3:41 PM

## 2014-03-15 ENCOUNTER — Encounter (HOSPITAL_COMMUNITY): Payer: Self-pay | Admitting: Speech Pathology

## 2014-03-15 ENCOUNTER — Inpatient Hospital Stay (HOSPITAL_COMMUNITY): Payer: Medicare Other | Admitting: *Deleted

## 2014-03-15 ENCOUNTER — Inpatient Hospital Stay (HOSPITAL_COMMUNITY): Payer: Self-pay | Admitting: Rehabilitation

## 2014-03-15 ENCOUNTER — Inpatient Hospital Stay (HOSPITAL_COMMUNITY): Payer: Medicare Other

## 2014-03-15 MED ORDER — HYDROCODONE-ACETAMINOPHEN 5-325 MG PO TABS: 1.0000 | ORAL_TABLET | Freq: Four times a day (QID) | ORAL | Status: DC | PRN

## 2014-03-15 MED ORDER — METOPROLOL TARTRATE 25 MG PO TABS: 12.5000 mg | ORAL_TABLET | Freq: Two times a day (BID) | ORAL | Status: DC

## 2014-03-15 MED ORDER — ACETAMINOPHEN 325 MG PO TABS: 325.0000 mg | ORAL_TABLET | Freq: Four times a day (QID) | ORAL | Status: AC | PRN

## 2014-03-15 MED ORDER — FUROSEMIDE 40 MG PO TABS: 20.0000 mg | ORAL_TABLET | Freq: Every day | ORAL | Status: DC

## 2014-03-15 MED ORDER — DOCUSATE SODIUM 50 MG/5ML PO LIQD: 100.0000 mg | Freq: Two times a day (BID) | ORAL | Status: DC

## 2014-03-15 MED ORDER — METHOCARBAMOL 500 MG PO TABS: 500.0000 mg | ORAL_TABLET | Freq: Four times a day (QID) | ORAL | Status: DC | PRN

## 2014-03-15 MED ORDER — SENNA 8.6 MG PO TABS
2.0000 | ORAL_TABLET | Freq: Every day | ORAL | Status: DC
Start: 2014-03-15 — End: 2022-08-02

## 2014-03-15 MED ORDER — POTASSIUM CHLORIDE CRYS ER 20 MEQ PO TBCR: 20.0000 meq | EXTENDED_RELEASE_TABLET | Freq: Every day | ORAL | Status: DC

## 2014-03-15 MED ORDER — RESOURCE THICKENUP CLEAR PO POWD: 1.0000 g | ORAL | Status: DC | PRN

## 2014-03-15 NOTE — Plan of Care (Signed)
Problem: RH Balance Goal: LTG Patient will maintain dynamic sitting balance (PT) LTG: Patient will maintain dynamic sitting balance with assistance during mobility activities (PT)  Outcome: Completed/Met Date Met:  03/15/14 Goal: LTG Patient will maintain dynamic standing balance (PT) LTG: Patient will maintain dynamic standing balance with assistance during mobility activities (PT)  Outcome: Completed/Met Date Met:  03/15/14  Problem: RH Bed Mobility Goal: LTG Patient will perform bed mobility with assist (PT) LTG: Patient will perform bed mobility with assistance, with/without cues (PT).  Outcome: Completed/Met Date Met:  03/15/14  Problem: RH Bed to Chair Transfers Goal: LTG Patient will perform bed/chair transfers w/assist (PT) LTG: Patient will perform bed/chair transfers with assistance, with/without cues (PT).  Outcome: Completed/Met Date Met:  03/15/14  Problem: RH Car Transfers Goal: LTG Patient will perform car transfers with assist (PT) LTG: Patient will perform car transfers with assistance (PT).  Outcome: Completed/Met Date Met:  03/15/14  Problem: RH Furniture Transfers Goal: LTG Patient will perform furniture transfers w/assist (OT/PT LTG: Patient will perform furniture transfers with assistance (OT/PT).  Outcome: Completed/Met Date Met:  03/15/14  Problem: RH Ambulation Goal: LTG Patient will ambulate in controlled environment (PT) LTG: Patient will ambulate in a controlled environment, # of feet with assistance (PT).  Outcome: Completed/Met Date Met:  03/15/14 Goal: LTG Patient will ambulate in home environment (PT) LTG: Patient will ambulate in home environment, # of feet with assistance (PT).  Outcome: Completed/Met Date Met:  03/15/14  Problem: RH Wheelchair Mobility Goal: LTG Patient will propel w/c in controlled environment (PT) LTG: Patient will propel wheelchair in controlled environment, # of feet with assist (PT)  Outcome: Completed/Met Date Met:   03/15/14 Goal: LTG Patient will propel w/c in home environment (PT) LTG: Patient will propel wheelchair in home environment, # of feet with assistance (PT).  Outcome: Completed/Met Date Met:  03/15/14 Goal: LTG Patient will propel w/c in community environment (PT) LTG: Patient will propel wheelchair in community environment, # of feet with assist (PT)  Outcome: Completed/Met Date Met:  03/15/14  Problem: RH Stairs Goal: LTG Patient will ambulate up and down stairs w/assist (PT) LTG: Patient will ambulate up and down # of stairs with assistance (PT)  Outcome: Completed/Met Date Met:  03/15/14  Problem: RH Awareness Goal: LTG: Patient will demonstrate intellectual/emergent (PT) LTG: Patient will demonstrate intellectual/emergent/anticipatory awareness with assist during a mobility activity (PT)  Outcome: Completed/Met Date Met:  03/15/14
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
## 2014-03-15 NOTE — Discharge Instructions (Signed)
Inpatient Rehab Discharge Instructions  [name] [name] Discharge date and time:  03/16/14  Activities/Precautions/ Functional Status: Activity: no lifting, driving, or strenuous exercise for till cleared by MD.  Diet:  Soft foods. Needs supervision at meals.  Wound Care: keep wound clean and dry Functional status:  ___ No restrictions     ___ Walk up steps independently _X__ 24/7 supervision/assistance   ___ Walk up steps with assistance ___ Intermittent supervision/assistance  ___ Bathe/dress independently ___ Walk with walker     _X__ Bathe/dress with assistance ___ Walk Independently    ___ Shower independently _X__ Walk with assistance    ___ Shower with assistance _X__ No alcohol     ___ Return to work/school ________     COMMUNITY REFERRALS UPON DISCHARGE:    Home Health:   PT     OT     ST                     Agency: [facility] Phone: [phone]    Medical Equipment/Items Ordered: wheelchair, cushion, rolling walker                                                     Agency/Supplier: [facility] @ [phone]       Special Instructions: 1. Needs to wear brace when at edge of bed and brace has to be on when out of bed.   2. Continue aspiration precautions for safe swallow.  3. NO weight on left leg.  My questions have been answered and I understand these instructions. I will adhere to these goals and the provided educational materials after my discharge from the hospital.  Patient/Caregiver Signature _______________________________ Date __________  Clinician Signature _______________________________________ Date __________  Please bring this form and your medication list with you to all your follow-up doctor's appointments.

## 2014-03-15 NOTE — Progress Notes (Signed)
Physical Therapy Session Note  Patient Details  Name: [name] [name] MRN: [id number] Date of Birth: [birth year]  Today's Date: 03/15/2014 PT Individual Time: 1300-1400 and 1430-1500 PT Individual Time Calculation (min): 60 min and 30 mins   Short Term Goals: Week 2:     Skilled Therapeutic Interventions/Progress Updates:   First PM session:  Pt received sitting in recliner in room, agreeable to therapy session.  Discussed goals of session, performing TUG to determine fall risk, floor transfer and w/c mobility for overall strengthening and endurance.  Pt self propelled throughout session with BUEs at mod I level (S for steep inclines to simulate community environment) and also to work on overall UE strengthening and endurance.  Performed TUG x 3 reps with avg score of 24.55 indicative of very high fall risk.  Discussed this with pt and that he would continue to improve on this, esp once allowed to weight bear through LLE.  Pt agreeable to performing floor transfer with max encouragement and coaxing.  Demonstrated how to perform from low couch in ADL apt to mat by going down forwards using UEs and RLE to lower with min A to maintain NWB through LLE.  Also demonstrated how to return to sitting position in same manner going in reverse using UEs and RLE.  Pt did very well getting down to floor and into supine, however requires mod/max A due to brace catching onto couch surface.  Max verbal cues to maintain NWB and back precautions during transfer and also educated that if happened at home, he could get small step to bump up to first then up to furniture.  Pt continues to be adamant that "I am NOT going to fall."  Continue to educate on safety concerns. Ended session with w/c mobility to 6th floor [place] tower to work on up/down steep inclines for [employer] re-integration and overall endurance.   Tolerated well.  Pt voiced being saddened about wife finding out she has lung, brain and bone cancer and that she is  currently at [facility] for removal of brain tumor.  Provided emotional support and words of encouragement.  Pt returned to room, ambulated to restroom and to sink all at S level.  Left in recliner with all needs in reach.    Second PM session:  Pt received sitting in recliner in room, agreeable to therapy session.  Transferred to w/c at S level using RW.  Min cues for safety.  Skilled session focused on w/c mobility in community setting downstairs, on outdoor and uneven surfaces and up/down steeper inclines/declines >500' total.  Performed all mobility at S to mod I level (depending upon difficulty of surface).  Requires intermittent rest breaks due to fatigue.  Continue to discuss safety concerns with balance, NWB on LLE, and need to properly don brace.  Pt continues to state "I've heard this until I'm sick of it."  Continue to agree with the need for 24/7 S at home.  Pt returned to room and transferred back to recliner and left with all needs in reach.   Therapy Documentation Precautions:  Precautions Precautions: Back, Fall Required Braces or Orthoses: Spinal Brace Spinal Brace: Thoracolumbosacral orthotic, Applied in sitting position Spinal Brace Comments: donn in supine Restrictions Weight Bearing Restrictions: Yes LLE Weight Bearing: Non weight bearing Other Position/Activity Restrictions: Discussed WB status at length. Reviewed back precautions.    Pain: Pain Assessment Pain Assessment: No/denies pain    Balance: Standardized Balance Assessment Standardized Balance Assessment: Timed Up and Go Test Timed  Up and Go Test TUG: Normal TUG Normal TUG (seconds): 24.55 (avg of three scores)  See FIM for current functional status  Therapy/Group: Individual Therapy  Denice Bors 03/15/2014, 2:05 PM

## 2014-03-15 NOTE — Plan of Care (Signed)
Problem: RH BOWEL ELIMINATION Goal: RH STG MANAGE BOWEL WITH ASSISTANCE STG Manage Bowel with min Assistance.  Outcome: Completed/Met Date Met:  03/15/14 Goal: RH STG MANAGE BOWEL W/MEDICATION W/ASSISTANCE STG Manage Bowel with Medication with min Assistance.  Outcome: Completed/Met Date Met:  03/15/14  Problem: RH SKIN INTEGRITY Goal: RH STG SKIN FREE OF INFECTION/BREAKDOWN Outcome: Completed/Met Date Met:  03/15/14 Goal: RH STG MAINTAIN SKIN INTEGRITY WITH ASSISTANCE STG Maintain Skin Integrity With min Assistance.  Outcome: Completed/Met Date Met:  03/15/14  Problem: RH SAFETY Goal: RH STG ADHERE TO SAFETY PRECAUTIONS W/ASSISTANCE/DEVICE STG Adhere to Safety Precautions With min Assistance/Device.  Outcome: Completed/Met Date Met:  03/15/14 Goal: RH STG DECREASED RISK OF FALL WITH ASSISTANCE STG Decreased Risk of Fall With min Assistance.  Outcome: Completed/Met Date Met:  03/15/14  Problem: RH COGNITION-NURSING Goal: RH STG ANTICIPATES NEEDS/CALLS FOR ASSIST W/ASSIST/CUES STG Anticipates Needs/Calls for min Assist With Assistance/Cues.  Outcome: Completed/Met Date Met:  03/15/14  Problem: RH PAIN MANAGEMENT Goal: RH STG PAIN MANAGED AT OR BELOW PT'S PAIN GOAL Less than 3 out of 10  Outcome: Progressing  Problem: RH KNOWLEDGE DEFICIT Goal: RH STG INCREASE KNOWLEDGE OF HYPERTENSION Outcome: Adequate for Discharge Goal: RH STG INCREASE KNOWLEDGE OF DYSPHAGIA/FLUID INTAKE Outcome: Adequate for Discharge

## 2014-03-15 NOTE — Progress Notes (Signed)
Physical Therapy Discharge Summary  Patient Details  Name: [name] [name] MRN: [id number] Date of Birth: 1950-12-14  Today's Date: 03/15/2014 PT Individual Time: 1100-1200 PT Individual Time Calculation (min): 60 min    Patient has met 13 of 13 long term goals due to improved activity tolerance, improved balance, improved postural control, increased strength, ability to compensate for deficits, functional use of  right upper extremity, left upper extremity and left lower extremity, improved attention, improved awareness and improved coordination.  Patient to discharge at a wheelchair level Supervision.   Patient's sister is independent to provide the necessary cognitive assistance at discharge, however, patient has stated on several occasions that he will "do whatever he wants at home" and resists safety recommendations provided by treatment team. See details below.  Reasons goals not met: N/A, all LTGs met.  Recommendation:  Patient will benefit from ongoing skilled PT services in home health setting to continue to advance safe functional mobility, address ongoing impairments in balance, strength, coordination, safety awareness, attention, awareness/insight of deficits, overall functional mobility, and minimize fall risk.  Equipment: RW, 18x18 wheelchair with cushion and elevating leg rests; patient purchased L RW knee sling independently  Reasons for discharge: treatment goals met and discharge from hospital  Patient/family agrees with progress made and goals achieved: Yes   Skilled Interventions: Session focused on family education/training in preparation for discharge home tomorrow, 03/15/14. Discussed supervision level overall with patient's sister and sister in law, as well as safety concerns with patient compliance for precautions and safety recommendations. Patient performing at overall supervision level for all functional mobility, including stand pivot transfers with RW and knee  sling, squat pivot transfers, furniture transfers, car transfers, ambulation in controlled and home environments; patient at mod I level for wheelchair mobility in controlled, home, and community environments, and bed mobility. Emphasized patient to be at wheelchair level until knee sling arrives on Monday with patient and family members verbalizing understanding and in agreement.   Continue with concerns about patient's compliance with back precautions, weight bearing precautions, as well as compliance with use of TLSO. Additionally, patient not responsive to feedback or training from therapists about safety recommendations including use of RW, appropriate pacing, and impulsivity. Patient has repeatedly reported he will "do whatever he wants when he goes home." Emphasized importance of compliance and patient usually argumentative or resistant to feedback.  PT Discharge Precautions/Restrictions Precautions Precautions: Back;Fall Required Braces or Orthoses: Spinal Brace Spinal Brace: Thoracolumbosacral orthotic;Applied in sitting position Spinal Brace Comments: donn in supine Restrictions Weight Bearing Restrictions: Yes LLE Weight Bearing: Non weight bearing Other Position/Activity Restrictions: Discussed WB status at length. Reviewed back precautions.  Pain Pain Assessment Pain Assessment: No/denies pain Pain Score: 0-No pain Pain Type: Acute pain Pain Location: Rib cage Pain Orientation: Left Pain Descriptors / Indicators: Aching Pain Intervention(s): Medication (See eMAR) Vision/Perception    See OT note. Cognition Overall Cognitive Status: Impaired/Different from baseline Arousal/Alertness: Awake/alert Orientation Level: Oriented X4 Attention: Selective Sustained Attention: Appears intact Selective Attention: Appears intact Memory: Impaired Memory Impairment: Decreased recall of new information Awareness: Impaired Awareness Impairment: Emergent impairment Problem Solving:  Impaired Problem Solving Impairment: Functional basic Safety/Judgment: Impaired [employer] Scales of Cognitive Functioning: Automatic/appropriate Sensation Sensation Light Touch: Appears Intact Hot/Cold: Appears Intact Proprioception: Appears Intact Coordination Gross Motor Movements are Fluid and Coordinated: No Fine Motor Movements are Fluid and Coordinated: No Coordination and Movement Description: NWB L LE and impulsive Motor  Motor Motor: Within Functional Limits  Mobility Bed Mobility Bed  Mobility: Supine to Sit;Sit to Supine Supine to Sit: 6: Modified independent (Device/Increase time);HOB flat Sit to Supine: 6: Modified independent (Device/Increase time);HOB flat Transfers Transfers: Yes Sit to Stand: 5: Supervision;With armrests;From bed;From chair/3-in-1;From toilet;With upper extremity assist Sit to Stand Details: Verbal cues for precautions/safety Stand to Sit: 5: Supervision;To chair/3-in-1;To bed;To toilet;With armrests;With upper extremity assist Stand to Sit Details (indicate cue type and reason): Verbal cues for precautions/safety Stand Pivot Transfers: 5: Supervision;With armrests Squat Pivot Transfers: 5: Supervision;With upper extremity assistance;With armrests Squat Pivot Transfer Details: Verbal cues for precautions/safety Locomotion  Ambulation Ambulation: Yes Ambulation/Gait Assistance: 5: Supervision Ambulation Distance (Feet): 200 Feet Assistive device: Rolling walker;Other (Comment) (+ L knee sling) Ambulation/Gait Assistance Details: Verbal cues for precautions/safety Ambulation/Gait Assistance Details: Patient performed gait training 200' in controlled environment and 50' in home environment (on carpet in ADL apartment) with RW + L knee sling and supervision; continues to require repeated max cues for safety with pacing due to inappropriate speed of movements and impulsivity. Gait Gait: Yes Gait Pattern: Impaired Gait Pattern: Decreased  step length - right;Step-to pattern Stairs / Additional Locomotion Stairs: Yes Stairs Assistance: 5: Supervision Stairs Assistance Details: Verbal cues for precautions/safety;Verbal cues for sequencing;Verbal cues for technique Stairs Assistance Details (indicate cue type and reason): Chair/wheelchair placed at top of stairs; ascends backwards, descends forwards. When ascending uses RW to negotiate one step backwards then immediately sits in wheelchair (so no need to lift RW). Stair Management Technique: No rails;With walker;Backwards;Forwards;Step to pattern (+ L knee sling) Number of Stairs: 2 Height of Stairs: 6 Curb: 5: Supervision (with RW + L knee sling) Wheelchair Mobility Wheelchair Mobility: Yes Wheelchair Assistance: 6: Modified independent (Device/Increase time) Environmental health practitioner: Both upper extremities Wheelchair Parts Management: Independent Distance: 500  Trunk/Postural Assessment  Cervical Assessment Cervical Assessment: Within Functional Limits Thoracic Assessment Thoracic Assessment: Exceptions to WFL (TLSO) Thoracic AROM Overall Thoracic AROM Comments: limited by LSO and back precautions Lumbar Assessment Lumbar Assessment: Exceptions to [facility] Lumbar AROM Overall Lumbar AROM Comments: limited due to back precautions, TLSO Postural Control Postural Control: Within Functional Limits Righting Reactions: delayed in standing  Balance Balance Balance Assessed: Yes Standardized Balance Assessment Standardized Balance Assessment: Timed Up and Go Test Static Sitting Balance Static Sitting - Balance Support: No upper extremity supported;Feet supported Static Sitting - Level of Assistance: 7: Independent Dynamic Sitting Balance Dynamic Sitting - Balance Support: Feet supported;Right upper extremity supported;Left upper extremity supported;Bilateral upper extremity supported Dynamic Sitting - Level of Assistance: 6: Modified independent (Device/Increase time) Dynamic  Sitting - Balance Activities: Lateral lean/weight shifting;Forward lean/weight shifting;Reaching for objects Static Standing Balance Static Standing - Balance Support: During functional activity;Bilateral upper extremity supported Static Standing - Level of Assistance: 5: Stand by assistance Dynamic Standing Balance Dynamic Standing - Balance Support: During functional activity;Bilateral upper extremity supported Dynamic Standing - Level of Assistance: 5: Stand by assistance Dynamic Standing - Balance Activities: Forward lean/weight shifting;Reaching for objects;Lateral lean/weight shifting Extremity Assessment  RUE Assessment RUE Assessment: Within Functional Limits LUE Assessment LUE Assessment: Within Functional Limits RLE Assessment RLE Assessment: Within Functional Limits (Grossly 4+/5) LLE Assessment LLE Assessment: Exceptions to WFL LLE AROM (degrees) Overall AROM Left Lower Extremity: Due to precautions LLE Overall AROM Comments: L ankle in cast LLE Strength LLE Overall Strength: Due to precautions;Deficits LLE Overall Strength Comments: Hip flexion, knee flex/ext: 4-/5; unable to test ankle due to cast  See FIM for current functional status  [place]. Giovoni Bunch, PT, DPT 03/15/2014, 4:45 PM

## 2014-03-15 NOTE — Plan of Care (Signed)
Problem: RH Balance Goal: LTG Patient will maintain dynamic standing with ADLs (OT) LTG: Patient will maintain dynamic standing balance with assist during activities of daily living (OT)  Outcome: Completed/Met Date Met:  03/15/14  Problem: RH Grooming Goal: LTG Patient will perform grooming w/assist,cues/equip (OT) LTG: Patient will perform grooming with assist, with/without cues using equipment (OT)  Outcome: Completed/Met Date Met:  03/15/14  Problem: RH Bathing Goal: LTG Patient will bathe with assist, cues/equipment (OT) LTG: Patient will bathe specified number of body parts with assist with/without cues using equipment (position) (OT)  Outcome: Completed/Met Date Met:  03/15/14  Problem: RH Dressing Goal: LTG Patient will perform upper body dressing (OT) LTG Patient will perform upper body dressing with assist, with/without cues (OT).  Outcome: Completed/Met Date Met:  03/15/14 Goal: LTG Patient will perform lower body dressing w/assist (OT) LTG: Patient will perform lower body dressing with assist, with/without cues in positioning using equipment (OT)  Outcome: Completed/Met Date Met:  03/15/14  Problem: RH Toileting Goal: LTG Patient will perform toileting w/assist, cues/equip (OT) LTG: Patient will perform toiletiing (clothes management/hygiene) with assist, with/without cues using equipment (OT)  Outcome: Completed/Met Date Met:  03/15/14  Problem: RH Simple Meal Prep Goal: LTG Patient will perform simple meal prep w/assist (OT) LTG: Patient will perform simple meal prep with assistance, with/without cues (OT).  Outcome: Completed/Met Date Met:  03/15/14  Problem: RH Toilet Transfers Goal: LTG Patient will perform toilet transfers w/assist (OT) LTG: Patient will perform toilet transfers with assist, with/without cues using equipment (OT)  Outcome: Completed/Met Date Met:  03/15/14  Problem: RH Awareness Goal: LTG: Patient will demonstrate intellectual/emergent  (OT) LTG: Patient will demonstrate intellectual/emergent/anticipatory awareness with assist during a functional activity (OT)  Outcome: Completed/Met Date Met:  03/15/14

## 2014-03-15 NOTE — Progress Notes (Signed)
Occupational Therapy Discharge Summary  Patient Details  Name: [name] [name] MRN: [id number] Date of Birth: Jan 07, 1951  Today's Date: 03/15/2014 OT Individual Time: [phone] and [id number] OT Individual Time Calculation (min): 60 min and 28 min     Patient has met 9 of 9 long term goals due to improved activity tolerance, improved balance, postural control and ability to compensate for deficits.  Patient to discharge at overall Supervision level.  Patient's care partner is independent to provide the necessary physical and cognitive assistance at discharge. Provided extensive education to patient's family in regards to precautions, TLSO, home recommendations, and greatest barriers at this time. Patient's family aware that patient reporting being non compliant to recommendations at this time, however, verbalized they will enforce recommendations 24/7.     Reasons goals not met: N/A. All LTGs met.   Recommendation:  Patient will benefit from ongoing skilled OT services in home health setting to continue to advance functional skills in the area of BADLs, minimize fall risk, safety awareness, balance, and activity tolerance.  Equipment: No equipment provided  Reasons for discharge: treatment goals met and discharge from hospital  Patient/family agrees with progress made and goals achieved: Yes  Skilled Therapeutic Intervention Session 1: [name] [name] seen for ADL retraining with focus on safety awareness, standing balance, activity tolerance, and functional mobility. [name] [name] received sitting in recliner chair. Informed [name] [name] to remain in chair until therapist retrieve items for bathing. Upon return to room, [name] [name] was standing at sink without TLSO. Donned TLSO then engaged in extensive conversation in regards to importance of wearing TLSO when OOB, fall risk, and safety plan. [name] [name] rolling eyes and stating "you just want me to wear this so you make money." Educated on reason for wearing brace, however [name] [name] continues to  verbalize non-compliance with precautions once returned to home. Completed bathing at sink from w/c at sit<>stand level, requiring supervision with min cues for adherence to precautions. [name] [name] donned brace with increased time and cues for tightening brace. Ambulated to ADL apartment to complete bed mobility and simple meal prep task. [name] [name] doffed/ donned brace sitting EOB then completed supine<>sit at mod I level. In kitchen, practiced transferring items counter<>coutner and retrieving items from cabinet. Educated on use of water bottle, tupperware, etc to place in RW bag to prevent spillage and minimize fall risk. [name] [name] agreeable to these recommendations. Upon return to room [name] [name] ambulated to toilet and completed toileting at supervision. [name] [name] left sitting in recliner with all needs in reach.  Session 2: [name] [name] seen for 1:1 OT session with focus on family education, functional mobility, and activity tolerance. [name] [name] received sitting in recliner chair with family present. Completed education with [name] [name] and family focusing on precautions, wearing schedule of TLSO, use of RW, and supervision goals. Emphasis on wearing of TLSO as [name] [name] stating throughout session that he was not going to wear it. [name] [name] demonstrated ability to don/doff brace with cues for tightening brace for correct fit. [name] [name]'s family asking appropriate questions throughout and agreed to provide cues to ensure safety of [name] [name] for adhering to precautions. [name] [name] completed toilet task and transfer via ambulation with RW at distant supervision level. [name] [name] propelled self in w/c and transferred to bed with RW then completed supine<>sit with use of log roll technique at Mod I level to demonstrate for family. [name] [name] and family with no questions or concerns. [name] [name] left sitting in w/c waiting for [name] [name] session.   OT Discharge Precautions/Restrictions  Precautions Precautions: Back;Fall Required Braces or Orthoses: Spinal Brace Spinal  Brace: Thoracolumbosacral orthotic;Applied in sitting  position Restrictions Weight Bearing Restrictions: Yes LLE Weight Bearing: Non weight bearing Other Position/Activity Restrictions: Discussed WB status at length. Reviewed back precautions.  General   Vital Signs Therapy Vitals Temp: 98.3 F (36.8 C) Temp Source: Oral Pulse Rate: 77 Resp: 18 BP: 136/70 mmHg Patient Position (if appropriate): Sitting Oxygen Therapy SpO2: 97 % O2 Device: Not Delivered Pain Pain Assessment Pain Assessment: No/denies pain Pain Score: 0-No pain Pain Type: Acute pain Pain Location: Rib cage Pain Orientation: Left Pain Descriptors / Indicators: Aching Pain Intervention(s): Medication (See eMAR) ADL   Vision/Perception  Vision- History Baseline Vision/History: Wears glasses Wears Glasses: At all times Patient Visual Report: No change from baseline Vision- Assessment Vision Assessment?: No apparent visual deficits  Cognition Overall Cognitive Status: Impaired/Different from baseline Arousal/Alertness: Awake/alert Orientation Level: Oriented X4 Attention: Selective Sustained Attention: Appears intact Selective Attention: Appears intact Memory: Impaired Memory Impairment: Decreased recall of new information Awareness: Impaired Awareness Impairment: Emergent impairment Problem Solving: Impaired Problem Solving Impairment: Functional basic Safety/Judgment: Impaired [employer] Scales of Cognitive Functioning: Automatic/appropriate Sensation Sensation Light Touch: Appears Intact Hot/Cold: Appears Intact Proprioception: Appears Intact Coordination Gross Motor Movements are Fluid and Coordinated: No Fine Motor Movements are Fluid and Coordinated: Yes Coordination and Movement Description: NWB L LE and impulsive Motor  Motor Motor: Within Functional Limits Mobility  Bed Mobility Bed Mobility: Supine to Sit;Sit to Supine Supine to Sit: 6: Modified independent (Device/Increase time);HOB flat Sit to Supine: 6: Modified  independent (Device/Increase time);HOB flat Transfers Transfers: Sit to Stand;Stand to Sit Sit to Stand: 5: Supervision;With armrests;From bed;From chair/3-in-1;From toilet;With upper extremity assist Sit to Stand Details: Verbal cues for precautions/safety Stand to Sit: 5: Supervision;To chair/3-in-1;To bed;To toilet;With armrests;With upper extremity assist Stand to Sit Details (indicate cue type and reason): Verbal cues for precautions/safety  Trunk/Postural Assessment  Cervical Assessment Cervical Assessment: Within Functional Limits Thoracic Assessment Thoracic Assessment: Exceptions to [facility] Thoracic AROM Overall Thoracic AROM Comments: limited by LSO and back precautions Lumbar Assessment Lumbar Assessment: Exceptions to [facility] Lumbar AROM Overall Lumbar AROM Comments: limited due to back precautions, TLSO Postural Control Postural Control: Within Functional Limits  Balance Balance Balance Assessed: Yes Static Standing Balance Static Standing - Balance Support: During functional activity;Bilateral upper extremity supported Static Standing - Level of Assistance: 5: Stand by assistance Dynamic Standing Balance Dynamic Standing - Balance Support: During functional activity Dynamic Standing - Level of Assistance: 5: Stand by assistance Extremity/Trunk Assessment RUE Assessment RUE Assessment: Within Functional Limits LUE Assessment LUE Assessment: Within Functional Limits  See FIM for current functional status  Duayne Cal 03/15/2014, 7:43 AM

## 2014-03-15 NOTE — Progress Notes (Signed)
Social Work  Discharge Note  The overall goal for the admission was met for:   Discharge location: Yes - d/c home with sister who can provide 24/7 supervision  Length of Stay: Yes- 11 days (with 11/14 discharge)  Discharge activity level: Yes - supervision/ min assist  Home/community participation: Yes  Services provided included: MD, RD, PT, OT, SLP, RN, TR, Pharmacy, Neuropsych and [place]: Private Insurance: [place] [employer]  Follow-up services arranged: Home Health: PT, OT, ST via [place], DME: 18x18 lightweight w/c with ELRs, cushion, rolling walker via [facility] and Patient/Family has no preference for HH/DME agencies  Comments (or additional information):  Patient/Family verbalized understanding of follow-up arrangements: Yes  Individual responsible for coordination of the follow-up plan: patient  Confirmed correct DME delivered: Chealsey Miyamoto 03/15/2014    [place], [place]

## 2014-03-15 NOTE — Progress Notes (Signed)
Speech Language Pathology Discharge Summary  Patient Details  Name: [name] [name] MRN: [id number] Date of Birth: 01-Jul-1950  Today's Date: 03/15/2014 SLP Individual Time: 1002-1038 SLP Individual Time Calculation (min): 36 min   Skilled Therapeutic Interventions:   Pt was seen for skilled ST targeting family education prior to discharge.  Upon arrival, pt's sister Cyndra Numbers) and sister in law Holley Raring) were present.  Pt was seated upright in wheelchair, awake, alert, and agreeable to participate in ST.  SLP facilitated the session with skilled education related to pt's currently prescribed diet and recommended swallowing precautions, including trials of regular water following oral care per the parameters of the water protocol.  SLP reviewed results of most recent FEES including presence of silent penetration as well as normal and abnormal swallowing anatomy and physiology to provide rational behind pt's currently prescribed diet.  Pt returned demonstration of how to thicken liquids to nectar consistency and utilized teach back methods to educate his family on the process with mod I.  SLP provided handouts of dys 3 consistencies, thickened liquids,the water protocol, and swallowing precautions to pt's family to facilitate carryover in the home environment.  SLP recommended follow up ST at discharge to maximize cognitive and swallowing function and reduce burden of care.  All pt's and pt's family's questions were answered to their satisfaction at this time.  Pt is on track for discharge tomorrow.      Patient has met 6 of 6 long term goals.  Patient to discharge at overall Supervision;Min level.  Reasons goals not met: n/a   Clinical Impression/Discharge Summary:  Pt made functional gains while inpatient and has met 6 out of 6 long term goals due to improved swallowing function and use of swallowing precautions as well as improved problem solving, awareness, and use of compensatory strategies to  facilitate improved recall of daily information.  Pt requires overall supervision-min assist for cognitive tasks and is mod I for use of swallowing precautions during meals to minimize overt s/s of aspiration with dys 3 solids and nectar thick liquids.  Pt has also been tolerating trials of regular water following thorough oral care per the water protocol to continue working towards liquids advancement given small amounts of silent penetration noted on most recent FEES which was related to weakness and decreased pharyngeal sensation secondary to intubation.  Pt is discharging to sister's home with 24/7 supervision.  It is recommended that he have follow up ST services upon discharge in order to continue to maximize functional independence and reduce burden of care.  Also recommend that pt have assistance for medication and financial management at discharge.  All family education is complete at this time.      Care Partner:  Caregiver Able to Provide Assistance: Yes  Type of Caregiver Assistance: Physical;Cognitive  Recommendation:  24 hour supervision/assistance;Home Health SLP  Rationale for SLP Follow Up: Maximize cognitive function and independence;Reduce caregiver burden;Maximize swallowing safety   Equipment: none recommended by SLP   Reasons for discharge: Discharged from hospital   Patient/Family Agrees with Progress Made and Goals Achieved: Yes   See FIM for current functional status  Windell Moulding L 03/15/2014, 12:04 PM

## 2014-03-15 NOTE — Plan of Care (Signed)
Problem: RH Swallowing Goal: LTG Patient will consume least restrictive PO diet (SLP) LTG: Patient will consume least restrictive PO diet with assist for use of compensatory strategies (SLP)  Outcome: Completed/Met Date Met:  03/15/14 Goal: LTG Patient will participate in dysphagia therapy (SLP) LTG: Patient will participate in dysphagia therapy with assist to increase swallow function as evidenced by bedside or objective clinical assessment (SLP)  Outcome: Completed/Met Date Met:  03/15/14 Goal: LTG Patient will demonstrate a functional change in (SLP) LTG: Patient will demonstrate a functional change in oral/oropharyngeal swallow as evidenced by an objective assessment (SLP)  Outcome: Completed/Met Date Met:  03/15/14  Problem: RH Problem Solving Goal: LTG Patient will demonstrate problem solving for (SLP) LTG: Patient will demonstrate problem solving for basic/complex daily situations with cues (SLP)  Outcome: Completed/Met Date Met:  03/15/14  Problem: RH Memory Goal: LTG Patient will use memory compensatory aids to (SLP) LTG: Patient will use memory compensatory aids to recall biographical/new, daily complex information with cues (SLP)  Outcome: Completed/Met Date Met:  03/15/14  Problem: RH Awareness Goal: LTG: Patient will demonstrate intellectual/emergent (SLP) LTG: Patient will demonstrate intellectual/emergent/anticipatory awareness with assist during a cognitive/linguistic activity (SLP)  Outcome: Completed/Met Date Met:  03/15/14
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
## 2014-03-16 NOTE — Plan of Care (Signed)
Problem: RH PAIN MANAGEMENT Goal: RH STG PAIN MANAGED AT OR BELOW PT'S PAIN GOAL Less than 3 out of 10  Outcome: Completed/Met Date Met:  03/16/14  Problem: RH KNOWLEDGE DEFICIT Goal: RH STG INCREASE KNOWLEDGE OF HYPERTENSION Outcome: Completed/Met Date Met:  03/16/14 Goal: RH STG INCREASE KNOWLEDGE OF DYSPHAGIA/FLUID INTAKE Outcome: Completed/Met Date Met:  03/16/14

## 2014-03-16 NOTE — Progress Notes (Signed)
Patient discharged to home with sister about 1136. Patient given discharge instructions yesterday via Algis Liming PA. Vitals stable. Suture removed from right chest. Area pink, no bleeding. Dry gauze applied. Small pink open blister? Below site. Dry gauze applied. Patient educated on knowing signs/symptoms of infection. All questions answered for patient and family. Patient discharged with all equipment and belongings.

## 2014-04-03 DIAGNOSIS — S82852D Displaced trimalleolar fracture of left lower leg, subsequent encounter for closed fracture with routine healing: Secondary | ICD-10-CM

## 2014-04-03 DIAGNOSIS — S065X9D Traumatic subdural hemorrhage with loss of consciousness of unspecified duration, subsequent encounter: Secondary | ICD-10-CM

## 2014-04-03 DIAGNOSIS — S2241XD Multiple fractures of ribs, right side, subsequent encounter for fracture with routine healing: Secondary | ICD-10-CM

## 2014-04-03 DIAGNOSIS — S2242XD Multiple fractures of ribs, left side, subsequent encounter for fracture with routine healing: Secondary | ICD-10-CM

## 2014-04-16 ENCOUNTER — Encounter: Payer: Self-pay | Admitting: Physical Medicine & Rehabilitation

## 2014-04-16 ENCOUNTER — Encounter: Payer: Medicare Other | Attending: Physical Medicine & Rehabilitation | Admitting: Physical Medicine & Rehabilitation

## 2014-04-16 VITALS — BP 195/80 | HR 52 | Resp 14 | Ht 67.0 in | Wt 184.0 lb

## 2014-04-16 DIAGNOSIS — K7030 Alcoholic cirrhosis of liver without ascites: Secondary | ICD-10-CM | POA: Insufficient documentation

## 2014-04-16 DIAGNOSIS — E785 Hyperlipidemia, unspecified: Secondary | ICD-10-CM | POA: Diagnosis not present

## 2014-04-16 DIAGNOSIS — I10 Essential (primary) hypertension: Secondary | ICD-10-CM | POA: Insufficient documentation

## 2014-04-16 DIAGNOSIS — S069X3S Unspecified intracranial injury with loss of consciousness of 1 hour to 5 hours 59 minutes, sequela: Secondary | ICD-10-CM

## 2014-04-16 DIAGNOSIS — Z86718 Personal history of other venous thrombosis and embolism: Secondary | ICD-10-CM | POA: Diagnosis not present

## 2014-04-16 DIAGNOSIS — S82852S Displaced trimalleolar fracture of left lower leg, sequela: Secondary | ICD-10-CM

## 2014-04-16 DIAGNOSIS — Z8781 Personal history of (healed) traumatic fracture: Secondary | ICD-10-CM | POA: Insufficient documentation

## 2014-04-16 DIAGNOSIS — S069X0S Unspecified intracranial injury without loss of consciousness, sequela: Secondary | ICD-10-CM | POA: Insufficient documentation

## 2014-04-16 DIAGNOSIS — F1021 Alcohol dependence, in remission: Secondary | ICD-10-CM | POA: Diagnosis not present

## 2014-04-16 DIAGNOSIS — Z7901 Long term (current) use of anticoagulants: Secondary | ICD-10-CM | POA: Insufficient documentation

## 2014-04-16 DIAGNOSIS — I509 Heart failure, unspecified: Secondary | ICD-10-CM | POA: Diagnosis not present

## 2014-04-16 DIAGNOSIS — S32010S Wedge compression fracture of first lumbar vertebra, sequela: Secondary | ICD-10-CM
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
## 2014-04-16 NOTE — Patient Instructions (Signed)
CHECK WITH YOUR PCP REGARDING YOUR BLOOD PRESSURE

## 2014-05-12 ENCOUNTER — Other Ambulatory Visit: Payer: Self-pay | Admitting: Physical Medicine and Rehabilitation

## 2014-05-20 ENCOUNTER — Encounter: Payer: Self-pay | Admitting: Gastroenterology

## 2014-07-10 ENCOUNTER — Other Ambulatory Visit (INDEPENDENT_AMBULATORY_CARE_PROVIDER_SITE_OTHER): Payer: Medicare Other

## 2014-07-10 ENCOUNTER — Encounter: Payer: Self-pay | Admitting: Gastroenterology

## 2014-07-10 ENCOUNTER — Ambulatory Visit (INDEPENDENT_AMBULATORY_CARE_PROVIDER_SITE_OTHER): Payer: Medicare Other | Admitting: Gastroenterology

## 2014-07-10 VITALS — BP 142/70 | HR 60 | Ht 64.5 in | Wt 200.1 lb

## 2014-07-10 DIAGNOSIS — I8510 Secondary esophageal varices without bleeding: Secondary | ICD-10-CM

## 2014-07-10 DIAGNOSIS — I8500 Esophageal varices without bleeding: Secondary | ICD-10-CM

## 2014-07-10 DIAGNOSIS — K7030 Alcoholic cirrhosis of liver without ascites: Secondary | ICD-10-CM

## 2014-07-10 DIAGNOSIS — Z860100 Personal history of colon polyps, unspecified: Secondary | ICD-10-CM

## 2014-07-10 LAB — CBC WITH DIFFERENTIAL/PLATELET
Basophils Absolute: 0.1 10*3/uL (ref 0.0–0.1)
Basophils Relative: 0.8 % (ref 0.0–3.0)
EOS PCT: 3.8 % (ref 0.0–5.0)
Eosinophils Absolute: 0.3 10*3/uL (ref 0.0–0.7)
HEMATOCRIT: 41.7 % (ref 39.0–52.0)
Hemoglobin: 14.2 g/dL (ref 13.0–17.0)
Lymphocytes Relative: 22.4 % (ref 12.0–46.0)
Lymphs Abs: 1.5 10*3/uL (ref 0.7–4.0)
MCHC: 34.1 g/dL (ref 30.0–36.0)
MCV: 83.2 fl (ref 78.0–100.0)
MONOS PCT: 11.2 % (ref 3.0–12.0)
Monocytes Absolute: 0.8 10*3/uL (ref 0.1–1.0)
Neutro Abs: 4.2 10*3/uL (ref 1.4–7.7)
Neutrophils Relative %: 61.8 % (ref 43.0–77.0)
Platelets: 198 10*3/uL (ref 150.0–400.0)
RBC: 5.01 Mil/uL (ref 4.22–5.81)
RDW: 17.4 % — ABNORMAL HIGH (ref 11.5–15.5)
WBC: 6.8 10*3/uL (ref 4.0–10.5)

## 2014-07-10 LAB — PROTIME-INR
INR: 1 ratio (ref 0.8–1.0)
Prothrombin Time: 11.4 s (ref 9.6–13.1)

## 2014-07-10 NOTE — Assessment & Plan Note (Signed)
Stable cirrhosis with portal hypertension.    Recommendations #1 check INR and alpha-fetoprotein

## 2014-07-10 NOTE — Patient Instructions (Signed)
You have been scheduled for a colonoscopy. Please follow written instructions given to you at your visit today.  Please pick up your prep supplies at the pharmacy within the next 1-3 days. If you use inhalers (even only as needed), please bring them with you on the day of your procedure. Your physician has requested that you go to www.startemmi.com and enter the access code given to you at your visit today. This web site gives a general overview about your procedure. However, you should still follow specific instructions given to you by our office regarding your preparation for the procedure.  You have been scheduled for an endoscopy. Please follow written instructions given to you at your visit today. If you use inhalers (even only as needed), please bring them with you on the day of your procedure. Your physician has requested that you go to www.startemmi.com and enter the access code given to you at your visit today. This web site gives a general overview about your procedure. However, you should still follow specific instructions given to you by our office regarding your preparation for the procedure.  Go to the basement for labs today

## 2014-07-10 NOTE — Assessment & Plan Note (Signed)
Plan followup colonoscopy 
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
## 2014-07-10 NOTE — Assessment & Plan Note (Signed)
Plan to continue propranolol.  Patient is due for follow-up endoscopy.

## 2014-07-11 LAB — AFP TUMOR MARKER: AFP TUMOR MARKER: 1.3 ng/mL (ref <6.1)

## 2014-07-22 ENCOUNTER — Encounter: Payer: Self-pay | Admitting: Gastroenterology

## 2014-07-22 ENCOUNTER — Other Ambulatory Visit: Payer: Self-pay | Admitting: Gastroenterology

## 2014-07-22 ENCOUNTER — Ambulatory Visit (AMBULATORY_SURGERY_CENTER): Payer: Medicare Other | Admitting: Gastroenterology

## 2014-07-22 VITALS — BP 121/64 | HR 51 | Temp 96.8°F | Resp 25 | Ht 64.5 in | Wt 200.0 lb

## 2014-07-22 DIAGNOSIS — K5730 Diverticulosis of large intestine without perforation or abscess without bleeding: Secondary | ICD-10-CM

## 2014-07-22 DIAGNOSIS — D123 Benign neoplasm of transverse colon: Secondary | ICD-10-CM

## 2014-07-22 DIAGNOSIS — D122 Benign neoplasm of ascending colon: Secondary | ICD-10-CM | POA: Diagnosis not present

## 2014-07-22 DIAGNOSIS — Z86010 Personal history of colonic polyps: Secondary | ICD-10-CM

## 2014-07-22 MED ORDER — SODIUM CHLORIDE 0.9 % IV SOLN: 500.0000 mL | INTRAVENOUS | Status: DC

## 2014-07-22 NOTE — Progress Notes (Signed)
Report to PACU, RN, vss, BBS= Clear.  

## 2014-07-22 NOTE — Op Note (Signed)
[facility]  [address] [place], [address]   COLONOSCOPY PROCEDURE REPORT  PATIENT: [name] [name], [name] [name]  MR#: [id number] BIRTHDATE: Jul 09, 1950 , 63  yrs. old GENDER: male ENDOSCOPIST: Inda Castle, MD REFERRED XB:[id number] Arelia Sneddon, M.D. PROCEDURE DATE:  07/22/2014 PROCEDURE:   Colonoscopy with snare polypectomy, Colonoscopy with cold biopsy polypectomy, and Colonoscopy, surveillance First Screening Colonoscopy - Avg.  risk and is 50 yrs.  old or older - No.  Prior Negative Screening - Now for repeat screening. N/A  History of Adenoma - Now for follow-up colonoscopy & has been > or = to 3 yrs.  Yes hx of adenoma.  Has been 3 or more years since last colonoscopy. ASA CLASS:   Class III INDICATIONS:high risk patient with personal history of colonic polyps.  2013 MEDICATIONS: Monitored anesthesia care and Propofol 200 mg IV  DESCRIPTION OF PROCEDURE:   After the risks benefits and alternatives of the procedure were thoroughly explained, informed consent was obtained.  The digital rectal exam revealed no abnormalities of the rectum.   The [id number]  endoscope was introduced through the anus and advanced to the cecum, which was identified by both the appendix and ileocecal valve. No adverse events experienced.   The quality of the prep was (Suprep was used) excellent.  The instrument was then slowly withdrawn as the colon was fully examined.      COLON FINDINGS: A sessile polyp measuring 3 mm in size was found in the ascending colon.  A polypectomy was performed with a cold snare.  The resection was complete, the polyp tissue was completely retrieved and sent to histology.   A sessile polyp measuring 1 mm in size was found in the transverse colon.  A polypectomy was performed with cold forceps.   There was mild diverticulosis noted in the descending colon and sigmoid colon.  Retroflexed views revealed no abnormalities. The time to cecum = 2.6 Withdrawal  time = 12.7   The scope was withdrawn and the procedure completed. COMPLICATIONS: There were no immediate complications.  ENDOSCOPIC IMPRESSION: 1.   Sessile polyp was found in the ascending colon; polypectomy was performed with a cold snare 2.   Sessile polyp was found in the transverse colon; polypectomy was performed with cold forceps 3.   Mild diverticulosis was noted in the descending colon and sigmoid colon  RECOMMENDATIONS: If the polyp(s) removed today are proven to be adenomatous (pre-cancerous) polyps, you will need a repeat colonoscopy in 5 years.  Otherwise you should continue to follow colorectal cancer screening guidelines for "routine risk" patients with colonoscopy in 10 years.  You will receive a letter within 1-2 weeks with the results of your biopsy as well as final recommendations.  Please call my office if you have not received a letter after 3 weeks.  eSigned:  Inda Castle, MD 07/22/2014 1:43 PM   cc:   PATIENT NAME:  [name] [name], [name] [name] MR#: [id number]

## 2014-07-22 NOTE — Progress Notes (Signed)
Called to room to assist during endoscopic procedure.  Patient ID and intended procedure confirmed with present staff. Received instructions for my participation in the procedure from the performing physician.  

## 2014-07-22 NOTE — Patient Instructions (Signed)
Colon polyps removed today and diverticulosis seen. Handouts given on polyps, diverticulosis and high fiber diet. Call us with any questions or concerns. Thank you.   YOU HAD AN ENDOSCOPIC PROCEDURE TODAY AT [facility]:   Refer to the procedure report that was given to you for any specific questions about what was found during the examination.  If the procedure report does not answer your questions, please call your gastroenterologist to clarify.  If you requested that your care partner not be given the details of your procedure findings, then the procedure report has been included in a sealed envelope for you to review at your convenience later.  YOU SHOULD EXPECT: Some feelings of bloating in the abdomen. Passage of more gas than usual.  Walking can help get rid of the air that was put into your GI tract during the procedure and reduce the bloating. If you had a lower endoscopy (such as a colonoscopy or flexible sigmoidoscopy) you may notice spotting of blood in your stool or on the toilet paper. If you underwent a bowel prep for your procedure, you may not have a normal bowel movement for a few days.  Please Note:  You might notice some irritation and congestion in your nose or some drainage.  This is from the oxygen used during your procedure.  There is no need for concern and it should clear up in a day or so.  SYMPTOMS TO REPORT IMMEDIATELY:   Following lower endoscopy (colonoscopy or flexible sigmoidoscopy):  Excessive amounts of blood in the stool  Significant tenderness or worsening of abdominal pains  Swelling of the abdomen that is new, acute  Fever of 100F or higher   Following upper endoscopy (EGD)  Vomiting of blood or coffee ground material  New chest pain or pain under the shoulder blades  Painful or persistently difficult swallowing  New shortness of breath  Fever of 100F or higher  Black, tarry-looking stools  For urgent or emergent issues, a  gastroenterologist can be reached at any hour by calling [phone].   DIET: Your first meal following the procedure should be a small meal and then it is ok to progress to your normal diet. Heavy or fried foods are harder to digest and may make you feel nauseous or bloated.  Likewise, meals heavy in dairy and vegetables can increase bloating.  Drink plenty of fluids but you should avoid alcoholic beverages for 24 hours.  ACTIVITY:  You should plan to take it easy for the rest of today and you should NOT DRIVE or use heavy machinery until tomorrow (because of the sedation medicines used during the test).    FOLLOW UP: Our staff will call the number listed on your records the next business day following your procedure to check on you and address any questions or concerns that you may have regarding the information given to you following your procedure. If we do not reach you, we will leave a message.  However, if you are feeling well and you are not experiencing any problems, there is no need to return our call.  We will assume that you have returned to your regular daily activities without incident.  If any biopsies were taken you will be contacted by phone or by letter within the next 1-3 weeks.  Please call us at [phone] if you have not heard about the biopsies in 3 weeks.    SIGNATURES/CONFIDENTIALITY: You and/or your care partner have signed paperwork which will  be entered into your electronic medical record.  These signatures attest to the fact that that the information above on your After Visit Summary has been reviewed and is understood.  Full responsibility of the confidentiality of this discharge information lies with you and/or your care-partner. 

## 2014-07-23 ENCOUNTER — Telehealth: Payer: Self-pay | Admitting: *Deleted

## 2014-07-23 NOTE — Telephone Encounter (Signed)
Message left

## 2014-07-24 ENCOUNTER — Encounter: Payer: Self-pay | Admitting: Gastroenterology

## 2014-08-01 ENCOUNTER — Encounter: Payer: Self-pay | Admitting: Gastroenterology

## 2014-08-02 ENCOUNTER — Ambulatory Visit (AMBULATORY_SURGERY_CENTER): Payer: Medicare Other | Admitting: Gastroenterology

## 2014-08-02 ENCOUNTER — Encounter: Payer: Self-pay | Admitting: Gastroenterology

## 2014-08-02 VITALS — BP 131/71 | HR 51 | Temp 96.6°F | Resp 19 | Ht 64.0 in | Wt 200.0 lb

## 2014-08-02 DIAGNOSIS — K7030 Alcoholic cirrhosis of liver without ascites: Secondary | ICD-10-CM | POA: Diagnosis not present

## 2014-08-02 DIAGNOSIS — I8510 Secondary esophageal varices without bleeding: Secondary | ICD-10-CM

## 2014-08-02 DIAGNOSIS — K2970 Gastritis, unspecified, without bleeding: Secondary | ICD-10-CM

## 2014-08-02 DIAGNOSIS — K2990 Gastroduodenitis, unspecified, without bleeding: Secondary | ICD-10-CM | POA: Diagnosis not present

## 2014-08-02 DIAGNOSIS — K2950 Unspecified chronic gastritis without bleeding: Secondary | ICD-10-CM | POA: Diagnosis not present

## 2014-08-02 MED ORDER — SODIUM CHLORIDE 0.9 % IV SOLN: 500.0000 mL | INTRAVENOUS | Status: DC

## 2014-08-02 NOTE — Progress Notes (Signed)
Called to room to assist during endoscopic procedure.  Patient ID and intended procedure confirmed with present staff. Received instructions for my participation in the procedure from the performing physician.  

## 2014-08-02 NOTE — Op Note (Signed)
[facility]  [address] [place], [id number]   ENDOSCOPY PROCEDURE REPORT  PATIENT: [name] [name], [name] [name]  MR#: [id number] BIRTHDATE: 20-Oct-1950 , 63  yrs. old GENDER: male ENDOSCOPIST: Inda Castle, MD REFERRED BY:  Claris Gower, M.D. PROCEDURE DATE:  08/02/2014 PROCEDURE:  EGD w/ biopsy ASA CLASS:     Class III INDICATIONS:  screening for varices. MEDICATIONS: Monitored anesthesia care and Propofol 150 mg IV TOPICAL ANESTHETIC:  DESCRIPTION OF PROCEDURE: After the risks benefits and alternatives of the procedure were thoroughly explained, informed consent was obtained.  The LB [id number] endoscope was introduced through the mouth and advanced to the second portion of the duodenum , Without limitations.  The instrument was slowly withdrawn as the mucosa was fully examined.    Few enlarged but soft gastric folds in the antrum.  Biopsies were taken.   In this distal esophagus there were trace to 1+ varices. Except for the findings listed, the EGD was otherwise normal. The scope was then withdrawn from the patient and the procedure completed.  COMPLICATIONS: There were no immediate complications.  ENDOSCOPIC IMPRESSION: 1.  Trace to 1+ distal esophageal varices 2.  Enlarged gastric folds  RECOMMENDATIONS: Await pathology results  REPEAT EXAM: one year  eSigned:  Inda Castle, MD 08/02/2014 2:37 PM    CC:

## 2014-08-02 NOTE — Progress Notes (Signed)
Report to PACU, RN, vss, BBS= Clear.  

## 2014-08-02 NOTE — Patient Instructions (Signed)
YOU HAD AN ENDOSCOPIC PROCEDURE TODAY AT THE [facility]:   Refer to the procedure report that was given to you for any specific questions about what was found during the examination.  If the procedure report does not answer your questions, please call your gastroenterologist to clarify.  If you requested that your care partner not be given the details of your procedure findings, then the procedure report has been included in a sealed envelope for you to review at your convenience later.  YOU SHOULD EXPECT: Some feelings of bloating in the abdomen. Passage of more gas than usual.  Walking can help get rid of the air that was put into your GI tract during the procedure and reduce the bloating. If you had a lower endoscopy (such as a colonoscopy or flexible sigmoidoscopy) you may notice spotting of blood in your stool or on the toilet paper. If you underwent a bowel prep for your procedure, you may not have a normal bowel movement for a few days.  Please Note:  You might notice some irritation and congestion in your nose or some drainage.  This is from the oxygen used during your procedure.  There is no need for concern and it should clear up in a day or so.  SYMPTOMS TO REPORT IMMEDIATELY:    Following upper endoscopy (EGD)  Vomiting of blood or coffee ground material  New chest pain or pain under the shoulder blades  Painful or persistently difficult swallowing  New shortness of breath  Fever of 100F or higher  Black, tarry-looking stools  For urgent or emergent issues, a gastroenterologist can be reached at any hour by calling [phone].   DIET: Your first meal following the procedure should be a small meal and then it is ok to progress to your normal diet. Heavy or fried foods are harder to digest and may make you feel nauseous or bloated.  Likewise, meals heavy in dairy and vegetables can increase bloating.  Drink plenty of fluids but you should avoid alcoholic beverages  for 24 hours.  ACTIVITY:  You should plan to take it easy for the rest of today and you should NOT DRIVE or use heavy machinery until tomorrow (because of the sedation medicines used during the test).    FOLLOW UP: Our staff will call the number listed on your records the next business day following your procedure to check on you and address any questions or concerns that you may have regarding the information given to you following your procedure. If we do not reach you, we will leave a message.  However, if you are feeling well and you are not experiencing any problems, there is no need to return our call.  We will assume that you have returned to your regular daily activities without incident.  If any biopsies were taken you will be contacted by phone or by letter within the next 1-3 weeks.  Please call us at [phone] if you have not heard about the biopsies in 3 weeks.    SIGNATURES/CONFIDENTIALITY: You and/or your care partner have signed paperwork which will be entered into your electronic medical record.  These signatures attest to the fact that that the information above on your After Visit Summary has been reviewed and is understood.  Full responsibility of the confidentiality of this discharge information lies with you and/or your care-partner. 

## 2014-08-05 ENCOUNTER — Telehealth: Payer: Self-pay | Admitting: *Deleted

## 2014-08-05 NOTE — Telephone Encounter (Signed)
Left message on f/u callback 

## 2014-08-12 ENCOUNTER — Encounter: Payer: Self-pay | Admitting: Gastroenterology

## 2014-08-13 ENCOUNTER — Other Ambulatory Visit: Payer: Self-pay | Admitting: Dermatology

## 2016-01-10 IMAGING — CR DG KNEE 1-2V PORT*L*
2 series · 2 of 2 positions shown · non-contrast
Comparison: None.

CLINICAL DATA: Left knee effusion. Swelling for 1 day. Status post
ORIF of the ankle.

EXAM:
PORTABLE LEFT KNEE - 1-2 VIEW

[AP]
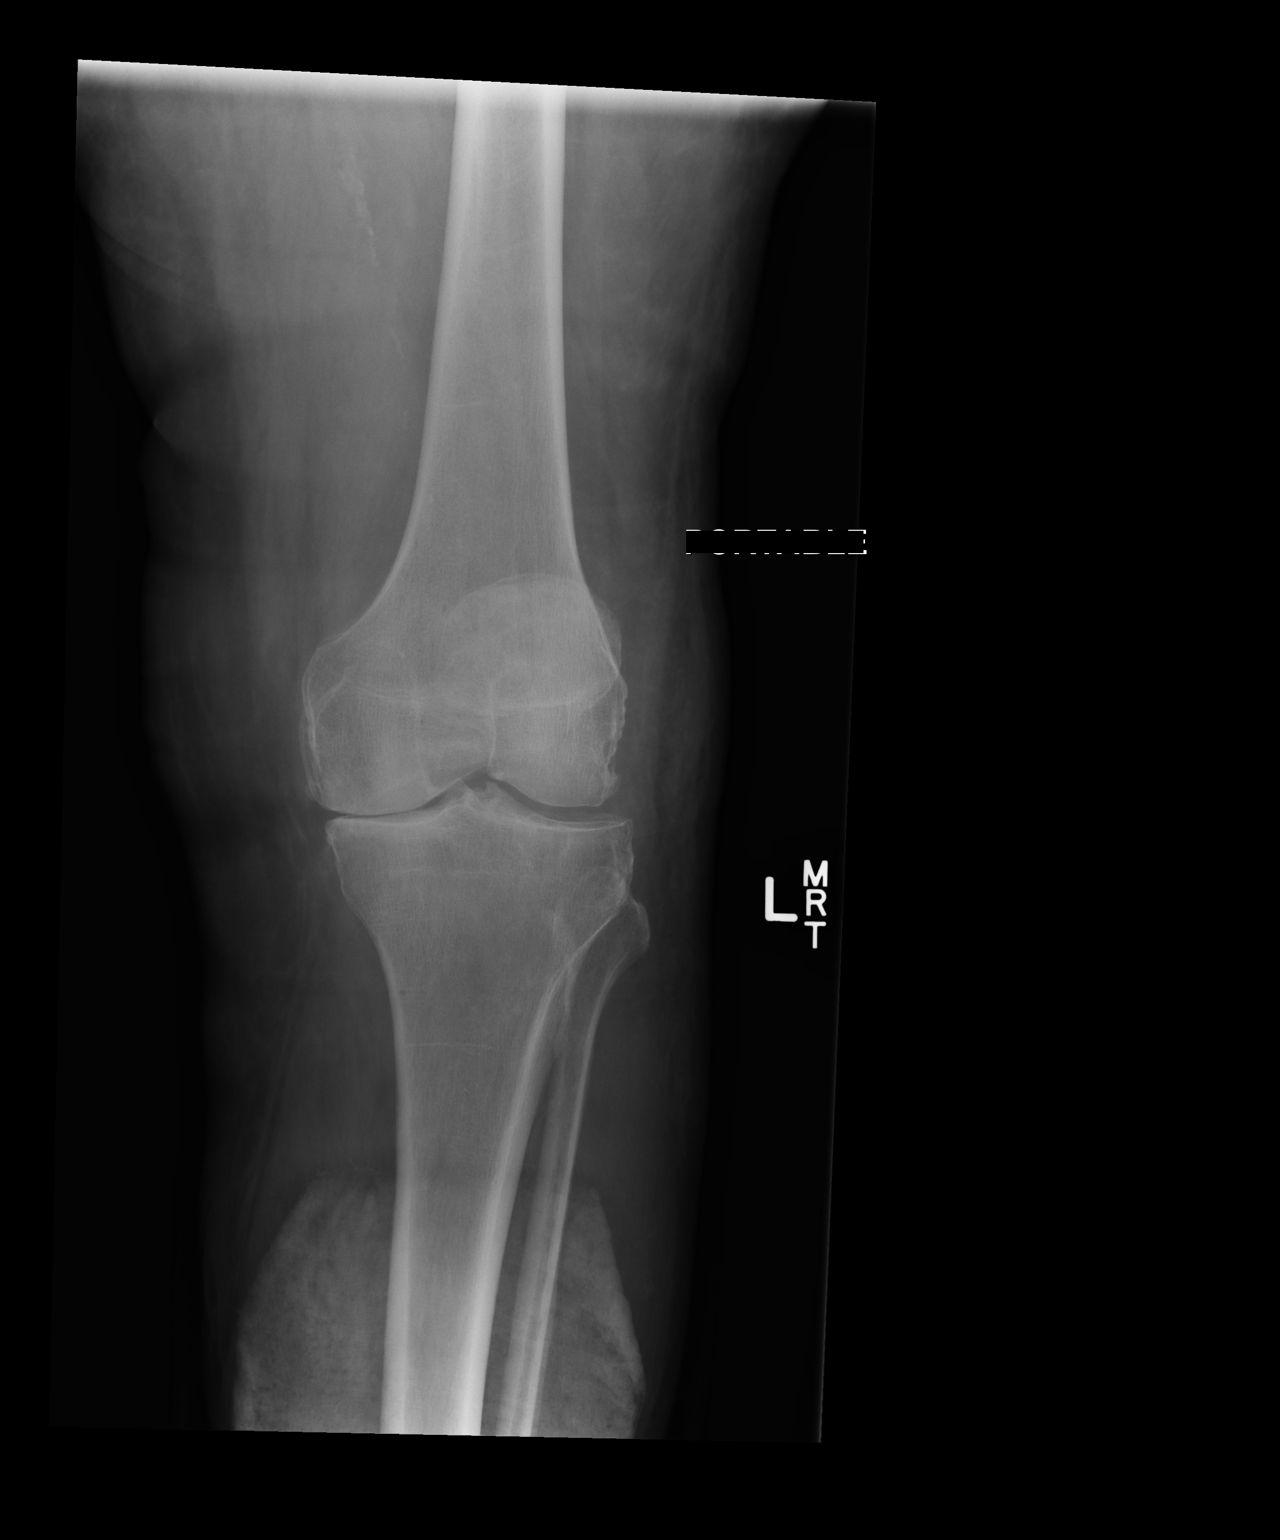

[xtable lateral]
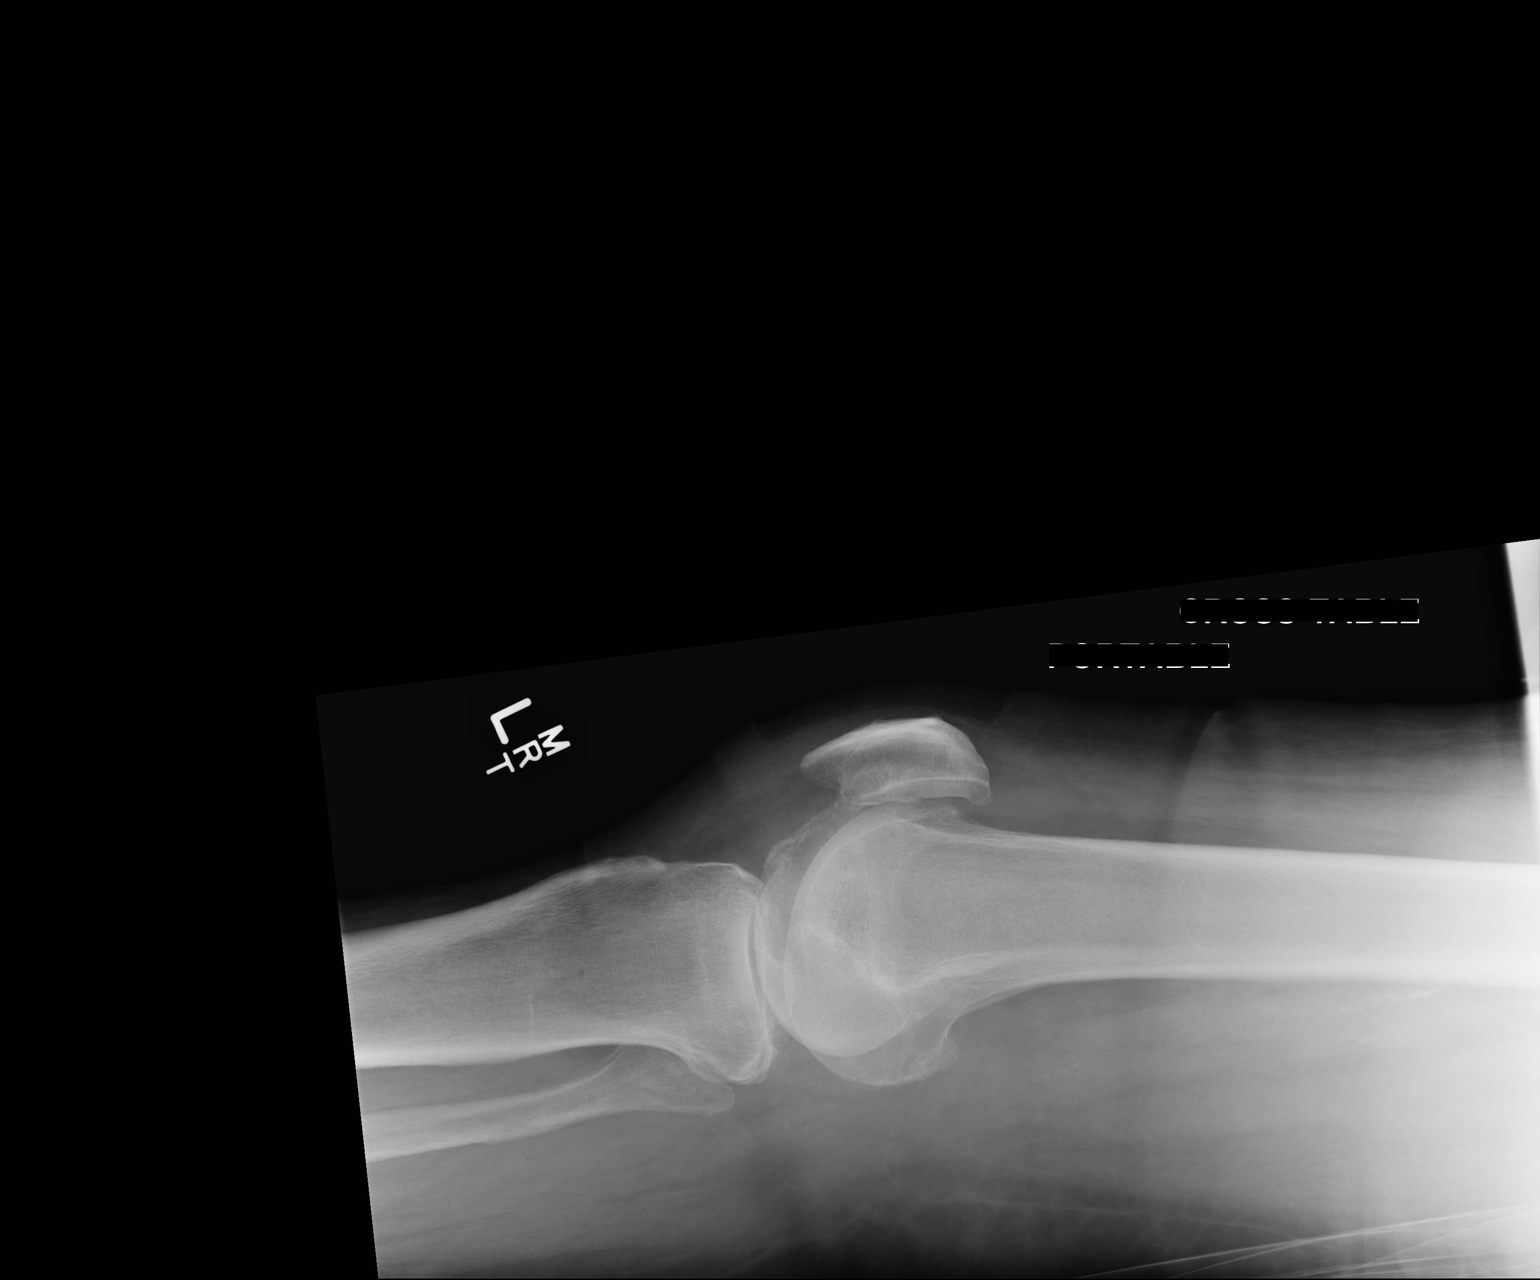

[2 of 2 positions shown; findings below may reference images not displayed]

FINDINGS: There is marked degenerative change involving the medial
compartment. Moderate patellofemoral compartment degenerative
changes also noted. Small joint effusion is present. There is no
acute fracture.
IMPRESSION: 1. Degenerative changes.
2. Small effusion.

## 2016-01-18 IMAGING — CR DG CHEST 1V PORT
1 series · 1 of 1 positions shown · non-contrast
Comparison: 02/20/2014

CLINICAL DATA: Traumatic hemopneumothorax

EXAM:
PORTABLE CHEST - 1 VIEW

[AP]
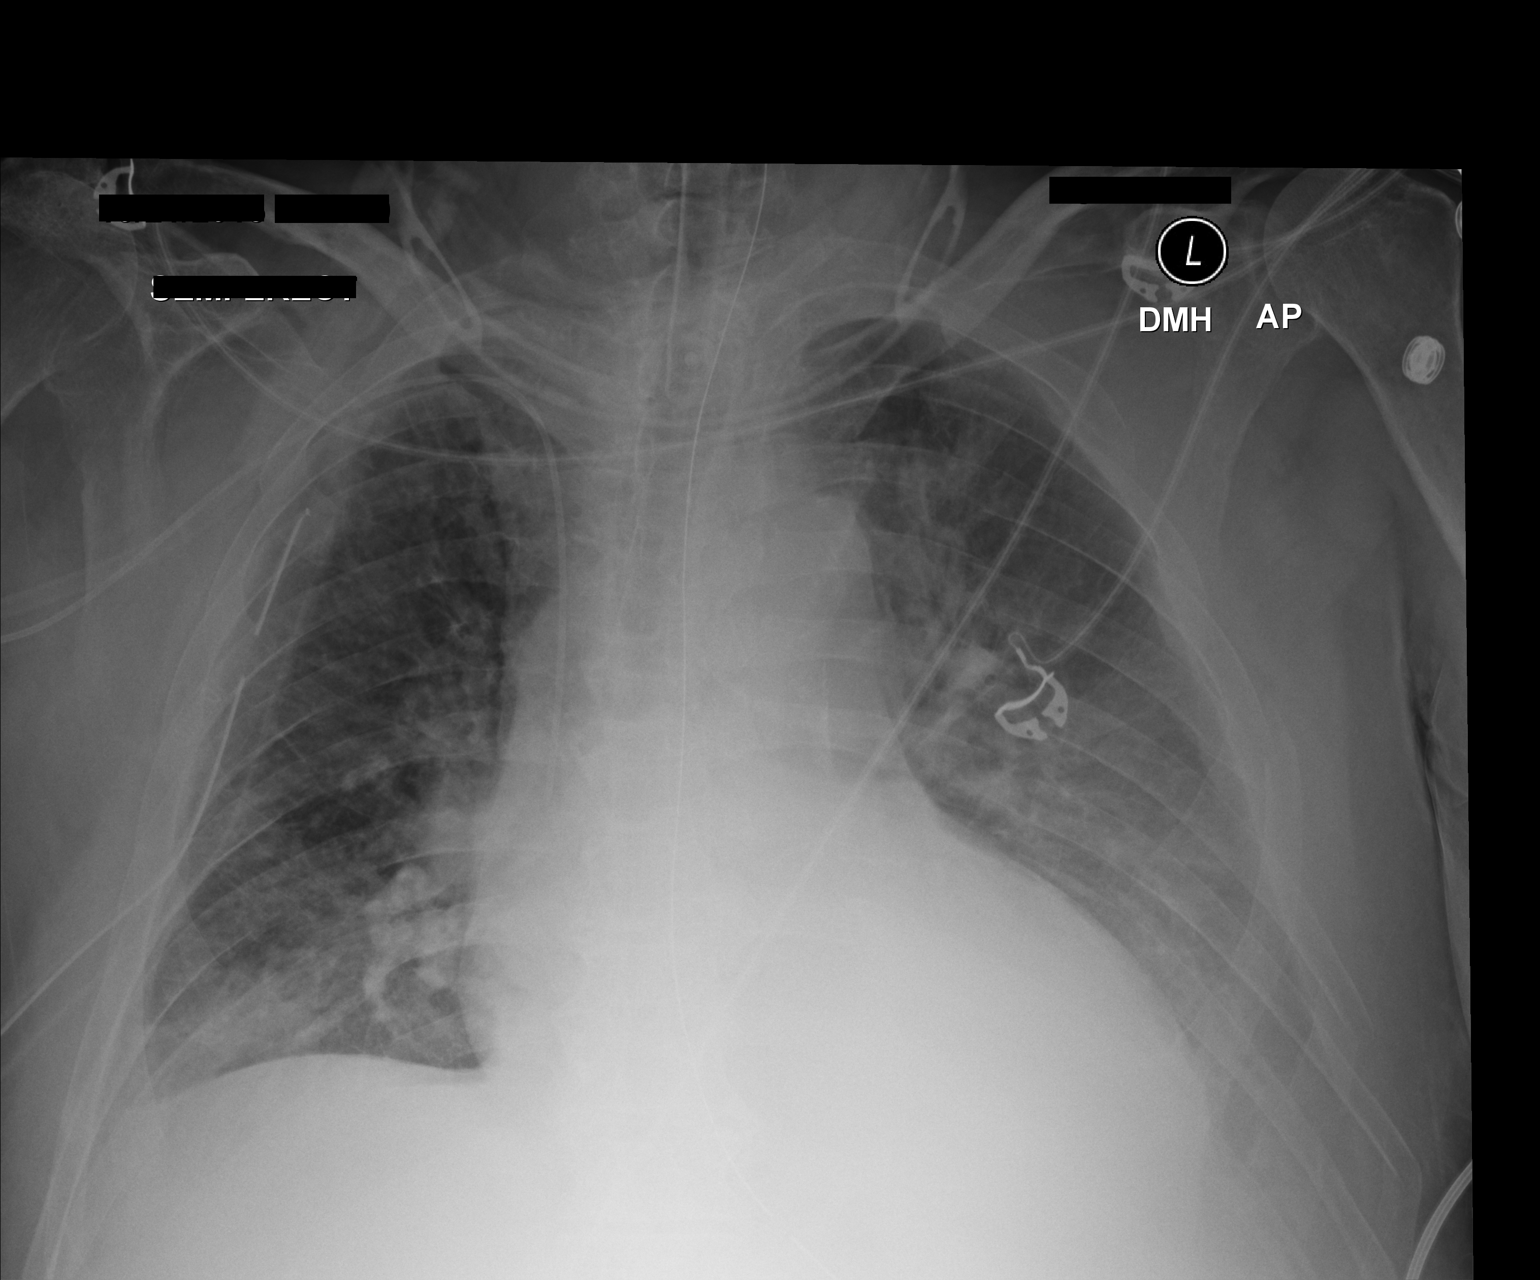

[1 of 1 positions shown; findings below may reference images not displayed]

FINDINGS: ET tube tip is above the carina. Nasogastric tube tip is in the
stomach. Right subclavian catheter tip is in the right cavoatrial
junction. Right chest tube is in place, no pneumothorax identified.
The heart size appears enlarged. There is bilateral interstitial and
airspace opacities which appear unchanged from previous exam.
IMPRESSION: 1. No significant pneumothorax identified. Stable position of right
chest tube.
2. No change in aeration to lungs.

## 2016-01-18 IMAGING — CR DG CHEST 1V PORT
2 series · 2 of 2 positions shown · non-contrast
Comparison: Portable chest x-ray of 02/19/2014

CLINICAL DATA: Respiratory failure, followup

EXAM:
PORTABLE CHEST - 1 VIEW

[AP (1 of 2)]
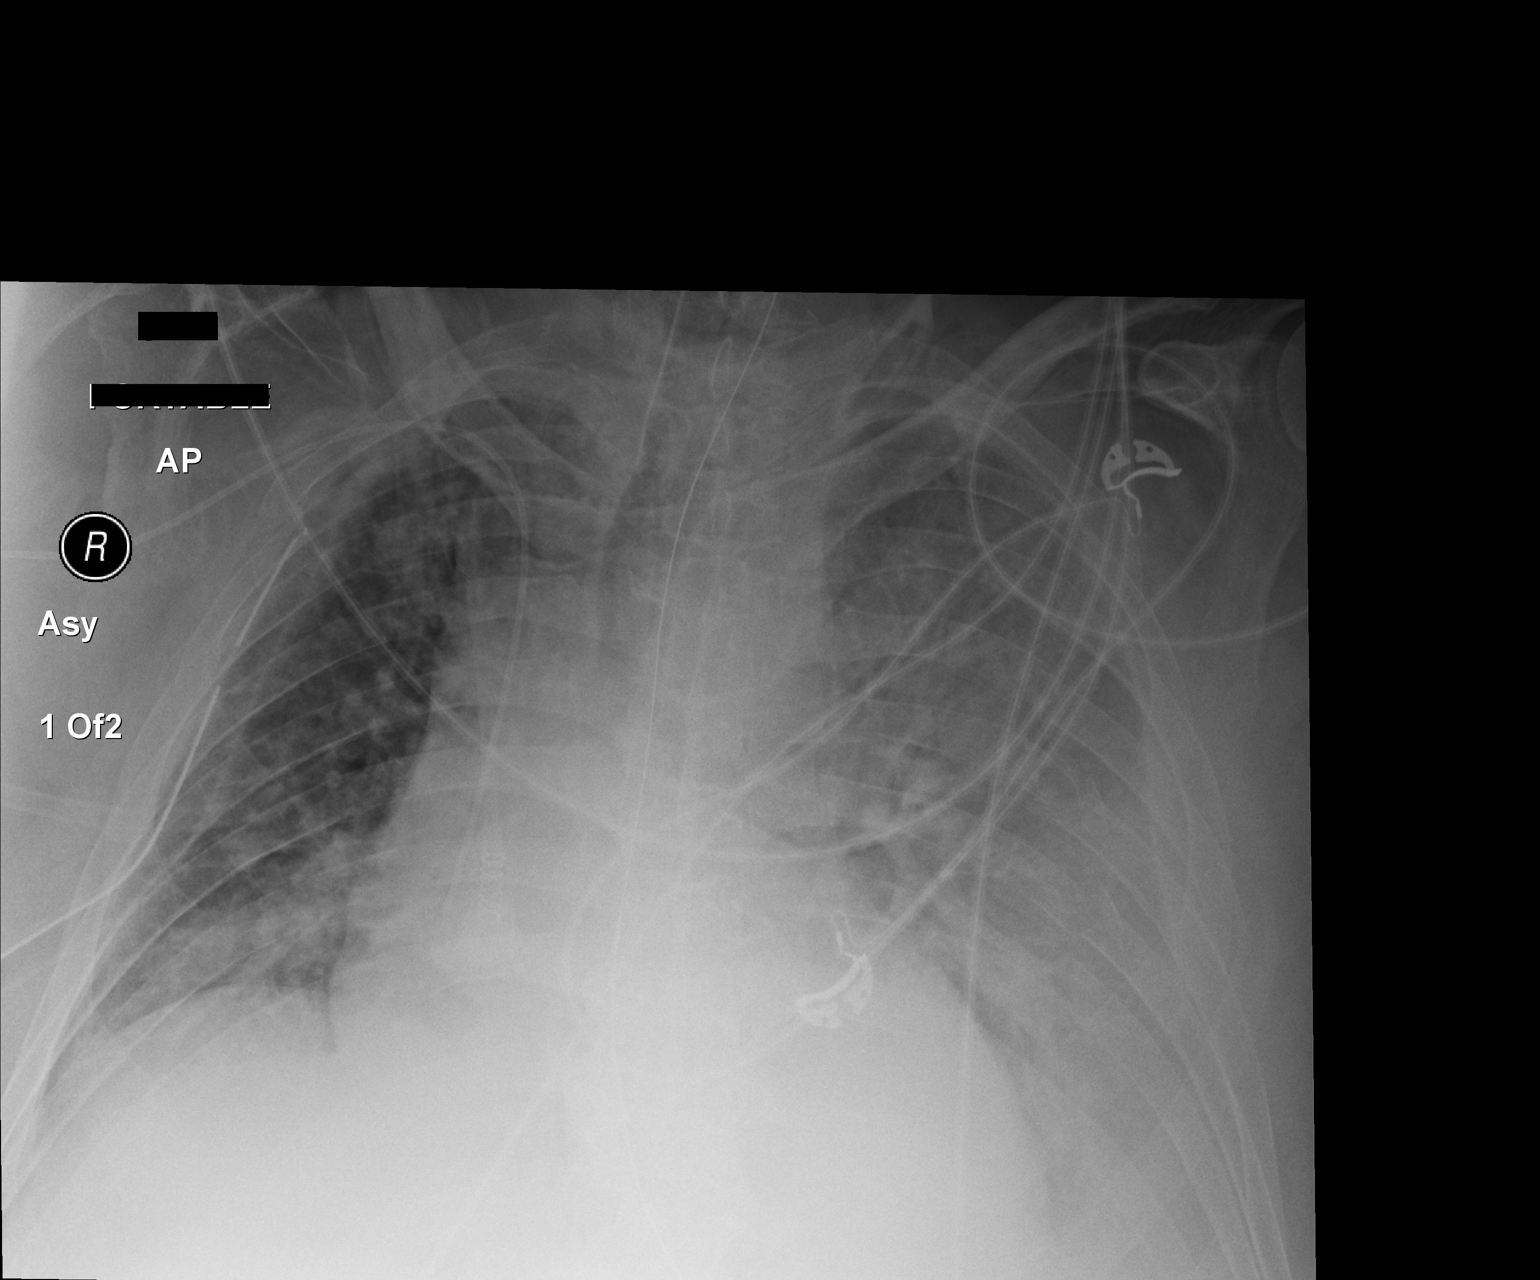

[AP (2 of 2)]
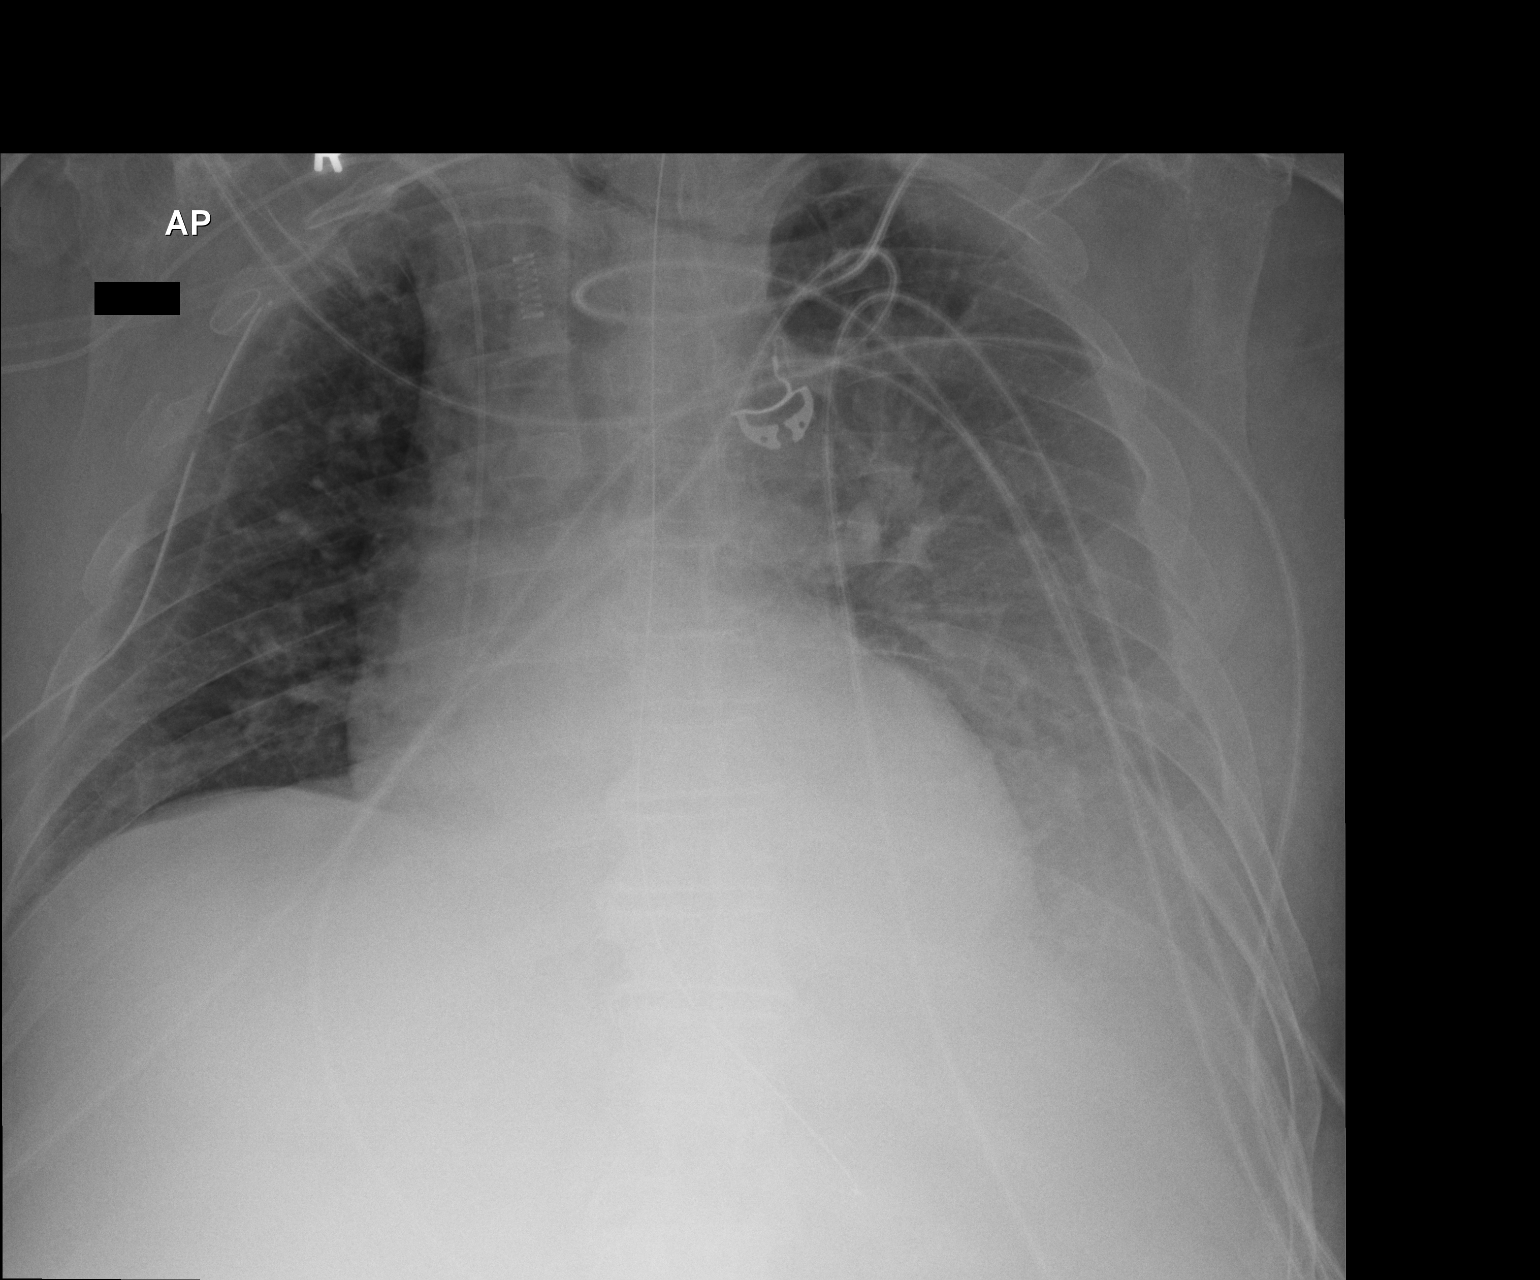

[2 of 2 positions shown; findings below may reference images not displayed]

FINDINGS: There is little change of in diffuse airspace disease with slightly
diminished aeration. The tip of the endotracheal tube is
approximately 6.5 cm above the carina. The right central venous line
tip is seen to the region of the SVC-RA junction. A right chest tube
is present and no definite pneumothorax is seen. There is vague
lucency peripherally in the right upper hemithorax and a tiny
pneumothorax on the right cannot be excluded. Cardiomegaly is
stable.
IMPRESSION: 1. Endotracheal tube tip 6.5 cm x 1.
2. Little change in diffuse airspace disease with slightly
diminished aeration.
3. No definite pneumothorax.  See above.

## 2018-06-30 ENCOUNTER — Other Ambulatory Visit: Payer: Self-pay

## 2018-06-30 ENCOUNTER — Emergency Department (HOSPITAL_COMMUNITY): Payer: Medicare Other

## 2018-06-30 ENCOUNTER — Observation Stay (HOSPITAL_COMMUNITY)
Admission: EM | Admit: 2018-06-30 | Discharge: 2018-07-02 | Disposition: A | Discharge status: Home / Self Care | Payer: Medicare Other | Attending: Family Medicine | Admitting: Family Medicine

## 2018-06-30 ENCOUNTER — Encounter (HOSPITAL_COMMUNITY): Payer: Self-pay | Admitting: Cardiology

## 2018-06-30 DIAGNOSIS — I5020 Unspecified systolic (congestive) heart failure: Secondary | ICD-10-CM | POA: Diagnosis present

## 2018-06-30 DIAGNOSIS — I5032 Chronic diastolic (congestive) heart failure: Secondary | ICD-10-CM | POA: Diagnosis present

## 2018-06-30 DIAGNOSIS — K7460 Unspecified cirrhosis of liver: Secondary | ICD-10-CM | POA: Diagnosis not present

## 2018-06-30 DIAGNOSIS — I5022 Chronic systolic (congestive) heart failure: Secondary | ICD-10-CM | POA: Diagnosis not present

## 2018-06-30 DIAGNOSIS — I8500 Esophageal varices without bleeding: Secondary | ICD-10-CM | POA: Diagnosis present

## 2018-06-30 DIAGNOSIS — I160 Hypertensive urgency: Secondary | ICD-10-CM

## 2018-06-30 DIAGNOSIS — R0789 Other chest pain: Principal | ICD-10-CM | POA: Diagnosis present

## 2018-06-30 DIAGNOSIS — F1010 Alcohol abuse, uncomplicated: Secondary | ICD-10-CM | POA: Diagnosis present

## 2018-06-30 DIAGNOSIS — I110 Hypertensive heart disease with heart failure: Secondary | ICD-10-CM | POA: Diagnosis not present

## 2018-06-30 DIAGNOSIS — K219 Gastro-esophageal reflux disease without esophagitis: Secondary | ICD-10-CM | POA: Diagnosis not present

## 2018-06-30 DIAGNOSIS — Z79899 Other long term (current) drug therapy: Secondary | ICD-10-CM | POA: Diagnosis not present

## 2018-06-30 DIAGNOSIS — R079 Chest pain, unspecified: Secondary | ICD-10-CM

## 2018-06-30 DIAGNOSIS — K7030 Alcoholic cirrhosis of liver without ascites: Secondary | ICD-10-CM | POA: Diagnosis not present

## 2018-06-30 DIAGNOSIS — Z91040 Latex allergy status: Secondary | ICD-10-CM | POA: Insufficient documentation

## 2018-06-30 LAB — CBC WITH DIFFERENTIAL/PLATELET
Abs Immature Granulocytes: 0.03 10*3/uL (ref 0.00–0.07)
Basophils Absolute: 0 10*3/uL (ref 0.0–0.1)
Basophils Relative: 0 %
EOS ABS: 0.1 10*3/uL (ref 0.0–0.5)
EOS PCT: 1 %
HCT: 57.1 % — ABNORMAL HIGH (ref 39.0–52.0)
Hemoglobin: 18.9 g/dL — ABNORMAL HIGH (ref 13.0–17.0)
Immature Granulocytes: 0 %
Lymphocytes Relative: 11 %
Lymphs Abs: 1.1 10*3/uL (ref 0.7–4.0)
MCH: 30.2 pg (ref 26.0–34.0)
MCHC: 33.1 g/dL (ref 30.0–36.0)
MCV: 91.4 fL (ref 80.0–100.0)
Monocytes Absolute: 1.2 10*3/uL — ABNORMAL HIGH (ref 0.1–1.0)
Monocytes Relative: 12 %
Neutro Abs: 7.6 10*3/uL (ref 1.7–7.7)
Neutrophils Relative %: 76 %
PLATELETS: 211 10*3/uL (ref 150–400)
RBC: 6.25 MIL/uL — ABNORMAL HIGH (ref 4.22–5.81)
RDW: 12.4 % (ref 11.5–15.5)
WBC: 10.1 10*3/uL (ref 4.0–10.5)
nRBC: 0 % (ref 0.0–0.2)

## 2018-06-30 LAB — COMPREHENSIVE METABOLIC PANEL
ALT: 30 U/L (ref 0–44)
ANION GAP: 14 (ref 5–15)
AST: 36 U/L (ref 15–41)
Albumin: 4.4 g/dL (ref 3.5–5.0)
Alkaline Phosphatase: 99 U/L (ref 38–126)
BUN: 19 mg/dL (ref 8–23)
CO2: 21 mmol/L — ABNORMAL LOW (ref 22–32)
Calcium: 9.7 mg/dL (ref 8.9–10.3)
Chloride: 100 mmol/L (ref 98–111)
Creatinine, Ser: 0.94 mg/dL (ref 0.61–1.24)
GFR calc Af Amer: >60 mL/min (ref >60)
GFR calc non Af Amer: >60 mL/min (ref >60)
Glucose, Bld: 113 mg/dL — ABNORMAL HIGH (ref 70–99)
Potassium: 3.7 mmol/L (ref 3.5–5.1)
Sodium: 135 mmol/L (ref 135–145)
Total Bilirubin: 0.9 mg/dL (ref 0.3–1.2)
Total Protein: 8.9 g/dL — ABNORMAL HIGH (ref 6.5–8.1)

## 2018-06-30 LAB — TROPONIN I
TROPONIN I: 0.04 ng/mL — AB (ref <0.03)
Troponin I: 0.04 ng/mL (ref <0.03)
Troponin I: 0.04 ng/mL (ref <0.03)

## 2018-06-30 LAB — MAGNESIUM: Magnesium: 2.3 mg/dL (ref 1.7–2.4)

## 2018-06-30 LAB — PHOSPHORUS: Phosphorus: 3.1 mg/dL (ref 2.5–4.6)

## 2018-06-30 LAB — ETHANOL: Alcohol, Ethyl (B): <10 mg/dL (ref <10)

## 2018-06-30 MED ORDER — ONDANSETRON HCL 4 MG/2ML IJ SOLN
4.0000 mg | Freq: Four times a day (QID) | INTRAMUSCULAR | Status: DC | PRN
  Administered 2018-06-30 – 2018-07-01 (×2): 4 mg via INTRAVENOUS
  Filled 2018-06-30 (×2): qty 2

## 2018-06-30 MED ORDER — FENTANYL CITRATE (PF) 100 MCG/2ML IJ SOLN
50.0000 ug | Freq: Once | INTRAMUSCULAR | Status: AC
  Administered 2018-06-30: 50 ug via INTRAVENOUS
  Filled 2018-06-30: qty 2

## 2018-06-30 MED ORDER — LOSARTAN POTASSIUM 25 MG PO TABS
25.0000 mg | ORAL_TABLET | Freq: Every day | ORAL | Status: DC
  Administered 2018-07-01: 25 mg via ORAL
  Filled 2018-06-30: qty 1

## 2018-06-30 MED ORDER — ENOXAPARIN SODIUM 40 MG/0.4ML ~~LOC~~ SOLN
40.0000 mg | SUBCUTANEOUS | Status: DC
  Administered 2018-06-30 – 2018-07-01 (×2): 40 mg via SUBCUTANEOUS
  Filled 2018-06-30 (×2): qty 0.4

## 2018-06-30 MED ORDER — PROPRANOLOL HCL 40 MG PO TABS: 40.0000 mg | ORAL_TABLET | Freq: Two times a day (BID) | ORAL | Status: DC

## 2018-06-30 MED ORDER — PROPRANOLOL HCL 40 MG PO TABS
40.0000 mg | ORAL_TABLET | Freq: Three times a day (TID) | ORAL | Status: DC
  Administered 2018-06-30 – 2018-07-02 (×5): 40 mg via ORAL
  Filled 2018-06-30 (×6): qty 1

## 2018-06-30 MED ORDER — NITROGLYCERIN 0.4 MG SL SUBL: 0.4000 mg | SUBLINGUAL_TABLET | SUBLINGUAL | Status: DC | PRN

## 2018-06-30 MED ORDER — ACETAMINOPHEN 325 MG PO TABS
650.0000 mg | ORAL_TABLET | ORAL | Status: DC | PRN
  Administered 2018-06-30 – 2018-07-01 (×2): 650 mg via ORAL
  Filled 2018-06-30 (×2): qty 2

## 2018-06-30 MED ORDER — FUROSEMIDE 40 MG PO TABS
40.0000 mg | ORAL_TABLET | Freq: Every day | ORAL | Status: DC
  Administered 2018-07-01 – 2018-07-02 (×2): 40 mg via ORAL
  Filled 2018-06-30 (×2): qty 1

## 2018-06-30 MED ORDER — HYDRALAZINE HCL 20 MG/ML IJ SOLN
5.0000 mg | Freq: Once | INTRAMUSCULAR | Status: AC
  Administered 2018-06-30: 5 mg via INTRAVENOUS
  Filled 2018-06-30: qty 1

## 2018-06-30 MED ORDER — PANTOPRAZOLE SODIUM 40 MG PO TBEC: 40.0000 mg | DELAYED_RELEASE_TABLET | Freq: Every day | ORAL | Status: DC

## 2018-06-30 MED ORDER — ALUM & MAG HYDROXIDE-SIMETH 200-200-20 MG/5ML PO SUSP
30.0000 mL | Freq: Once | ORAL | Status: AC
  Administered 2018-06-30: 30 mL via ORAL
  Filled 2018-06-30: qty 30

## 2018-06-30 MED ORDER — MORPHINE SULFATE (PF) 2 MG/ML IV SOLN
2.0000 mg | INTRAVENOUS | Status: DC | PRN
  Administered 2018-07-01 – 2018-07-02 (×3): 2 mg via INTRAVENOUS
  Filled 2018-06-30 (×3): qty 1

## 2018-06-30 MED ORDER — ASPIRIN 81 MG PO CHEW
324.0000 mg | CHEWABLE_TABLET | Freq: Once | ORAL | Status: AC
  Administered 2018-06-30: 324 mg via ORAL
  Filled 2018-06-30: qty 4

## 2018-06-30 MED ORDER — NITROGLYCERIN IN D5W 200-5 MCG/ML-% IV SOLN
0.0000 ug/min | INTRAVENOUS | Status: DC
  Administered 2018-06-30: 20 ug/min via INTRAVENOUS
  Administered 2018-06-30: 5 ug/min via INTRAVENOUS
  Filled 2018-06-30: qty 250

## 2018-06-30 MED ORDER — ADULT MULTIVITAMIN W/MINERALS CH
1.0000 | ORAL_TABLET | Freq: Every day | ORAL | Status: DC
  Administered 2018-07-01 – 2018-07-02 (×2): 1 via ORAL
  Filled 2018-06-30 (×2): qty 1

## 2018-06-30 MED ORDER — SENNA 8.6 MG PO TABS: 2.0000 | ORAL_TABLET | Freq: Every day | ORAL | Status: DC

## 2018-06-30 NOTE — ED Notes (Signed)
ED TO INPATIENT HANDOFF REPORT  Name/Age/Gender [name] [name] 68 y.o. male  Code Status Code Status History    Date Active Date Inactive Code Status Order ID Comments User Context   03/05/2014 1641 03/16/2014 1437 Full Code [id number]  Flora Lipps Inpatient   02/12/2014 1557 03/05/2014 1641 Full Code [id number]  Theressa Millard, [place] Inpatient   02/09/2014 1806 02/12/2014 1557 Full Code [id number]  Donnie Mesa, MD Inpatient   07/21/2011 2033 07/29/2011 1630 Full Code [id number]  Robbie Lis, MD ED      Home/SNF/Other Home  Chief Complaint neck,cp/gen weakness   Level of Care/Admitting Diagnosis ED Disposition    ED Disposition Condition [place]: [place] [[id number]]  Level of Care: Progressive [102]  I expect the patient will be discharged within 24 hours: No (not a candidate for 5C-Observation unit)  Diagnosis: Atypical chest pain [297556]  Admitting Physician: [place], [place] [[id number]]  Attending Physician: Raiford Noble LATIF [[id number]]  PT Class (Do Not Modify): Observation [104]  PT Acc Code (Do Not Modify): Observation [10022]       Medical History Past Medical History:  Diagnosis Date  . Alcohol abuse   . Anemia   . Arthritis   . CHF (congestive heart failure)   . Cirrhosis of liver   . Cirrhosis, alcoholic    last etoh 2/24-[facility] dr Henrene Pastor  . Colon polyps   . Esophageal varices   . GERD (gastroesophageal reflux disease)   . H/O sleep apnea   . Hyperlipidemia   . Hypertension   . Sleep apnea    had surgery to correct snoring 2001  . Vocal cord paralysis    left    Allergies Allergies  Allergen Reactions  . Adhesive [Tape] Other (See Comments)    REACTION: blisters.  Use paper tape only  . Amlodipine Other (See Comments)    REACTION:  unknown  . Amoxicillin Hives    Did it involve swelling of the face/tongue/throat, SOB, or low BP? No Did it involve sudden or severe rash/hives, skin  peeling, or any reaction on the inside of your mouth or nose? No Did you need to seek medical attention at a hospital or doctor's office? No When did it last happen? childhood If all above answers are "NO", may proceed with cephalosporin use.   . Atenolol Other (See Comments)    REACTION:  unknown  . Celebrex [Celecoxib] Nausea And Vomiting    Also Vioxx  . Cephalosporins Hives  . Clindamycin/Lincomycin Hives  . Dexon [Dexamethasone Sodium Phosphate] Other (See Comments)    REACTION:  unknown  . Doxazosin Mesylate Other (See Comments)    REACTION: unknown  . Duraprep [employer], Misc.]   . Enalapril Other (See Comments)    REACTION: dry cough  . Erythromycin Hives  . Hctz [Hydrochlorothiazide] Other (See Comments)    REACTION: severe sensitivity to light, burning sensation  . Iodine     AGENT: Duraprep REACTION:  blisters  . Lipitor [Atorvastatin]   . Other     AGENT:Vicryl, cephalnxon REACTION:  "body rejects it"  . Toprol Xl [Metoprolol Succinate]   . Verapamil Other (See Comments)    REACTION:  Dry cough  . Vioxx [Rofecoxib] Hives  . Zocor [Simvastatin] Nausea And Vomiting  . Latex Rash    IV Location/Drains/Wounds Patient Lines/Drains/Airways Status   Active Line/Drains/Airways    Name:   Placement date:   Placement time:   Site:  Days:   Peripheral IV 10/13/12 Right Hand   10/13/12    1343    Hand   2086   Peripheral IV 06/30/18 Right Antecubital   06/30/18    1153    Antecubital   less than 1   External Urinary Catheter   07/24/11    1259    -   2533   Incision 04/21/12 Abdomen Other (Comment)   04/21/12    1212     2261   Nasoenteric Feeding Tube Right nare   03/01/14    0915    Right nare   1582   Pressure Ulcer 07/28/11 Stage I -  Intact skin with non-blanchable redness of a localized area usually over a bony prominence. nonblanchable reddened area to heel, skin intact   07/28/11    0825     2529   Wound / Incision (Open or Dehisced) 02/09/14  Other (Comment) Hand Left Large multiple abrasions/tears on left hand, extending to wrist   02/09/14    2000    Hand   1602          Labs/Imaging Results for orders placed or performed during the hospital encounter of 06/30/18 (from the past 48 hour(s))  Comprehensive metabolic panel     Status: Abnormal   Collection Time: 06/30/18 12:48 PM  Result Value Ref Range   Sodium 135 135 - 145 mmol/L   Potassium 3.7 3.5 - 5.1 mmol/L   Chloride 100 98 - 111 mmol/L   CO2 21 (L) 22 - 32 mmol/L   Glucose, Bld 113 (H) 70 - 99 mg/dL   BUN 19 8 - 23 mg/dL   Creatinine, Ser 0.94 0.61 - 1.24 mg/dL   Calcium 9.7 8.9 - 10.3 mg/dL   Total Protein 8.9 (H) 6.5 - 8.1 g/dL   Albumin 4.4 3.5 - 5.0 g/dL   AST 36 15 - 41 U/L   ALT 30 0 - 44 U/L   Alkaline Phosphatase 99 38 - 126 U/L   Total Bilirubin 0.9 0.3 - 1.2 mg/dL   GFR calc non Af Amer >60 >60 mL/min   GFR calc Af Amer >60 >60 mL/min   Anion gap 14 5 - 15    Comment: Performed at [facility], [address], [place], [place] [address]  Troponin I - Once     Status: Abnormal   Collection Time: 06/30/18 12:48 PM  Result Value Ref Range   Troponin I 0.04 (HH) <0.03 ng/mL    Comment: CRITICAL RESULT CALLED TO, READ BACK BY AND VERIFIED WITH: MIKE Modena Bellemare RN 06/30/2018 AT 1420 BY H SOEWARDIMAN MT Performed at [facility], [place] [address], [place], [place] [address]   CBC with Differential     Status: Abnormal   Collection Time: 06/30/18 12:48 PM  Result Value Ref Range   WBC 10.1 4.0 - 10.5 K/uL   RBC 6.25 (H) 4.22 - 5.81 MIL/uL   Hemoglobin 18.9 (H) 13.0 - 17.0 g/dL   HCT 57.1 (H) 39.0 - 52.0 %   MCV 91.4 80.0 - 100.0 fL   MCH 30.2 26.0 - 34.0 pg   MCHC 33.1 30.0 - 36.0 g/dL   RDW 12.4 11.5 - 15.5 %   Platelets 211 150 - 400 K/uL   nRBC 0.0 0.0 - 0.2 %   Neutrophils Relative % 76 %   Neutro Abs 7.6 1.7 - 7.7 K/uL   Lymphocytes Relative 11 %   Lymphs Abs 1.1 0.7 -  4.0 K/uL   Monocytes Relative 12 %   Monocytes Absolute 1.2  (H) 0.1 - 1.0 K/uL   Eosinophils Relative 1 %   Eosinophils Absolute 0.1 0.0 - 0.5 K/uL   Basophils Relative 0 %   Basophils Absolute 0.0 0.0 - 0.1 K/uL   Immature Granulocytes 0 %   Abs Immature Granulocytes 0.03 0.00 - 0.07 K/uL    Comment: Performed at [facility] [address] [place], [place] [address]   Dg Chest 2 View  Result Date: 06/30/2018 CLINICAL DATA:  epigastric pain, sob, and intermittent left sided posterior neck pain.over 1 year,hx htn,chf,cirrhosis EXAM: CHEST - 2 VIEW COMPARISON:  03/01/2014 and older exams. FINDINGS: Cardiac silhouette is normal in size. No mediastinal or hilar masses. No evidence of adenopathy. Lungs are clear.  No pleural effusion or pneumothorax. Multiple healed left-sided rib fractures. Skeletal structures are demineralized. Mild compression deformities of the lower thoracic and upper lumbar spine, most likely old. IMPRESSION: No acute cardiopulmonary disease. Electronically Signed   By: Lajean Manes M.D.   On: 06/30/2018 13:24    Pending Labs Unresulted Labs (From admission, onward)    Start     Ordered   06/30/18 1559  Urine rapid drug screen (hosp performed)  ONCE - STAT,   STAT     06/30/18 1558   06/30/18 1559  Ethanol  Once,   STAT     06/30/18 1558   Signed and Held  HIV antibody (Routine Testing)  Once,   R     Signed and Held   Signed and Held  Troponin I - Now Then Q3H  Now then every 3 hours,   TIMED     Signed and Held   Signed and Held  Creatinine, serum  (enoxaparin (LOVENOX)    CrCl >/= 30 ml/min)  Weekly,   R    Comments:  while on enoxaparin therapy    Signed and Held   Signed and Held  Hemoglobin A1c  Tomorrow morning,   R     Signed and Held   Signed and Held  TSH  Tomorrow morning,   R     Signed and Held   Signed and Held  Magnesium  Add-on,   R     Signed and Held   Signed and Held  Phosphorus  Add-on,   R     Signed and Held          Vitals/Pain Today's Vitals   06/30/18 1508 06/30/18 1545 06/30/18  1600 06/30/18 1645  BP:  (!) 238/120 (!) 220/112 (!) 234/108  Pulse:    79  Resp:  20 20 (!) 22  Temp:      TempSrc:      SpO2:    94%  PainSc: 1        Isolation Precautions No active isolations  Medications Medications  nitroGLYCERIN (NITROSTAT) SL tablet 0.4 mg (has no administration in time range)  alum & mag hydroxide-simeth (MAALOX/MYLANTA) 200-200-20 MG/5ML suspension 30 mL (30 mLs Oral Given 06/30/18 1458)  aspirin chewable tablet 324 mg (324 mg Oral Given 06/30/18 1459)  fentaNYL (SUBLIMAZE) injection 50 mcg (50 mcg Intravenous Given 06/30/18 1554)  hydrALAZINE (APRESOLINE) injection 5 mg (5 mg Intravenous Given 06/30/18 1629)    Mobility walks

## 2018-06-30 NOTE — ED Provider Notes (Signed)
[facility] EMERGENCY DEPARTMENT Provider Note   CSN: [id number] Arrival date & time: 06/30/18  1113    History   Chief Complaint Chief Complaint  Patient presents with  . Chest Pain    HPI [name] [name] is a 68 y.o. male.     The history is provided by the patient and medical records. No language interpreter was used.  Chest Pain   [name] [name] is a 68 y.o. male who presents to the Emergency Department complaining of chest pain.  Pain is located in the central lower chest/epigastric region.  Sxs started a few months to a year a ago and is described as super bad indigestion.  Pain waxes and wanes with no clear alleviating or worsening sxs.  Last night he was unable to sleep due to pain and he had associated diaphoresis.  Denies fevers, vomiting.  He does have a cough with associated sob.  Occasional nausea.  He has not sought treatment or evaluation of these sxs until today.  He reports recently having a cold (about a week ago, overall improving).  Past Medical History:  Diagnosis Date  . Alcohol abuse   . Anemia   . Arthritis   . CHF (congestive heart failure)   . Cirrhosis of liver   . Cirrhosis, alcoholic    last etoh 2/68-[facility] dr Henrene Pastor  . Colon polyps   . Esophageal varices   . GERD (gastroesophageal reflux disease)   . H/O sleep apnea   . Hyperlipidemia   . Hypertension   . Sleep apnea    had surgery to correct snoring 2001  . Vocal cord paralysis    left    Patient Active Problem List   Diagnosis Date Noted  . Trimalleolar fracture of left ankle 03/08/2014  . Dysphagia, oropharyngeal phase 03/08/2014  . 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 Acute delirium 02/24/2014  . Blunt chest trauma 02/09/2014  . Fracture of lumbar spine ([place]) 02/09/2014  . Multiple rib fractures involving four or more ribs 02/09/2014  . Traumatic pneumothorax 02/09/2014  . Traumatic mesenteric hematoma  02/09/2014  . History of osteopenia 10/13/2012  . Compression fracture of L1 lumbar vertebra ([place]) 10/13/2012  . Chest pain 10/13/2012  . Alcohol abuse 10/13/2012  . Esophageal varices ([place]) 01/17/2012  . History of colonic polyps 10/29/2011  . Cirrhosis ([place]) 10/29/2011  . Nonspecific abnormal finding in stool contents 07/26/2011  . Lower extremity weakness 07/21/2011  . Hyponatremia 07/21/2011  . Anemia 07/21/2011  . Abnormal LFTs 07/21/2011  . Systolic CHF, chronic ([place]) 07/21/2011  . Moderate protein-calorie malnutrition ([place]) 07/21/2011    Past Surgical History:  Procedure Laterality Date  . APPENDECTOMY    . COLONOSCOPY  2013  . HERNIA REPAIR    . INSERTION OF MESH  04/21/2012   Procedure: INSERTION OF MESH;  Surgeon: Harl Bowie, MD;  Location: [facility];  Service: General;  Laterality: N/A;  . MOUTH SURGERY  7/13  . ORIF ANKLE FRACTURE Left 02/12/2014   Procedure: OPEN REDUCTION INTERNAL FIXATION (ORIF) LEFT ANKLE FRACTURE ;  Surgeon: Wylene Simmer, MD;  Location: [facility];  Service: Orthopedics;  Laterality: Left;  . PERCUTANEOUS PINNING Left 02/12/2014   Procedure: Closed Reduction  PERCUTANEOUS PINNING left great toe;  Surgeon: Wylene Simmer, MD;  Location: [facility];  Service: Orthopedics;  Laterality: Left;  . ROTATOR CUFF REPAIR Bilateral   . SHOULDER OPEN  ROTATOR CUFF REPAIR     2 on right shoulder, 1 on left shoulder  . THROAT SURGERY     surgery to help with snoring  . UMBILICAL HERNIA REPAIR  04/21/2012   Procedure: HERNIA REPAIR UMBILICAL ADULT;  Surgeon: Harl Bowie, MD;  Location: [facility];  Service: General;  Laterality: N/A;  umbilical hernia repair with mesh  . UMBILICAL HERNIA REPAIR    . UPPER GI ENDOSCOPY  2013  . UVULOPALATOPHARYNGOPLASTY  2001  . UVULOPALATOPHARYNGOPLASTY          Home Medications    Prior to Admission medications   Medication Sig Start Date End Date Taking? Authorizing Provider    acetaminophen (TYLENOL) 325 MG tablet Take 1-2 tablets (325-650 mg total) by mouth every 6 (six) hours as needed for mild pain. 03/15/14   Love, Ivan Anchors, PA-C  allopurinol (ZYLOPRIM) 300 MG tablet Take 300 mg by mouth every 3 (three) days.     [provider]  buPROPion (WELLBUTRIN XL) 300 MG 24 hr tablet Take 300 mg by mouth at bedtime. 05/21/14   [provider]  docusate (COLACE) 50 MG/5ML liquid Take 10 mLs (100 mg total) by mouth 2 (two) times daily. As stool softner 03/15/14   Love, Ivan Anchors, PA-C  furosemide (LASIX) 40 MG tablet Take 0.5 tablets (20 mg total) by mouth daily. 03/15/14   Love, Ivan Anchors, PA-C  ketoconazole (NIZORAL) 2 % shampoo Apply 1 application topically daily.  04/29/14   [provider]  metoprolol tartrate (LOPRESSOR) 25 MG tablet Take 0.5 tablets (12.5 mg total) by mouth 2 (two) times daily. Patient not taking: Reported on 08/02/2014 03/15/14   Love, Ivan Anchors, PA-C  Multiple Vitamin (MULITIVITAMIN WITH MINERALS) TABS Take 1 tablet by mouth daily. 07/27/11   Verlee Monte, MD  omeprazole (PRILOSEC) 20 MG capsule Take 2 capsules (40 mg total) by mouth daily. 10/14/12   Jerrye Noble, MD  propranolol (INDERAL) 40 MG tablet Take 40 mg by mouth 2 (two) times daily.  05/23/14   [provider]  senna (SENOKOT) 8.6 MG TABS tablet Take 2 tablets (17.2 mg total) by mouth daily. 03/15/14   Love, Ivan Anchors, PA-C  traMADol (ULTRAM) 50 MG tablet Take 50 mg by mouth as needed.  04/09/14   [provider]    Family History Family History  Problem Relation Age of Onset  . Colon cancer Neg Hx   . Stomach cancer Neg Hx   . Rectal cancer Neg Hx   . Heart disease Mother   . Heart disease Father   . Diabetes Mother   . Diabetes Father     Social History Social History   Tobacco Use  . Smoking status: Never Smoker  Substance Use Topics  . Alcohol use: No    Comment: 1/5th day, none since July 21, 2011, hx of 1 gallon a day in the past   . Drug use: No     Allergies   Adhesive [tape]; Amlodipine; Amoxicillin; Atenolol; Celebrex [celecoxib]; Cephalosporins; Clindamycin/lincomycin; Dexon [dexamethasone sodium phosphate]; Doxazosin mesylate; Duraprep [antiseptic products, misc.]; Enalapril; Erythromycin; Hctz [hydrochlorothiazide]; Iodine; Lipitor [atorvastatin]; Other; Toprol xl [metoprolol succinate]; Verapamil; Vioxx [rofecoxib]; Zocor [simvastatin]; and Latex   Review of Systems Review of Systems  Cardiovascular: Positive for chest pain.  All other systems reviewed and are negative.    Physical Exam Updated Vital Signs BP (!) 232/94 (BP Location: Right Arm)   Pulse (!) 55   Temp 97.7 F (36.5  C) (Oral)   Resp 20   SpO2 97%   Physical Exam Vitals signs and nursing note reviewed.  Constitutional:      Appearance: He is well-developed.  HENT:     Head: Normocephalic and atraumatic.  Cardiovascular:     Rate and Rhythm: Normal rate and regular rhythm.     Heart sounds: No murmur.  Pulmonary:     Effort: Pulmonary effort is normal. No respiratory distress.     Comments: Occasional rhonchi Abdominal:     Palpations: Abdomen is soft.     Tenderness: There is no abdominal tenderness. There is no guarding or rebound.  Musculoskeletal:        General: No tenderness.  Skin:    General: Skin is warm and dry.  Neurological:     Mental Status: He is alert and oriented to person, place, and time.  Psychiatric:        Behavior: Behavior normal.      ED Treatments / Results  Labs (all labs ordered are listed, but only abnormal results are displayed) Labs Reviewed - No data to display  EKG EKG Interpretation  Date/Time:  Friday June 30 2018 11:55:30 EST Ventricular Rate:  60 PR Interval:    QRS Duration: 109 QT Interval:  466 QTC Calculation: 466 R Axis:   17 Text Interpretation:  Sinus rhythm q wave in III, borderline ST elevation in V2, V3 No significant change since last tracing Confirmed by  Quintella Reichert [phone]) on 06/30/2018 12:01:34 PM   Radiology No results found.  Procedures Procedures (including critical care time)  Medications Ordered in ED Medications - No data to display   Initial Impression / Assessment and Plan / ED Course  I have reviewed the triage vital signs and the nursing notes.  Pertinent labs & imaging results that were available during my care of the patient were reviewed by me and considered in my medical decision making (see chart for details).        Patient with history of hypertension here for evaluation of one year of chest pain. Pain is located at his xyphoid process.  EKG abnormal but similar when compared to priors.  Pt is markedly hypertensive but in no acute distress - unclear how much of his hypertension is chronic. No evidence of acute pulmonary edema, pneumonia. Initial troponin is mildly elevated. He had no change in his symptoms were blood pressure after nitroglycerin administration. He has multiple medication allergies, which limits antihypertensive options. Hospitalist consulted for further evaluation and treatment for his hypertension as well as atypical chest pain.   Final Clinical Impressions(s) / ED Diagnoses   Final diagnoses:  None    ED Discharge Orders    None       Quintella Reichert, MD 06/30/18 [phone]

## 2018-06-30 NOTE — H&P (Signed)
History and Physical    [name] [name] WYO:[id number] DOB: 12/01/1950 DOA: 06/30/2018  PCP: Leonard Downing, MD   Patient coming from: Home  Chief Complaint: Chest Pain  HPI: [name] [name] is a 68 y.o. male with medical history significant of chronic systolic CHF, history of alcoholic cirrhosis and esophageal varices, history of GERD, history of anemia, prior alcohol abuse who quit 3 years ago, hypertension, hyperlipidemia, history of obstructive sleep apnea, other comorbidities who presents with chief complaint of midsternal chest pain which is progressively gotten worse over the last 3 weeks and worsened to the point today.  Patient states that the chest pain is going on for almost a year but significantly worse in the last 3 weeks and then acutely worsened today and was intolerable.  Patient states that his normal blood pressures run high usually in the systolics of 774J to 287O at home.  Patient states the pain was really bad around 5 AM this morning and he just around the house when it started.  He described as a constant pain and states that it hurts worse when down and it gets a little bit better when he leans forward.  Patient states that it "hurts in the bottom of his sternum" and describes the pain as a 6 out of 10 in severity and describes it as a dull toothache.  He denies any numbness or tingling but then did have also neck pain.  States that whenever he gets chest pain he also developed some nausea or vomiting and today was very diaphoretic.  Denies any headaches.  No burning or discomfort in his urine.  Patient takes furosemide and propanolol for blood pressure medication as well as his cirrhosis.  Patient then later on describes the pain as "acid reflux that stays".  Also described occasionally eating causes the pain to get better then also states that whenever he gets this pain that it goes away on its own after rest.  Patient stopped drinking years ago.  Because of his complaints and  severely uncontrolled hypertension [facility] was asked admit this patient for atypical chest pain rule out ACS along with uncontrolled hypertension/hypertensive urgency.  ED Course: Had basic blood work done and was given 5 mg of IV hydralazine and patient was also given aspirin 324 mg as well as a single dose of sublingual nitroglycerin with no effect.  Chest x-ray was also done which was unrevealing  Review of Systems: As per HPI otherwise 10 point review of systems negative.   Past Medical History:  Diagnosis Date  . Alcohol abuse   . Anemia   . Arthritis   . CHF (congestive heart failure) ([place])   . Cirrhosis of liver ([place])   . Cirrhosis, alcoholic ([place])    last etoh 3/13-sees dr Henrene Pastor  . Colon polyps   . Esophageal varices ([place])   . GERD (gastroesophageal reflux disease)   . H/O sleep apnea   . Hyperlipidemia   . Hypertension   . Sleep apnea    had surgery to correct snoring 2001  . Vocal cord paralysis    left   Past Surgical History:  Procedure Laterality Date  . APPENDECTOMY    . COLONOSCOPY  2013  . HERNIA REPAIR    . INSERTION OF MESH  04/21/2012   Procedure: INSERTION OF MESH;  Surgeon: Harl Bowie, MD;  Location: [facility];  Service: General;  Laterality: N/A;  . MOUTH SURGERY  7/13  . ORIF ANKLE FRACTURE Left  02/12/2014   Procedure: OPEN REDUCTION INTERNAL FIXATION (ORIF) LEFT ANKLE FRACTURE ;  Surgeon: Wylene Simmer, MD;  Location: [facility];  Service: Orthopedics;  Laterality: Left;  . PERCUTANEOUS PINNING Left 02/12/2014   Procedure: Closed Reduction  PERCUTANEOUS PINNING left great toe;  Surgeon: Wylene Simmer, MD;  Location: [facility];  Service: Orthopedics;  Laterality: Left;  . ROTATOR CUFF REPAIR Bilateral   . SHOULDER OPEN ROTATOR CUFF REPAIR     2 on right shoulder, 1 on left shoulder  . THROAT SURGERY     surgery to help with snoring  . UMBILICAL HERNIA REPAIR  04/21/2012   Procedure: HERNIA REPAIR UMBILICAL ADULT;  Surgeon: Harl Bowie,  MD;  Location: [facility];  Service: General;  Laterality: N/A;  umbilical hernia repair with mesh  . UMBILICAL HERNIA REPAIR    . UPPER GI ENDOSCOPY  2013  . UVULOPALATOPHARYNGOPLASTY  2001  . UVULOPALATOPHARYNGOPLASTY     SOCIAL HISTORY  reports that he has never smoked. He does not have any smokeless tobacco history on file. He reports that he does not drink alcohol or use drugs.  Allergies  Allergen Reactions  . Adhesive [Tape] Other (See Comments)    REACTION: blisters.  Use paper tape only  . Amlodipine Other (See Comments)    REACTION:  unknown  . Amoxicillin Hives    Did it involve swelling of the face/tongue/throat, SOB, or low BP? No Did it involve sudden or severe rash/hives, skin peeling, or any reaction on the inside of your mouth or nose? No Did you need to seek medical attention at a hospital or doctor's office? No When did it last happen? childhood If all above answers are "NO", may proceed with cephalosporin use.   . Atenolol Other (See Comments)    REACTION:  unknown  . Celebrex [Celecoxib] Nausea And Vomiting    Also Vioxx  . Cephalosporins Hives  . Clindamycin/Lincomycin Hives  . Dexon [Dexamethasone Sodium Phosphate] Other (See Comments)    REACTION:  unknown  . Doxazosin Mesylate Other (See Comments)    REACTION: unknown  . Duraprep [employer], Misc.]   . Enalapril Other (See Comments)    REACTION: dry cough  . Erythromycin Hives  . Hctz [Hydrochlorothiazide] Other (See Comments)    REACTION: severe sensitivity to light, burning sensation  . Iodine     AGENT: Duraprep REACTION:  blisters  . Lipitor [Atorvastatin]   . Other     AGENT:Vicryl, cephalnxon REACTION:  "body rejects it"  . Toprol Xl [Metoprolol Succinate]   . Verapamil Other (See Comments)    REACTION:  Dry cough  . Vioxx [Rofecoxib] Hives  . Zocor [Simvastatin] Nausea And Vomiting  . Latex Rash   Family History  Problem Relation Age of Onset  .  Diabetes Mother   . Congestive Heart Failure Mother   . Heart disease Father        first MI at age 5, stents, CABG  . Diabetes Father   . Atrial fibrillation Brother   . Stroke Brother   . CAD Brother        MI and stent age 62  . Hypertension Brother   . Colon cancer Neg Hx   . Stomach cancer Neg Hx   . Rectal cancer Neg Hx    Prior to Admission medications   Medication Sig Start Date End Date Taking? Authorizing Provider  acetaminophen (TYLENOL) 325 MG tablet Take 1-2 tablets (325-650 mg total) by mouth every 6 (six)  hours as needed for mild pain. 03/15/14  Yes Love, Ivan Anchors, PA-C  furosemide (LASIX) 40 MG tablet Take 0.5 tablets (20 mg total) by mouth daily. Patient taking differently: Take 40 mg by mouth daily.  03/15/14  Yes Love, Ivan Anchors, PA-C  propranolol (INDERAL) 40 MG tablet Take 40 mg by mouth 2 (two) times daily.  05/23/14  Yes [provider]  docusate (COLACE) 50 MG/5ML liquid Take 10 mLs (100 mg total) by mouth 2 (two) times daily. As stool softner Patient not taking: Reported on 06/30/2018 03/15/14   Bary Leriche, PA-C  metoprolol tartrate (LOPRESSOR) 25 MG tablet Take 0.5 tablets (12.5 mg total) by mouth 2 (two) times daily. Patient not taking: Reported on 08/02/2014 03/15/14   Love, Ivan Anchors, PA-C  Multiple Vitamin (MULITIVITAMIN WITH MINERALS) TABS Take 1 tablet by mouth daily. Patient not taking: Reported on 06/30/2018 07/27/11   Verlee Monte, MD  omeprazole (PRILOSEC) 20 MG capsule Take 2 capsules (40 mg total) by mouth daily. Patient not taking: Reported on 06/30/2018 10/14/12   Jerrye Noble, MD  senna (SENOKOT) 8.6 MG TABS tablet Take 2 tablets (17.2 mg total) by mouth daily. Patient not taking: Reported on 06/30/2018 03/15/14   Flora Lipps   Physical Exam: Vitals:   06/30/18 1600 06/30/18 1645 06/30/18 1730 06/30/18 1745  BP: (!) 220/112 (!) 234/108 (!) 187/98 (!) 208/119  Pulse:  79 70 73  Resp: 20 (!) 22 (!) 23 19  Temp:      TempSrc:       SpO2:  94% 95% 94%   Constitutional: WN/WD Caucasian male in NAD and appears calm but slightly uncomfortable Eyes: Lids and conjunctivae normal, sclerae anicteric  ENMT: External Ears, Nose appear normal. Grossly normal hearing.  Neck: Appears normal, supple, no cervical masses, normal ROM, no appreciable thyromegaly; no JVD Respiratory: Diminished to auscultation bilaterally, no wheezing, rales, rhonchi or crackles. Normal respiratory effort and patient is not tachypenic. No accessory muscle use.  Cardiovascular: RRR, Has a 2/6 murmur. S1 and S2 auscultated. No extremity edema.  Chest Wall: Has pain on palpation in the midsterum Abdomen: Soft, non-tender, non-distended. No masses palpated. No appreciable hepatosplenomegaly. Bowel sounds positive x4.  GU: Deferred. Musculoskeletal: No clubbing / cyanosis of digits/nails. No joint deformity upper and lower extremities. .  Skin: No rashes, lesions, ulcers on a limited skin evaluation. No induration; Warm and dry.  Neurologic: CN 2-12 grossly intact with no focal deficits.  Romberg sign and cerebellar reflexes not assessed.  Psychiatric: Normal judgment and insight. Alert and oriented x 3. Normal mood and appropriate affect.   Labs on Admission: I have personally reviewed following labs and imaging studies  CBC: Recent Labs  Lab 06/30/18 1248  WBC 10.1  NEUTROABS 7.6  HGB 18.9*  HCT 57.1*  MCV 91.4  PLT 426   Basic Metabolic Panel: Recent Labs  Lab 06/30/18 1248  NA 135  K 3.7  CL 100  CO2 21*  GLUCOSE 113*  BUN 19  CREATININE 0.94  CALCIUM 9.7   GFR: CrCl cannot be calculated (Unknown ideal weight.). Liver Function Tests: Recent Labs  Lab 06/30/18 1248  AST 36  ALT 30  ALKPHOS 99  BILITOT 0.9  PROT 8.9*  ALBUMIN 4.4   No results for input(s): LIPASE, AMYLASE in the last 168 hours. No results for input(s): AMMONIA in the last 168 hours. Coagulation Profile: No results for input(s): INR, PROTIME in the last  168 hours. Cardiac Enzymes: Recent  Labs  Lab 06/30/18 1248  TROPONINI 0.04*   BNP (last 3 results) No results for input(s): PROBNP in the last 8760 hours. HbA1C: No results for input(s): HGBA1C in the last 72 hours. CBG: No results for input(s): GLUCAP in the last 168 hours. Lipid Profile: No results for input(s): CHOL, HDL, LDLCALC, TRIG, CHOLHDL, LDLDIRECT in the last 72 hours. Thyroid Function Tests: No results for input(s): TSH, T4TOTAL, FREET4, T3FREE, THYROIDAB in the last 72 hours. Anemia Panel: No results for input(s): VITAMINB12, FOLATE, FERRITIN, TIBC, IRON, RETICCTPCT in the last 72 hours. Urine analysis:    Component Value Date/Time   COLORURINE YELLOW 03/03/2014 [place] 03/03/2014 1257   LABSPEC 1.017 03/03/2014 1257   PHURINE 6.0 03/03/2014 1257   GLUCOSEU NEGATIVE 03/03/2014 1257   HGBUR NEGATIVE 03/03/2014 1257   BILIRUBINUR NEGATIVE 03/03/2014 1257   KETONESUR NEGATIVE 03/03/2014 1257   PROTEINUR NEGATIVE 03/03/2014 1257   UROBILINOGEN 4.0 (H) 03/03/2014 1257   NITRITE NEGATIVE 03/03/2014 1257   LEUKOCYTESUR TRACE (A) 03/03/2014 1257   Sepsis Labs: !!!!!!!!!!!!!!!!!!!!!!!!!!!!!!!!!!!!!!!!!!!! @LABRCNTIP (procalcitonin:4,lacticidven:4) )No results found for this or any previous visit (from the past 240 hour(s)).   Radiological Exams on Admission: Dg Chest 2 View  Result Date: 06/30/2018 CLINICAL DATA:  epigastric pain, sob, and intermittent left sided posterior neck pain.over 1 year,hx htn,chf,cirrhosis EXAM: CHEST - 2 VIEW COMPARISON:  03/01/2014 and older exams. FINDINGS: Cardiac silhouette is normal in size. No mediastinal or hilar masses. No evidence of adenopathy. Lungs are clear.  No pleural effusion or pneumothorax. Multiple healed left-sided rib fractures. Skeletal structures are demineralized. Mild compression deformities of the lower thoracic and upper lumbar spine, most likely old. IMPRESSION: No acute cardiopulmonary disease.  Electronically Signed   By: Lajean Manes M.D.   On: 06/30/2018 13:24   EKG: Independently reviewed. a Sinus Rhythm and q waves in Leads 2 and 3 as well as borderline ST Elevation in V2 and V3 on my interpretation.   Assessment/Plan Active Problems:   Systolic CHF, chronic (HCC)   Cirrhosis (HCC)   Esophageal varices (HCC)   Alcohol abuse   Atypical chest pain   GERD (gastroesophageal reflux disease)   Hypertensive urgency  Atypical Chest Pain r/o ACS in the Setting of Severely Elevated BP/HTN Urgency  -Patient with longstanding CP but states it acutely worsened in the last 3 weeks and was extremely terrible this AM preventing him from going to sleep -Patient describes Pain in his Manubrium/Xyphoid Area but also states radiation into his Left Neck. States that lying flat makes the pain worse and sitting up helps and pain is sometimes gets better with eating. Other times he describes the pain going away positionally  -Place in Obs Telemetry in Progressive Care Unit -Cycle Cardiac Troponins x3; Initial was 0.04 -EKG showed a Sinus Rhythm and q waves in Leads 2 and 3 as well as borderline ST Elevation in V2 and V3 -Patient given 1 dose of SL Nitroglycerin, ASA 324 mg po, Fentanyl 50 mcg -Check TSH, Mag, Phos, Lipid Panel and HbA1c -Check UDS and EtOH Level  -Patient will be started on Nitro gtt given significant elevated BP -Has Risk Factors with Prior Tobacco Abuse Hx, Alcoholic Hx,  -Cardiology consulted for further evaluation and recommendations and will defer to them to stop Nitro gtt and adjust BP Medications   Hypertensive Urgency -Patient was given IV Hydralazine 5 mg x1 in the ED -Restart Home BP Medications with Furosemide and Propanolol -Start the patient on Nitro gtt -Cardiology  consulted for further evaluation and recommendations  Hx of Alcoholic Cirrhosis of the Liver -Patient's LFT's were normal as AST was 36 and ALT was 30 -C/w Propanolol and Lasix home doses -Patient  has quit drinking 3 years ago  GERD/Epigastric Pain/Hx of Esophageal Varices  -C/w PPI and Propanolol  Hx of Chronic Systolic CHF -Currently appears Euvolemic -Strict I's/O's, Daily Weights -Will consider repeating an ECHOCardiogram and obtaining a BNP -Continue to Monitor Volume Status Carefully   Hyperglycemia -Blood Glucose was 113 on Admission  -Check HbA1c -Continue to Monitor Blood Sugars carefully  Erythrocytosis -In the setting of Former Tobacco Abuse -Hb/Hct was 18.9/57.1 -Continue to Monitor and Repeat CBC in AM   DVT prophylaxis: Enoxaparin 40 mg sq q24h Code Status: FULL CODE Family Communication: Discussed with brother at bedside Disposition Plan: Pending improvement of Chest Pain and Clearance by Cardiology  Consults called: Cardiology Admission status: Obs Progressive   Severity of Illness: The appropriate patient status for this patient is OBSERVATION. Observation status is judged to be reasonable and necessary in order to provide the required intensity of service to ensure the patient's safety. The patient's presenting symptoms, physical exam findings, and initial radiographic and laboratory data in the context of their medical condition is felt to place them at decreased risk for further clinical deterioration. Furthermore, it is anticipated that the patient will be medically stable for discharge from the hospital within 2 midnights of admission. The following factors support the patient status of observation.   " The patient's presenting symptoms include Chest Pain, Nausea, Vomiting, and Diaphoresis. " The physical exam findings include a murmur and severely elevated BP. " The initial radiographic and laboratory data are as above.  Kerney Elbe, D.O. [facility]ER is on [place]  If 7PM-7AM, please contact night-coverage [email or web address] [facility]  06/30/2018, 6:34 PM

## 2018-06-30 NOTE — ED Notes (Signed)
Pt denies cp at this time

## 2018-06-30 NOTE — ED Notes (Addendum)
6E CN asked that pt plan be in place before pt is moved to floor. This RN to notify admitting team for plan.

## 2018-06-30 NOTE — ED Triage Notes (Signed)
Pt arrived via pov from home c/o epigastric pain, sob, and  intermittent left sided posterior neck pain. Pt states he has had these issues for more than a year. Pt is alert and oriented and not in distress at this time. Stated pain 5/10 for epigastric.

## 2018-06-30 NOTE — Consult Note (Addendum)
Cardiology Consultation:   Patient ID: [name] [name] MRN: [id number]; DOB: 07/12/1950  Admit date: 06/30/2018 Date of Consult: 06/30/2018  Primary Care Provider: Leonard Downing, MD Primary Cardiologist: No primary care provider on file.  Primary Electrophysiologist:  None    Patient Profile:   [name] [name] is a 68 y.o. male with a hx of sleep apnea status post correction surgery 2001, hypertension, hyperlipidemia, GERD, esophageal varices, alcoholic cirrhosis, CHF, anemia, alcohol abuse and vocal cord paralysis who is being seen today for the evaluation of difficult to control hypertension and chest pain at the request of Dr. Alfredia Ferguson.  History of Present Illness:   [name] [name] presented to the emergency department with chest pain and was noted to have significantly elevated blood pressure.  The hospitalist request assistance with blood pressure control as the patient has multiple drug intolerances.  [name] [name] tells me that he presented to the hospital with complaints of pain in his chest that is localized to the most inferior aspect of his sternum that has been a constant aching " steady pain" for several weeks off and on.  He also complains of an aching pain in the left side of his neck slightly posterior that he feels like is a nerve.  It feels better when I rub it.  He is not having any substernal chest discomfort.  He says that he is not very active at home but he does go walking in the neighborhood with no exertional chest discomfort or shortness of breath.  He denies any orthopnea, PND or edema.  He has been sleeping in a chair due to the pain in his lower sternum.  He also feels like he has some discomfort in the epigastric area and he wonders if he has an ulcer.  The patient notes that he has always had high blood pressure which is never been very well controlled.  He does see his PCP.  He is unable to recall what medicines he is tried in the past or what reactions he has had 2  medications.  He says that he did have breaking out in hives at one point with the medication but he does not know which one.  The patient is a prior smoking having quit about 10 years ago.  He says that he was a longtime drinker but has not had any alcohol in about 3 years.  He does have significant family history of CAD with his mother having CHF and hypertension.  His father had several MIs with the first 1 at age 50 and then he had bypass at some point.  He died at age 46.  He has a brother who has atrial fibrillation and an associated stroke.  His other brother had an MI at age 39 and hypertension.  Past Medical History:  Diagnosis Date  . Alcohol abuse   . Anemia   . Arthritis   . CHF (congestive heart failure) ([place])   . Cirrhosis of liver ([place])   . Cirrhosis, alcoholic ([place])    last etoh 3/13-sees dr Henrene Pastor  . Colon polyps   . Esophageal varices ([place])   . GERD (gastroesophageal reflux disease)   . H/O sleep apnea   . Hyperlipidemia   . Hypertension   . Sleep apnea    had surgery to correct snoring 2001  . Vocal cord paralysis    left    Past Surgical History:  Procedure Laterality Date  . APPENDECTOMY    . COLONOSCOPY  2013  .  HERNIA REPAIR    . INSERTION OF MESH  04/21/2012   Procedure: INSERTION OF MESH;  Surgeon: Harl Bowie, MD;  Location: [place];  Service: General;  Laterality: N/A;  . MOUTH SURGERY  7/13  . ORIF ANKLE FRACTURE Left 02/12/2014   Procedure: OPEN REDUCTION INTERNAL FIXATION (ORIF) LEFT ANKLE FRACTURE ;  Surgeon: Wylene Simmer, MD;  Location: [place];  Service: Orthopedics;  Laterality: Left;  . PERCUTANEOUS PINNING Left 02/12/2014   Procedure: Closed Reduction  PERCUTANEOUS PINNING left great toe;  Surgeon: Wylene Simmer, MD;  Location: [place];  Service: Orthopedics;  Laterality: Left;  . ROTATOR CUFF REPAIR Bilateral   . SHOULDER OPEN ROTATOR CUFF REPAIR     2 on right shoulder, 1 on left shoulder  . THROAT SURGERY     surgery to  help with snoring  . UMBILICAL HERNIA REPAIR  04/21/2012   Procedure: HERNIA REPAIR UMBILICAL ADULT;  Surgeon: Harl Bowie, MD;  Location: [place];  Service: General;  Laterality: N/A;  umbilical hernia repair with mesh  . UMBILICAL HERNIA REPAIR    . UPPER GI ENDOSCOPY  2013  . UVULOPALATOPHARYNGOPLASTY  2001  . UVULOPALATOPHARYNGOPLASTY       Home Medications:  Prior to Admission medications   Medication Sig Start Date End Date Taking? Authorizing Provider  acetaminophen (TYLENOL) 325 MG tablet Take 1-2 tablets (325-650 mg total) by mouth every 6 (six) hours as needed for mild pain. 03/15/14  Yes Love, Ivan Anchors, PA-C  furosemide (LASIX) 40 MG tablet Take 0.5 tablets (20 mg total) by mouth daily. Patient taking differently: Take 40 mg by mouth daily.  03/15/14  Yes Love, Ivan Anchors, PA-C  propranolol (INDERAL) 40 MG tablet Take 40 mg by mouth 2 (two) times daily.  05/23/14  Yes [provider]  docusate (COLACE) 50 MG/5ML liquid Take 10 mLs (100 mg total) by mouth 2 (two) times daily. As stool softner Patient not taking: Reported on 06/30/2018 03/15/14   Bary Leriche, PA-C  metoprolol tartrate (LOPRESSOR) 25 MG tablet Take 0.5 tablets (12.5 mg total) by mouth 2 (two) times daily. Patient not taking: Reported on 08/02/2014 03/15/14   Love, Ivan Anchors, PA-C  Multiple Vitamin (MULITIVITAMIN WITH MINERALS) TABS Take 1 tablet by mouth daily. Patient not taking: Reported on 06/30/2018 07/27/11   Verlee Monte, MD  omeprazole (PRILOSEC) 20 MG capsule Take 2 capsules (40 mg total) by mouth daily. Patient not taking: Reported on 06/30/2018 10/14/12   Jerrye Noble, MD  senna (SENOKOT) 8.6 MG TABS tablet Take 2 tablets (17.2 mg total) by mouth daily. Patient not taking: Reported on 06/30/2018 03/15/14   Bary Leriche, PA-C    Inpatient Medications: Scheduled Meds:  Continuous Infusions:  PRN Meds: nitroGLYCERIN  Allergies:    Allergies  Allergen Reactions  .  Adhesive [Tape] Other (See Comments)    REACTION: blisters.  Use paper tape only  . Amlodipine Other (See Comments)    REACTION:  unknown  . Amoxicillin Hives    Did it involve swelling of the face/tongue/throat, SOB, or low BP? No Did it involve sudden or severe rash/hives, skin peeling, or any reaction on the inside of your mouth or nose? No Did you need to seek medical attention at a hospital or doctor's office? No When did it last happen? childhood If all above answers are "NO", may proceed with cephalosporin use.   . Atenolol Other (See Comments)    REACTION:  unknown  .  Celebrex [Celecoxib] Nausea And Vomiting    Also Vioxx  . Cephalosporins Hives  . Clindamycin/Lincomycin Hives  . Dexon [Dexamethasone Sodium Phosphate] Other (See Comments)    REACTION:  unknown  . Doxazosin Mesylate Other (See Comments)    REACTION: unknown  . Duraprep [employer], Misc.]   . Enalapril Other (See Comments)    REACTION: dry cough  . Erythromycin Hives  . Hctz [Hydrochlorothiazide] Other (See Comments)    REACTION: severe sensitivity to light, burning sensation  . Iodine     AGENT: Duraprep REACTION:  blisters  . Lipitor [Atorvastatin]   . Other     AGENT:Vicryl, cephalnxon REACTION:  "body rejects it"  . Toprol Xl [Metoprolol Succinate]   . Verapamil Other (See Comments)    REACTION:  Dry cough  . Vioxx [Rofecoxib] Hives  . Zocor [Simvastatin] Nausea And Vomiting  . Latex Rash    Social History:   Social History   Socioeconomic History  . Marital status: Married    Spouse name: Not on file  . Number of children: Not on file  . Years of education: Not on file  . Highest education level: Not on file  Occupational History  . Not on file  Social Needs  . Financial resource strain: Not on file  . Food insecurity:    Worry: Not on file    Inability: Not on file  . Transportation needs:    Medical: Not on file    Non-medical: Not on file  Tobacco Use  .  Smoking status: Never Smoker  Substance and Sexual Activity  . Alcohol use: No    Comment: 1/5th day, none since July 21, 2011, hx of 1 gallon a day in the past  . Drug use: No  . Sexual activity: Not on file  Lifestyle  . Physical activity:    Days per week: Not on file    Minutes per session: Not on file  . Stress: Not on file  Relationships  . Social connections:    Talks on phone: Not on file    Gets together: Not on file    Attends religious service: Not on file    Active member of club or organization: Not on file    Attends meetings of clubs or organizations: Not on file    Relationship status: Not on file  . Intimate partner violence:    Fear of current or ex partner: Not on file    Emotionally abused: Not on file    Physically abused: Not on file    Forced sexual activity: Not on file  Other Topics Concern  . Not on file  Social History Narrative   ** Merged History Encounter **        Family History:    Family History  Problem Relation Age of Onset  . Diabetes Mother   . Congestive Heart Failure Mother   . Heart disease Father        first MI at age 22, stents, CABG  . Diabetes Father   . Atrial fibrillation Brother   . Stroke Brother   . CAD Brother        MI and stent age 62  . Hypertension Brother   . Colon cancer Neg Hx   . Stomach cancer Neg Hx   . Rectal cancer Neg Hx      ROS:  Please see the history of present illness.   All other ROS reviewed and negative.     Physical  Exam/Data:   Vitals:   06/30/18 1600 06/30/18 1645 06/30/18 1730 06/30/18 1745  BP: (!) 220/112 (!) 234/108 (!) 187/98 (!) 208/119  Pulse:  79 70 73  Resp: 20 (!) 22 (!) 23 19  Temp:      TempSrc:      SpO2:  94% 95% 94%   No intake or output data in the 24 hours ending 06/30/18 1802 Last 3 Weights 08/02/2014 07/22/2014 07/10/2014  Weight (lbs) 200 lb 200 lb 200 lb 2 oz  Weight (kg) 90.719 kg 90.719 kg 90.776 kg     There is no height or weight on file to calculate  BMI.  General:  Well nourished, well developed, in no acute distress HEENT: normal Lymph: no adenopathy Neck: no JVD Endocrine:  No thryomegaly Vascular: No carotid bruits; FA pulses 2+ bilaterally without bruits  Cardiac:  normal S1, S2; RRR; Faint high pitched systolic apical muurmur Lungs:  clear to auscultation bilaterally, no wheezing, rhonchi or rales  Abd: soft, nontender, no hepatomegaly  Ext: no edema Musculoskeletal:  No deformities, BUE and BLE strength normal and equal Skin: warm and dry  Neuro:  CNs 2-12 intact, no focal abnormalities noted Psych:  Normal affect   EKG:  The EKG was personally reviewed and demonstrates:  Sinus rhythm, q wave in III, borderline ST elevation in V2, V3 No significant change since last tracing. Poss early repolarization Telemetry:  Telemetry was personally reviewed and demonstrates:  Sinus rhythm with occasional PACs, rates in the 60s  Relevant CV Studies:  Echo 07/23/2011: EF 55-60%, calcified mitral annulus, mildly calcified aortic leaflets  Laboratory Data:  Chemistry Recent Labs  Lab 06/30/18 1248  NA 135  K 3.7  CL 100  CO2 21*  GLUCOSE 113*  BUN 19  CREATININE 0.94  CALCIUM 9.7  GFRNONAA >60  GFRAA >60  ANIONGAP 14    Recent Labs  Lab 06/30/18 1248  PROT 8.9*  ALBUMIN 4.4  AST 36  ALT 30  ALKPHOS 99  BILITOT 0.9   Hematology Recent Labs  Lab 06/30/18 1248  WBC 10.1  RBC 6.25*  HGB 18.9*  HCT 57.1*  MCV 91.4  MCH 30.2  MCHC 33.1  RDW 12.4  PLT 211   Cardiac Enzymes Recent Labs  Lab 06/30/18 1248  TROPONINI 0.04*   No results for input(s): TROPIPOC in the last 168 hours.  BNPNo results for input(s): BNP, PROBNP in the last 168 hours.  DDimer No results for input(s): DDIMER in the last 168 hours.  Radiology/Studies:  Dg Chest 2 View  Result Date: 06/30/2018 CLINICAL DATA:  epigastric pain, sob, and intermittent left sided posterior neck pain.over 1 year,hx htn,chf,cirrhosis EXAM: CHEST - 2 VIEW  COMPARISON:  03/01/2014 and older exams. FINDINGS: Cardiac silhouette is normal in size. No mediastinal or hilar masses. No evidence of adenopathy. Lungs are clear.  No pleural effusion or pneumothorax. Multiple healed left-sided rib fractures. Skeletal structures are demineralized. Mild compression deformities of the lower thoracic and upper lumbar spine, most likely old. IMPRESSION: No acute cardiopulmonary disease. Electronically Signed   By: Lajean Manes M.D.   On: 06/30/2018 13:24    Assessment and Plan:   Chest pain -The patient's pain is very atypical as it is a localized aching at the bottom of his sternum which is constant and has been present for several weeks. -First troponin is very mildly elevated at 0.04, which could be related to demand ischemia in the setting of hypertension.  Continue to trend troponins.  Very unlikely ACS.  -Chest x-ray is negative. -CVD risk factors include family history, remote smoker, uncontrolled hypertension (no recent lipid panel in epic) -Will check lexiscan myoview tomorrow unless troponins elevate.  Hypertension -Patient has had longstanding hypertension, difficult to control due to multiple drug reactions.  He is unable to tell me the medications that he is tried or what reactions he has had. -Home management includes Lasix 20 mg daily, propranolol 40 mg twice daily -Renal function is normal. Per allergy list Pt appears to have had intolerances to amlodipine and verapamil with dry cough. HCTZ with sensitivity to light, burning sensation. Atenolol and Toprol with undocumented reaction. We could try an ARB and/or aldactone. Or hydralazine.  -We will discuss further management with Dr. Sallyanne Kuster  Murmur -Faint high-pitched systolic apical murmur noted.  Check echocardiogram for valve function and EF considering longstanding uncontrolled hypertension.  For questions or updates, please contact [place] Please consult [email or web address] for contact info  under     Signed, Daune Perch, NP  06/30/2018 6:02 PM    I have seen and examined the patient along with Daune Perch, NP.  I have reviewed the chart, notes and new data.  I agree with NP's note.  Key new complaints: low retrosternal discomfort could represent angina, but is not typical. History of multiple med intolerances. Recalls side effect from zocor, but did tolerate lipitor. Key examination changes: severely elevated BP 210/125; L>R carotid bruit (unclear if radiating from chest), mid-peaking systolic murmur heard both at the aortic focus and the apex, faint abdominal bruits, no palpable aneurysm Key new findings / data:  - Abdominal MRA 2004 negative for renal artery stenosis - CT chest 2015 "Moderate aortic valvular calcification. Moderate LAD and circumflex coronary atherosclerosis. Thoracic aortic atherosclerosis without aneurysm or dissection." - Adenosine Cardiolite 2004: "1.  NO EVIDENCE OF PHARMACOLOGICALLY INDUCED MYOCARDIAL ISCHEMIA. 2.  MILD TO MODERATE GLOBAL HYPOKINESIS WITH CALCULATED LEFT VENTRICULAR EJECTION FRACTION OF 35 PERCENT." - Echo 2013: - Left ventricle: The cavity size was normal. Systolic function was normal. The estimated ejection fraction was in the range of 55% to 60%. Wall motion was normal; therewere no regional wall motion abnormalities.  - Aortic valve: Mildly calcified leaflets. There was no stenosis." - Low risk ECG, inferior Q waves are sharp - Marginal elevation in troponin not indicative of acute coronary sd., but could well be explained by severe HTN  PLAN: - Reevaluate for renal artery stenosis with Duplex US. He will also need carotid duplex US at some point for the bruits. 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 Spironolactone a good alternative. - Being started on  IV NTG. Bidil type hydralazine-nitrate combo is another option for his BP. - Echo for possible aortic stenosis and chest pain - Lexiscan Myoview - he cannot exercise with this BP and we cannot safely stop his beta blocker for a treadmill test. - Recheck lipids and resume atorvastatin as appropriate, target LDL<100.  Sanda Klein, MD, [place] [phone] 06/30/2018, 7:00 PM

## 2018-07-01 ENCOUNTER — Observation Stay (HOSPITAL_BASED_OUTPATIENT_CLINIC_OR_DEPARTMENT_OTHER): Payer: Medicare Other

## 2018-07-01 DIAGNOSIS — R0789 Other chest pain: Secondary | ICD-10-CM | POA: Diagnosis not present

## 2018-07-01 DIAGNOSIS — I110 Hypertensive heart disease with heart failure: Secondary | ICD-10-CM | POA: Diagnosis not present

## 2018-07-01 DIAGNOSIS — K7460 Unspecified cirrhosis of liver: Secondary | ICD-10-CM | POA: Diagnosis not present

## 2018-07-01 DIAGNOSIS — R079 Chest pain, unspecified: Secondary | ICD-10-CM

## 2018-07-01 DIAGNOSIS — I5022 Chronic systolic (congestive) heart failure: Secondary | ICD-10-CM | POA: Diagnosis not present

## 2018-07-01 LAB — BASIC METABOLIC PANEL
ANION GAP: 13 (ref 5–15)
BUN: 37 mg/dL — ABNORMAL HIGH (ref 8–23)
CO2: 22 mmol/L (ref 22–32)
Calcium: 8.8 mg/dL — ABNORMAL LOW (ref 8.9–10.3)
Chloride: 96 mmol/L — ABNORMAL LOW (ref 98–111)
Creatinine, Ser: 0.94 mg/dL (ref 0.61–1.24)
GFR calc Af Amer: >60 mL/min (ref >60)
GFR calc non Af Amer: >60 mL/min (ref >60)
Glucose, Bld: 141 mg/dL — ABNORMAL HIGH (ref 70–99)
Potassium: 2.9 mmol/L — ABNORMAL LOW (ref 3.5–5.1)
Sodium: 131 mmol/L — ABNORMAL LOW (ref 135–145)

## 2018-07-01 LAB — ECHOCARDIOGRAM COMPLETE
Height: 67 in
Weight: 2790.4 oz

## 2018-07-01 LAB — CBC
HEMATOCRIT: 51.8 % (ref 39.0–52.0)
HEMOGLOBIN: 18.3 g/dL — AB (ref 13.0–17.0)
MCH: 31.1 pg (ref 26.0–34.0)
MCHC: 35.3 g/dL (ref 30.0–36.0)
MCV: 88.1 fL (ref 80.0–100.0)
Platelets: 192 10*3/uL (ref 150–400)
RBC: 5.88 MIL/uL — ABNORMAL HIGH (ref 4.22–5.81)
RDW: 12.7 % (ref 11.5–15.5)
WBC: 12.5 10*3/uL — ABNORMAL HIGH (ref 4.0–10.5)
nRBC: 0 % (ref 0.0–0.2)

## 2018-07-01 LAB — NM MYOCAR MULTI W/SPECT W/WALL MOTION / EF
CSEPPHR: 107 {beats}/min
Estimated workload: 1 METS
Exercise duration (min): 5 min
Exercise duration (sec): 1 s
Rest HR: 88 {beats}/min

## 2018-07-01 LAB — HEMOGLOBIN A1C
HEMOGLOBIN A1C: 6.1 % — AB (ref 4.8–5.6)
Mean Plasma Glucose: 128.37 mg/dL

## 2018-07-01 LAB — TSH: TSH: 0.285 u[IU]/mL — ABNORMAL LOW (ref 0.350–4.500)

## 2018-07-01 LAB — T4, FREE: Free T4: 1.16 ng/dL (ref 0.82–1.77)

## 2018-07-01 LAB — HIV ANTIBODY (ROUTINE TESTING W REFLEX): HIV Screen 4th Generation wRfx: NONREACTIVE

## 2018-07-01 LAB — TROPONIN I: Troponin I: 0.04 ng/mL (ref <0.03)

## 2018-07-01 MED ORDER — HYDRALAZINE HCL 20 MG/ML IJ SOLN
10.0000 mg | INTRAMUSCULAR | Status: DC | PRN
  Administered 2018-07-01: 10 mg via INTRAVENOUS
  Filled 2018-07-01: qty 1

## 2018-07-01 MED ORDER — POTASSIUM CHLORIDE 10 MEQ/100ML IV SOLN
10.0000 meq | INTRAVENOUS | Status: AC
  Administered 2018-07-01 (×2): 10 meq via INTRAVENOUS
  Filled 2018-07-01 (×2): qty 100

## 2018-07-01 MED ORDER — TECHNETIUM TC 99M TETROFOSMIN IV KIT
30.0000 | PACK | Freq: Once | INTRAVENOUS | Status: AC | PRN
  Administered 2018-07-01: 30 via INTRAVENOUS

## 2018-07-01 MED ORDER — REGADENOSON 0.4 MG/5ML IV SOLN
0.4000 mg | Freq: Once | INTRAVENOUS | Status: AC
  Administered 2018-07-01: 0.4 mg via INTRAVENOUS
  Filled 2018-07-01: qty 5

## 2018-07-01 MED ORDER — TECHNETIUM TC 99M TETROFOSMIN IV KIT
10.0000 | PACK | Freq: Once | INTRAVENOUS | Status: AC | PRN
  Administered 2018-07-01: 10 via INTRAVENOUS

## 2018-07-01 MED ORDER — POTASSIUM CHLORIDE CRYS ER 20 MEQ PO TBCR
40.0000 meq | EXTENDED_RELEASE_TABLET | ORAL | Status: AC
  Administered 2018-07-01 (×2): 40 meq via ORAL
  Filled 2018-07-01 (×2): qty 2

## 2018-07-01 MED ORDER — LOSARTAN POTASSIUM 50 MG PO TABS
50.0000 mg | ORAL_TABLET | Freq: Every day | ORAL | Status: DC
  Administered 2018-07-02: 50 mg via ORAL
  Filled 2018-07-01: qty 1

## 2018-07-01 MED ORDER — HYDRALAZINE HCL 20 MG/ML IJ SOLN
20.0000 mg | INTRAMUSCULAR | Status: DC | PRN
  Administered 2018-07-01: 20 mg via INTRAVENOUS
  Filled 2018-07-01: qty 1

## 2018-07-01 MED ORDER — REGADENOSON 0.4 MG/5ML IV SOLN
INTRAVENOUS | Status: AC
  Administered 2018-07-01: 0.4 mg
  Filled 2018-07-01: qty 5

## 2018-07-01 MED ORDER — SPIRONOLACTONE 12.5 MG HALF TABLET
12.5000 mg | ORAL_TABLET | Freq: Every day | ORAL | Status: DC
  Administered 2018-07-01 – 2018-07-02 (×2): 12.5 mg via ORAL
  Filled 2018-07-01 (×2): qty 1

## 2018-07-01 MED ORDER — LOSARTAN POTASSIUM 25 MG PO TABS
25.0000 mg | ORAL_TABLET | Freq: Once | ORAL | Status: AC
  Administered 2018-07-01: 25 mg via ORAL
  Filled 2018-07-01: qty 1

## 2018-07-01 NOTE — Progress Notes (Signed)
Progress Note  Patient Name: [name] [name] Date of Encounter: 07/01/2018  Primary Cardiologist: New Dr Croitoru  Subjective   No complaints  Inpatient Medications    Scheduled Meds: . enoxaparin (LOVENOX) injection  40 mg Subcutaneous Q24H  . furosemide  40 mg Oral Daily  . losartan  25 mg Oral Daily  . multivitamin with minerals  1 tablet Oral Daily  . propranolol  40 mg Oral TID  . regadenoson  0.4 mg Intravenous Once   Continuous Infusions: . nitroGLYCERIN Stopped (07/01/18 0747)   PRN Meds: acetaminophen, hydrALAZINE, morphine injection, nitroGLYCERIN, ondansetron (ZOFRAN) IV   Vital Signs    Vitals:   07/01/18 0745 07/01/18 0909 07/01/18 0922 07/01/18 0924  BP: (!) 191/102 (!) 188/87 (!) 152/70 (!) 170/80  Pulse:      Resp: (!) 22     Temp:      TempSrc:      SpO2:      Weight:      Height:        Intake/Output Summary (Last 24 hours) at 07/01/2018 1032 Last data filed at 06/30/2018 2359 Gross per 24 hour  Intake 21.26 ml  Output -  Net 21.26 ml   Last 3 Weights 07/01/2018 06/30/2018 08/02/2014  Weight (lbs) 174 lb 6.4 oz 177 lb 200 lb  Weight (kg) 79.107 kg 80.287 kg 90.719 kg      Telemetry    SR - Personally Reviewed  ECG    na  Physical Exam   GEN: No acute distress.   Neck: No JVD Cardiac: RRR, 3/6 systolic murmuru rusb Respiratory: Clear to auscultation bilaterally. GI: Soft, nontender, non-distended  MS: No edema; No deformity. Neuro:  Nonfocal  Psych: Normal affect   Labs    Chemistry Recent Labs  Lab 06/30/18 1248  NA 135  K 3.7  CL 100  CO2 21*  GLUCOSE 113*  BUN 19  CREATININE 0.94  CALCIUM 9.7  PROT 8.9*  ALBUMIN 4.4  AST 36  ALT 30  ALKPHOS 99  BILITOT 0.9  GFRNONAA >60  GFRAA >60  ANIONGAP 14     Hematology Recent Labs  Lab 06/30/18 1248  WBC 10.1  RBC 6.25*  HGB 18.9*  HCT 57.1*  MCV 91.4  MCH 30.2  MCHC 33.1  RDW 12.4  PLT 211    Cardiac Enzymes Recent Labs  Lab 06/30/18 1248  06/30/18 1912 06/30/18 2214 07/01/18 0100  TROPONINI 0.04* 0.04* 0.04* 0.04*   No results for input(s): TROPIPOC in the last 168 hours.   BNPNo results for input(s): BNP, PROBNP in the last 168 hours.   DDimer No results for input(s): DDIMER in the last 168 hours.   Radiology    Dg Chest 2 View  Result Date: 06/30/2018 CLINICAL DATA:  epigastric pain, sob, and intermittent left sided posterior neck pain.over 1 year,hx htn,chf,cirrhosis EXAM: CHEST - 2 VIEW COMPARISON:  03/01/2014 and older exams. FINDINGS: Cardiac silhouette is normal in size. No mediastinal or hilar masses. No evidence of adenopathy. Lungs are clear.  No pleural effusion or pneumothorax. Multiple healed left-sided rib fractures. Skeletal structures are demineralized. Mild compression deformities of the lower thoracic and upper lumbar spine, most likely old. IMPRESSION: No acute cardiopulmonary disease. Electronically Signed   By: Lajean Manes M.D.   On: 06/30/2018 13:24    Cardiac Studies     Patient Profile     [name] [name] is a 68 y.o. male with a hx of sleep apnea status post  correction surgery 2001, hypertension, hyperlipidemia, GERD, esophageal varices, alcoholic cirrhosis, CHF, anemia, alcohol abuse and vocal cord paralysis who is being seen today for the evaluation of difficult to control hypertension and chest pain at the request of Dr. Alfredia Ferguson.  Assessment & Plan    1. 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 - nuclear stress test pending today. Echo pending today   2. HTN - multiple adverse reactions to meds - currently on propanolol 40mg  tid (increased), started on losartan 25mg  daily. Room to titrate losartan, could aldactone. Does not appear he got losartan yesterday, first dose for  today. Has been getting prn hydralazine IV. Transiently on IV NG gtt now off.  - repeat vascular renal artery [place] pending   3. Heart murmur - awaiting echo results - will need outpatient carotid US for murmurs   For questions or updates, please contact [place] Please consult [email or web address] for contact info under        Signed, Carlyle Dolly, MD  07/01/2018, 10:32 AM

## 2018-07-01 NOTE — Progress Notes (Signed)
  Echocardiogram 2D Echocardiogram has been performed.  [name] [name] 07/01/2018, 12:50 PM

## 2018-07-01 NOTE — Progress Notes (Addendum)
**Note De-Identified vi Obfusction** PROGRESS NOTE    [name] [name]  LKG:[id number] DOB: 10/17/1950 DOA: 06/30/2018 PCP: Leonrd Downing, MD  Brief Nrrtive:  Per HPI [name] [name] is  68 y.o. mle with medicl history significnt of chronic systolic CHF, history of lcoholic cirrhosis nd esophgel vrices, history of GERD, history of nemi, prior lcohol buse who quit 3 yers go, hypertension, hyperlipidemi, history of obstructive sleep pne, other comorbidities who presents with chief complint of midsternl chest pin which is progressively gotten worse over the lst 3 weeks nd worsened to the point tody.  Ptient sttes tht the chest pin is going on for lmost  yer but significntly worse in the lst 3 weeks nd then cutely worsened tody nd ws intolerble.  Ptient sttes tht his norml blood pressures run high usully in the systolics of 664Q to 034V t home.  Ptient sttes the pin ws relly bd round 5 AM this morning nd he just round the house when it strted.  He described s  constnt pin nd sttes tht it hurts worse when down nd it gets  little bit better when he lens forwrd.  Ptient sttes tht it "hurts in the bottom of his sternum" nd describes the pin s  6 out of 10 in severity nd describes it s  dull toothche.  He denies ny numbness or tingling but then did hve lso neck pin.  Sttes tht whenever he gets chest pin he lso developed some nuse or vomiting nd tody ws very diphoretic.  Denies ny hedches.  No burning or discomfort in his urine.  Ptient tkes furosemide nd propnolol for blood pressure mediction s well s his cirrhosis.  Ptient then lter on describes the pin s "cid reflux tht stys".  Also described occsionlly eting cuses the pin to get better then lso sttes tht whenever he gets this pin tht it goes wy on its own fter rest.  Ptient stopped drinking yers go.  Becuse of his complints nd severely uncontrolled hypertension TRH ws  sked dmit this ptient for typicl chest pin rule out ACS long with uncontrolled hypertension/hypertensive urgency.  Assessment & Pln:   Active Problems:   Systolic CHF, chronic (HCC)   Cirrhosis (HCC)   Esophgel vrices (HCC)   Alcohol buse   Atypicl chest pin   GERD (gstroesophgel reflux disese)   Hypertensive urgency  Atypicl Chest Pin r/o ACS in the Setting of Severely Elevted BP/HTN Urgency  -Ptient with longstnding CP but sttes it cutely worsened in the lst 3 weeks nd ws terrible on dy of dmission preventing sleep -Ptient describes Pin in his Mnubrium/Xyphoid Are but lso sttes rdition into his Left Neck. Sttes tht lying flt mkes the pin worse nd sitting up helps nd pin is sometimes gets better with eting. Other times he describes the pin going wy positionlly  - Troponins minimlly elevted nd flt - EKG showed  Sinus Rhythm nd q wves in Leds II nd III s well s borderline ST Elevtion in V2 nd V3.  Repet EKG 2/29 AM ppers similr (?q in II, q in III nd VF). -Ptient given 1 dose of SL Nitroglycerin, ASA 324 mg po, Fentnyl 50 mcg - Lipid Pnel (pending) nd HbA1c (predibetes) - crdiology c/s, pprecite recs - echo with norml EF, no WMA reported.  Low risk stress test.  Discussed with crds who recommended continued monitoring with uptitrtion of BP meds, possible d/c tomorrow, 3/1.   Hypertensive Urgency -Ptient ws given IV Hydrlzine 5 mg x1  in the ED - Increased propanolol to 40 mg TID, start losartan 25 mg daily. - will increase losartan to 50 mg daily given persistently poorly controlled BP - could consider adding spiro - renal artery [place] pending (unable to do this until Monday, may need to happen outpatient) - Continues to have HTN with requiring intravenous BP meds -Cardiology consulted for further evaluation and recommendations  Hx of Alcoholic Cirrhosis of the Liver -C/w Propanolol and Lasix home  doses -Patient has quit drinking 3 years ago  GERD/Epigastric Pain/Hx of Esophageal Varices  -C/w PPI and Propanolol  Hx of Chronic Systolic CHF -normal EF on echo today - follow volume status with diastolic dysfunction  Prediabetes -Follow outpatient   Polycythemia -In the setting of Former Tobacco Abuse -Follow iron studies  Abnormal Thyroid Function Tests: repeat outpatient (subclinical hyperthyroidism)  Hypokalemia: replace and follow, follow mag  DVT prophylaxis: lovenox Code Status: full  Family Communication: none at bedside Disposition Plan: pending improvement and clearance from cardiology, recommending continued monitoring, BP med adjustment at this time.    Consultants:   cardiology  Procedures:  IMPRESSIONS    1. The left ventricle has normal systolic function with an ejection fraction of 60-65%. The cavity size was normal. Left ventricular diastolic Doppler parameters are consistent with impaired relaxation.  2. The right ventricle has normal systolic function. The cavity was normal. There is no increase in right ventricular wall thickness.  3. The mitral valve is degenerative. There is moderate mitral annular calcification present.  4. The tricuspid valve is normal in structure.  5. The aortic valve is tricuspid Moderate thickening of the aortic valve Moderate calcification of the aortic valve.  6. The pulmonic valve was normal in structure.  Antimicrobials:  Anti-infectives (From admission, onward)   None     Subjective: Describes symptoms as like indigestion Present for years, but worse recently prior to presentation No CP at this time  Objective: Vitals:   07/01/18 1038 07/01/18 1248 07/01/18 1445 07/01/18 1525  BP: (!) 157/76  (!) 156/93 (!) 177/85  Pulse: 94   62  Resp:  20  20  Temp:    97.8 F (36.6 C)  TempSrc:    Oral  SpO2:    95%  Weight:      Height:        Intake/Output Summary (Last 24 hours) at 07/01/2018 1629 Last  data filed at 07/01/2018 1500 Gross per 24 hour  Intake 21.26 ml  Output 625 ml  Net -603.74 ml   Filed Weights   06/30/18 1921 07/01/18 0614  Weight: 80.3 kg 79.1 kg    Examination:  General exam: Appears calm and comfortable  Respiratory system: Clear to auscultation. Respiratory effort normal. Cardiovascular system: S1 & S2 heard, RRR Gastrointestinal system: Abdomen is nondistended, soft and nontender. Central nervous system: Alert and oriented. No focal neurological deficits. Extremities: no LEE Skin: No rashes, lesions or ulcers Psychiatry: Judgement and insight appear normal. Mood & affect appropriate.     Data Reviewed: I have personally reviewed following labs and imaging studies  CBC: Recent Labs  Lab 06/30/18 1248 07/01/18 1200  WBC 10.1 12.5*  NEUTROABS 7.6  --   HGB 18.9* 18.3*  HCT 57.1* 51.8  MCV 91.4 88.1  PLT 211 378   Basic Metabolic Panel: Recent Labs  Lab 06/30/18 1248 06/30/18 1912 07/01/18 1200  NA 135  --  131*  K 3.7  --  2.9*  CL 100  --  96*  CO2 **Note De-Identified vi Obfusction** 21*  --  22  GLUCOSE 113*  --  141*  BUN 19  --  37*  CREATININE 0.94  --  0.94  CALCIUM 9.7  --  8.8*  MG  --  2.3  --   PHOS  --  3.1  --    GFR: Estimted Cretinine Clernce: 70.3 mL/min (by [employer] formul bsed on SCr of 0.94 mg/dL). Liver Function Tests: Recent Lbs  Lb 06/30/18 1248  AST 36  ALT 30  ALKPHOS 99  BILITOT 0.9  PROT 8.9*  ALBUMIN 4.4   No results for input(s): LIPASE, AMYLASE in the lst 168 hours. No results for input(s): AMMONIA in the lst 168 hours. Cogultion Profile: No results for input(s): INR, PROTIME in the lst 168 hours. Crdic Enzymes: Recent Lbs  Lb 06/30/18 1248 06/30/18 1912 06/30/18 2214 07/01/18 0100  TROPONINI 0.04* 0.04* 0.04* 0.04*   BNP (lst 3 results) No results for input(s): PROBNP in the lst 8760 hours. HbA1C: Recent Lbs    07/01/18 0100  HGBA1C 6.1*   CBG: No results for input(s): GLUCAP in the lst 168  hours. Lipid Profile: No results for input(s): CHOL, HDL, LDLCALC, TRIG, CHOLHDL, LDLDIRECT in the lst 72 hours. Thyroid Function Tests: Recent Lbs    07/01/18 0100  TSH 0.285*  FREET4 1.16   Anemi Pnel: No results for input(s): VITAMINB12, FOLATE, FERRITIN, TIBC, IRON, RETICCTPCT in the lst 72 hours. Sepsis Lbs: No results for input(s): PROCALCITON, LATICACIDVEN in the lst 168 hours.  No results found for this or ny previous visit (from the pst 240 hour(s)).       Rdiology Studies: Dg Chest 2 View  Result Dte: 06/30/2018 CLINICAL DATA:  epigstric pin, sob, nd intermittent left sided posterior neck pin.over 1 yer,hx htn,chf,cirrhosis EXAM: CHEST - 2 VIEW COMPARISON:  03/01/2014 nd older exms. FINDINGS: Crdic silhouette is norml in size. No medistinl or hilr msses. No evidence of denopthy. Lungs re cler.  No pleurl effusion or pneumothorx. Multiple heled left-sided rib frctures. Skeletl structures re deminerlized. Mild compression deformities of the lower thorcic nd upper lumbr spine, most likely old. IMPRESSION: No cute crdiopulmonry disese. Electroniclly Signed   By: Ljen Mnes M.D.   On: 06/30/2018 13:24   Nm Myocr Multi W/spect W/wll Motion / Ef  Result Dte: 07/01/2018  There ws no ST segment devition noted during stress.  There is  medium defect of moderte severity present in the bsl inferoseptl, mid inferoseptl nd picl lterl loction. The defect is non-reversible nd consistent with diphrgmtic ttenution rtifct.  There is  smll defect of mild severity present in the picl inferior loction. The defect is reversible nd smll nd consistent with  smll re of ischemi vs. Vritions in Diphrgmtic ttenution rtifct.  The left ventriculr ejection frction is norml (55-65%).  This is  low risk study.         Scheduled Meds: . enoxprin (LOVENOX) injection  40 mg Subcutneous Q24H  .  furosemide  40 mg Orl Dily  . losrtn  25 mg Orl Once  . [START ON 07/02/2018] losrtn  50 mg Orl Dily  . multivitmin with minerls  1 tblet Orl Dily  . potssium chloride  40 mEq Orl Q4H  . proprnolol  40 mg Orl TID   Continuous Infusions: . nitroGLYCERIN Stopped (07/01/18 0747)     LOS: 0 dys    Time spent: over 30 min    Fyrene Helper, MD [facility] AMION  If 7PM-7AM, plese contct  night-coverage [email or web address] Password [facility] 07/01/2018, 4:29 PM

## 2018-07-01 NOTE — Progress Notes (Signed)
[place], [place] paged cardiology about pt BP and nitro gtt. Orders received to stop nitro gtt and administer hydralazine before stress test. Will continue to monitor.

## 2018-07-01 NOTE — Progress Notes (Signed)
Progress Note  Patient Name: [name] [name] Date of Encounter: 07/01/2018  Primary Cardiologist: New to Dr. Sallyanne Kuster  Subjective   Seen in nuc med. Off nitro. Elevated BP. No chest pain.   Inpatient Medications    Scheduled Meds: . enoxaparin (LOVENOX) injection  40 mg Subcutaneous Q24H  . furosemide  40 mg Oral Daily  . losartan  25 mg Oral Daily  . multivitamin with minerals  1 tablet Oral Daily  . propranolol  40 mg Oral TID  . regadenoson  0.4 mg Intravenous Once   Continuous Infusions: . nitroGLYCERIN Stopped (07/01/18 0747)   PRN Meds: acetaminophen, hydrALAZINE, morphine injection, nitroGLYCERIN, ondansetron (ZOFRAN) IV   Vital Signs    Vitals:   07/01/18 0745 07/01/18 0909 07/01/18 0922 07/01/18 0924  BP: (!) 191/102 (!) 188/87 (!) 152/70 (!) 170/80  Pulse:      Resp: (!) 22     Temp:      TempSrc:      SpO2:      Weight:      Height:        Intake/Output Summary (Last 24 hours) at 07/01/2018 1009 Last data filed at 06/30/2018 2359 Gross per 24 hour  Intake 21.26 ml  Output -  Net 21.26 ml   Last 3 Weights 07/01/2018 06/30/2018 08/02/2014  Weight (lbs) 174 lb 6.4 oz 177 lb 200 lb  Weight (kg) 79.107 kg 80.287 kg 90.719 kg      Telemetry    Unable to review as patient seen in nuc med   ECG    SR at rate of 68 bpm, Q wave interiorly  - Personally Reviewed  Physical Exam   GEN: No acute distress.   Neck: No JVD. Carotid bruit.  Cardiac: RRR, systolic murmurs, rubs, or gallops.  Respiratory: Clear to auscultation bilaterally. GI: Soft, nontender, non-distended  MS: No edema; No deformity. Neuro:  Nonfocal  Psych: Normal affect   Labs    Chemistry Recent Labs  Lab 06/30/18 1248  NA 135  K 3.7  CL 100  CO2 21*  GLUCOSE 113*  BUN 19  CREATININE 0.94  CALCIUM 9.7  PROT 8.9*  ALBUMIN 4.4  AST 36  ALT 30  ALKPHOS 99  BILITOT 0.9  GFRNONAA >60  GFRAA >60  ANIONGAP 14     Hematology Recent Labs  Lab 06/30/18 1248  WBC 10.1    RBC 6.25*  HGB 18.9*  HCT 57.1*  MCV 91.4  MCH 30.2  MCHC 33.1  RDW 12.4  PLT 211    Cardiac Enzymes Recent Labs  Lab 06/30/18 1248 06/30/18 1912 06/30/18 2214 07/01/18 0100  TROPONINI 0.04* 0.04* 0.04* 0.04*    Radiology    Dg Chest 2 View  Result Date: 06/30/2018 CLINICAL DATA:  epigastric pain, sob, and intermittent left sided posterior neck pain.over 1 year,hx htn,chf,cirrhosis EXAM: CHEST - 2 VIEW COMPARISON:  03/01/2014 and older exams. FINDINGS: Cardiac silhouette is normal in size. No mediastinal or hilar masses. No evidence of adenopathy. Lungs are clear.  No pleural effusion or pneumothorax. Multiple healed left-sided rib fractures. Skeletal structures are demineralized. Mild compression deformities of the lower thoracic and upper lumbar spine, most likely old. IMPRESSION: No acute cardiopulmonary disease. Electronically Signed   By: Lajean Manes M.D.   On: 06/30/2018 13:24    Cardiac Studies   Pending stress test, echo and renal artery [place]  Patient Profile     68 y.o. male male with a hx of sleep apnea status  post correction surgery 2001, hypertension, hyperlipidemia, GERD, esophageal varices, alcoholic cirrhosis, CHF, anemia, alcohol abuse and vocal cord paralysis seen for hypertensive urgency and chest pain.   Assessment & Plan    1. Hypertensive urgency - Still elevated. Currently off nitro gtt as he is down for stress test.  - Continue current meds. Adjust as recommended by Dr. Loletha Grayer yesterday.  - Pending renal artery [place]  2. Chest pain - Chest pain free currently. Pending stress test and echo. Troponin 0.04 x 4.   3. Carotid bruit - Will need doppler at outpatient  4. Murmur - Pending echo     For questions or updates, please contact [place] Please consult [email or web address] for contact info under        SignedLeanor Kail, PA  07/01/2018, 10:09 AM

## 2018-07-01 NOTE — Progress Notes (Signed)
Patient presented for Lexiscan. Tolerated procedure well. Pending final stress imaging result.  

## 2018-07-02 ENCOUNTER — Observation Stay (HOSPITAL_COMMUNITY): Payer: Medicare Other

## 2018-07-02 DIAGNOSIS — R079 Chest pain, unspecified: Secondary | ICD-10-CM | POA: Diagnosis not present

## 2018-07-02 DIAGNOSIS — R0789 Other chest pain: Secondary | ICD-10-CM | POA: Diagnosis not present

## 2018-07-02 LAB — CBC
HCT: 52 % (ref 39.0–52.0)
Hemoglobin: 18.2 g/dL — ABNORMAL HIGH (ref 13.0–17.0)
MCH: 31.4 pg (ref 26.0–34.0)
MCHC: 35 g/dL (ref 30.0–36.0)
MCV: 89.7 fL (ref 80.0–100.0)
NRBC: 0 % (ref 0.0–0.2)
Platelets: 220 10*3/uL (ref 150–400)
RBC: 5.8 MIL/uL (ref 4.22–5.81)
RDW: 12.9 % (ref 11.5–15.5)
WBC: 11.7 10*3/uL — ABNORMAL HIGH (ref 4.0–10.5)

## 2018-07-02 LAB — COMPREHENSIVE METABOLIC PANEL
ALT: 30 U/L (ref 0–44)
AST: 26 U/L (ref 15–41)
Albumin: 3.6 g/dL (ref 3.5–5.0)
Alkaline Phosphatase: 96 U/L (ref 38–126)
Anion gap: 10 (ref 5–15)
BUN: 38 mg/dL — ABNORMAL HIGH (ref 8–23)
CO2: 24 mmol/L (ref 22–32)
CREATININE: 1.13 mg/dL (ref 0.61–1.24)
Calcium: 8.8 mg/dL — ABNORMAL LOW (ref 8.9–10.3)
Chloride: 99 mmol/L (ref 98–111)
GFR calc Af Amer: >60 mL/min (ref >60)
GFR calc non Af Amer: >60 mL/min (ref >60)
Glucose, Bld: 112 mg/dL — ABNORMAL HIGH (ref 70–99)
Potassium: 4.1 mmol/L (ref 3.5–5.1)
Sodium: 133 mmol/L — ABNORMAL LOW (ref 135–145)
Total Bilirubin: 0.8 mg/dL (ref 0.3–1.2)
Total Protein: 7.3 g/dL (ref 6.5–8.1)

## 2018-07-02 LAB — LIPID PANEL
Cholesterol: 189 mg/dL (ref 0–200)
HDL: 33 mg/dL — ABNORMAL LOW (ref >40)
LDL CALC: 125 mg/dL — AB (ref 0–99)
Total CHOL/HDL Ratio: 5.7 RATIO
Triglycerides: 156 mg/dL — ABNORMAL HIGH (ref <150)
VLDL: 31 mg/dL (ref 0–40)

## 2018-07-02 LAB — FERRITIN: Ferritin: 238 ng/mL (ref 24–336)

## 2018-07-02 LAB — IRON AND TIBC
Iron: 93 ug/dL (ref 45–182)
Saturation Ratios: 29 % (ref 17.9–39.5)
TIBC: 322 ug/dL (ref 250–450)
UIBC: 229 ug/dL

## 2018-07-02 LAB — MAGNESIUM: Magnesium: 2.6 mg/dL — ABNORMAL HIGH (ref 1.7–2.4)

## 2018-07-02 MED ORDER — LOSARTAN POTASSIUM 50 MG PO TABS: 50.0000 mg | ORAL_TABLET | Freq: Every day | ORAL | 0 refills | Status: DC

## 2018-07-02 MED ORDER — IOHEXOL 350 MG/ML SOLN
75.0000 mL | Freq: Once | INTRAVENOUS | Status: AC | PRN
  Administered 2018-07-02: 75 mL via INTRAVENOUS

## 2018-07-02 MED ORDER — PROPRANOLOL HCL 40 MG PO TABS: 40.0000 mg | ORAL_TABLET | Freq: Three times a day (TID) | ORAL | 0 refills | Status: DC

## 2018-07-02 MED ORDER — SPIRONOLACTONE 25 MG PO TABS: 12.5000 mg | ORAL_TABLET | Freq: Every day | ORAL | 0 refills | Status: DC

## 2018-07-02 MED ORDER — FUROSEMIDE 40 MG PO TABS: 40.0000 mg | ORAL_TABLET | Freq: Every day | ORAL | 0 refills | Status: DC

## 2018-07-02 NOTE — Progress Notes (Signed)
Progress Note  Patient Name: [name] [name] Date of Encounter: 07/02/2018  Primary Cardiologist: New, [place]  Subjective   No complaints  Inpatient Medications    Scheduled Meds: . enoxaparin (LOVENOX) injection  40 mg Subcutaneous Q24H  . furosemide  40 mg Oral Daily  . losartan  50 mg Oral Daily  . multivitamin with minerals  1 tablet Oral Daily  . propranolol  40 mg Oral TID  . spironolactone  12.5 mg Oral Daily   Continuous Infusions:  PRN Meds: acetaminophen, hydrALAZINE, morphine injection, nitroGLYCERIN, ondansetron (ZOFRAN) IV   Vital Signs    Vitals:   07/02/18 0150 07/02/18 0357 07/02/18 0725 07/02/18 0824  BP:  (!) 167/92 (!) 180/94 (!) 166/89  Pulse:  (!) 57  (!) 59  Resp:  18 (!) 29   Temp:  (!) 97.5 F (36.4 C)    TempSrc:  Oral    SpO2:  98%    Weight: 79.6 kg     Height:        Intake/Output Summary (Last 24 hours) at 07/02/2018 0936 Last data filed at 07/02/2018 0359 Gross per 24 hour  Intake 1238.06 ml  Output 1575 ml  Net -336.94 ml   Last 3 Weights 07/02/2018 07/01/2018 06/30/2018  Weight (lbs) 175 lb 6.4 oz 174 lb 6.4 oz 177 lb  Weight (kg) 79.561 kg 79.107 kg 80.287 kg      Telemetry    SR - Personally Reviewed  ECG    na - Personally Reviewed  Physical Exam   GEN: No acute distress.   Neck: No JVD Cardiac: RRR, 3/6 systolic murmur rusb Respiratory: Clear to auscultation bilaterally. GI: Soft, nontender, non-distended  MS: No edema; No deformity. Neuro:  Nonfocal  Psych: Normal affect   Labs    Chemistry Recent Labs  Lab 06/30/18 1248 07/01/18 1200 07/02/18 0330  NA 135 131* 133*  K 3.7 2.9* 4.1  CL 100 96* 99  CO2 21* 22 24  GLUCOSE 113* 141* 112*  BUN 19 37* 38*  CREATININE 0.94 0.94 1.13  CALCIUM 9.7 8.8* 8.8*  PROT 8.9*  --  7.3  ALBUMIN 4.4  --  3.6  AST 36  --  26  ALT 30  --  30  ALKPHOS 99  --  96  BILITOT 0.9  --  0.8  GFRNONAA >60 >60 >60  GFRAA >60 >60 >60  ANIONGAP 14 13 10       Hematology Recent Labs  Lab 06/30/18 1248 07/01/18 1200 07/02/18 0330  WBC 10.1 12.5* 11.7*  RBC 6.25* 5.88* 5.80  HGB 18.9* 18.3* 18.2*  HCT 57.1* 51.8 52.0  MCV 91.4 88.1 89.7  MCH 30.2 31.1 31.4  MCHC 33.1 35.3 35.0  RDW 12.4 12.7 12.9  PLT 211 192 220    Cardiac Enzymes Recent Labs  Lab 06/30/18 1248 06/30/18 1912 06/30/18 2214 07/01/18 0100  TROPONINI 0.04* 0.04* 0.04* 0.04*   No results for input(s): TROPIPOC in the last 168 hours.   BNPNo results for input(s): BNP, PROBNP in the last 168 hours.   DDimer No results for input(s): DDIMER in the last 168 hours.   Radiology    Dg Chest 2 View  Result Date: 06/30/2018 CLINICAL DATA:  epigastric pain, sob, and intermittent left sided posterior neck pain.over 1 year,hx htn,chf,cirrhosis EXAM: CHEST - 2 VIEW COMPARISON:  03/01/2014 and older exams. FINDINGS: Cardiac silhouette is normal in size. No mediastinal or hilar masses. No evidence of adenopathy. Lungs are clear.  No  pleural effusion or pneumothorax. Multiple healed left-sided rib fractures. Skeletal structures are demineralized. Mild compression deformities of the lower thoracic and upper lumbar spine, most likely old. IMPRESSION: No acute cardiopulmonary disease. Electronically Signed   By: Lajean Manes M.D.   On: 06/30/2018 13:24   Nm Myocar Multi W/spect W/wall Motion / Ef  Result Date: 07/01/2018  There was no ST segment deviation noted during stress.  There is a medium defect of moderate severity present in the basal inferoseptal, mid inferoseptal and apical lateral location. The defect is non-reversible and consistent with diaphragmatic attenuation artifact.  There is a small defect of mild severity present in the apical inferior location. The defect is reversible and small and consistent with a small area of ischemia vs. Variations in Diaphragmatic attenuation artifact.  The left ventricular ejection fraction is normal (55-65%).  This is a low risk study.      Cardiac Studies     Patient Profile     [name] [name] a 68 y.o.malewith a hx of sleep apnea status post correction surgery 2001, hypertension, hyperlipidemia, GERD, esophageal varices, alcoholic cirrhosis, CHF, anemia,alcohol abuse and vocal cord paralysiswho is being seen today for the evaluation of difficult to control hypertension and chest painat the request ofDr. Alfredia Ferguson.  Assessment & Plan    1. 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 To me he reports it can be tender to palpation at times.   - nuclear stress overall low risk, possibly small mild ischemia vs artifact. Echo with normal LVEF, no WMAs - no further cardiac testing planned at this time   2. HTN - multiple adverse reactions to meds - currently on propanolol 40mg  tid (increased), started on losartan 25mg  daily. Room to titrate losartan, could aldactone. Does not appear he got losartan yesterday, first dose for today. Has been getting prn hydralazine IV. Transiently on IV NG gtt now off.  - repeat vascular renal artery [place], as outpatient if fine  - losartan up to 50mg  daily, started on aldactone 12.5 mg - further titratration as outpatient.   3. Carotid bruits - will need outpatient carotid US   Ok for discharge. At f/u will need arrange renal artery [place], carotid US, and would also recheck  BMET on losartan and aldactone    For questions or updates, please contact [place] Please consult [email or web address] for contact info under        Signed, Carlyle Dolly, MD  07/02/2018, 9:36 AM

## 2018-07-02 NOTE — Discharge Summary (Signed)
**Note De-Identified vi Obfusction** Physicin Dischrge Summry  [name] [name] RXV:[id number] DOB: 1951/02/16 DOA: 06/30/2018  PCP: [name] Downing, MD  Admit dte: 06/30/2018 Dischrge dte: 07/02/2018  Time spent: 40 minutes  Recommendtions for Outptient Follow-up:  1. Follow up outptient CBC/CMP 2. Follow up blood pressure with PCP s outptient.  Strted on losrtn nd spironolctone here nd propnolol incresed to TID.  Lsix 40 mg dily continued. 3. Follow up cholesterol s outptient.  Consider tril of lterntive sttin.  Also consider spirin for primry prevention bsed on ASCVD risk. 4. Follow up renl rtery ultrsound s outptient s well s crotid ultrsound 5. Repet thyroid function tests s outptient 6. Follow up polycythemi s outptient   Dischrge Dignoses:  Active Problems:   Systolic CHF, chronic (HCC)   Cirrhosis (HCC)   Esophgel vrices (HCC)   Alcohol buse   Atypicl chest pin   GERD (gstroesophgel reflux disese)   Hypertensive urgency   Dischrge Condition: stble  Diet recommendtion: hert helthy  Filed Weights   06/30/18 1921 07/01/18 0614 07/02/18 0150  Weight: 80.3 kg 79.1 kg 79.6 kg    History of present illness:  Per HPI Mrgrite [name]  68 y.o.mlewith medicl history significnt ofchronic systolic CHF, history of lcoholic cirrhosis nd esophgel vrices, history of GERD, history of nemi, prior lcohol buse who quit 3 yers go, hypertension, hyperlipidemi, history of obstructive sleep pne, other comorbidities who presents with chief complint of midsternl chest pin which is progressively gotten worse over the lst 3 weeks nd worsened to the point tody. Ptient sttes tht the chest pin is going on for lmost  yer but significntly worse in the lst 3 weeks nd then cutely worsened tody nd ws intolerble. Ptient sttes tht his norml blood pressures run high usully in the systolics of 509T to 267T t home. Ptient sttes the  pin ws relly bd round 5 AM this morning nd he just round the house when it strted. He described s  constnt pin nd sttes tht it hurts worse when down nd it gets  little bit better when he lens forwrd. Ptient sttes tht it "hurts in the bottom of his sternum"nd describes the pin s  6 out of 10 in severity nd describes it s  dull toothche. He denies ny numbness or tingling but then did hve lso neck pin. Sttes tht whenever he gets chest pin he lso developed some nuse or vomiting nd tody ws very diphoretic. Denies ny hedches. No burning or discomfort in his urine. Ptient tkes furosemide nd propnolol for blood pressure mediction s well s his cirrhosis. Ptient then lter on describes the pin s "cid reflux tht stys". Also described occsionlly eting cuses the pin to get better then lso sttes tht whenever he gets this pin tht it goes wy on its own fter rest. Ptient stopped drinkingyers go. Becuse of his complints nd severely uncontrolled hypertension TRH ws sked dmit this ptient for typicl chest pin rule out ACS long with uncontrolled hypertension/hypertensive urgency.  [name] ws dmitted for CP rule out in setting of significnt hypertension.  He ruled out for ACS with negtive troponins nd hd  stress test which ws low risk.  He ws strted on losrtn nd spironolctone nd his propnolol ws incresed to TID.  He hd dissection study prior to dischrge which ws negtive.   His blood pressure ws improved t dischrge in the 245'Y-099'I systolic.    See below for dditionl detils.  Hospitl Course:  Atypicl Chest Pin **Note De-Identified vi Obfusction** r/o ACS in the Setting of Severely Elevted BP/HTN Urgency  -Ptient with longstnding CP but sttes it cutely worsened in the lst 3 weeks nd ws terrible on dy of dmission preventing sleep -Ptient describes Pin in his Mnubrium/Xyphoid Are but lso sttes rdition into his Left  Neck. Sttes tht lying flt mkes the pin worse nd sitting up helps nd pin is sometimes gets better with eting. Other times he describes the pin going wy positionlly  - Troponins minimlly elevted nd flt - EKG showed  Sinus Rhythm nd q wves in Leds II nd III s well s borderline ST Elevtion in V2 nd V3.  Repet EKG 2/29 AM ppers similr (?q in II, q in III nd VF). -Ptient given 1 dose of SL Nitroglycerin, ASA 324 mg po, Fentnyl 50 mcg - Lipid Pnel (LDL 125, HDL 33) nd HbA1c (predibetes) - crdiology c/s, pprecite recs - echo with norml EF, no WMA reported (see report).  Low risk stress test.  Recommending outptient renl rtery Kore nd crotid dopplers.  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  Iron studies within norml.  Abnorml Thyroid Function Tests: repet outptient (subclinicl hyperthyroidism)  Hypoklemi: replce nd follow, follow mg  Procedures: Echo IMPRESSIONS    1. The left ventricle hs norml systolic function with n ejection frction of 60-65%. The cvity size ws norml. Left ventriculr distolic Doppler prmeters re consistent with impired relxtion.  2. The right ventricle hs norml systolic function. The cvity ws norml. There is no increse in right ventriculr wll thickness.  3. The mitrl vlve is  degenertive. There is moderte mitrl nnulr clcifiction present.  4. The tricuspid vlve is norml in structure.  5. The ortic vlve is tricuspid Moderte thickening of the ortic vlve Moderte clcifiction of the ortic vlve.  6. The pulmonic vlve ws norml in structure.  Consulttions:  crdiology  Dischrge Exm: Vitls:   07/02/18 0824 07/02/18 1004  BP: (!) 166/89 (!) 159/89  Pulse: (!) 59   Resp:  13  Temp:  97.8 F (36.6 C)  SpO2:  95%   Doing well.  No pin.  Discussed follow up outptient with crds/PCP  Generl: No cute distress. Crdiovsculr: Hert sounds show  regulr rte, nd rhythm. Lungs: Cler to usculttion bilterlly  Abdomen: Soft, nontender, nondistended  Neurologicl: Alert nd oriented 3. Moves ll extremities 4. Crnil nerves II through XII grossly intct. Skin: Wrm nd dry. No rshes or lesions. Extremities: No clubbing or cynosis. No edem.  Psychitric: Mood nd ffect re norml. Insight nd judgment re pproprite.   Dischrge Instructions   Dischrge Instructions    Cll MD for:  difficulty brething, hedche or visul disturbnces   Complete by:  As directed    Cll MD for:  extreme ftigue   Complete by:  As directed    Cll MD for:  hives   Complete by:  As **Note De-Identified vi Obfusction** directed    Cll MD for:  persistnt dizziness or light-hededness   Complete by:  As directed    Cll MD for:  persistnt nuse nd vomiting   Complete by:  As directed    Cll MD for:  redness, tenderness, or signs of infection (pin, swelling, redness, odor or green/yellow dischrge round incision site)   Complete by:  As directed    Cll MD for:  severe uncontrolled pin   Complete by:  As directed    Cll MD for:  temperture >100.4   Complete by:  As directed    Diet - low sodium hert helthy   Complete by:  As directed    Dischrge instructions   Complete by:  As directed    You were seen for chest pin nd high blood pressure.  Your  symptoms hve improved.  You hd  low risk stress test, which is ressuring.  Plese follow up with your crdiologist s n outptient.   Your blood pressure meds hve been djusted.  Tke propnolol 40 mg three times dily, spironolctone 12.5 mg dily, losrtn 50 mg dily, nd lsix 40 mg dily.  You should follow up with your PCP within  few dys to recheck your blood pressure nd your lbs.  We ordered  renl rtery ultrsound here, but it hs not been performed yet.  You should get this s n outptient with your PCP or crdiologist.  You lso need crotid ultrsounds s n outptient.    Plese follow up with your PCP s n outptient.  You've been intolernt to sttins (cholesterol lowering drugs) before, but I think tht you would benefit from one.  Plese discuss triling  cholesterol lowering drug with your PCP s n outptient.  You should lso discuss considering  dily spirin.  Your red blood cells re elevted.  Plese follow up with your PCP s n outptient to consider further workup.  Return for new, recurrent, or worsening symptoms.  Plese sk your PCP to request records from this hospitliztion so they know wht ws done nd wht the next steps will be.   Increse ctivity slowly   Complete by:  As directed      Allergies s of 07/02/2018      Rections   Adhesive [tpe] Other (See Comments)   REACTION: blisters.  Use pper tpe only   Amlodipine Other (See Comments)   REACTION:  unknown   Amoxicillin Hives   Did it involve swelling of the fce/tongue/throt, SOB, or low BP? No Did it involve sudden or severe rsh/hives, skin peeling, or ny rection on the inside of your mouth or nose? No Did you need to seek medicl ttention t  hospitl or doctor's office? No When did it lst hppen? childhood If ll bove nswers re "NO", my proceed with cephlosporin use.   Atenolol Other (See Comments)   REACTION:  unknown   Celebrex [celecoxib] Nuse And  Vomiting   Also Vioxx   Cephlosporins Hives   Clindmycin/lincomycin Hives   Dexon [dexmethsone Sodium Phosphte] Other (See Comments)   REACTION:  unknown   Doxzosin Mesylte Other (See Comments)   REACTION: unknown   Durprep [ntiseptic Products, Misc.]    Enlpril Other (See Comments)   REACTION: dry cough   Erythromycin Hives   Hctz [hydrochlorothizide] Other (See Comments)   REACTION: severe sensitivity to light, burning senstion   Iodine    AGENT: Durprep REACTION:  blisters   Lipitor [torvsttin]    Other **Note De-Identified vi Obfusction** AGENT:Vicryl, cephlnxon REACTION:  "body rejects it"   Toprol Xl [metoprolol Succinte]    Verpmil Other (See Comments)   REACTION:  Dry cough   Vioxx [rofecoxib] Hives   Zocor [simvsttin] Nuse And Vomiting   Ltex Rsh      Mediction List    STOP tking these medictions   metoprolol trtrte 25 MG tblet Commonly known s:  LOPRESSOR     TAKE these medictions   cetminophen 325 MG tblet Commonly known s:  TYLENOL Tke 1-2 tblets (325-650 mg totl) by mouth every 6 (six) hours s needed for mild pin.   docuste 50 MG/5ML liquid Commonly known s:  COLACE Tke 10 mLs (100 mg totl) by mouth 2 (two) times dily. As stool softner   furosemide 40 MG tblet Commonly known s:  LASIX Tke 1 tblet (40 mg totl) by mouth dily for 30 dys.   losrtn 50 MG tblet Commonly known s:  COZAAR Tke 1 tblet (50 mg totl) by mouth dily for 30 dys. Strt tking on:  Mrch 2, 2020   multivitmin with minerls Tbs tblet Tke 1 tblet by mouth dily.   omeprzole 20 MG cpsule Commonly known s:  PRILOSEC Tke 2 cpsules (40 mg totl) by mouth dily.   proprnolol 40 MG tblet Commonly known s:  INDERAL Tke 1 tblet (40 mg totl) by mouth 3 (three) times dily for 30 dys. Wht chnged:  when to tke this   senn 8.6 MG Tbs tblet Commonly known s:  SENOKOT Tke 2 tblets (17.2 mg totl) by mouth dily.    spironolctone 25 MG tblet Commonly known s:  ALDACTONE Tke 0.5 tblets (12.5 mg totl) by mouth dily for 30 dys. Strt tking on:  Mrch 2, 2020      Allergies  Allergen Rections  . Adhesive [Tpe] Other (See Comments)    REACTION: blisters.  Use pper tpe only  . Amlodipine Other (See Comments)    REACTION:  unknown  . Amoxicillin Hives    Did it involve swelling of the fce/tongue/throt, SOB, or low BP? No Did it involve sudden or severe rsh/hives, skin peeling, or ny rection on the inside of your mouth or nose? No Did you need to seek medicl ttention t  hospitl or doctor's office? No When did it lst hppen? childhood If ll bove nswers re "NO", my proceed with cephlosporin use.   . Atenolol Other (See Comments)    REACTION:  unknown  . Celebrex [Celecoxib] Nuse And Vomiting    Also Vioxx  . Cephlosporins Hives  . Clindmycin/Lincomycin Hives  . Dexon [Dexmethsone Sodium Phosphte] Other (See Comments)    REACTION:  unknown  . Doxzosin Mesylte Other (See Comments)    REACTION: unknown  . Durprep [employer], Misc.]   . Enlpril Other (See Comments)    REACTION: dry cough  . Erythromycin Hives  . Hctz [Hydrochlorothizide] Other (See Comments)    REACTION: severe sensitivity to light, burning senstion  . Iodine     AGENT: Durprep REACTION:  blisters  . Lipitor [Atorvsttin]   . Other     AGENT:Vicryl, cephlnxon REACTION:  "body rejects it"  . Toprol Xl [Metoprolol Succinte]   . Verpmil Other (See Comments)    REACTION:  Dry cough  . Vioxx [Rofecoxib] Hives  . Zocor [Simvsttin] Nuse And Vomiting  . Ltex Rsh   Follow-up Informtion    [name] Downing, MD Follow up.   Specilty:  [facility] Contct informtion: [address] **Note De-Identified vi Obfusction** [place] [address] [phone]            The results of significnt dignostics from this hospitliztion (including imging, microbiology,  ncillry nd lbortory) re listed below for reference.    Significnt Dignostic Studies: Dg Chest 2 View  Result Dte: 06/30/2018 CLINICAL DATA:  epigstric pin, sob, nd intermittent left sided posterior neck pin.over 1 yer,hx htn,chf,cirrhosis EXAM: CHEST - 2 VIEW COMPARISON:  03/01/2014 nd older exms. FINDINGS: Crdic silhouette is norml in size. No medistinl or hilr msses. No evidence of denopthy. Lungs re cler.  No pleurl effusion or pneumothorx. Multiple heled left-sided rib frctures. Skeletl structures re deminerlized. Mild compression deformities of the lower thorcic nd upper lumbr spine, most likely old. IMPRESSION: No cute crdiopulmonry disese. Electroniclly Signed   By: Ljen Mnes M.D.   On: 06/30/2018 13:24   Nm Myocr Multi W/spect W/wll Motion / Ef  Result Dte: 07/01/2018  There ws no ST segment devition noted during stress.  There is  medium defect of moderte severity present in the bsl inferoseptl, mid inferoseptl nd picl lterl loction. The defect is non-reversible nd consistent with diphrgmtic ttenution rtifct.  There is  smll defect of mild severity present in the picl inferior loction. The defect is reversible nd smll nd consistent with  smll re of ischemi vs. Vritions in Diphrgmtic ttenution rtifct.  The left ventriculr ejection frction is norml (55-65%).  This is  low risk study.    Ct Angio Chest Aort W/cm &/or Wo/cm  Result Dte: 07/02/2018 CLINICAL DATA:  Hypertension, chest pin EXAM: CT ANGIOGRAPHY CHEST WITH CONTRAST TECHNIQUE: Multidetector CT imging of the chest ws performed using the stndrd protocol during bolus dministrtion of intrvenous contrst. Multiplnr CT imge reconstructions nd MIPs were obtined to evlute the vsculr ntomy. CONTRAST:  66mL OMNIPAQUE IOHEXOL 350 MG/ML SOLN COMPARISON:  02/27/2014 FINDINGS: Crdiovsculr: Moderte coronry nd ortic rteril  clcifictions. Hert size norml. No pericrdil effusion. Fir contrst opcifiction of pulmonry rtery brnches. No convincing filling defects to suggest cute PE. Adequte contrst opcifiction of the thorcic ort with no evidence of dissection, neurysm, or stenosis. There is clssic 3-vessel brchiocephlic rch ntomy without proximl stenosis. Medistinum/Nodes: No hilr or medistinl denopthy. Lungs/Pleur: No pleurl effusion. No pneumothorx. Lungs re cler. Upper Abdomen: 0.9 cm prtilly clcified stone in the dependent spect of the nondistended gllbldder. No focl liver lesion or biliry ductl dilttion. No cute findings. Musculoskeletl: Spondylitic chnges in the lower cervicl spine. Multiple old heled left rib frctures. Mild L1 superior endplte compression deformity, stble since 04/15/2014. no cute frcture or worrisome bone lesion. Review of the MIP imges confirms the bove findings. IMPRESSION: 1. Negtive for cute PE or thorcic ortic dissection. 2. Coronry nd Aortic Atherosclerosis (ICD10-I70.0). 3. Cholelithisis. Electroniclly Signed   By: Lucrezi Europe M.D.   On: 07/02/2018 12:13    Microbiology: No results found for this or ny previous visit (from the pst 240 hour(s)).   Lbs: Bsic Metbolic Pnel: Recent Lbs  Lb 06/30/18 1248 06/30/18 1912 07/01/18 1200 07/02/18 0330  NA 135  --  131* 133*  K 3.7  --  2.9* 4.1  CL 100  --  96* 99  CO2 21*  --  22 24  GLUCOSE 113*  --  141* 112*  BUN 19  --  37* 38*  CREATININE 0.94  --  0.94 1.13  CALCIUM 9.7  --  8.8* 8.8*  MG  --  2.3  --  2.6*  PHOS  -- 3.1  --   --    Liver Function Tests: Recent Labs  Lab 06/30/18 1248 07/02/18 0330  AST 36 26  ALT 30 30  ALKPHOS 99 96  BILITOT 0.9 0.8  PROT 8.9* 7.3  ALBUMIN 4.4 3.6   No results for input(s): LIPASE, AMYLASE in the last 168 hours. No results for input(s): AMMONIA in the last 168 hours. CBC: Recent Labs  Lab 06/30/18 1248  07/01/18 1200 07/02/18 0330  WBC 10.1 12.5* 11.7*  NEUTROABS 7.6  --   --   HGB 18.9* 18.3* 18.2*  HCT 57.1* 51.8 52.0  MCV 91.4 88.1 89.7  PLT 211 192 220   Cardiac Enzymes: Recent Labs  Lab 06/30/18 1248 06/30/18 1912 06/30/18 2214 07/01/18 0100  TROPONINI 0.04* 0.04* 0.04* 0.04*   BNP: BNP (last 3 results) No results for input(s): BNP in the last 8760 hours.  ProBNP (last 3 results) No results for input(s): PROBNP in the last 8760 hours.  CBG: No results for input(s): GLUCAP in the last 168 hours.     Signed:  Fayrene Helper MD.  [facility] 07/02/2018, 6:31 PM

## 2019-07-30 ENCOUNTER — Ambulatory Visit: Payer: Medicare Other | Attending: Internal Medicine

## 2019-07-30 DIAGNOSIS — Z23 Encounter for immunization: Secondary | ICD-10-CM

## 2019-07-30 NOTE — Progress Notes (Signed)
   Covid-19 Vaccination Clinic  Name:  [name] [name]    MRN: [id number] DOB: 1950/05/29  07/30/2019  [name] was observed post Covid-19 immunization for 15 minutes without incident. He was provided with Vaccine Information Sheet and instruction to access the V-Safe system.   [name] was instructed to call 911 with any severe reactions post vaccine: Marland Kitchen Difficulty breathing  . Swelling of face and throat  . A fast heartbeat  . A bad rash all over body  . Dizziness and weakness   Immunizations Administered    Name Date Dose VIS Date Route   Pfizer COVID-19 Vaccine 07/30/2019  2:53 PM 0.3 mL 04/13/2019 Intramuscular   Manufacturer: [place]   Lot: [id number]   [place]: [id number]

## 2019-08-22 ENCOUNTER — Ambulatory Visit: Payer: Medicare Other | Attending: Internal Medicine

## 2019-08-22 DIAGNOSIS — Z23 Encounter for immunization: Secondary | ICD-10-CM

## 2019-08-22 NOTE — Progress Notes (Signed)
   Covid-19 Vaccination Clinic  Name:  DARIVS GOEDEKE    MRN: [id number] DOB: [birth year]  08/22/2019  Mr. Edghill was observed post Covid-19 immunization for 15 minutes without incident. He was provided with Vaccine Information Sheet and instruction to access the V-Safe system.   Mr. Traw was instructed to call 911 with any severe reactions post vaccine: Marland Kitchen Difficulty breathing  . Swelling of face and throat  . A fast heartbeat  . A bad rash all over body  . Dizziness and weakness   Immunizations Administered    Name Date Dose VIS Date Route   Pfizer COVID-19 Vaccine 08/22/2019  2:03 PM 0.3 mL 06/27/2018 Intramuscular   Manufacturer: [place]   Lot: [id number]   [place]: [id number]

## 2019-09-20 ENCOUNTER — Other Ambulatory Visit: Payer: Self-pay

## 2019-09-20 ENCOUNTER — Emergency Department (HOSPITAL_COMMUNITY)
Admission: EM | Admit: 2019-09-20 | Discharge: 2019-09-20 | Discharge status: Against Medical Advice | Payer: Medicare Other | Attending: Emergency Medicine | Admitting: Emergency Medicine

## 2019-09-20 ENCOUNTER — Encounter (HOSPITAL_COMMUNITY): Payer: Self-pay | Admitting: *Deleted

## 2019-09-20 DIAGNOSIS — F10129 Alcohol abuse with intoxication, unspecified: Secondary | ICD-10-CM | POA: Diagnosis not present

## 2019-09-20 DIAGNOSIS — Z91040 Latex allergy status: Secondary | ICD-10-CM | POA: Insufficient documentation

## 2019-09-20 DIAGNOSIS — Z79899 Other long term (current) drug therapy: Secondary | ICD-10-CM | POA: Insufficient documentation

## 2019-09-20 DIAGNOSIS — Y906 Blood alcohol level of 120-199 mg/100 ml: Secondary | ICD-10-CM | POA: Insufficient documentation

## 2019-09-20 DIAGNOSIS — I5022 Chronic systolic (congestive) heart failure: Secondary | ICD-10-CM | POA: Diagnosis not present

## 2019-09-20 DIAGNOSIS — Z20822 Contact with and (suspected) exposure to covid-19: Secondary | ICD-10-CM | POA: Insufficient documentation

## 2019-09-20 DIAGNOSIS — F1010 Alcohol abuse, uncomplicated: Secondary | ICD-10-CM

## 2019-09-20 DIAGNOSIS — I110 Hypertensive heart disease with heart failure: Secondary | ICD-10-CM | POA: Insufficient documentation

## 2019-09-20 LAB — COMPREHENSIVE METABOLIC PANEL
ALT: 25 U/L (ref 0–44)
AST: 30 U/L (ref 15–41)
Albumin: 3.6 g/dL (ref 3.5–5.0)
Alkaline Phosphatase: 99 U/L (ref 38–126)
Anion gap: 19 — ABNORMAL HIGH (ref 5–15)
BUN: 12 mg/dL (ref 8–23)
CO2: 17 mmol/L — ABNORMAL LOW (ref 22–32)
Calcium: 9 mg/dL (ref 8.9–10.3)
Chloride: 104 mmol/L (ref 98–111)
Creatinine, Ser: 0.87 mg/dL (ref 0.61–1.24)
GFR calc Af Amer: >60 mL/min (ref >60)
GFR calc non Af Amer: >60 mL/min (ref >60)
Glucose, Bld: 83 mg/dL (ref 70–99)
Potassium: 3.8 mmol/L (ref 3.5–5.1)
Sodium: 140 mmol/L (ref 135–145)
Total Bilirubin: 0.9 mg/dL (ref 0.3–1.2)
Total Protein: 7.5 g/dL (ref 6.5–8.1)

## 2019-09-20 LAB — CBC
HCT: 42.2 % (ref 39.0–52.0)
Hemoglobin: 14.3 g/dL (ref 13.0–17.0)
MCH: 32.1 pg (ref 26.0–34.0)
MCHC: 33.9 g/dL (ref 30.0–36.0)
MCV: 94.8 fL (ref 80.0–100.0)
Platelets: 323 10*3/uL (ref 150–400)
RBC: 4.45 MIL/uL (ref 4.22–5.81)
RDW: 13.3 % (ref 11.5–15.5)
WBC: 7 10*3/uL (ref 4.0–10.5)
nRBC: 0 % (ref 0.0–0.2)

## 2019-09-20 LAB — SARS CORONAVIRUS 2 BY RT PCR (HOSPITAL ORDER, PERFORMED IN ~~LOC~~ HOSPITAL LAB): SARS Coronavirus 2: NEGATIVE

## 2019-09-20 LAB — POC OCCULT BLOOD, ED: Fecal Occult Bld: POSITIVE — AB

## 2019-09-20 LAB — ETHANOL
Alcohol, Ethyl (B): 179 mg/dL — ABNORMAL HIGH (ref <10)
Alcohol, Ethyl (B): 10 mg/dL — ABNORMAL HIGH (ref <10)

## 2019-09-20 MED ORDER — THIAMINE HCL 100 MG PO TABS: 100.0000 mg | ORAL_TABLET | Freq: Every day | ORAL | Status: DC

## 2019-09-20 MED ORDER — LORAZEPAM 1 MG PO TABS: 0.0000 mg | ORAL_TABLET | Freq: Four times a day (QID) | ORAL | Status: DC

## 2019-09-20 MED ORDER — THIAMINE HCL 100 MG/ML IJ SOLN
100.0000 mg | Freq: Every day | INTRAMUSCULAR | Status: DC
  Administered 2019-09-20: 100 mg via INTRAVENOUS
  Filled 2019-09-20: qty 2

## 2019-09-20 MED ORDER — ONDANSETRON HCL 4 MG/2ML IJ SOLN
4.0000 mg | Freq: Once | INTRAMUSCULAR | Status: AC
  Administered 2019-09-20: 4 mg via INTRAVENOUS
  Filled 2019-09-20: qty 2

## 2019-09-20 MED ORDER — SODIUM CHLORIDE 0.9 % IV BOLUS
1000.0000 mL | Freq: Once | INTRAVENOUS | Status: AC
  Administered 2019-09-20: 1000 mL via INTRAVENOUS

## 2019-09-20 MED ORDER — LORAZEPAM 2 MG/ML IJ SOLN
0.0000 mg | Freq: Two times a day (BID) | INTRAMUSCULAR | Status: DC
  Administered 2019-09-20: 1 mg via INTRAVENOUS

## 2019-09-20 MED ORDER — LORAZEPAM 1 MG PO TABS: 0.0000 mg | ORAL_TABLET | Freq: Two times a day (BID) | ORAL | Status: DC

## 2019-09-20 MED ORDER — LORAZEPAM 2 MG/ML IJ SOLN
0.0000 mg | Freq: Four times a day (QID) | INTRAMUSCULAR | Status: DC
  Administered 2019-09-20: 2 mg via INTRAVENOUS
  Filled 2019-09-20 (×2): qty 1

## 2019-09-20 MED ORDER — LOSARTAN POTASSIUM 50 MG PO TABS
50.0000 mg | ORAL_TABLET | Freq: Once | ORAL | Status: AC
  Administered 2019-09-20: 50 mg via ORAL
  Filled 2019-09-20: qty 1

## 2019-09-20 MED ORDER — CHLORDIAZEPOXIDE HCL 25 MG PO CAPS
ORAL_CAPSULE | ORAL | 0 refills | Status: DC
Start: 2019-09-20 — End: 2022-08-02

## 2019-09-20 MED ORDER — LORAZEPAM 2 MG/ML IJ SOLN
2.0000 mg | Freq: Once | INTRAMUSCULAR | Status: AC
  Administered 2019-09-20: 2 mg via INTRAVENOUS
  Filled 2019-09-20: qty 1

## 2019-09-20 NOTE — ED Provider Notes (Signed)
Medical screening examination/treatment/procedure(s) were conducted as a shared visit with non-physician practitioner(s) and myself.  I personally evaluated the patient during the encounter.    Patient with significant history of alcohol abuse.  He reports drinking 1-1/2 gallons of vodka per day.  He reports wanting to get help and trying to quit again.  He thinks he is in withdrawal.  He stopped drinking at about 4 AM.  He denies history of alcohol withdrawal seizure.  Patient is alert and nontoxic.  Mental status is clear.  Respirations nonlabored.  Heart is regular.  Speech is clear.  No signs of confusion or hallucination.  No significant tremor.  This time, no signs of delirium or severe withdrawal.  Will contact peers support for the patient to try to find inpatient rehab facility.  Patient stable for as needed treatment of withdrawal symptoms and medically clear for treatment program at this time or discharge with resources if no inpatient program available at this time.Marland Kitchen   Charlesetta Shanks, MD 10/01/19 [phone]

## 2019-09-20 NOTE — ED Provider Notes (Signed)
Accepted handoff at shift change from [facility]. Please see prior provider note for more detail.   Briefly: Patient is 69 y.o.   Plan: Patient is already had consultation for peer support plan is to hold patient until he is evaluated by them.   Physical Exam  BP (!) 192/91 (BP Location: Right Arm)   Pulse (!) 111   Temp 98.1 F (36.7 C) (Oral)   Resp (!) 29   Ht 5' 6"  (1.676 m)   Wt 86.2 kg   SpO2 96%   BMI 30.67 kg/m   CONSTITUTIONAL:  well-appearing, NAD NEURO:  Alert and oriented x 3, no focal deficits EYES:  pupils equal and reactive ENT/NECK:  trachea midline, no JVD CARDIO:   Tachycardic heart rate 106, reg rhythm, well-perfused PULM:  None labored breathing GI/GU:  Abdomen non-distended MSK/SPINE:  No gross deformities, no edema SKIN:  no rash obvious, atraumatic, no ecchymosis  PSYCH:  Appropriate speech and behavior  I estimate patient CIWA score to be 6.  Nursing staff notably estimated his CIWA score to be 13.  He has had episodes of tachycardia.  However he is denying nausea, anxiety, diaphoresis.  No headaches or palpitations per him.  ED Course/Procedures     Results for orders placed or performed during the hospital encounter of 09/20/19  SARS Coronavirus 2 by RT PCR (hospital order, performed in [facility] hospital lab) Nasopharyngeal Nasopharyngeal Swab   Specimen: Nasopharyngeal Swab  Result Value Ref Range   SARS Coronavirus 2 NEGATIVE NEGATIVE  Comprehensive metabolic panel  Result Value Ref Range   Sodium 140 135 - 145 mmol/L   Potassium 3.8 3.5 - 5.1 mmol/L   Chloride 104 98 - 111 mmol/L   CO2 17 (L) 22 - 32 mmol/L   Glucose, Bld 83 70 - 99 mg/dL   BUN 12 8 - 23 mg/dL   Creatinine, Ser 0.87 0.61 - 1.24 mg/dL   Calcium 9.0 8.9 - 10.3 mg/dL   Total Protein 7.5 6.5 - 8.1 g/dL   Albumin 3.6 3.5 - 5.0 g/dL   AST 30 15 - 41 U/L   ALT 25 0 - 44 U/L   Alkaline Phosphatase 99 38 - 126 U/L   Total Bilirubin 0.9 0.3 - 1.2 mg/dL   GFR calc non  Af Amer >60 >60 mL/min   GFR calc Af Amer >60 >60 mL/min   Anion gap 19 (H) 5 - 15  Ethanol  Result Value Ref Range   Alcohol, Ethyl (B) 179 (H) <10 mg/dL  cbc  Result Value Ref Range   WBC 7.0 4.0 - 10.5 K/uL   RBC 4.45 4.22 - 5.81 MIL/uL   Hemoglobin 14.3 13.0 - 17.0 g/dL   HCT 42.2 39.0 - 52.0 %   MCV 94.8 80.0 - 100.0 fL   MCH 32.1 26.0 - 34.0 pg   MCHC 33.9 30.0 - 36.0 g/dL   RDW 13.3 11.5 - 15.5 %   Platelets 323 150 - 400 K/uL   nRBC 0.0 0.0 - 0.2 %  Ethanol  Result Value Ref Range   Alcohol, Ethyl (B) 10 (H) <10 mg/dL  POC occult blood, ED  Result Value Ref Range   Fecal Occult Bld POSITIVE (A) NEGATIVE   No results found.   MDM   Patient like to be discharged at this time.  He has been seen by peers support and per nursing staff  "Pt met with peer support via phone. Pt refused assistance in getting into a  rehab and says he'll go on his own tomorrow. Pt does have peer support's # for any future needs with a "call anytime" offer"  I provide patient with Librium taper.  I recommended/offered patient admission because of his elevated CIWA score however he states that he prefers to be discharged at this time.  He is comfortable leaving AMA.  Patient wishes to leave [place]. I personally explained need for further testing and my concerns for adverse outcomes if workup is incomplete. Specific concerns explained to patient include worsening symptoms, functional loss, long term sickness and death. Patient states understanding of risks and states they will return if they feel the need to at a later date to receive the recommended care or any other care at any time, regardless of their ability to pay for such care. Patient understands they are able to return at any time. Patient is able to explain back the risks of leaving AMA and still wishes to leave.   Specifically I recommend admission due to elevated CIWA score  The patient is oriented to person, place, and  time, has the capacity to make decisions regarding the medical care offered. The patient speaks coherently and exhibits no evidence of having an altered level of consciousness or alcohol or drug intoxication to a point that would impair judgment. They respond knowingly to questions about recommended treatment and alternate treatments including no further testing or treatment; participate in diagnostic and treatment decisions by means of rational thought processes; and understand the items of minimum basic medical treatment information with respect to that treatment (the nature and seriousness of the illness, the nature of the treatment, the probable degree and duration of any benefits and risks of any medical intervention that is being recommended, and the consequences of lack of treatment, and the nature, risks, and benefits of any reasonable alternatives).  The patient understands the relevant information of the nature of their medical condition, as well as the risks, benefits, and treatment alternatives (including non-treatment), consequences of refusing care, and can competently communicate a rational explanation about their choice of care options.    Included in AVS was the following message:  You have chosen to leave [place]. Should you change your mind, you are always welcome and encouraged to return to the ED. You are encouraged to follow-up with, at the very least, a primary care provider, or other similar medical professional on this matter.      Pati Gallo [place], [place] 09/20/19 1905    Malvin Johns, MD 09/20/19 2325

## 2019-09-20 NOTE — ED Notes (Signed)
Pt also states that he is having dark tarry stools. Hx of varices-- states he drinks "all day every day" hands already sweaty, becoming anxious, has been in withdrawal in past, concerned about repeat.

## 2019-09-20 NOTE — Discharge Planning (Signed)
RNCM met with pt at bedside regarding peer support referral.  Pt states he has been in [facility] 3 times, and may return to a place like that again.  Pt would like to "think about it."

## 2019-09-20 NOTE — Discharge Instructions (Addendum)
You have chosen to leave AGAINST MEDICAL ADVICE. Should you change your mind, you are always welcome and encouraged to return to the ED. You are encouraged to follow-up with, at the very least, a primary care provider, or other similar medical professional on this matter.  

## 2019-09-20 NOTE — ED Notes (Signed)
Patient has chosen to leave AMA. EDP at bedside to speak with patient regarding request to leave. E-signature not working, patient has verbalized understanding risk of leaving. Verbalized understanding of Rx. Patient brother to pick him up.

## 2019-09-20 NOTE — Social Work (Signed)
CSW met with Pt at bedside and carried phone with her to have Lucas from [place] via speakerphone.   Pt stated that he plans to go to [facility] on his own in person tomorrow. Pt did take Brian's phone # for any future needs.

## 2019-09-20 NOTE — ED Triage Notes (Signed)
Pt to ED via [facility] for detox from ETOH. States he drinks approx a gallon of vodka a day. He called 911 for help. Denies SI/HI. Pt states he has to drink 'all the time'. Calm and cooperative. Tearful at times.

## 2019-09-20 NOTE — ED Provider Notes (Cosign Needed)
[facility] EMERGENCY DEPARTMENT Provider Note   CSN: [id number] Arrival date & time: 09/20/19  1006     History Chief Complaint  Patient presents with  . detox    [name] [name] is a 69 y.o. male with history of ETOH abuse, CHF, cirrhosis, esophageal varices who presents with alcohol withdrawal. He states that he is an alcoholic and he drinks 1 gallon to 1.5 gallons of vodka daily. He states that it's gotten to the point where "I just can't get enough". He feels like he is withdrawing from alcohol because it's not causing any effect. His last ETOH intake was 4AM. He reports feeling sweaty and anxious. He has daily N/V which is black. He also reports dark stools which has been going on for week-months. He denies lightheadedness/syncope, chest pain, SOB, abdominal pain. No hx of alcohol withdrawal seizure or DTs. He states he's been to [facility] multiple times in the past. The longest time he was sober was 4 years. No SI/HI or AVH.  HPI     Past Medical History:  Diagnosis Date  . Alcohol abuse   . Anemia   . Arthritis   . CHF (congestive heart failure) ([place])   . Cirrhosis of liver ([place])   . Cirrhosis, alcoholic ([place])    last etoh 3/13-sees dr Henrene Pastor  . Colon polyps   . Esophageal varices ([place])   . GERD (gastroesophageal reflux disease)   . H/O sleep apnea   . Hyperlipidemia   . Hypertension   . Sleep apnea    had surgery to correct snoring 2001  . Vocal cord paralysis    left    Patient Active Problem List   Diagnosis Date Noted  . Atypical chest pain 06/30/2018  . GERD (gastroesophageal reflux disease) 06/30/2018  . Hypertensive urgency 06/30/2018  . Trimalleolar fracture of left ankle 03/08/2014  . Dysphagia, oropharyngeal phase 03/08/2014  . 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 Acute delirium 02/24/2014  . Blunt chest trauma 02/09/2014  . Fracture of lumbar spine ([place])  02/09/2014  . Multiple rib fractures involving four or more ribs 02/09/2014  . Traumatic pneumothorax 02/09/2014  . 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 History of osteopenia 10/13/2012  . Compression fracture of L1 lumbar vertebra ([place]) 10/13/2012  . Chest pain 10/13/2012  . Alcohol abuse 10/13/2012  . Esophageal varices ([place]) 01/17/2012  . History of colonic polyps 10/29/2011  . Cirrhosis ([place]) 10/29/2011  . Nonspecific abnormal finding in stool contents 07/26/2011  . Lower extremity weakness 07/21/2011  . Hyponatremia 07/21/2011  . Anemia 07/21/2011  . Abnormal LFTs 07/21/2011  . Systolic CHF, chronic ([place]) 07/21/2011  . Moderate protein-calorie malnutrition ([place]) 07/21/2011    Past Surgical History:  Procedure Laterality Date  . APPENDECTOMY    . COLONOSCOPY  2013  . HERNIA REPAIR    . INSERTION OF MESH  04/21/2012   Procedure: INSERTION OF MESH;  Surgeon: Harl Bowie, MD;  Location: [facility];  Service: General;  Laterality: N/A;  . MOUTH SURGERY  7/13  . ORIF ANKLE FRACTURE Left 02/12/2014   Procedure: OPEN REDUCTION INTERNAL FIXATION (ORIF) LEFT ANKLE FRACTURE ;  Surgeon: Wylene Simmer, MD;  Location: [facility];  Service: Orthopedics;  Laterality: Left;  . PERCUTANEOUS PINNING Left 02/12/2014   Procedure: Closed Reduction  PERCUTANEOUS PINNING left great toe;  Surgeon: Wylene Simmer, MD;  Location: [facility];  Service: Orthopedics;  Laterality: Left;  . ROTATOR CUFF REPAIR Bilateral   . SHOULDER OPEN ROTATOR CUFF REPAIR     2 on right shoulder, 1 on left shoulder  . THROAT SURGERY     surgery to help with snoring  . UMBILICAL HERNIA REPAIR  04/21/2012   Procedure: HERNIA REPAIR UMBILICAL ADULT;  Surgeon: Harl Bowie, MD;  Location: [facility];  Service: General;  Laterality: N/A;  umbilical hernia repair with mesh  . UMBILICAL HERNIA REPAIR    . UPPER GI ENDOSCOPY  2013  . UVULOPALATOPHARYNGOPLASTY  2001  .  UVULOPALATOPHARYNGOPLASTY         Family History  Problem Relation Age of Onset  . Diabetes Mother   . Congestive Heart Failure Mother   . Heart disease Father        first MI at age 30, stents, CABG  . Diabetes Father   . Atrial fibrillation Brother   . CAD Brother        MI and stent age 77  . Hypertension Brother   . Colon cancer Neg Hx   . Stomach cancer Neg Hx   . Rectal cancer Neg Hx     Social History   Tobacco Use  . Smoking status: Never Smoker  . Smokeless tobacco: Never Used  Substance Use Topics  . Alcohol use: No    Comment: per patient has not drank since 2015  . Drug use: Yes    Types: Marijuana    Comment: occasionally per patient     Home Medications Prior to Admission medications   Medication Sig Start Date End Date Taking? Authorizing Provider  acetaminophen (TYLENOL) 325 MG tablet Take 1-2 tablets (325-650 mg total) by mouth every 6 (six) hours as needed for mild pain. 03/15/14   Love, Ivan Anchors, PA-C  docusate (COLACE) 50 MG/5ML liquid Take 10 mLs (100 mg total) by mouth 2 (two) times daily. As stool softner Patient not taking: Reported on 06/30/2018 03/15/14   Love, Ivan Anchors, PA-C  furosemide (LASIX) 40 MG tablet Take 1 tablet (40 mg total) by mouth daily for 30 days. 07/02/18 08/01/18  Elodia Florence., MD  losartan (COZAAR) 50 MG tablet Take 1 tablet (50 mg total) by mouth daily for 30 days. 07/03/18 08/02/18  Elodia Florence., MD  Multiple Vitamin (MULITIVITAMIN WITH MINERALS) TABS Take 1 tablet by mouth daily. Patient not taking: Reported on 06/30/2018 07/27/11   Verlee Monte, MD  omeprazole (PRILOSEC) 20 MG capsule Take 2 capsules (40 mg total) by mouth daily. Patient not taking: Reported on 06/30/2018 10/14/12   Jerrye Noble, MD  propranolol (INDERAL) 40 MG tablet Take 1 tablet (40 mg total) by mouth 3 (three) times daily for 30 days. 07/02/18 08/01/18  Elodia Florence., MD  senna (SENOKOT) 8.6 MG TABS tablet Take 2 tablets (17.2 mg  total) by mouth daily. Patient not taking: Reported on 06/30/2018 03/15/14   Love, Ivan Anchors, PA-C  spironolactone (ALDACTONE) 25 MG tablet Take 0.5 tablets (12.5 mg total) by mouth daily for 30 days. 07/03/18 08/02/18  Elodia Florence., MD    Allergies    Adhesive [tape]; Amlodipine; Amoxicillin; Atenolol; Celebrex [celecoxib]; Cephalosporins; Clindamycin/lincomycin; Dexon [dexamethasone sodium phosphate]; Doxazosin mesylate; Duraprep [antiseptic products, misc.]; Enalapril; Erythromycin; Hctz [hydrochlorothiazide]; Iodine; Lipitor [atorvastatin]; Other; Toprol xl [metoprolol succinate]; Verapamil; Vioxx [rofecoxib]; Zocor [simvastatin]; and Latex  Review of Systems   Review of Systems  Constitutional: Positive for diaphoresis. Negative for chills and fever.  Respiratory: Negative for shortness of breath.   Cardiovascular: Negative for chest pain.  Gastrointestinal: Positive for nausea and vomiting. Negative for abdominal pain and diarrhea.  Neurological: Positive for tremors.  Psychiatric/Behavioral: Positive for dysphoric mood. The patient is nervous/anxious.   All other systems reviewed and are negative.   Physical Exam Updated Vital Signs BP (!) 176/93   Pulse 98   Temp 97.8 F (36.6 C) (Oral)   Resp 18   Ht 5\' 6"  (1.676 m)   Wt 86.2 kg   SpO2 100%   BMI 30.67 kg/m   Physical Exam Vitals and nursing note reviewed.  Constitutional:      General: He is not in acute distress.    Appearance: Normal appearance. He is well-developed. He is diaphoretic. He is not ill-appearing.     Comments: Anxious, cooperative. Tearful at times  HENT:     Head: Normocephalic and atraumatic.  Eyes:     General: No scleral icterus.       Right eye: No discharge.        Left eye: No discharge.     Conjunctiva/sclera: Conjunctivae normal.     Pupils: Pupils are equal, round, and reactive to light.  Cardiovascular:     Rate and Rhythm: Normal rate and regular rhythm.  Pulmonary:      Effort: Pulmonary effort is normal. No respiratory distress.     Breath sounds: Normal breath sounds.  Abdominal:     General: There is no distension.     Palpations: Abdomen is soft.     Tenderness: There is no abdominal tenderness.  Genitourinary:    Comments: Rectal: No melena or gross blood noted Musculoskeletal:     Cervical back: Normal range of motion.  Skin:    General: Skin is warm.  Neurological:     Mental Status: He is alert and oriented to person, place, and time.  Psychiatric:        Mood and Affect: Mood is anxious.        Behavior: Behavior normal.     ED Results / Procedures / Treatments   Labs (all labs ordered are listed, but only abnormal results are displayed) Labs Reviewed  COMPREHENSIVE METABOLIC PANEL - Abnormal; Notable for the following components:      Result Value   CO2 17 (*)    Anion gap 19 (*)    All other components within normal limits  ETHANOL - Abnormal; Notable for the following components:   Alcohol, Ethyl (B) 179 (*)    All other components within normal limits  POC OCCULT BLOOD, ED - Abnormal; Notable for the following components:   Fecal Occult Bld POSITIVE (*)    All other components within normal limits  CBC  RAPID URINE DRUG SCREEN, HOSP PERFORMED    EKG None  Radiology No results found.  Procedures Procedures (including critical care time)  Medications Ordered in ED Medications  LORazepam (ATIVAN) injection 0-4 mg (2 mg Intravenous Given 09/20/19 1206)    Or  LORazepam (ATIVAN) tablet 0-4 mg ( Oral See Alternative 09/20/19 1206)  LORazepam (ATIVAN) injection 0-4 mg (1 mg Intravenous Given 09/20/19 1421)    Or  LORazepam (ATIVAN) tablet 0-4 mg ( Oral See Alternative 09/20/19 1421)  thiamine tablet 100 mg ( Oral See Alternative 09/20/19 1206)    Or  thiamine (B-1) injection 100 mg (100 mg Intravenous Given 09/20/19 1206)  sodium chloride 0.9 % bolus 1,000 mL (0 mLs Intravenous Stopped 09/20/19 1257)  ondansetron (ZOFRAN)  injection 4 mg (4 mg Intravenous Given 09/20/19 1205)    ED Course  I have reviewed the triage vital signs and the nursing notes.  Pertinent labs & imaging results that were available during my care of the patient were reviewed by me and considered in my medical decision making (see chart for details).  69 year old male with chronic alcoholism presents with concern for alcohol withdrawal.  Blood pressure is elevated but otherwise vital signs are reassuring.  He is mildly diet paretic and tremulous on exam.  He is alert and oriented and does not appear clinically intoxicated.  Abdomen is soft and nontender.  Rectal exam was performed due to reported history of black stools and there was no melena or gross blood noted.  Initial CIWA score was 12. CIWA protocol ordered along with fluids and zofran. Patient was given Ativan.  Labs obtained are overall reassuring.  He has a mildly low bicarb and elevated anion gap which is likely due to alcohol.  EtOH is elevated at 179.  Patient has no history of alcohol withdrawal seizures or DTs.  He is not suicidal, homicidal or hallucinating. Shared visit with Dr. Johnney Killian. No indication for admission currently. Will contact TOC and peer support.  2:37 PM Peer support was contacted. They are going to try and assist pt to be admitted to inpatient rehab. Will order COVID test.   Care signed out to Sherron Monday PA-C pending peer support consult.    MDM Rules/Calculators/A&P                       Final Clinical Impression(s) / ED Diagnoses Final diagnoses:  Alcohol abuse    Rx / DC Orders ED Discharge Orders    None       Recardo Evangelist, PA-C 09/20/19 1550

## 2020-12-23 ENCOUNTER — Other Ambulatory Visit: Payer: Self-pay | Admitting: Family Medicine

## 2020-12-23 DIAGNOSIS — N5089 Other specified disorders of the male genital organs: Secondary | ICD-10-CM

## 2020-12-29 ENCOUNTER — Inpatient Hospital Stay: Admission: RE | Admit: 2020-12-29 | Payer: Medicare Other | Source: Ambulatory Visit

## 2020-12-30 ENCOUNTER — Ambulatory Visit
Admission: RE | Admit: 2020-12-30 | Discharge: 2020-12-30 | Disposition: A | Discharge status: Home / Self Care | Payer: Medicare Other | Source: Ambulatory Visit | Attending: Family Medicine | Admitting: Family Medicine

## 2020-12-30 DIAGNOSIS — N5089 Other specified disorders of the male genital organs: Secondary | ICD-10-CM

## 2021-08-20 DIAGNOSIS — E785 Hyperlipidemia, unspecified: Secondary | ICD-10-CM | POA: Diagnosis not present

## 2021-08-20 DIAGNOSIS — I10 Essential (primary) hypertension: Secondary | ICD-10-CM | POA: Diagnosis not present

## 2021-08-20 DIAGNOSIS — M25569 Pain in unspecified knee: Secondary | ICD-10-CM | POA: Diagnosis not present

## 2021-08-20 DIAGNOSIS — M2550 Pain in unspecified joint: Secondary | ICD-10-CM | POA: Diagnosis not present

## 2021-09-14 DIAGNOSIS — R7301 Impaired fasting glucose: Secondary | ICD-10-CM | POA: Diagnosis not present

## 2022-06-09 DIAGNOSIS — M170 Bilateral primary osteoarthritis of knee: Secondary | ICD-10-CM | POA: Diagnosis not present

## 2022-06-09 DIAGNOSIS — M1712 Unilateral primary osteoarthritis, left knee: Secondary | ICD-10-CM | POA: Diagnosis not present

## 2022-06-09 DIAGNOSIS — M1711 Unilateral primary osteoarthritis, right knee: Secondary | ICD-10-CM | POA: Diagnosis not present

## 2022-06-29 DIAGNOSIS — M170 Bilateral primary osteoarthritis of knee: Secondary | ICD-10-CM | POA: Diagnosis not present

## 2022-07-02 DIAGNOSIS — M170 Bilateral primary osteoarthritis of knee: Secondary | ICD-10-CM | POA: Diagnosis not present

## 2022-07-07 DIAGNOSIS — Z01810 Encounter for preprocedural cardiovascular examination: Secondary | ICD-10-CM | POA: Diagnosis not present

## 2022-07-07 DIAGNOSIS — Z01818 Encounter for other preprocedural examination: Secondary | ICD-10-CM | POA: Diagnosis not present

## 2022-07-07 DIAGNOSIS — I10 Essential (primary) hypertension: Secondary | ICD-10-CM | POA: Diagnosis not present

## 2022-07-15 ENCOUNTER — Telehealth: Payer: Self-pay | Admitting: *Deleted

## 2022-07-15 NOTE — Telephone Encounter (Signed)
   Pre-operative Risk Assessment    Patient Name: REFAEL FULOP  DOB: Dec 15, 1950 MRN: [id number]      Request for Surgical Clearance    Procedure:   Left Total Knee  Arthroplasty  Date of Surgery:  Clearance TBD                                 Surgeon:  Dr. Charlies Constable Surgeon's Group or Practice Name:  [facility] Phone number:  [phone] Fax number:  [phone]   Type of Clearance Requested:   - Medical    Type of Anesthesia:  Spinal   Additional requests/questions:    Signed, Greer Ee   07/15/2022, 1:17 PM

## 2022-07-16 NOTE — Telephone Encounter (Signed)
I will send notes to surgeon's office as they will need to send over a referral to cardiology, as the pt will need to re-est since we have not seen him since 2011. Once we have a referral in place we can go ahead and get a new pt appt. I will update our chart prep and scheduling team.

## 2022-07-19 NOTE — Telephone Encounter (Signed)
Was able to leave a message for pt on [phone]. Left message to call back to schedule a NEW PT APPT IN OFFICE for pre op clearance.

## 2022-07-19 NOTE — Telephone Encounter (Signed)
Marcy Salvo, CMA Arbie Cookey looks like Denny Peon is working on this referral.   She has called and left a message for patient to call back for an appointment.

## 2022-07-20 NOTE — Telephone Encounter (Signed)
Pt has new pt appt 08/02/22 with Dr. Gasper Sells. I will update all parties.

## 2022-08-02 ENCOUNTER — Encounter: Payer: Self-pay | Admitting: Internal Medicine

## 2022-08-02 ENCOUNTER — Ambulatory Visit: Payer: Medicare Other | Attending: Internal Medicine | Admitting: Internal Medicine

## 2022-08-02 VITALS — BP 200/80 | HR 59 | Ht 67.0 in | Wt 148.0 lb

## 2022-08-02 DIAGNOSIS — R011 Cardiac murmur, unspecified: Secondary | ICD-10-CM

## 2022-08-02 DIAGNOSIS — I10 Essential (primary) hypertension: Secondary | ICD-10-CM

## 2022-08-02 DIAGNOSIS — I509 Heart failure, unspecified: Secondary | ICD-10-CM | POA: Diagnosis not present

## 2022-08-02 DIAGNOSIS — I160 Hypertensive urgency: Secondary | ICD-10-CM

## 2022-08-02 DIAGNOSIS — I517 Cardiomegaly: Secondary | ICD-10-CM

## 2022-08-02 DIAGNOSIS — Z01818 Encounter for other preprocedural examination: Secondary | ICD-10-CM

## 2022-08-02 DIAGNOSIS — I119 Hypertensive heart disease without heart failure: Secondary | ICD-10-CM | POA: Insufficient documentation

## 2022-08-02 MED ORDER — LOSARTAN POTASSIUM 50 MG PO TABS: 50.0000 mg | ORAL_TABLET | Freq: Every day | ORAL | 3 refills | Status: DC

## 2022-08-02 NOTE — Patient Instructions (Addendum)
Medication Instructions:  Your physician has recommended you make the following change in your medication:  RESTART: Losartan 50 mg by mouth once daily  PLEASE: monitor your Blood pressure  *If you need a refill on your cardiac medications before your next appointment, please call your pharmacy*   Lab Work: IN 7-10 DAYS:BMP  If you have labs (blood work) drawn today and your tests are completely normal, you will receive your results only by: [facility] (if you have MyChart) OR A paper copy in the mail If you have any lab test that is abnormal or we need to change your treatment, we will call you to review the results.   Testing/Procedures: Your physician has requested that you have an echocardiogram. Echocardiography is a painless test that uses sound waves to create images of your heart. It provides your doctor with information about the size and shape of your heart and how well your heart's chambers and valves are working. This procedure takes approximately one hour. There are no restrictions for this procedure. Please do NOT wear cologne, perfume, aftershave, or lotions (deodorant is allowed). Please arrive 15 minutes prior to your appointment time.   Your physician has requested that you have en exercise stress myoview. For further information please visit [email or web address]. Please follow instruction sheet, as given.    You are scheduled for a Myocardial Perfusion Imaging Study. Please arrive 15 minutes prior to your appointment time for registration and insurance purposes.   The test will take approximately 3 to 4 hours to complete; you may bring reading material.  If someone comes with you to your appointment, they will need to remain in the main lobby due to limited space in the testing area.    How to prepare for your Myocardial Perfusion Test: Do not eat or drink 3 hours prior to your test, except you may have water. Do not consume products containing caffeine  (regular or decaffeinated) 12 hours prior to your test. (ex: coffee, chocolate, sodas, tea). Do bring a list of your current medications with you.  If not listed below, you may take your medications as normal. Do wear comfortable clothes (no dresses or overalls) and walking shoes, tennis shoes preferred (No heels or open toe shoes are allowed). Do NOT wear cologne, perfume, aftershave, or lotions (deodorant is allowed). If these instructions are not followed, your test will have to be rescheduled.  If you cannot keep your appointment, please provide 24 hours notification to the Nuclear Lab, to avoid a possible $50 charge to your account.       Follow-Up: At [facility], you and your health needs are our priority.  As part of our continuing mission to provide you with exceptional heart care, we have created designated Provider Care Teams.  These Care Teams include your primary Cardiologist (physician) and Advanced Practice Providers (APPs -  Physician Assistants and Nurse Practitioners) who all work together to provide you with the care you need, when you need it.  We recommend signing up for the patient portal called "MyChart".  Sign up information is provided on this After Visit Summary.  MyChart is used to connect with patients for Virtual Visits (Telemedicine).  Patients are able to view lab/test results, encounter notes, upcoming appointments, etc.  Non-urgent messages can be sent to your provider as well.   To learn more about what you can do with MyChart, go to [email or web address].    Your next appointment:   2 month(s)  Provider:   Johann Capers  [place], [place]

## 2022-08-02 NOTE — Progress Notes (Signed)
Cardiology Office Note:    Date:  08/02/2022   ID:  [name] [name], DOB 1951-04-30, MRN [id number]  PCP:  Leonard Downing, [place] [facility] Cardiologist:  Werner Lean, MD     Referring MD: Leonard Downing, *   CC: Pre-operative risk  Consulted for the evaluation of preoperative risk stratification; Total Knee with Spinal anesthesia with Dawayne Cirri at the behest of Dr. Orrin Brigham  History of Present Illness:    [name] [name] is a 72 y.o. male with a hx of HF with prior systolic dysfunction with recovered LVEF [id number], former alcoholic cirrhosis with varices, HTN, HLD, and OSA prior to ENT surgery in 2021.  Patient notes that he is feeling great.   Hasn't seen a cardiologist since ~ 2015.   He only takes two medications: Zetia and propranolol  Has had no chest pain, chest pressure, chest tightness, chest stinging. Neck pain that resolves with turning his neck a different way No shortness of breath, DOE.  No PND or orthopnea.  No weight gain, leg swelling , or abdominal swelling.  No syncope or near syncope . Notes  no palpitations or funny heart beats.    Both brothers have had MI's.Marland Kitchen   He is sedentary.   He lost wife in 2016; he had planned to travel the country after retirement but his wife had cancer.   Ambulatory SBP 230  on average   Past Medical History:  Diagnosis Date   Alcohol abuse    Anemia    Arthritis    CHF (congestive heart failure)    Cirrhosis of liver    Cirrhosis, alcoholic    last etoh 0000000 dr Henrene Pastor   Colon polyps    Esophageal varices    GERD (gastroesophageal reflux disease)    H/O sleep apnea    Hyperlipidemia    Hypertension    Sleep apnea    had surgery to correct snoring 2001   Vocal cord paralysis    left    Past Surgical History:  Procedure Laterality Date   APPENDECTOMY     COLONOSCOPY  2013   HERNIA REPAIR     INSERTION OF MESH  04/21/2012   Procedure: INSERTION OF MESH;  Surgeon:  Harl Bowie, MD;  Location: [facility];  Service: General;  Laterality: N/A;   MOUTH SURGERY  7/13   ORIF ANKLE FRACTURE Left 02/12/2014   Procedure: OPEN REDUCTION INTERNAL FIXATION (ORIF) LEFT ANKLE FRACTURE ;  Surgeon: Wylene Simmer, MD;  Location: [facility];  Service: Orthopedics;  Laterality: Left;   PERCUTANEOUS PINNING Left 02/12/2014   Procedure: Closed Reduction  PERCUTANEOUS PINNING left great toe;  Surgeon: Wylene Simmer, MD;  [facility];  Service: Orthopedics;  Laterality: Left;   ROTATOR CUFF REPAIR Bilateral    SHOULDER OPEN ROTATOR CUFF REPAIR     2 on right shoulder, 1 on left shoulder   THROAT SURGERY     surgery to help with snoring   UMBILICAL HERNIA REPAIR  04/21/2012   Procedure: HERNIA REPAIR UMBILICAL ADULT;  Surgeon: Harl Bowie, MD;  Location: [facility];  Service: General;  Laterality: N/A;  umbilical hernia repair with mesh   UMBILICAL HERNIA REPAIR     UPPER GI ENDOSCOPY  2013   UVULOPALATOPHARYNGOPLASTY  2001   UVULOPALATOPHARYNGOPLASTY      Current Medications: Current Meds  Medication Sig   acetaminophen (TYLENOL) 325 MG tablet Take 1-2 tablets (325-650 mg  total) by mouth every 6 (six) hours as needed for mild pain.   docusate (COLACE) 50 MG/5ML liquid Take 10 mLs (100 mg total) by mouth 2 (two) times daily. As stool softner (Patient taking differently: Take 100 mg by mouth as needed for moderate constipation. As stool softner)   ezetimibe (ZETIA) 10 MG tablet Take 10 mg by mouth daily.   Multiple Vitamin (MULITIVITAMIN WITH MINERALS) TABS Take 1 tablet by mouth daily.   propranolol (INDERAL) 40 MG tablet Take 1 tablet (40 mg total) by mouth 3 (three) times daily for 30 days. (Patient taking differently: Take 40 mg by mouth daily.)   [DISCONTINUED] furosemide (LASIX) 40 MG tablet Take 1 tablet (40 mg total) by mouth daily for 30 days.   [DISCONTINUED] losartan (COZAAR) 50 MG tablet Take 1 tablet (50 mg total) by mouth  daily for 30 days.     Allergies:   Adhesive [tape]; Amlodipine; Amoxicillin; Atenolol; Celebrex [celecoxib]; Cephalosporins; Clindamycin/lincomycin; Dexon [dexamethasone sodium phosphate]; Doxazosin mesylate; Duraprep [antiseptic products, misc.]; Enalapril; Erythromycin; Hctz [hydrochlorothiazide]; Iodine; Lipitor [atorvastatin]; Other; Toprol xl [metoprolol succinate]; Verapamil; Vioxx [rofecoxib]; Zocor [simvastatin]; and Latex   Social History   Socioeconomic History   Marital status: Widowed    Spouse name: Not on file   Number of children: Not on file   Years of education: Not on file   Highest education level: Not on file  Occupational History   Not on file  Tobacco Use   Smoking status: Never   Smokeless tobacco: Never  Vaping Use   Vaping Use: Never used  Substance and Sexual Activity   Alcohol use: No    Comment: per patient has not drank since 2015   Drug use: Yes    Types: Marijuana    Comment: occasionally per patient    Sexual activity: Not on file  Other Topics Concern   Not on file  Social History Narrative   ** Merged History Encounter **       Social Determinants of Health   Financial Resource Strain: Not on file  Food Insecurity: Not on file  Transportation Needs: Not on file  Physical Activity: Not on file  Stress: Not on file  Social Connections: Not on file     Family History: The patient's family history includes Atrial fibrillation in his brother; CAD in his brother; Congestive Heart Failure in his mother; Diabetes in his father and mother; Heart disease in his father; Hypertension in his brother. There is no history of Colon cancer, Stomach cancer, or Rectal cancer.  ROS:   Please see the history of present illness.     All other systems reviewed and are negative.  EKGs/Labs/Other Studies Reviewed:    The following studies were reviewed today:   EKG:  EKG is  ordered today.  The ekg ordered today demonstrates  08/02/22: Sinus bradycardia  with LVH  Cardiac Studies & Procedures     STRESS TESTS  NM MYOCAR MULTI W/SPECT W 07/01/2018  Narrative  There was no ST segment deviation noted during stress.  There is a medium defect of moderate severity present in the basal inferoseptal, mid inferoseptal and apical lateral location. The defect is non-reversible and consistent with diaphragmatic attenuation artifact.  There is a small defect of mild severity present in the apical inferior location. The defect is reversible and small and consistent with a small area of ischemia vs. Variations in Diaphragmatic attenuation artifact.  The left ventricular ejection fraction is normal (55-65%).  This is a  low risk study.   ECHOCARDIOGRAM  ECHOCARDIOGRAM COMPLETE 07/01/2018  Narrative ECHOCARDIOGRAM REPORT    Patient Name:   [name] [name] Date of Exam: 07/01/2018 Medical Rec #:  [id number]     Height:       67.0 in Accession #:    [id number]    Weight:       174.4 lb Date of Birth:  13-Nov-1950     BSA:          1.91 m Patient Age:    46 years      BP:           157/76 mmHg Patient Gender: M             HR:           63 bpm. Exam Location:  Inpatient   Procedure: 2D Echo  Indications:    Chest pain 786.50  History:        Patient has prior history of Echocardiogram examinations, most recent 07/23/2011. CHF; Risk Factors: Hypertension.  Sonographer:    Johny Chess Referring Phys: [id number] [place]   1. The left ventricle has normal systolic function with an ejection fraction of 60-65%. The cavity size was normal. Left ventricular diastolic Doppler parameters are consistent with impaired relaxation. 2. The right ventricle has normal systolic function. The cavity was normal. There is no increase in right ventricular wall thickness. 3. The mitral valve is degenerative. There is moderate mitral annular calcification present. 4. The tricuspid valve is normal in structure. 5. The aortic valve is  tricuspid Moderate thickening of the aortic valve Moderate calcification of the aortic valve. 6. The pulmonic valve was normal in structure.  FINDINGS Left Ventricle: The left ventricle has normal systolic function, with an ejection fraction of 60-65%. The cavity size was normal. There is no increase in left ventricular wall thickness. Left ventricular diastolic Doppler parameters are consistent with impaired relaxation Right Ventricle: The right ventricle has normal systolic function. The cavity was normal. There is no increase in right ventricular wall thickness. Left Atrium: left atrial size was normal in size Right Atrium: right atrial size was normal in size. Right atrial pressure is estimated at 3 mmHg. Interatrial Septum: No atrial level shunt detected by color flow Doppler. Pericardium: There is no evidence of pericardial effusion. Mitral Valve: The mitral valve is degenerative in appearance. There is moderate mitral annular calcification present. Mitral valve regurgitation is not visualized by color flow Doppler. Tricuspid Valve: The tricuspid valve is normal in structure. Tricuspid valve regurgitation was not visualized by color flow Doppler. Aortic Valve: The aortic valve is tricuspid Moderate thickening of the aortic valve Moderate calcification of the aortic valve. Aortic valve regurgitation was not visualized by color flow Doppler. Pulmonic Valve: The pulmonic valve was normal in structure. Pulmonic valve regurgitation is trivial by color flow Doppler. Venous: The inferior vena cava is normal in size with greater than 50% respiratory variability.  LEFT VENTRICLE PLAX 2D (Teich) LV EF:          76.6 %   Diastology LVIDd:          3.60 cm  LV e' lateral:   8.70 cm/s LVIDs:          2.00 cm  LV E/e' lateral: 7.1 LV PW:          1.20 cm  LV e' medial:    4.79 cm/s LV IVS:         1.10 cm  LV E/e' medial:  12.9 LVOT diam:      1.90 cm LV SV:          42 ml LVOT Area:      2.84  cm  RIGHT VENTRICLE RV S prime:     12.70 cm/s TAPSE (M-mode): 1.8 cm  LEFT ATRIUM             Index       RIGHT ATRIUM           Index LA diam:        4.00 cm 2.10 cm/m  RA Pressure: 3 mmHg LA Vol (A2C):   63.8 ml 33.44 ml/m RA Area:     12.90 cm LA Vol (A4C):   60.6 ml 31.76 ml/m RA Volume:   29.30 ml  15.36 ml/m LA Biplane Vol: 64.1 ml 33.60 ml/m AORTIC VALVE AV Area (Vmax):    1.61 cm AV Area (Vmean):   1.60 cm AV Area (VTI):     1.93 cm AV Vmax:           238.00 cm/s AV Vmean:          160.000 cm/s AV VTI:            0.431 m AV Peak Grad:      22.7 mmHg AV Mean Grad:      11.0 mmHg LVOT Vmax:         135.00 cm/s LVOT Vmean:        90.300 cm/s LVOT VTI:          0.293 m LVOT/AV VTI ratio: 0.68  AORTA Ao Root diam: 3.00 cm  MITRAL VALVE MV Area (PHT): 2.03 cm MV PHT:        108.17 msec MV Decel Time: 373 msec MV E velocity: 61.80 cm/s MV A velocity: 115.00 cm/s MV E/A ratio:  0.54   Fransico Him MD Electronically signed by Fransico Him MD Signature Date/Time: 07/01/2018/1:09:50 PM    Final              Recent Labs: No results found for requested labs within last 365 days.  Recent Lipid Panel    Component Value Date/Time   CHOL 189 07/02/2018 0330   TRIG 156 (H) 07/02/2018 0330   HDL 33 (L) 07/02/2018 0330   CHOLHDL 5.7 07/02/2018 0330   VLDL 31 07/02/2018 0330   LDLCALC 125 (H) 07/02/2018 0330     Risk Assessment/Calculations:    HYPERTENSION CONTROL Vitals:   08/02/22 1412 08/02/22 1444  BP: (!) 202/88 (!) 200/80    The patient's blood pressure is elevated above target today.  In order to address the patient's elevated BP: A new medication was prescribed today.            Physical Exam:    VS:  BP (!) 200/80   Pulse (!) 59   Ht 5\' 7"  (1.702 m)   Wt 148 lb (67.1 kg)   SpO2 95%   BMI 23.18 kg/m     Wt Readings from Last 3 Encounters:  08/02/22 148 lb (67.1 kg)  09/20/19 190 lb (86.2 kg)  07/02/18 175 lb 6.4 oz  (79.6 kg)    GEN:  Well nourished, well developed in no acute distress HEENT: Normal NECK: No JVD CARDIAC: RRR, no rubs, gallops; systolic heart murmur RESPIRATORY:  Clear to auscultation without rales, wheezing or rhonchi  ABDOMEN: Soft, non-tender, non-distended MUSCULOSKELETAL:  +1 bilateral edema; No deformity  SKIN: Warm and dry NEUROLOGIC:  Alert and oriented x 3 PSYCHIATRIC:  Normal affect   ASSESSMENT:    1. Hypertension, unspecified type   2. Pre-op evaluation   3. Heart failure, type unknown   4. LVH (left ventricular hypertrophy)   5. Systolic murmur    PLAN:    Preoperative Risk Assessment - I do not predict anesthesia would feel comfortable with elective knee surgery in the setting of hypertensive urgency; needs BP control prior to surgery - minimal functional METS- Stress test given LVH NM Stress  Hypertensive urgency - in the setting of medication non-adherence - discussed ED eval and indications - will return losartan 50 mg (we was not taking this) BMP in 7- 10 days; at that time he will send up is follow up BP and we will likely increase dose or add diuretic - continue propranolol for now  LE edema Hx of HF Recovered EF - will repeat echo  LVH - related to longstanding HTN  HLD - Continue zetia for now  Two month f/u with our team       Medication Adjustments/Labs and Tests Ordered: Current medicines are reviewed at length with the patient today.  Concerns regarding medicines are outlined above.  Orders Placed This Encounter  Procedures   Basic metabolic panel   MYOCARDIAL PERFUSION IMAGING   EKG 12-Lead   ECHOCARDIOGRAM COMPLETE   Meds ordered this encounter  Medications   losartan (COZAAR) 50 MG tablet    Sig: Take 1 tablet (50 mg total) by mouth daily.    Dispense:  90 tablet    Refill:  3    Patient Instructions  Medication Instructions:  Your physician has recommended you make the following change in your medication:  RESTART:  Losartan 50 mg by mouth once daily  PLEASE: monitor your Blood pressure  *If you need a refill on your cardiac medications before your next appointment, please call your pharmacy*   Lab Work: IN 7-10 DAYS:BMP  If you have labs (blood work) drawn today and your tests are completely normal, you will receive your results only by: [place] (if you have MyChart) OR A paper copy in the mail If you have any lab test that is abnormal or we need to change your treatment, we will call you to review the results.   Testing/Procedures: Your physician has requested that you have an echocardiogram. Echocardiography is a painless test that uses sound waves to create images of your heart. It provides your doctor with information about the size and shape of your heart and how well your heart's chambers and valves are working. This procedure takes approximately one hour. There are no restrictions for this procedure. Please do NOT wear cologne, perfume, aftershave, or lotions (deodorant is allowed). Please arrive 15 minutes prior to your appointment time.   Your physician has requested that you have en exercise stress myoview. For further information please visit [email or web address]. Please follow instruction sheet, as given.    You are scheduled for a Myocardial Perfusion Imaging Study. Please arrive 15 minutes prior to your appointment time for registration and insurance purposes.   The test will take approximately 3 to 4 hours to complete; you may bring reading material.  If someone comes with you to your appointment, they will need to remain in the main lobby due to limited space in the testing area.    How to prepare for your Myocardial Perfusion Test: Do not eat or drink 3 hours prior to your test, except you may have water.  Do not consume products containing caffeine (regular or decaffeinated) 12 hours prior to your test. (ex: coffee, chocolate, sodas, tea). Do bring a list of your current  medications with you.  If not listed below, you may take your medications as normal. Do wear comfortable clothes (no dresses or overalls) and walking shoes, tennis shoes preferred (No heels or open toe shoes are allowed). Do NOT wear cologne, perfume, aftershave, or lotions (deodorant is allowed). If these instructions are not followed, your test will have to be rescheduled.  If you cannot keep your appointment, please provide 24 hours notification to the [facility], to avoid a possible $50 charge to your account.       Follow-Up: At [facility], you and your health needs are our priority.  As part of our continuing mission to provide you with exceptional heart care, we have created designated Provider Care Teams.  These Care Teams include your primary Cardiologist (physician) and Advanced Practice Providers (APPs -  Physician Assistants and Nurse Practitioners) who all work together to provide you with the care you need, when you need it.  We recommend signing up for the patient portal called "MyChart".  Sign up information is provided on this After Visit Summary.  MyChart is used to connect with patients for Virtual Visits (Telemedicine).  Patients are able to view lab/test results, encounter notes, upcoming appointments, etc.  Non-urgent messages can be sent to your provider as well.   To learn more about what you can do with MyChart, go to [email or web address].    Your next appointment:   2 month(s)  Provider:   Nicholes Rough, PA     Signed, Werner Lean, MD  08/02/2022 2:53 PM    [place]

## 2022-08-05 ENCOUNTER — Telehealth (HOSPITAL_COMMUNITY): Payer: Self-pay | Admitting: *Deleted

## 2022-08-05 NOTE — Telephone Encounter (Signed)
Spoke with pt and gave detailed instructions for MPI study scheduled on 08/10/22.

## 2022-08-10 ENCOUNTER — Ambulatory Visit (HOSPITAL_COMMUNITY): Payer: Medicare Other | Attending: Internal Medicine

## 2022-08-10 ENCOUNTER — Ambulatory Visit: Payer: Medicare Other

## 2022-08-10 ENCOUNTER — Telehealth: Payer: Self-pay

## 2022-08-10 DIAGNOSIS — Z01818 Encounter for other preprocedural examination: Secondary | ICD-10-CM | POA: Diagnosis not present

## 2022-08-10 DIAGNOSIS — R079 Chest pain, unspecified: Secondary | ICD-10-CM | POA: Diagnosis not present

## 2022-08-10 DIAGNOSIS — I10 Essential (primary) hypertension: Secondary | ICD-10-CM | POA: Diagnosis not present

## 2022-08-10 LAB — MYOCARDIAL PERFUSION IMAGING
LV dias vol: 193 mL (ref 62–150)
LV sys vol: 94 mL
Nuc Stress EF: 52 %
Peak HR: 78 {beats}/min
Rest HR: 47 {beats}/min
Rest Nuclear Isotope Dose: 10.4 mCi
SDS: 8
SRS: 0
SSS: 8
ST Depression (mm): 0 mm
Stress Nuclear Isotope Dose: 32.6 mCi
TID: 0.99

## 2022-08-10 MED ORDER — TECHNETIUM TC 99M TETROFOSMIN IV KIT
10.4000 | PACK | Freq: Once | INTRAVENOUS | Status: AC | PRN
  Administered 2022-08-10: 10.4 via INTRAVENOUS

## 2022-08-10 MED ORDER — AMLODIPINE BESYLATE 5 MG PO TABS: 5.0000 mg | ORAL_TABLET | Freq: Every day | ORAL | 3 refills | Status: DC

## 2022-08-10 MED ORDER — TECHNETIUM TC 99M TETROFOSMIN IV KIT
32.6000 | PACK | Freq: Once | INTRAVENOUS | Status: AC | PRN
  Administered 2022-08-10: 32.6 via INTRAVENOUS

## 2022-08-10 MED ORDER — AMINOPHYLLINE 25 MG/ML IV SOLN
75.0000 mg | Freq: Once | INTRAVENOUS | Status: AC
  Administered 2022-08-10: 75 mg via INTRAVENOUS

## 2022-08-10 MED ORDER — REGADENOSON 0.4 MG/5ML IV SOLN
0.4000 mg | Freq: Once | INTRAVENOUS | Status: AC
  Administered 2022-08-10: 0.4 mg via INTRAVENOUS

## 2022-08-10 NOTE — Telephone Encounter (Signed)
-----   Message from Christell Constant, MD sent at 08/10/2022  4:49 PM EDT ----- Results: HTN urgency without perfusion defect Plan: Norvasc 5 mg  Christell Constant, MD

## 2022-08-10 NOTE — Telephone Encounter (Signed)
The patient has been notified of the result and verbalized understanding.  All questions (if any) were answered. Macie Burows, RN 08/10/2022 5:09 PM   Reviewed with pt the importance of BP control.  Pt reports has dealt with HTN for years.  Again advised pt at increased risk of a stroke with HTN.  Pt reports will pick up medication from pharmacy tonight.

## 2022-08-11 LAB — BASIC METABOLIC PANEL
BUN/Creatinine Ratio: 17 (ref 10–24)
BUN: 17 mg/dL (ref 8–27)
CO2: 22 mmol/L (ref 20–29)
Calcium: 9.4 mg/dL (ref 8.6–10.2)
Chloride: 101 mmol/L (ref 96–106)
Creatinine, Ser: 1.01 mg/dL (ref 0.76–1.27)
Glucose: 109 mg/dL — ABNORMAL HIGH (ref 70–99)
Potassium: 4.7 mmol/L (ref 3.5–5.2)
Sodium: 139 mmol/L (ref 134–144)
eGFR: 79 mL/min/{1.73_m2} (ref >59)

## 2022-08-27 ENCOUNTER — Ambulatory Visit (HOSPITAL_COMMUNITY): Payer: Medicare Other | Attending: Internal Medicine

## 2022-08-27 DIAGNOSIS — I10 Essential (primary) hypertension: Secondary | ICD-10-CM | POA: Diagnosis not present

## 2022-08-27 DIAGNOSIS — Z01818 Encounter for other preprocedural examination: Secondary | ICD-10-CM

## 2022-08-27 LAB — ECHOCARDIOGRAM COMPLETE
AR max vel: 1.3 cm2
AV Area VTI: 1.34 cm2
AV Area mean vel: 1.26 cm2
AV Mean grad: 19.6 mmHg
AV Peak grad: 34.2 mmHg
Ao pk vel: 2.92 m/s
Area-P 1/2: 2.68 cm2
P 1/2 time: 506 msec
S' Lateral: 2.9 cm

## 2022-09-06 ENCOUNTER — Telehealth: Payer: Self-pay

## 2022-09-06 DIAGNOSIS — I10 Essential (primary) hypertension: Secondary | ICD-10-CM

## 2022-09-06 DIAGNOSIS — I350 Nonrheumatic aortic (valve) stenosis: Secondary | ICD-10-CM

## 2022-09-06 DIAGNOSIS — I351 Nonrheumatic aortic (valve) insufficiency: Secondary | ICD-10-CM

## 2022-09-06 MED ORDER — LOSARTAN POTASSIUM 100 MG PO TABS: 100.0000 mg | ORAL_TABLET | Freq: Every day | ORAL | 3 refills | Status: DC

## 2022-09-06 NOTE — Telephone Encounter (Signed)
-----   Message from Christell Constant, MD sent at 08/29/2022 12:21 PM EDT ----- Results: Forme fruste Aortic aortic valve: Mild to moderate AS, mild AI and mild aortic dilation Elevated systolic blood pressure Plan: Losartan to 100 mg and Bmp in two weeks Echo in one year  Christell Constant, MD

## 2022-09-06 NOTE — Telephone Encounter (Signed)
The patient has been notified of the result and verbalized understanding.  All questions (if any) were answered. Arvid Right Clark Clowdus, RN 09/06/2022 2:07 PM   Pt will come in for f/u labs on 09/14/22.

## 2022-09-14 ENCOUNTER — Ambulatory Visit: Payer: Medicare Other | Attending: Internal Medicine

## 2022-09-14 DIAGNOSIS — I10 Essential (primary) hypertension: Secondary | ICD-10-CM | POA: Diagnosis not present

## 2022-09-15 LAB — BASIC METABOLIC PANEL
BUN/Creatinine Ratio: 14 (ref 10–24)
BUN: 12 mg/dL (ref 8–27)
CO2: 23 mmol/L (ref 20–29)
Calcium: 9.6 mg/dL (ref 8.6–10.2)
Chloride: 103 mmol/L (ref 96–106)
Creatinine, Ser: 0.86 mg/dL (ref 0.76–1.27)
Glucose: 111 mg/dL — ABNORMAL HIGH (ref 70–99)
Potassium: 4.8 mmol/L (ref 3.5–5.2)
Sodium: 142 mmol/L (ref 134–144)
eGFR: 92 mL/min/{1.73_m2} (ref >59)

## 2022-09-17 ENCOUNTER — Telehealth: Payer: Self-pay

## 2022-09-17 DIAGNOSIS — I10 Essential (primary) hypertension: Secondary | ICD-10-CM

## 2022-09-17 MED ORDER — AMLODIPINE BESYLATE 10 MG PO TABS: 10.0000 mg | ORAL_TABLET | Freq: Every day | ORAL | 3 refills | Status: DC

## 2022-09-17 NOTE — Telephone Encounter (Signed)
Spoke with the patient and advised him on medication change per Dr. Georges Lynch. Patient verbalized understanding. Referral has been placed.

## 2022-09-17 NOTE — Telephone Encounter (Signed)
-----   Message from Christell Constant, MD sent at 09/17/2022  4:28 PM EDT ----- [name] [name] to 10 mg Referral for HTN clinic ----- Message ----- From: Frutoso Schatz, RN Sent: 09/17/2022  12:51 PM EDT To: Christell Constant, MD  The patient has been notified of the result and verbalized understanding.  All questions (if any) were answered. Frutoso Schatz, RN 09/17/2022 12:51 PM   Patient states that his blood pressure has been 180s/80s

## 2022-10-15 ENCOUNTER — Encounter: Payer: Self-pay | Admitting: Physician Assistant

## 2022-10-15 ENCOUNTER — Ambulatory Visit: Payer: Medicare Other | Attending: Physician Assistant | Admitting: Physician Assistant

## 2022-10-15 ENCOUNTER — Ambulatory Visit: Payer: Medicare Other | Admitting: Physician Assistant

## 2022-10-15 VITALS — BP 180/70 | Ht 67.0 in | Wt 201.0 lb

## 2022-10-15 DIAGNOSIS — E785 Hyperlipidemia, unspecified: Secondary | ICD-10-CM | POA: Diagnosis not present

## 2022-10-15 DIAGNOSIS — R600 Localized edema: Secondary | ICD-10-CM

## 2022-10-15 DIAGNOSIS — I517 Cardiomegaly: Secondary | ICD-10-CM

## 2022-10-15 DIAGNOSIS — I160 Hypertensive urgency: Secondary | ICD-10-CM | POA: Diagnosis not present

## 2022-10-15 DIAGNOSIS — I10 Essential (primary) hypertension: Secondary | ICD-10-CM

## 2022-10-15 MED ORDER — VALSARTAN-HYDROCHLOROTHIAZIDE 160-12.5 MG PO TABS: 1.0000 | ORAL_TABLET | Freq: Every day | ORAL | 2 refills | Status: DC

## 2022-10-15 NOTE — Progress Notes (Signed)
Cardiology Office Note:  .   Date:  10/15/2022  ID:  [name] [name], DOB 05/05/1950, MRN [id number] PCP: Kaleen Mask, MD  [facility] Providers Cardiologist:  Christell Constant, MD {  History of Present Illness: .   [name] [name] is a 72 y.o. male with a past medical history of heart failure with prior systolic dysfunction with recovered LVEF in 2020, former alcoholic sclerosis with varices, hypertension, hyperlipidemia, OSA prior to ENT surgery in 2021 here today for follow-up.  He was last seen in the clinic April 2024 and at that time he was feeling great.  Blood pressure was very elevated.  He had not followed up with cardiology since 2015.  He was only taking Zetia and propranolol.  Denied chest pain/tightness.  No SOB no syncope no near syncope.  No palpitations/DOE.  He does admit to being sedentary and lost his wife in 2016.  Plan to travel to country after retirement but wife had cancer.  Ambulatory SBP 230 mmHg on average.  Started on losartan 50 mg with a follow-up BMP in 7 to 10 days.  Today, he tells me that his blood pressure has been as high as 230 on the top number.  It is a little lower today 180/70.  He states that Dr. Izora Ribas already increased it to 100 mg of losartan.  This was done a few weeks ago.  His blood pressure is still elevated.  We discussed changing him to valsartan and adding a diuretic.  He is on board with this.  We will plan labs in a week.   Reports no shortness of breath nor dyspnea on exertion. Reports no chest pain, pressure, or tightness. No edema, orthopnea, PND. Reports no palpitations.    ROS: Please see HPI for pertinent ROS  Studies Reviewed: Marland Kitchen    EKG:  Not ordered today  IMPRESSIONS     1. Fusion of left and noncoronary cusps with moderate AS (mean gradient  20 mmHg, DI 0.47; AVA 1.3 cm2); mild AI.   2. Left ventricular ejection fraction, by estimation, is 60 to 65%. The  left ventricle has normal function. The  left ventricle has no regional  wall motion abnormalities. There is moderate left ventricular hypertrophy.  Left ventricular diastolic  parameters are consistent with Grade I diastolic dysfunction (impaired  relaxation). Elevated left atrial pressure.   3. Right ventricular systolic function is normal. The right ventricular  size is normal.   4. Left atrial size was mildly dilated.   5. The mitral valve is normal in structure. Mild mitral valve  regurgitation. No evidence of mitral stenosis. Moderate mitral annular  calcification.   6. The aortic valve is tricuspid. Aortic valve regurgitation is mild.  Moderate aortic valve stenosis.   7. Aortic dilatation noted. There is mild dilatation of the ascending  aorta, measuring 41 mm.   8. The inferior vena cava is normal in size with greater than 50%  respiratory variability, suggesting right atrial pressure of 3 mmHg.   FINDINGS   Left Ventricle: Left ventricular ejection fraction, by estimation, is 60  to 65%. The left ventricle has normal function. The left ventricle has no  regional wall motion abnormalities. The left ventricular internal cavity  size was normal in size. There is   moderate left ventricular hypertrophy. Left ventricular diastolic  parameters are consistent with Grade I diastolic dysfunction (impaired  relaxation). Elevated left atrial pressure.   Right Ventricle: The right ventricular size is normal. Right  ventricular  systolic function is normal.   Left Atrium: Left atrial size was mildly dilated.   Right Atrium: Right atrial size was normal in size.   Pericardium: There is no evidence of pericardial effusion.   Mitral Valve: The mitral valve is normal in structure. Moderate mitral  annular calcification. Mild mitral valve regurgitation. No evidence of  mitral valve stenosis.   Tricuspid Valve: The tricuspid valve is normal in structure. Tricuspid  valve regurgitation is trivial. No evidence of tricuspid  stenosis.   Aortic Valve: The aortic valve is tricuspid. Aortic valve regurgitation is  mild. Aortic regurgitation PHT measures 506 msec. Moderate aortic stenosis  is present. Aortic valve mean gradient measures 19.6 mmHg. Aortic valve  peak gradient measures 34.2  mmHg. Aortic valve area, by VTI measures 1.34 cm.   Pulmonic Valve: The pulmonic valve was normal in structure. Pulmonic valve  regurgitation is trivial. No evidence of pulmonic stenosis.   Aorta: Aortic dilatation noted. There is mild dilatation of the ascending  aorta, measuring 41 mm.   Venous: The inferior vena cava is normal in size with greater than 50%  respiratory variability, suggesting right atrial pressure of 3 mmHg.   IAS/Shunts: No atrial level shunt detected by color flow Doppler.   Additional Comments: Fusion of left and noncoronary cusps with moderate AS  (mean gradient 20 mmHg, DI 0.47; AVA 1.3 cm2); mild AI.      Physical Exam:   VS:  BP (!) 180/70   Ht 5\' 7"  (1.702 m)   Wt 201 lb (91.2 kg)   SpO2 97%   BMI 31.48 kg/m    Wt Readings from Last 3 Encounters:  10/15/22 201 lb (91.2 kg)  08/10/22 198 lb (89.8 kg)  08/02/22 148 lb (67.1 kg)    GEN: Well nourished, well developed in no acute distress NECK: No JVD; No carotid bruits CARDIAC: RRR, no murmurs, rubs, gallops RESPIRATORY:  Clear to auscultation without rales, wheezing or rhonchi  ABDOMEN: Soft, non-tender, non-distended EXTREMITIES:  No edema; No deformity   ASSESSMENT AND PLAN: .   1.  Hypertensive urgency  -valsartan 160mg  /hydrochlorothiazide 12.5mg , will likely need an increase -BMP in  a week -follow-up in 2 weeks for blood pressure check  2.  Lower extremity edema -present today -Added hydrochlorothiazide -Continue to elevate as able  3.  LVH -This is related to his longstanding hypertension -We will continue to titrate his medications as indicated  4.  HLD -Continue Zetia 10 mg daily      Dispo: Follow-up in 2  weeks with me for a blood pressure check and titration of medications.  Signed, Sharlene Dory, PA-C

## 2022-10-15 NOTE — Patient Instructions (Addendum)
Medication Instructions:   START TAKING:  DIOVAN( VALSARTAN-HYDROCHLOROTHIAZIDE)160-12.5 MG  TABLET ONCE  A DAY   STOP TAKING AND REMOVE THIS MEDICATION FROM YOUR MEDICATION LIST:  LOSARTAN 100 MG    *If you need a refill on your cardiac medications before your next appointment, please call your pharmacy*   Lab Work:   RETURN IN A  WEEK FOR BMET   If you have labs (blood work) drawn today and your tests are completely normal, you will receive your results only by: MyChart Message (if you have MyChart) OR A paper copy in the mail If you have any lab test that is abnormal or we need to change your treatment, we will call you to review the results.   Testing/Procedures: NONE ORDERED  TODAY    Follow-Up: At [facility], you and your health needs are our priority.  As part of our continuing mission to provide you with exceptional heart care, we have created designated Provider Care Teams.  These Care Teams include your primary Cardiologist (physician) and Advanced Practice Providers (APPs -  Physician Assistants and Nurse Practitioners) who all work together to provide you with the care you need, when you need it.  We recommend signing up for the patient portal called "MyChart".  Sign up information is provided on this After Visit Summary.  MyChart is used to connect with patients for Virtual Visits (Telemedicine).  Patients are able to view lab/test results, encounter notes, upcoming appointments, etc.  Non-urgent messages can be sent to your provider as well.   To learn more about what you can do with MyChart, go to [email or web address].    Your next appointment:    2 -3 week(s)  Provider:     Other Instructions  Low-Sodium Eating Plan  Salt (sodium) helps you keep a healthy balance of fluids in your body. Too much sodium can raise your blood pressure. It can also cause fluid and waste to be held in your body. Your health care provider or dietitian may recommend a  low-sodium eating plan if you have high blood pressure (hypertension), kidney disease, liver disease, or heart failure. Eating less sodium can help lower your blood pressure and reduce swelling. It can also protect your heart, liver, and kidneys. What are tips for following this plan? Reading food labels  Check food labels for the amount of sodium per serving. If you eat more than one serving, you must multiply the listed amount by the number of servings. Choose foods with less than 140 milligrams (mg) of sodium per serving. Avoid foods with 300 mg of sodium or more per serving. Always check how much sodium is in a product, even if the label says "unsalted" or "no salt added." Shopping  Buy products labeled as "low-sodium" or "no salt added." Buy fresh foods. Avoid canned foods and pre-made or frozen meals. Avoid canned, cured, or processed meats. Buy breads that have less than 80 mg of sodium per slice. Cooking  Eat more home-cooked food. Try to eat less restaurant, buffet, and fast food. Try not to add salt when you cook. Use salt-free seasonings or herbs instead of table salt or sea salt. Check with your provider or pharmacist before using salt substitutes. Cook with plant-based oils, such as canola, sunflower, or olive oil. Meal planning When eating at a restaurant, ask if your food can be made with less salt or no salt. Avoid dishes labeled as brined, pickled, cured, or smoked. Avoid dishes made with soy sauce, miso, or  teriyaki sauce. Avoid foods that have monosodium glutamate (MSG) in them. MSG may be added to some restaurant food, sauces, soups, bouillon, and canned foods. Make meals that can be grilled, baked, poached, roasted, or steamed. These are often made with less sodium. General information Try to limit your sodium intake to 1,500-2,300 mg each day, or the amount told by your provider. What foods should I eat? Fruits Fresh, frozen, or canned fruit. Fruit  juice. Vegetables Fresh or frozen vegetables. "No salt added" canned vegetables. "No salt added" tomato sauce and paste. Low-sodium or reduced-sodium tomato and vegetable juice. Grains Low-sodium cereals, such as oats, puffed wheat and rice, and shredded wheat. Low-sodium crackers. Unsalted rice. Unsalted pasta. Low-sodium bread. Whole grain breads and whole grain pasta. Meats and other proteins Fresh or frozen meat, poultry, seafood, and fish. These should have no added salt. Low-sodium canned tuna and salmon. Unsalted nuts. Dried peas, beans, and lentils without added salt. Unsalted canned beans. Eggs. Unsalted nut butters. Dairy Milk. Soy milk. Cheese that is naturally low in sodium, such as ricotta cheese, fresh mozzarella, or Swiss cheese. Low-sodium or reduced-sodium cheese. Cream cheese. Yogurt. Seasonings and condiments Fresh and dried herbs and spices. Salt-free seasonings. Low-sodium mustard and ketchup. Sodium-free salad dressing. Sodium-free light mayonnaise. Fresh or refrigerated horseradish. Lemon juice. Vinegar. Other foods Homemade, reduced-sodium, or low-sodium soups. Unsalted popcorn and pretzels. Low-salt or salt-free chips. The items listed above may not be all the foods and drinks you can have. Talk to a dietitian to learn more. What foods should I avoid? Vegetables Sauerkraut, pickled vegetables, and relishes. Olives. [place] fries. Onion rings. Regular canned vegetables, except low-sodium or reduced-sodium items. Regular canned tomato sauce and paste. Regular tomato and vegetable juice. Frozen vegetables in sauces. Grains Instant hot cereals. Bread stuffing, pancake, and biscuit mixes. Croutons. Seasoned rice or pasta mixes. Noodle soup cups. Boxed or frozen macaroni and cheese. Regular salted crackers. Self-rising flour. Meats and other proteins Meat or fish that is salted, canned, smoked, spiced, or pickled. Precooked or cured meat, such as sausages or meat loaves. Tomasa Blase.  Ham. Pepperoni. Hot dogs. Corned beef. Chipped beef. Salt pork. Jerky. Pickled herring, anchovies, and sardines. Regular canned tuna. Salted nuts. Dairy Processed cheese and cheese spreads. Hard cheeses. Cheese curds. Blue cheese. Feta cheese. String cheese. Regular cottage cheese. Buttermilk. Canned milk. Fats and oils Salted butter. Regular margarine. Ghee. Bacon fat. Seasonings and condiments Onion salt, garlic salt, seasoned salt, table salt, and sea salt. Canned and packaged gravies. Worcestershire sauce. Tartar sauce. Barbecue sauce. Teriyaki sauce. Soy sauce, including reduced-sodium soy sauce. Steak sauce. Fish sauce. Oyster sauce. Cocktail sauce. Horseradish that you find on the shelf. Regular ketchup and mustard. Meat flavorings and tenderizers. Bouillon cubes. Hot sauce. Pre-made or packaged marinades. Pre-made or packaged taco seasonings. Relishes. Regular salad dressings. Salsa. Other foods Salted popcorn and pretzels. Corn chips and puffs. Potato and tortilla chips. Canned or dried soups. Pizza. Frozen entrees and pot pies. The items listed above may not be all the foods and drinks you should avoid. Talk to a dietitian to learn more. This information is not intended to replace advice given to you by your health care provider. Make sure you discuss any questions you have with your health care provider. Document Revised: 05/06/2022 Document Reviewed: 05/06/2022 Elsevier Patient Education  2024 [employer]    Heart-Healthy Eating Plan Eating a healthy diet is important for the health of your heart. A heart-healthy eating plan includes: Eating less unhealthy fats. Eating more  healthy fats. Eating less salt in your food. Salt is also called sodium. Making other changes in your diet. Talk with your doctor or a diet specialist (dietitian) to create an eating plan that is right for you. What is my plan? Your doctor may recommend an eating plan that includes: Total fat: ______% or  less of total calories a day. Saturated fat: ______% or less of total calories a day. Cholesterol: less than _________mg a day. Sodium: less than _________mg a day. What are tips for following this plan? Cooking Avoid frying your food. Try to bake, boil, grill, or broil it instead. You can also reduce fat by: Removing the skin from poultry. Removing all visible fats from meats. Steaming vegetables in water or broth. Meal planning  At meals, divide your plate into four equal parts: Fill one-half of your plate with vegetables and green salads. Fill one-fourth of your plate with whole grains. Fill one-fourth of your plate with lean protein foods. Eat 2-4 cups of vegetables per day. One cup of vegetables is: 1 cup (91 g) broccoli or cauliflower florets. 2 medium carrots. 1 large bell pepper. 1 large sweet potato. 1 large tomato. 1 medium white potato. 2 cups (150 g) raw leafy greens. Eat 1-2 cups of fruit per day. One cup of fruit is: 1 small apple 1 large banana 1 cup (237 g) mixed fruit, 1 large orange,  cup (82 g) dried fruit, 1 cup (240 mL) 100% fruit juice. Eat more foods that have soluble fiber. These are apples, broccoli, carrots, beans, peas, and barley. Try to get 20-30 g of fiber per day. Eat 4-5 servings of nuts, legumes, and seeds per week: 1 serving of dried beans or legumes equals  cup (90 g) cooked. 1 serving of nuts is  oz (12 almonds, 24 pistachios, or 7 walnut halves). 1 serving of seeds equals  oz (8 g). General information Eat more home-cooked food. Eat less restaurant, buffet, and fast food. Limit or avoid alcohol. Limit foods that are high in starch and sugar. Avoid fried foods. Lose weight if you are overweight. Keep track of how much salt (sodium) you eat. This is important if you have high blood pressure. Ask your doctor to tell you more about this. Try to add vegetarian meals each week. Fats Choose healthy fats. These include olive oil and  canola oil, flaxseeds, walnuts, almonds, and seeds. Eat more omega-3 fats. These include salmon, mackerel, sardines, tuna, flaxseed oil, and ground flaxseeds. Try to eat fish at least 2 times each week. Check food labels. Avoid foods with trans fats or high amounts of saturated fat. Limit saturated fats. These are often found in animal products, such as meats, butter, and cream. These are also found in plant foods, such as palm oil, palm kernel oil, and coconut oil. Avoid foods with partially hydrogenated oils in them. These have trans fats. Examples are stick margarine, some tub margarines, cookies, crackers, and other baked goods. What foods should I eat? Fruits All fresh, canned (in natural juice), or frozen fruits. Vegetables Fresh or frozen vegetables (raw, steamed, roasted, or grilled). Green salads. Grains Most grains. Choose whole wheat and whole grains most of the time. Rice and pasta, including brown rice and pastas made with whole wheat. Meats and other proteins Lean, well-trimmed beef, veal, pork, and lamb. Chicken and [place] without skin. All fish and shellfish. Wild duck, rabbit, pheasant, and venison. Egg whites or low-cholesterol egg substitutes. Dried beans, peas, lentils, and tofu. Seeds and most  nuts. Dairy Low-fat or nonfat cheeses, including ricotta and mozzarella. Skim or 1% milk that is liquid, powdered, or evaporated. Buttermilk that is made with low-fat milk. Nonfat or low-fat yogurt. Fats and oils Non-hydrogenated (trans-free) margarines. Vegetable oils, including soybean, sesame, sunflower, olive, peanut, safflower, corn, canola, and cottonseed. Salad dressings or mayonnaise made with a vegetable oil. Beverages Mineral water. Coffee and tea. Diet carbonated beverages. Sweets and desserts Sherbet, gelatin, and fruit ice. Small amounts of dark chocolate. Limit all sweets and desserts. Seasonings and condiments All seasonings and condiments. The items listed above  may not be a complete list of foods and drinks you can eat. Contact a dietitian for more options. What foods should I avoid? Fruits Canned fruit in heavy syrup. Fruit in cream or butter sauce. Fried fruit. Limit coconut. Vegetables Vegetables cooked in cheese, cream, or butter sauce. Fried vegetables. Grains Breads that are made with saturated or trans fats, oils, or whole milk. Croissants. Sweet rolls. Donuts. High-fat crackers, such as cheese crackers. Meats and other proteins Fatty meats, such as hot dogs, ribs, sausage, bacon, rib-eye roast or steak. High-fat deli meats, such as salami and bologna. Caviar. Domestic duck and goose. Organ meats, such as liver. Dairy Cream, sour cream, cream cheese, and creamed cottage cheese. Whole-milk cheeses. Whole or 2% milk that is liquid, evaporated, or condensed. Whole buttermilk. Cream sauce or high-fat cheese sauce. Yogurt that is made from whole milk. Fats and oils Meat fat, or shortening. Cocoa butter, hydrogenated oils, palm oil, coconut oil, palm kernel oil. Solid fats and shortenings, including bacon fat, salt pork, lard, and butter. Nondairy cream substitutes. Salad dressings with cheese or sour cream. Beverages Regular sodas and juice drinks with added sugar. Sweets and desserts Frosting. Pudding. Cookies. Cakes. Pies. Milk chocolate or white chocolate. Buttered syrups. Full-fat ice cream or ice cream drinks. The items listed above may not be a complete list of foods and drinks to avoid. Contact a dietitian for more information. Summary Heart-healthy meal planning includes eating less unhealthy fats, eating more healthy fats, and making other changes in your diet. Eat a balanced diet. This includes fruits and vegetables, low-fat or nonfat dairy, lean protein, nuts and legumes, whole grains, and heart-healthy oils and fats. This information is not intended to replace advice given to you by your health care provider. Make sure you discuss any  questions you have with your health care provider. Document Revised: 05/25/2021 Document Reviewed: 05/25/2021 Elsevier Patient Education  2024 [employer].

## 2022-10-22 ENCOUNTER — Ambulatory Visit: Payer: Medicare Other | Attending: Physician Assistant

## 2022-10-22 DIAGNOSIS — I10 Essential (primary) hypertension: Secondary | ICD-10-CM

## 2022-10-23 LAB — BASIC METABOLIC PANEL
BUN/Creatinine Ratio: 21 (ref 10–24)
BUN: 25 mg/dL (ref 8–27)
CO2: 27 mmol/L (ref 20–29)
Calcium: 9.6 mg/dL (ref 8.6–10.2)
Chloride: 101 mmol/L (ref 96–106)
Creatinine, Ser: 1.19 mg/dL (ref 0.76–1.27)
Glucose: 114 mg/dL — ABNORMAL HIGH (ref 70–99)
Potassium: 5.2 mmol/L (ref 3.5–5.2)
Sodium: 141 mmol/L (ref 134–144)
eGFR: 65 mL/min/{1.73_m2} (ref >59)

## 2022-11-05 ENCOUNTER — Encounter: Payer: Self-pay | Admitting: Physician Assistant

## 2022-11-05 ENCOUNTER — Ambulatory Visit: Payer: Medicare Other | Attending: Physician Assistant | Admitting: Physician Assistant

## 2022-11-05 VITALS — BP 128/50 | HR 43 | Ht 67.0 in | Wt 196.2 lb

## 2022-11-05 DIAGNOSIS — I160 Hypertensive urgency: Secondary | ICD-10-CM

## 2022-11-05 DIAGNOSIS — I517 Cardiomegaly: Secondary | ICD-10-CM

## 2022-11-05 DIAGNOSIS — E785 Hyperlipidemia, unspecified: Secondary | ICD-10-CM

## 2022-11-05 DIAGNOSIS — R600 Localized edema: Secondary | ICD-10-CM

## 2022-11-05 DIAGNOSIS — Z01818 Encounter for other preprocedural examination: Secondary | ICD-10-CM

## 2022-11-05 MED ORDER — PROPRANOLOL HCL 20 MG PO TABS: 20.0000 mg | ORAL_TABLET | Freq: Two times a day (BID) | ORAL | 3 refills | Status: DC

## 2022-11-05 NOTE — Progress Notes (Signed)
Cardiology Office Note:  .   Date:  11/05/2022  ID:  [name] [name], DOB Feb 18, 1951, MRN [id number] PCP: Kaleen Mask, MD  [facility] Providers Cardiologist:  Christell Constant, MD {  History of Present Illness: .   [name] [name] is a 72 y.o. male with a past medical history of heart failure with prior systolic dysfunction with recovered LVEF in 2020, former alcoholic sclerosis with varices, hypertension, hyperlipidemia, OSA prior to ENT surgery in 2021 here today for follow-up and preop clearance.  He was last seen in the clinic April 2024 and at that time he was feeling great.  Blood pressure was very elevated.  He had not followed up with cardiology since 2015.  He was only taking Zetia and propranolol.  Denied chest pain/tightness.  No SOB no syncope no near syncope.  No palpitations/DOE.  He does admit to being sedentary and lost his wife in 2016.  Plan to travel to country after retirement but wife had cancer.  Ambulatory SBP 230 mmHg on average.  Started on losartan 50 mg with a follow-up BMP in 7 to 10 days.  He was seen by me 10/15/2022, he tells me that his blood pressure has been as high as 230 on the top number.  It is a little lower today 180/70.  He states that Dr. Izora Ribas already increased it to 100 mg of losartan.  This was done a few weeks ago.  His blood pressure is still elevated.  We discussed changing him to valsartan and adding a diuretic.  He is on board with this.  We will plan labs in a week.   Today, he tells me that his biggest issue is his knees.  Typically, he cannot make it to the bathroom in time because his knees are still weak and he cannot move that fast.  Needs a double knee replacement.  Sees Delbert Harness.  Unfortunately, we have been working on cardiac clearance for the last few weeks but his blood pressure has been elevated.  We changed his medications last time and blood pressure looks a lot better however, now heart rate is 43 bpm.  We  discussed decreasing his propranolol and him keeping track of his heart rate and blood pressure at home.  We will recheck an EKG and blood pressure in a week and if stable, we will clear him.  If not, will need to further decrease propranolol and potentially think about ordering a cardiac monitor to check for heart block.  Reports no shortness of breath nor dyspnea on exertion. Reports no chest pain, pressure, or tightness. No edema, orthopnea, PND. Reports no palpitations.    ROS: Please see HPI for pertinent ROS  Studies Reviewed: Marland Kitchen    EKG:  Not ordered today  IMPRESSIONS     1. Fusion of left and noncoronary cusps with moderate AS (mean gradient  20 mmHg, DI 0.47; AVA 1.3 cm2); mild AI.   2. Left ventricular ejection fraction, by estimation, is 60 to 65%. The  left ventricle has normal function. The left ventricle has no regional  wall motion abnormalities. There is moderate left ventricular hypertrophy.  Left ventricular diastolic  parameters are consistent with Grade I diastolic dysfunction (impaired  relaxation). Elevated left atrial pressure.   3. Right ventricular systolic function is normal. The right ventricular  size is normal.   4. Left atrial size was mildly dilated.   5. The mitral valve is normal in structure. Mild mitral valve  regurgitation. No evidence of mitral stenosis. Moderate mitral annular  calcification.   6. The aortic valve is tricuspid. Aortic valve regurgitation is mild.  Moderate aortic valve stenosis.   7. Aortic dilatation noted. There is mild dilatation of the ascending  aorta, measuring 41 mm.   8. The inferior vena cava is normal in size with greater than 50%  respiratory variability, suggesting right atrial pressure of 3 mmHg.   FINDINGS   Left Ventricle: Left ventricular ejection fraction, by estimation, is 60  to 65%. The left ventricle has normal function. The left ventricle has no  regional wall motion abnormalities. The left ventricular  internal cavity  size was normal in size. There is   moderate left ventricular hypertrophy. Left ventricular diastolic  parameters are consistent with Grade I diastolic dysfunction (impaired  relaxation). Elevated left atrial pressure.   Right Ventricle: The right ventricular size is normal. Right ventricular  systolic function is normal.   Left Atrium: Left atrial size was mildly dilated.   Right Atrium: Right atrial size was normal in size.   Pericardium: There is no evidence of pericardial effusion.   Mitral Valve: The mitral valve is normal in structure. Moderate mitral  annular calcification. Mild mitral valve regurgitation. No evidence of  mitral valve stenosis.   Tricuspid Valve: The tricuspid valve is normal in structure. Tricuspid  valve regurgitation is trivial. No evidence of tricuspid stenosis.   Aortic Valve: The aortic valve is tricuspid. Aortic valve regurgitation is  mild. Aortic regurgitation PHT measures 506 msec. Moderate aortic stenosis  is present. Aortic valve mean gradient measures 19.6 mmHg. Aortic valve  peak gradient measures 34.2  mmHg. Aortic valve area, by VTI measures 1.34 cm.   Pulmonic Valve: The pulmonic valve was normal in structure. Pulmonic valve  regurgitation is trivial. No evidence of pulmonic stenosis.   Aorta: Aortic dilatation noted. There is mild dilatation of the ascending  aorta, measuring 41 mm.   Venous: The inferior vena cava is normal in size with greater than 50%  respiratory variability, suggesting right atrial pressure of 3 mmHg.   IAS/Shunts: No atrial level shunt detected by color flow Doppler.   Additional Comments: Fusion of left and noncoronary cusps with moderate AS  (mean gradient 20 mmHg, DI 0.47; AVA 1.3 cm2); mild AI.      Physical Exam:   VS:  There were no vitals taken for this visit.   Wt Readings from Last 3 Encounters:  10/15/22 201 lb (91.2 kg)  08/10/22 198 lb (89.8 kg)  08/02/22 148 lb (67.1 kg)     GEN: Well nourished, well developed in no acute distress NECK: No JVD; No carotid bruits CARDIAC: RRR, no murmurs, rubs, gallops RESPIRATORY:  Clear to auscultation without rales, wheezing or rhonchi  ABDOMEN: Soft, non-tender, non-distended EXTREMITIES:  No edema; No deformity   ASSESSMENT AND PLAN: .   1.  Pre-op Clearance  Mr. Mullally's perioperative risk of a major cardiac event is 0.9% according to the Revised Cardiac Risk Index (RCRI).  Therefore, he is at low risk for perioperative complications.   His functional capacity is fair at 4.73 METs according to the Duke Activity Status Index (DASI). Recommendations: According to ACC/AHA guidelines, no further cardiovascular testing needed.  The patient may proceed to surgery at acceptable risk.   Antiplatelet and/or Anticoagulation Recommendations: The patient should remain on Aspirin without interruption.  However, if bleeding risk is too high can hold aspirin x 5 to 7 days prior to procedure and  restart when medically safe to do so.  2. Bradycardia -no lightheaded or dizziness -no chest pain or SOB  - no palpitations  -would place a monitor if HR does not come up with propranolol dose adjustment  3. Hypertensive urgency  -valsartan 160mg  /hydrochlorothiazide 12.5mg , will likely need an increase -follow-up in 1 weeks for blood pressure check and an EKG  4.  Lower extremity edema -none today -Added hydrochlorothiazide at last visit -Continue to elevate as able  5.  LVH -This is related to his longstanding hypertension -We will continue to titrate his medications as indicated  6.  HLD -Continue Zetia 10 mg daily      Dispo: Follow-up in 1 week for nurse visit  Signed, Sharlene Dory, PA-C

## 2022-11-05 NOTE — Patient Instructions (Signed)
Medication Instructions:  Your provider has recommended you make the following change in your medication:  Decrease propranolol to 20 mg twice daily  *If you need a refill on your cardiac medications before your next appointment, please call your pharmacy*  Lab Work: None ordered If you have labs (blood work) drawn today and your tests are completely normal, you will receive your results only by: MyChart Message (if you have MyChart) OR A paper copy in the mail If you have any lab test that is abnormal or we need to change your treatment, we will call you to review the results.  Follow-Up: At [facility], you and your health needs are our priority.  As part of our continuing mission to provide you with exceptional heart care, we have created designated Provider Care Teams.  These Care Teams include your primary Cardiologist (physician) and Advanced Practice Providers (APPs -  Physician Assistants and Nurse Practitioners) who all work together to provide you with the care you need, when you need it.  Your next appointment:   1 week nurse visit for a blood pressure check and an EKG  Other Instructions Check your blood pressure and hear rate daily, 1 hr after morning medications for 2 weeks, keep a log and send [place] the readings through mychart at the end of the 2 weeks.  Low-Sodium Eating Plan Salt (sodium) helps you keep a healthy balance of fluids in your body. Too much sodium can raise your blood pressure. It can also cause fluid and waste to be held in your body. Your health care provider or dietitian may recommend a low-sodium eating plan if you have high blood pressure (hypertension), kidney disease, liver disease, or heart failure. Eating less sodium can help lower your blood pressure and reduce swelling. It can also protect your heart, liver, and kidneys. What are tips for following this plan? Reading food labels  Check food labels for the amount of sodium per serving. If you  eat more than one serving, you must multiply the listed amount by the number of servings. Choose foods with less than 140 milligrams (mg) of sodium per serving. Avoid foods with 300 mg of sodium or more per serving. Always check how much sodium is in a product, even if the label says "unsalted" or "no salt added." Shopping  Buy products labeled as "low-sodium" or "no salt added." Buy fresh foods. Avoid canned foods and pre-made or frozen meals. Avoid canned, cured, or processed meats. Buy breads that have less than 80 mg of sodium per slice. Cooking  Eat more home-cooked food. Try to eat less restaurant, buffet, and fast food. Try not to add salt when you cook. Use salt-free seasonings or herbs instead of table salt or sea salt. Check with your provider or pharmacist before using salt substitutes. Cook with plant-based oils, such as canola, sunflower, or olive oil. Meal planning When eating at a restaurant, ask if your food can be made with less salt or no salt. Avoid dishes labeled as brined, pickled, cured, or smoked. Avoid dishes made with soy sauce, miso, or teriyaki sauce. Avoid foods that have monosodium glutamate (MSG) in them. MSG may be added to some restaurant food, sauces, soups, bouillon, and canned foods. Make meals that can be grilled, baked, poached, roasted, or steamed. These are often made with less sodium. General information Try to limit your sodium intake to 1,500-2,300 mg each day, or the amount told by your provider. What foods should I eat? Fruits Fresh,  frozen, or canned fruit. Fruit juice. Vegetables Fresh or frozen vegetables. "No salt added" canned vegetables. "No salt added" tomato sauce and paste. Low-sodium or reduced-sodium tomato and vegetable juice. Grains Low-sodium cereals, such as oats, puffed wheat and rice, and shredded wheat. Low-sodium crackers. Unsalted rice. Unsalted pasta. Low-sodium bread. Whole grain breads and whole grain pasta. Meats and  other proteins Fresh or frozen meat, poultry, seafood, and fish. These should have no added salt. Low-sodium canned tuna and salmon. Unsalted nuts. Dried peas, beans, and lentils without added salt. Unsalted canned beans. Eggs. Unsalted nut butters. Dairy Milk. Soy milk. Cheese that is naturally low in sodium, such as ricotta cheese, fresh mozzarella, or Swiss cheese. Low-sodium or reduced-sodium cheese. Cream cheese. Yogurt. Seasonings and condiments Fresh and dried herbs and spices. Salt-free seasonings. Low-sodium mustard and ketchup. Sodium-free salad dressing. Sodium-free light mayonnaise. Fresh or refrigerated horseradish. Lemon juice. Vinegar. Other foods Homemade, reduced-sodium, or low-sodium soups. Unsalted popcorn and pretzels. Low-salt or salt-free chips. The items listed above may not be all the foods and drinks you can have. Talk to a dietitian to learn more. What foods should I avoid? Vegetables Sauerkraut, pickled vegetables, and relishes. Olives. [place] fries. Onion rings. Regular canned vegetables, except low-sodium or reduced-sodium items. Regular canned tomato sauce and paste. Regular tomato and vegetable juice. Frozen vegetables in sauces. Grains Instant hot cereals. Bread stuffing, pancake, and biscuit mixes. Croutons. Seasoned rice or pasta mixes. Noodle soup cups. Boxed or frozen macaroni and cheese. Regular salted crackers. Self-rising flour. Meats and other proteins Meat or fish that is salted, canned, smoked, spiced, or pickled. Precooked or cured meat, such as sausages or meat loaves. Tomasa Blase. Ham. Pepperoni. Hot dogs. Corned beef. Chipped beef. Salt pork. Jerky. Pickled herring, anchovies, and sardines. Regular canned tuna. Salted nuts. Dairy Processed cheese and cheese spreads. Hard cheeses. Cheese curds. Blue cheese. Feta cheese. String cheese. Regular cottage cheese. Buttermilk. Canned milk. Fats and oils Salted butter. Regular margarine. Ghee. Bacon fat. Seasonings  and condiments Onion salt, garlic salt, seasoned salt, table salt, and sea salt. Canned and packaged gravies. Worcestershire sauce. Tartar sauce. Barbecue sauce. Teriyaki sauce. Soy sauce, including reduced-sodium soy sauce. Steak sauce. Fish sauce. Oyster sauce. Cocktail sauce. Horseradish that you find on the shelf. Regular ketchup and mustard. Meat flavorings and tenderizers. Bouillon cubes. Hot sauce. Pre-made or packaged marinades. Pre-made or packaged taco seasonings. Relishes. Regular salad dressings. Salsa. Other foods Salted popcorn and pretzels. Corn chips and puffs. Potato and tortilla chips. Canned or dried soups. Pizza. Frozen entrees and pot pies. The items listed above may not be all the foods and drinks you should avoid. Talk to a dietitian to learn more. This information is not intended to replace advice given to you by your health care provider. Make sure you discuss any questions you have with your health care provider. Document Revised: 05/06/2022 Document Reviewed: 05/06/2022 Elsevier Patient Education  2024 [employer]  Heart-Healthy Eating Plan Many factors influence your heart health, including eating and exercise habits. Heart health is also called coronary health. Coronary risk increases with abnormal blood fat (lipid) levels. A heart-healthy eating plan includes limiting unhealthy fats, increasing healthy fats, limiting salt (sodium) intake, and making other diet and lifestyle changes. What is my plan? Your health care provider may recommend that: You limit your fat intake to _________% or less of your total calories each day. You limit your saturated fat intake to _________% or less of your total calories each day. You limit the amount  of cholesterol in your diet to less than _________ mg per day. You limit the amount of sodium in your diet to less than _________ mg per day. What are tips for following this plan? Cooking Cook foods using methods other than frying.  Baking, boiling, grilling, and broiling are all good options. Other ways to reduce fat include: Removing the skin from poultry. Removing all visible fats from meats. Steaming vegetables in water or broth. Meal planning  At meals, imagine dividing your plate into fourths: Fill one-half of your plate with vegetables and green salads. Fill one-fourth of your plate with whole grains. Fill one-fourth of your plate with lean protein foods. Eat 2-4 cups of vegetables per day. One cup of vegetables equals 1 cup (91 g) broccoli or cauliflower florets, 2 medium carrots, 1 large bell pepper, 1 large sweet potato, 1 large tomato, 1 medium white potato, 2 cups (150 g) raw leafy greens. Eat 1-2 cups of fruit per day. One cup of fruit equals 1 small apple, 1 large banana, 1 cup (237 g) mixed fruit, 1 large orange,  cup (82 g) dried fruit, 1 cup (240 mL) 100% fruit juice. Eat more foods that contain soluble fiber. Examples include apples, broccoli, carrots, beans, peas, and barley. Aim to get 25-30 g of fiber per day. Increase your consumption of legumes, nuts, and seeds to 4-5 servings per week. One serving of dried beans or legumes equals  cup (90 g) cooked, 1 serving of nuts is  oz (12 almonds, 24 pistachios, or 7 walnut halves), and 1 serving of seeds equals  oz (8 g). Fats Choose healthy fats more often. Choose monounsaturated and polyunsaturated fats, such as olive and canola oils, avocado oil, flaxseeds, walnuts, almonds, and seeds. Eat more omega-3 fats. Choose salmon, mackerel, sardines, tuna, flaxseed oil, and ground flaxseeds. Aim to eat fish at least 2 times each week. Check food labels carefully to identify foods with trans fats or high amounts of saturated fat. Limit saturated fats. These are found in animal products, such as meats, butter, and cream. Plant sources of saturated fats include palm oil, palm kernel oil, and coconut oil. Avoid foods with partially hydrogenated oils in them. These  contain trans fats. Examples are stick margarine, some tub margarines, cookies, crackers, and other baked goods. Avoid fried foods. General information Eat more home-cooked food and less restaurant, buffet, and fast food. Limit or avoid alcohol. Limit foods that are high in added sugar and simple starches such as foods made using white refined flour (white breads, pastries, sweets). Lose weight if you are overweight. Losing just 5-10% of your body weight can help your overall health and prevent diseases such as diabetes and heart disease. Monitor your sodium intake, especially if you have high blood pressure. Talk with your health care provider about your sodium intake. Try to incorporate more vegetarian meals weekly. What foods should I eat? Fruits All fresh, canned (in natural juice), or frozen fruits. Vegetables Fresh or frozen vegetables (raw, steamed, roasted, or grilled). Green salads. Grains Most grains. Choose whole wheat and whole grains most of the time. Rice and pasta, including brown rice and pastas made with whole wheat. Meats and other proteins Lean, well-trimmed beef, veal, pork, and lamb. Chicken and [place] without skin. All fish and shellfish. Wild duck, rabbit, pheasant, and venison. Egg whites or low-cholesterol egg substitutes. Dried beans, peas, lentils, and tofu. Seeds and most nuts. Dairy Low-fat or nonfat cheeses, including ricotta and mozzarella. Skim or 1% milk (liquid,  powdered, or evaporated). Buttermilk made with low-fat milk. Nonfat or low-fat yogurt. Fats and oils Non-hydrogenated (trans-free) margarines. Vegetable oils, including soybean, sesame, sunflower, olive, avocado, peanut, safflower, corn, canola, and cottonseed. Salad dressings or mayonnaise made with a vegetable oil. Beverages Water (mineral or sparkling). Coffee and tea. Unsweetened ice tea. Diet beverages. Sweets and desserts Sherbet, gelatin, and fruit ice. Small amounts of dark chocolate. Limit  all sweets and desserts. Seasonings and condiments All seasonings and condiments. The items listed above may not be a complete list of foods and beverages you can eat. Contact a dietitian for more options. What foods should I avoid? Fruits Canned fruit in heavy syrup. Fruit in cream or butter sauce. Fried fruit. Limit coconut. Vegetables Vegetables cooked in cheese, cream, or butter sauce. Fried vegetables. Grains Breads made with saturated or trans fats, oils, or whole milk. Croissants. Sweet rolls. Donuts. High-fat crackers, such as cheese crackers and chips. Meats and other proteins Fatty meats, such as hot dogs, ribs, sausage, bacon, rib-eye roast or steak. High-fat deli meats, such as salami and bologna. Caviar. Domestic duck and goose. Organ meats, such as liver. Dairy Cream, sour cream, cream cheese, and creamed cottage cheese. Whole-milk cheeses. Whole or 2% milk (liquid, evaporated, or condensed). Whole buttermilk. Cream sauce or high-fat cheese sauce. Whole-milk yogurt. Fats and oils Meat fat, or shortening. Cocoa butter, hydrogenated oils, palm oil, coconut oil, palm kernel oil. Solid fats and shortenings, including bacon fat, salt pork, lard, and butter. Nondairy cream substitutes. Salad dressings with cheese or sour cream. Beverages Regular sodas and any drinks with added sugar. Sweets and desserts Frosting. Pudding. Cookies. Cakes. Pies. Milk chocolate or white chocolate. Buttered syrups. Full-fat ice cream or ice cream drinks. The items listed above may not be a complete list of foods and beverages to avoid. Contact a dietitian for more information. Summary Heart-healthy meal planning includes limiting unhealthy fats, increasing healthy fats, limiting salt (sodium) intake and making other diet and lifestyle changes. Lose weight if you are overweight. Losing just 5-10% of your body weight can help your overall health and prevent diseases such as diabetes and heart  disease. Focus on eating a balance of foods, including fruits and vegetables, low-fat or nonfat dairy, lean protein, nuts and legumes, whole grains, and heart-healthy oils and fats. This information is not intended to replace advice given to you by your health care provider. Make sure you discuss any questions you have with your health care provider. Document Revised: 05/25/2021 Document Reviewed: 05/25/2021 Elsevier Patient Education  2024 [employer].

## 2022-11-12 ENCOUNTER — Encounter: Payer: Self-pay | Admitting: Internal Medicine

## 2022-11-12 ENCOUNTER — Ambulatory Visit: Payer: Medicare Other | Attending: Internal Medicine | Admitting: Internal Medicine

## 2022-11-12 VITALS — BP 154/62 | HR 57 | Ht 67.0 in

## 2022-11-12 DIAGNOSIS — Z01818 Encounter for other preprocedural examination: Secondary | ICD-10-CM

## 2022-11-12 NOTE — Patient Instructions (Signed)
Medication Instructions:  No change *If you need a refill on your cardiac medications before your next appointment, please call your pharmacy*   Lab Work: None today If you have labs (blood work) drawn today and your tests are completely normal, you will receive your results only by: MyChart Message (if you have MyChart) OR A paper copy in the mail If you have any lab test that is abnormal or we need to change your treatment, we will call you to review the results.    Follow-Up: We will contact you after Jari Favre PA-C reviews.

## 2022-11-12 NOTE — Progress Notes (Signed)
   Nurse Visit   Date of Encounter: 11/12/2022 ID: [name] [name], DOB 1950/06/12, MRN [id number]  PCP:  Kaleen Mask, MD   [facility] Providers Cardiologist:  Christell Constant, MD      Visit Details   VS:  BP (!) 154/62   Pulse (!) 57   Ht 5\' 7"  (1.702 m)   SpO2 97%   BMI 30.73 kg/m  , BMI Body mass index is 30.73 kg/m.  Wt Readings from Last 3 Encounters:  11/05/22 196 lb 3.2 oz (89 kg)  10/15/22 201 lb (91.2 kg)  08/10/22 198 lb (89.8 kg)     Reason for visit: Preop EKG and BP check Performed today: Vitals, EKG, Provider consulted:Dr.Thukkani (DOD), and Education Changes (medications, testing, etc.) : None Length of Visit: 20 minutes    Medications Adjustments/Labs and Tests Ordered: Orders Placed This Encounter  Procedures   EKG 12-Lead   No orders of the defined types were placed in this encounter.  Pt was here for Preop EKG and BP check. Dr Lynnette Caffey reviewed EKG and stated it looked the same as previous EKGs. No change. Will let Jari Favre review EKG and BP check. BP was elevated at todays visits. Pt was coughing and talking a lot during visit. Pt also stated his knees were really bothering him today.   Signed, Richelle Ito, RN  11/12/2022 4:02 PM

## 2022-12-02 DIAGNOSIS — M25562 Pain in left knee: Secondary | ICD-10-CM | POA: Diagnosis not present

## 2022-12-02 DIAGNOSIS — M1712 Unilateral primary osteoarthritis, left knee: Secondary | ICD-10-CM | POA: Diagnosis not present

## 2022-12-02 DIAGNOSIS — R262 Difficulty in walking, not elsewhere classified: Secondary | ICD-10-CM | POA: Diagnosis not present

## 2022-12-02 DIAGNOSIS — M1711 Unilateral primary osteoarthritis, right knee: Secondary | ICD-10-CM | POA: Diagnosis not present

## 2022-12-02 DIAGNOSIS — M25561 Pain in right knee: Secondary | ICD-10-CM | POA: Diagnosis not present

## 2022-12-02 DIAGNOSIS — M170 Bilateral primary osteoarthritis of knee: Secondary | ICD-10-CM | POA: Diagnosis not present

## 2022-12-15 DIAGNOSIS — M1712 Unilateral primary osteoarthritis, left knee: Secondary | ICD-10-CM | POA: Diagnosis not present

## 2022-12-15 DIAGNOSIS — M25561 Pain in right knee: Secondary | ICD-10-CM | POA: Diagnosis not present

## 2022-12-15 DIAGNOSIS — M170 Bilateral primary osteoarthritis of knee: Secondary | ICD-10-CM | POA: Diagnosis not present

## 2022-12-15 DIAGNOSIS — R262 Difficulty in walking, not elsewhere classified: Secondary | ICD-10-CM | POA: Diagnosis not present

## 2022-12-15 DIAGNOSIS — M25562 Pain in left knee: Secondary | ICD-10-CM | POA: Diagnosis not present

## 2022-12-15 DIAGNOSIS — M1711 Unilateral primary osteoarthritis, right knee: Secondary | ICD-10-CM | POA: Diagnosis not present

## 2022-12-24 DIAGNOSIS — M1711 Unilateral primary osteoarthritis, right knee: Secondary | ICD-10-CM | POA: Diagnosis not present

## 2022-12-24 DIAGNOSIS — M1712 Unilateral primary osteoarthritis, left knee: Secondary | ICD-10-CM | POA: Diagnosis not present

## 2022-12-24 DIAGNOSIS — M25562 Pain in left knee: Secondary | ICD-10-CM | POA: Diagnosis not present

## 2022-12-24 DIAGNOSIS — M25561 Pain in right knee: Secondary | ICD-10-CM | POA: Diagnosis not present

## 2022-12-24 DIAGNOSIS — R262 Difficulty in walking, not elsewhere classified: Secondary | ICD-10-CM | POA: Diagnosis not present

## 2022-12-24 DIAGNOSIS — M170 Bilateral primary osteoarthritis of knee: Secondary | ICD-10-CM | POA: Diagnosis not present

## 2023-01-07 DIAGNOSIS — M170 Bilateral primary osteoarthritis of knee: Secondary | ICD-10-CM | POA: Diagnosis not present

## 2023-01-07 DIAGNOSIS — M1711 Unilateral primary osteoarthritis, right knee: Secondary | ICD-10-CM | POA: Diagnosis not present

## 2023-01-07 DIAGNOSIS — M25562 Pain in left knee: Secondary | ICD-10-CM | POA: Diagnosis not present

## 2023-01-07 DIAGNOSIS — M1712 Unilateral primary osteoarthritis, left knee: Secondary | ICD-10-CM | POA: Diagnosis not present

## 2023-01-07 DIAGNOSIS — R262 Difficulty in walking, not elsewhere classified: Secondary | ICD-10-CM | POA: Diagnosis not present

## 2023-01-07 DIAGNOSIS — M25561 Pain in right knee: Secondary | ICD-10-CM | POA: Diagnosis not present

## 2023-01-14 DIAGNOSIS — M1712 Unilateral primary osteoarthritis, left knee: Secondary | ICD-10-CM | POA: Diagnosis not present

## 2023-01-14 DIAGNOSIS — R262 Difficulty in walking, not elsewhere classified: Secondary | ICD-10-CM | POA: Diagnosis not present

## 2023-01-14 DIAGNOSIS — M25561 Pain in right knee: Secondary | ICD-10-CM | POA: Diagnosis not present

## 2023-01-14 DIAGNOSIS — M170 Bilateral primary osteoarthritis of knee: Secondary | ICD-10-CM | POA: Diagnosis not present

## 2023-01-14 DIAGNOSIS — M1711 Unilateral primary osteoarthritis, right knee: Secondary | ICD-10-CM | POA: Diagnosis not present

## 2023-01-14 DIAGNOSIS — M25562 Pain in left knee: Secondary | ICD-10-CM | POA: Diagnosis not present

## 2023-03-10 DIAGNOSIS — H5203 Hypermetropia, bilateral: Secondary | ICD-10-CM | POA: Diagnosis not present

## 2023-05-27 DIAGNOSIS — Z01818 Encounter for other preprocedural examination: Secondary | ICD-10-CM | POA: Diagnosis not present

## 2023-05-27 DIAGNOSIS — Z0000 Encounter for general adult medical examination without abnormal findings: Secondary | ICD-10-CM | POA: Diagnosis not present

## 2023-05-27 DIAGNOSIS — K7460 Unspecified cirrhosis of liver: Secondary | ICD-10-CM | POA: Diagnosis not present

## 2023-06-01 DIAGNOSIS — R799 Abnormal finding of blood chemistry, unspecified: Secondary | ICD-10-CM | POA: Diagnosis not present

## 2023-06-01 DIAGNOSIS — K8020 Calculus of gallbladder without cholecystitis without obstruction: Secondary | ICD-10-CM | POA: Diagnosis not present

## 2023-06-01 DIAGNOSIS — I77819 Aortic ectasia, unspecified site: Secondary | ICD-10-CM | POA: Diagnosis not present

## 2023-06-01 DIAGNOSIS — E875 Hyperkalemia: Secondary | ICD-10-CM | POA: Diagnosis not present

## 2023-06-01 DIAGNOSIS — I700 Atherosclerosis of aorta: Secondary | ICD-10-CM | POA: Diagnosis not present

## 2023-06-01 DIAGNOSIS — K7460 Unspecified cirrhosis of liver: Secondary | ICD-10-CM | POA: Diagnosis not present

## 2023-06-01 DIAGNOSIS — R748 Abnormal levels of other serum enzymes: Secondary | ICD-10-CM | POA: Diagnosis not present

## 2023-06-14 DIAGNOSIS — M170 Bilateral primary osteoarthritis of knee: Secondary | ICD-10-CM | POA: Diagnosis not present

## 2023-06-15 ENCOUNTER — Other Ambulatory Visit (HOSPITAL_COMMUNITY): Payer: Self-pay | Admitting: Orthopedic Surgery

## 2023-06-15 ENCOUNTER — Telehealth: Payer: Self-pay | Admitting: Internal Medicine

## 2023-06-15 DIAGNOSIS — I739 Peripheral vascular disease, unspecified: Secondary | ICD-10-CM

## 2023-06-15 NOTE — Telephone Encounter (Signed)
   Name: GAUTAM LANGHORST  DOB: 1950-11-21  MRN: [id number]  Primary Cardiologist: Christell Constant, MD  Chart reviewed as part of pre-operative protocol coverage. Because of Mathis Cashman Milbourn's past medical history and time since last visit, he will require a follow-up in-office visit in order to better assess preoperative cardiovascular risk.  Pre-op covering staff: - Please schedule appointment and call patient to inform them. If patient already had an upcoming appointment within acceptable timeframe, please add "pre-op clearance" to the appointment notes so provider is aware. - Please contact requesting surgeon's office via preferred method (i.e, phone, fax) to inform them of need for appointment prior to surgery.  No medications indicated as needing held.  Sharlene Dory, PA-C  06/15/2023, 8:29 AM

## 2023-06-15 NOTE — Telephone Encounter (Signed)
Spoke with patient unable to schedule at this time need to contact tomorrow after  ultra sound

## 2023-06-15 NOTE — Telephone Encounter (Signed)
   Pre-operative Risk Assessment    Patient Name: [name] [name]  DOB: 08-26-1950 MRN: [id number]   Date of last office visit: 11/12/2022 Date of next office visit: none   Request for Surgical Clearance    Procedure:   left total knee arthroplasty  Date of Surgery:  Clearance TBD                                Surgeon:  Dr. Weber Cooks Surgeon's Group or Practice Name:  [facility] Phone number:  [phone] T[facility] End[facility] Fax number:  [phone]   Type of Clearance Requested:   - Medical    Type of Anesthesia:  Spinal   Additional requests/questions:    SignedRoyann Shivers   06/15/2023, 8:17 AM

## 2023-06-16 ENCOUNTER — Ambulatory Visit (HOSPITAL_COMMUNITY)
Admission: RE | Admit: 2023-06-16 | Discharge: 2023-06-16 | Disposition: A | Discharge status: Home / Self Care | Payer: Medicare Other | Source: Ambulatory Visit | Attending: Cardiovascular Disease | Admitting: Cardiovascular Disease

## 2023-06-16 DIAGNOSIS — I739 Peripheral vascular disease, unspecified: Secondary | ICD-10-CM | POA: Insufficient documentation

## 2023-06-16 LAB — VAS US ABI WITH/WO TBI
Left ABI: 0.88
Right ABI: 0.96

## 2023-06-20 NOTE — Telephone Encounter (Signed)
 Third attempt calling patient to schedule in office app for preop clearance no answer and mailbox is full unable to leave vm will update requesting office

## 2023-06-20 NOTE — Telephone Encounter (Signed)
 2nd attnpt: Left message to call back and schedule IN OFFICE PRE OP APPT.

## 2023-06-24 ENCOUNTER — Other Ambulatory Visit: Payer: Self-pay

## 2023-06-24 ENCOUNTER — Ambulatory Visit: Payer: Medicare Other | Attending: Internal Medicine | Admitting: Internal Medicine

## 2023-06-24 VITALS — BP 190/82 | HR 54 | Ht 66.0 in | Wt 211.0 lb

## 2023-06-24 DIAGNOSIS — I517 Cardiomegaly: Secondary | ICD-10-CM | POA: Diagnosis not present

## 2023-06-24 DIAGNOSIS — E785 Hyperlipidemia, unspecified: Secondary | ICD-10-CM | POA: Diagnosis not present

## 2023-06-24 DIAGNOSIS — Z01818 Encounter for other preprocedural examination: Secondary | ICD-10-CM | POA: Diagnosis not present

## 2023-06-24 DIAGNOSIS — I739 Peripheral vascular disease, unspecified: Secondary | ICD-10-CM | POA: Diagnosis not present

## 2023-06-24 MED ORDER — EZETIMIBE 10 MG PO TABS: 10.0000 mg | ORAL_TABLET | Freq: Every day | ORAL | 3 refills | Status: DC

## 2023-06-24 MED ORDER — VALSARTAN-HYDROCHLOROTHIAZIDE 160-12.5 MG PO TABS: 1.0000 | ORAL_TABLET | Freq: Every day | ORAL | 3 refills | Status: DC

## 2023-06-24 NOTE — Patient Instructions (Signed)
 Medication Instructions:  Your physician has recommended you make the following change in your medication:  RESTART: ezetimibe (Zetia) 10 mg by mouth once daily  RESTART:  valsartan-hydrochlorothiazide (Diovan) 160-12.5 mg by mouth once daily  *If you need a refill on your cardiac medications before your next appointment, please call your pharmacy*   Lab Work: IN 1-2 WEEKS: BMP  If you have labs (blood work) drawn today and your tests are completely normal, you will receive your results only by: MyChart Message (if you have MyChart) OR A paper copy in the mail If you have any lab test that is abnormal or we need to change your treatment, we will call you to review the results.   Testing/Procedures: Your physician has requested that you have an echocardiogram. Echocardiography is a painless test that uses sound waves to create images of your heart. It provides your doctor with information about the size and shape of your heart and how well your heart's chambers and valves are working. This procedure takes approximately one hour. There are no restrictions for this procedure. Please do NOT wear cologne, perfume, aftershave, or lotions (deodorant is allowed). Please arrive 15 minutes prior to your appointment time.  Please note: We ask at that you not bring children with you during ultrasound (echo/ vascular) testing. Due to room size and safety concerns, children are not allowed in the ultrasound rooms during exams. Our front office staff cannot provide observation of children in our lobby area while testing is being conducted. An adult accompanying a patient to their appointment will only be allowed in the ultrasound room at the discretion of the ultrasound technician under special circumstances. We apologize for any inconvenience.    Follow-Up: At [facility], you and your health needs are our priority.  As part of our continuing mission to provide you with exceptional heart  care, we have created designated Provider Care Teams.  These Care Teams include your primary Cardiologist (physician) and Advanced Practice Providers (APPs -  Physician Assistants and Nurse Practitioners) who all work together to provide you with the care you need, when you need it.   Your next appointment:   3 month(s)  Provider:   Jari Favre, PA-C

## 2023-06-24 NOTE — Progress Notes (Signed)
 Cardiology Office Note:  .    Date:  06/24/2023  ID:  [name] [name], DOB [birth year], MRN [id number] PCP: Kaleen Mask, MD  [facility] Providers Cardiologist:  Christell Constant, MD     CC: Pre-operative risk stratification   History of Present Illness: .    Discussed the use of AI scribe software for clinical note transcription with the patient, who gave verbal consent to proceed.  [name] [name] is a 73 year old male with heart failure with recovered ejection fraction, alcoholic cirrhosis, hypertension, and hyperlipidemia who presents for preoperative risk stratification for left total knee arthroplasty. He has a history of heart failure with recovered ejection fraction, alcoholic cirrhosis with varices, hypertension, and hyperlipidemia. He was previously lost to follow-up and has not been taking his medications, including Zetia and propranolol, which led to a hypertensive emergency during a recent evaluation for peripheral arterial disease. He acknowledges non-adherence to his blood pressure medications due to forgetfulness and laziness, despite having them at home. This has resulted in significantly elevated blood pressure, posing a risk for elective surgery. No chest pain, breathing issues, palpitations, or syncope are reported. His cardiac history includes aortic stenosis, possibly related to a bicuspid valve, and left ventricular hypertrophy. He has been aware of a heart murmur for many years and remains asymptomatic from a cardiac standpoint, though moderate stenosis was noted in the past. He describes difficulty with physical activities due to knee pain, stating that grocery shopping is challenging because of knee pain rather than cardiac symptoms. He can perform moderate chores like taking out the trash without issues. He lives alone, having retired and lost his wife 26 days later after 32 years of marriage. He manages some household chores independently.         Relevant histories: .  Social  2015: Found to have AS 2016: Lost his wife 2024: Returned to cardiology, HTN urgency 2025: Elevated BP at vascular evaluation ROS: As per HPI.   Studies Reviewed: .   Cardiac Studies & Procedures   ______________________________________________________________________________________________   STRESS TESTS  MYOCARDIAL PERFUSION IMAGING 08/10/2022  Narrative   Markedly hypertensive, resting BP 215/86, increased to 264/128 following stress   The study is normal. The study is low risk.   No ST deviation was noted.   LV perfusion is abnormal. There is no evidence of ischemia. There is no evidence of infarction. Defect 1: There is a medium defect with mild reduction in uptake present in the apical to basal inferior location(s) that is fixed. There is normal wall motion in the defect area. Consistent with artifact caused by diaphragmatic attenuation.   Left ventricular function is abnormal. Global function is mildly reduced. Nuclear stress EF: 52 %. The left ventricular ejection fraction is mildly decreased (45-54%). End diastolic cavity size is severely enlarged. End systolic cavity size is severely enlarged.   Prior study not available for comparison.  Markedly hypertensive, resting BP 215/86, increased to 264/128 following stress Fixed inferior perfusion defect with normal wall motion, consistent with artifact LV is dilated, EDV .  Low normal EF (52%) Low risk study   ECHOCARDIOGRAM  ECHOCARDIOGRAM COMPLETE 08/27/2022  Narrative ECHOCARDIOGRAM REPORT    Patient Name:   [name] [name] Date of Exam: 08/27/2022 Medical Rec #:  [id number]     Height:       67.0 in Accession #:    [id number]    Weight:       198.0 lb Date of Birth:  Oct 16, 1950  BSA:          2.014 m Patient Age:    71 years      BP:           178/80 mmHg Patient Gender: M             HR:           48 bpm. Exam Location:  [address]  Procedure: 2D Echo, Cardiac Doppler  and Color Doppler  Indications:    Z01.818 Pre=operative evaluation I10 Hypertension  History:        Patient has prior history of Echocardiogram examinations, most recent 07/01/2018. CHF; Risk Factors:Hypertension, Sleep Apnea and Dyslipidemia.  Sonographer:    Daphine Deutscher RDCS Referring Phys: [id number] [facility] A Ronette Hank  IMPRESSIONS   1. Fusion of left and noncoronary cusps with moderate AS (mean gradient 20 mmHg, DI 0.47; AVA 1.3 cm2); mild AI. 2. Left ventricular ejection fraction, by estimation, is 60 to 65%. The left ventricle has normal function. The left ventricle has no regional wall motion abnormalities. There is moderate left ventricular hypertrophy. Left ventricular diastolic parameters are consistent with Grade I diastolic dysfunction (impaired relaxation). Elevated left atrial pressure. 3. Right ventricular systolic function is normal. The right ventricular size is normal. 4. Left atrial size was mildly dilated. 5. The mitral valve is normal in structure. Mild mitral valve regurgitation. No evidence of mitral stenosis. Moderate mitral annular calcification. 6. The aortic valve is tricuspid. Aortic valve regurgitation is mild. Moderate aortic valve stenosis. 7. Aortic dilatation noted. There is mild dilatation of the ascending aorta, measuring 41 mm. 8. The inferior vena cava is normal in size with greater than 50% respiratory variability, suggesting right atrial pressure of 3 mmHg.  FINDINGS Left Ventricle: Left ventricular ejection fraction, by estimation, is 60 to 65%. The left ventricle has normal function. The left ventricle has no regional wall motion abnormalities. The left ventricular internal cavity size was normal in size. There is moderate left ventricular hypertrophy. Left ventricular diastolic parameters are consistent with Grade I diastolic dysfunction (impaired relaxation). Elevated left atrial pressure.  Right Ventricle: The right ventricular  size is normal. Right ventricular systolic function is normal.  Left Atrium: Left atrial size was mildly dilated.  Right Atrium: Right atrial size was normal in size.  Pericardium: There is no evidence of pericardial effusion.  Mitral Valve: The mitral valve is normal in structure. Moderate mitral annular calcification. Mild mitral valve regurgitation. No evidence of mitral valve stenosis.  Tricuspid Valve: The tricuspid valve is normal in structure. Tricuspid valve regurgitation is trivial. No evidence of tricuspid stenosis.  Aortic Valve: The aortic valve is tricuspid. Aortic valve regurgitation is mild. Aortic regurgitation PHT measures 506 msec. Moderate aortic stenosis is present. Aortic valve mean gradient measures 19.6 mmHg. Aortic valve peak gradient measures 34.2 mmHg. Aortic valve area, by VTI measures 1.34 cm.  Pulmonic Valve: The pulmonic valve was normal in structure. Pulmonic valve regurgitation is trivial. No evidence of pulmonic stenosis.  Aorta: Aortic dilatation noted. There is mild dilatation of the ascending aorta, measuring 41 mm.  Venous: The inferior vena cava is normal in size with greater than 50% respiratory variability, suggesting right atrial pressure of 3 mmHg.  IAS/Shunts: No atrial level shunt detected by color flow Doppler.  Additional Comments: Fusion of left and noncoronary cusps with moderate AS (mean gradient 20 mmHg, DI 0.47; AVA 1.3 cm2); mild AI.   LEFT VENTRICLE PLAX 2D LVIDd:  4.90 cm   Diastology LVIDs:         2.90 cm   LV e' medial:    6.26 cm/s LV PW:         1.40 cm   LV E/e' medial:  20.0 LV IVS:        1.40 cm   LV e' lateral:   9.50 cm/s LVOT diam:     1.90 cm   LV E/e' lateral: 13.2 LV SV:         99 LV SV Index:   49 LVOT Area:     2.84 cm   RIGHT VENTRICLE             IVC RV Basal diam:  4.80 cm     IVC diam: 1.10 cm RV S prime:     10.90 cm/s TAPSE (M-mode): 2.5 cm  LEFT ATRIUM             Index        RIGHT  ATRIUM           Index LA diam:        5.40 cm 2.68 cm/m   RA Area:     19.10 cm LA Vol (A2C):   84.2 ml 41.82 ml/m  RA Volume:   60.60 ml  30.09 ml/m LA Vol (A4C):   74.6 ml 37.05 ml/m LA Biplane Vol: 81.3 ml 40.37 ml/m AORTIC VALVE AV Area (Vmax):    1.30 cm AV Area (Vmean):   1.26 cm AV Area (VTI):     1.34 cm AV Vmax:           292.40 cm/s AV Vmean:          204.400 cm/s AV VTI:            0.735 m AV Peak Grad:      34.2 mmHg AV Mean Grad:      19.6 mmHg LVOT Vmax:         134.50 cm/s LVOT Vmean:        90.850 cm/s LVOT VTI:          0.348 m LVOT/AV VTI ratio: 0.47 AI PHT:            506 msec  AORTA Ao Root diam: 3.50 cm Ao Asc diam:  4.10 cm  MITRAL VALVE MV Area (PHT): 2.68 cm     SHUNTS MV Decel Time: 284 msec     Systemic VTI:  0.35 m MV E velocity: 125.00 cm/s  Systemic Diam: 1.90 cm MV A velocity: 131.00 cm/s MV E/A ratio:  0.95  Olga Millers MD Electronically signed by Olga Millers MD Signature Date/Time: 08/27/2022/2:51:31 PM    Final          ______________________________________________________________________________________________      Physical Exam:    VS:  BP (!) 190/82 (BP Location: Left Arm)   Pulse (!) 54   Ht 5\' 6"  (1.676 m)   Wt 211 lb (95.7 kg)   SpO2 95%   BMI 34.06 kg/m    Wt Readings from Last 3 Encounters:  06/24/23 211 lb (95.7 kg)  11/05/22 196 lb 3.2 oz (89 kg)  10/15/22 201 lb (91.2 kg)    Gen: no distress  Neck: No JVD, bilateral Frank's Sign Cardiac: No Rubs or Gallops, systolic murmur with nearly absent A2, distant heart sounds, regularly bradycardia, +2 radial pulses Respiratory: Clear to auscultation bilaterally, normal effort, normal  respiratory rate GI: Soft, nontender, non-distended  MS: No  edema;  moves all extremities Integument: Skin feels warm Neuro:  At time of evaluation, alert and oriented to person/place/time/situation  Psych: Normal affect, patient feels ok   ASSESSMENT AND PLAN:  .    Hypertensive  Urgency Hypertensive urgency due to non-adherence to antihypertensive medications. Blood pressure control is critical for elective knee surgery. Discussed the importance of medication adherence and potential consequences of uncontrolled hypertension, including the inability to proceed with elective surgery due to high blood pressure.  - Start Diovan HCT - Order BMP in one week  - If BP is still elevated, restart amlodipine 10 mg   Aortic Stenosis  LVH Suspected severe aortic stenosis, possibly due to a bicuspid aortic valve. Previous echocardiogram showed moderate stenosis. Current physical exam suggests worsening condition. Discussed the potential need for valve intervention if severe stenosis is confirmed. Explained that severe aortic stenosis would delay knee surgery and require valve intervention first. - Order repeat echocardiogram  - Evaluate blood pressure control  - If severe aortic stenosis is confirmed, refer to a specialist for valve intervention; he is not a straight forward SAVR candidate  Peripheral Arterial Disease  Minimal peripheral arterial disease noted on recent evaluation. No significant symptoms reported  Hyperlipidemia  Hyperlipidemia managed with Zetia due to statin intolerance.  - Restart Zetia   Sinus Bradycardia with 1st HB - continue propranolol  General Health Maintenance Discussed the importance of medication adherence and regular follow-up for chronic conditions.  - Encourage medication adherence  - Schedule follow-up with Tessa in three months   Follow-up  - Order BMP in one week  - Order repeat echocardiogram  - Schedule follow-up with Tessa in three months.   If his blood pressure is controlled and he does not have severe aortic stenosis, given his functional METS of ~ 4-5 he would be reasonable for elective surgery   Riley Lam, MD FASE [facility] Cardiologist [facility]  [address],  [address] [place], [place] [address] [phone]  8:53 AM

## 2023-06-27 DIAGNOSIS — I10 Essential (primary) hypertension: Secondary | ICD-10-CM | POA: Diagnosis not present

## 2023-07-06 ENCOUNTER — Telehealth: Payer: Self-pay

## 2023-07-06 NOTE — Telephone Encounter (Signed)
 Error this pt was seen by Dr Izora Ribas on 06/24/2023 for further evaluation.

## 2023-07-06 NOTE — Telephone Encounter (Deleted)
-----   Message from Christell Constant sent at 06/19/2023  8:05 PM EST ----- Regarding: RE: Blood Pressure Hey has a history of medication non-adherence (see prior notes).   I recommend the addition of  hydrochlorothiazide 12.5 mg (to increase his diuretic dose.  He needs a follow up visit with clinical cardiology in a couple of weeks for hypertensive urgency. ----- Message ----- From: Elliot Gault, RVT Sent: 06/16/2023  11:20 AM EST To: Sharlene Dory, PA-C; # Subject: Blood Pressure                                 Hi,  This patient who has been seen by all three of you came in today for an ABI from an outside referral.   When I started the exam each arm was at 230 (systole).   I spoke to the DOD at the end of exam (Dr. Tresa Endo) and retook the pressures. Right 198/74, left 191/68. Patient had taken his blood pressure medicine about 5 hours before.  Dr. Tresa Endo had asked I let you know that his pressure was elevated.  Thanks, Alecia  [facility]

## 2023-07-18 ENCOUNTER — Ambulatory Visit (HOSPITAL_COMMUNITY): Payer: Medicare Other

## 2023-08-09 ENCOUNTER — Ambulatory Visit (HOSPITAL_COMMUNITY): Attending: Internal Medicine

## 2023-08-09 DIAGNOSIS — E785 Hyperlipidemia, unspecified: Secondary | ICD-10-CM | POA: Diagnosis not present

## 2023-08-09 DIAGNOSIS — I517 Cardiomegaly: Secondary | ICD-10-CM | POA: Diagnosis not present

## 2023-08-09 DIAGNOSIS — I739 Peripheral vascular disease, unspecified: Secondary | ICD-10-CM | POA: Insufficient documentation

## 2023-08-09 DIAGNOSIS — Z01818 Encounter for other preprocedural examination: Secondary | ICD-10-CM | POA: Diagnosis not present

## 2023-08-09 DIAGNOSIS — Z01810 Encounter for preprocedural cardiovascular examination: Secondary | ICD-10-CM

## 2023-08-09 LAB — ECHOCARDIOGRAM COMPLETE
AR max vel: 1.33 cm2
AV Area VTI: 1.49 cm2
AV Area mean vel: 1.28 cm2
AV Mean grad: 20.5 mmHg
AV Peak grad: 37.6 mmHg
Ao pk vel: 3.07 m/s
Area-P 1/2: 1.6 cm2
S' Lateral: 3.3 cm

## 2023-08-10 ENCOUNTER — Encounter: Payer: Self-pay | Admitting: Internal Medicine

## 2023-08-10 DIAGNOSIS — I160 Hypertensive urgency: Secondary | ICD-10-CM

## 2023-08-10 MED ORDER — AMLODIPINE BESYLATE 10 MG PO TABS: 10.0000 mg | ORAL_TABLET | Freq: Every day | ORAL | 3 refills | Status: DC

## 2023-08-10 NOTE — Telephone Encounter (Signed)
-----   Message from Christell Constant sent at 08/10/2023  8:53 AM EDT ----- Results: Tri-leaflet aortic valve; calcified with moderate AS Degenerative calcification of the mitral valve Moderate, concentric LVH related to AS and HTN Hypertensive urgency Plan: - repeat echo in one year - confirm compliance with medications -- if compliance, start aldactone 25 mg PO daily and BMP in 2 weeks -- if non adherence, refer to health care coaching.  Christell Constant, MD

## 2023-08-10 NOTE — Telephone Encounter (Signed)
 The patient has been notified of the result and verbalized understanding.  All questions (if any) were answered. Kariel Skillman N Farran Amsden, RN 08/10/2023 12:09 PM   Pt reports BP runs 180/80.  Reviewed BP meds pt reports is taking propranolol 20 mg PO BID and valsartan-hydrochlorothiazide 160-12.5 mg PO every day is not taking amlodipine 10 mg reports doesn't have any pills left.  Request a refill of medication; sent to pharmacy of pt choice. Amlodipine listed as an allergy pt reports doesn't recall any side effects will take medication and notify our office if develops.   Advised pt have placed a referral for Health coach to assist with medication compliance pt is agreeable to services.  All questions answered.

## 2023-08-11 ENCOUNTER — Telehealth: Payer: Self-pay

## 2023-08-11 DIAGNOSIS — Z0000 Encounter for general adult medical examination without abnormal findings: Secondary | ICD-10-CM

## 2023-08-11 NOTE — Telephone Encounter (Signed)
 Called patient per health coaching referral from Dr. Izora Ribas regarding medication compliance. Patient stated that he has since been taking his medications and that the problem was not having them. Inquired if patient was having financial challenges with getting his medications. Patient stated no and that he was fine. Offered patient to assist with health coaching to help him with being consistent with taking his medications. Patient declined services.   Renaee Munda, MS, ERHD, [facility]  Care Guide [facility] Care Navigation Telephone: [phone] Email: [email or web address]

## 2023-09-06 DIAGNOSIS — M170 Bilateral primary osteoarthritis of knee: Secondary | ICD-10-CM | POA: Diagnosis not present

## 2023-09-21 ENCOUNTER — Ambulatory Visit: Payer: Medicare Other | Admitting: Physician Assistant

## 2023-09-21 ENCOUNTER — Ambulatory Visit: Attending: Physician Assistant | Admitting: Physician Assistant

## 2023-09-21 ENCOUNTER — Encounter: Payer: Self-pay | Admitting: Physician Assistant

## 2023-09-21 VITALS — BP 100/50 | HR 50 | Ht 66.0 in | Wt 208.4 lb

## 2023-09-21 DIAGNOSIS — E7800 Pure hypercholesterolemia, unspecified: Secondary | ICD-10-CM | POA: Diagnosis not present

## 2023-09-21 DIAGNOSIS — I119 Hypertensive heart disease without heart failure: Secondary | ICD-10-CM | POA: Diagnosis not present

## 2023-09-21 DIAGNOSIS — I350 Nonrheumatic aortic (valve) stenosis: Secondary | ICD-10-CM | POA: Insufficient documentation

## 2023-09-21 DIAGNOSIS — Z01810 Encounter for preprocedural cardiovascular examination: Secondary | ICD-10-CM

## 2023-09-21 MED ORDER — AMLODIPINE BESYLATE 5 MG PO TABS: 5.0000 mg | ORAL_TABLET | Freq: Every day | ORAL | 3 refills | Status: DC

## 2023-09-21 NOTE — Progress Notes (Signed)
 Cardiology Office Note:    Date:  09/21/2023  ID:  [name] [name], DOB 1950-12-24, MRN [id number] PCP: Candiss Chamorro, MD  [facility] Providers Cardiologist:  Jann Melody, MD       Patient Profile:      HFimpEF (heart failure with improved ejection fraction) Lexiscan  MPI 08/10/22: no ischemia, low risk  Aortic stenosis TTE 08/27/22: mod AS (mean 19.6 mmHg, Vmax 292.4 cm/s, DI 0.47) TTE 08/09/23: EF 60-65, no RWMA, mod LVH, Gr 1 DD, NL RVSF, mod LAE, mild MR, tricuspid AV, mild AI, mod AS (mean 20.5 mmHg, Vmax 306.5 cm/s, DI 0.43) Sinus bradycardia  Peripheral arterial disease  ABIs 06/16/23: R 0.96, L 0.88 Hypertension  Hyperlipidemia  Intol of statins Alcoholic cirrhosis            Discussed the use of AI scribe software for clinical note transcription with the patient, who gave verbal consent to proceed.  History of Present Illness [name] [name] is a 73 y.o. male  He was last seen by Dr. Paulita Boss in 06/2023 for preop eval prior to knee surgery. Pt had been non-adherent to medications. He was started on Valsartan /hydrochlorothiazide . Ezetimibe  was restarted for hyperlipidemia. Echocardiogram was ordered to re-evaluate aortic stenosis.  He is here with his friend. He experiences occasional pain on the left side of his neck, which he describes as possibly nerve-related. This pain has been present for about six months and occurs with certain movements, such as turning his head or sitting in a chair. No chest pain, pressure, shortness of breath, or leg swelling. No syncope, near syncope, orthopnea, leg edema. He can walk on level ground and manage stairs, albeit slowly. He performs household activities such as doing Pharmacologist and using a dishwasher but does not vacuum or sweep. He quit smoking a long time ago.    ROS-See HPI       Studies Reviewed:        Risk Assessment/Calculations:             Physical Exam:   VS:  BP (!) 100/50   Pulse (!) 50    Ht 5\' 6"  (1.676 m)   Wt 208 lb 6.4 oz (94.5 kg)   SpO2 97%   BMI 33.64 kg/m    Wt Readings from Last 3 Encounters:  09/21/23 208 lb 6.4 oz (94.5 kg)  06/24/23 211 lb (95.7 kg)  11/05/22 196 lb 3.2 oz (89 kg)    Constitutional:      Appearance: Healthy appearance. Not in distress.  Neck:     Vascular: JVD normal.  Pulmonary:     Breath sounds: Normal breath sounds. No wheezing. No rales.  Cardiovascular:     Bradycardia present. Regular rhythm.     Murmurs: There is a grade 2/6 crescendo-decrescendo systolic murmur at the URSB.  Edema:    Peripheral edema absent.  Abdominal:     Palpations: Abdomen is soft.        Assessment and Plan:   Assessment & Plan Hypertensive heart disease without heart failure Blood pressure has previously been significantly elevated due to poor adherence. BP is now low, likely due to adherence to medications. BP is equal in both arms. He is not symptomatic.  - Reduce amlodipine  to 5 mg daily. - Continue valsartan  HCTZ 160/12.5 mg daily and Inderal  20 mg twice daily. - Obtain basic metabolic panel (BMET) today to check kidney function and potassium levels. - Advise home blood pressure monitoring and report significant  increases. Nonrheumatic aortic (valve) stenosis Moderate aortic stenosis confirmed by echocardiogram on August 09, 2023, with a mean gradient of 20.5 mmHg and a Vmax of 306.5 cm/s. The dimensionless index (DI) is 0.43. No current symptoms suggestive of worsening aortic stenosis or need for immediate intervention.   - He will need follow-up echocardiogram in April 2026. Preoperative cardiovascular examination Mr. Weyrauch's perioperative risk of a major cardiac event is 0.9% according to the Revised Cardiac Risk Index (RCRI).  Therefore, he is at low risk for perioperative complications.   His functional capacity is fair at 4.4 METs according to the Duke Activity Status Index (DASI). Recommendations: According to ACC/AHA guidelines, no further  cardiovascular testing needed.  The patient may proceed to surgery at acceptable risk.           Dispo:  Return in about 6 months (around 03/23/2024) for Routine Follow Up, w/ Dr. Paulita Boss, or Marlyse Single, PA-C.  Signed, Marlyse Single, PA-C

## 2023-09-21 NOTE — Assessment & Plan Note (Signed)
 Blood pressure has previously been significantly elevated due to poor adherence. BP is now low, likely due to adherence to medications. BP is equal in both arms. He is not symptomatic.  - Reduce amlodipine  to 5 mg daily. - Continue valsartan  HCTZ 160/12.5 mg daily and Inderal  20 mg twice daily. - Obtain basic metabolic panel (BMET) today to check kidney function and potassium levels. - Advise home blood pressure monitoring and report significant increases.

## 2023-09-21 NOTE — Assessment & Plan Note (Signed)
 Moderate aortic stenosis confirmed by echocardiogram on August 09, 2023, with a mean gradient of 20.5 mmHg and a Vmax of 306.5 cm/s. The dimensionless index (DI) is 0.43. No current symptoms suggestive of worsening aortic stenosis or need for immediate intervention.   - He will need follow-up echocardiogram in April 2026.

## 2023-09-21 NOTE — Patient Instructions (Signed)
 Medication Instructions:  Decrease Amlodipine  to 5 mg once daily  You can cut the Amlodipine  10 mg tablet in half to get 5 mg A prescription was sent in for the new dose  *If you need a refill on your cardiac medications before your next appointment, please call your pharmacy*  Lab Work: Today - BMET If you have labs (blood work) drawn today and your tests are completely normal, you will receive your results only by: MyChart Message (if you have MyChart) OR A paper copy in the mail If you have any lab test that is abnormal or we need to change your treatment, we will call you to review the results.  Testing/Procedures: None ordered today.  Follow-Up: At [facility], you and your health needs are our priority.  As part of our continuing mission to provide you with exceptional heart care, our providers are all part of one team.  This team includes your primary Cardiologist (physician) and Advanced Practice Providers or APPs (Physician Assistants and Nurse Practitioners) who all work together to provide you with the care you need, when you need it.  Your next appointment:   6 month(s)  Provider:   Jann Melody, MD or Marlyse Single, PA-C          We recommend signing up for the patient portal called "MyChart".  Sign up information is provided on this After Visit Summary.  MyChart is used to connect with patients for Virtual Visits (Telemedicine).  Patients are able to view lab/test results, encounter notes, upcoming appointments, etc.  Non-urgent messages can be sent to your provider as well.   To learn more about what you can do with MyChart, go to [email or web address].   Other Instructions If blood pressure is greater than 140/90 more than twice in a row, call us .

## 2023-09-22 ENCOUNTER — Ambulatory Visit: Payer: Self-pay | Admitting: Physician Assistant

## 2023-09-22 ENCOUNTER — Other Ambulatory Visit: Payer: Self-pay | Admitting: *Deleted

## 2023-09-22 DIAGNOSIS — E875 Hyperkalemia: Secondary | ICD-10-CM

## 2023-09-22 DIAGNOSIS — I119 Hypertensive heart disease without heart failure: Secondary | ICD-10-CM

## 2023-09-22 DIAGNOSIS — N179 Acute kidney failure, unspecified: Secondary | ICD-10-CM

## 2023-09-22 LAB — BASIC METABOLIC PANEL WITH GFR
BUN/Creatinine Ratio: 21 (ref 10–24)
BUN: 44 mg/dL — ABNORMAL HIGH (ref 8–27)
CO2: 19 mmol/L — ABNORMAL LOW (ref 20–29)
Calcium: 8.7 mg/dL (ref 8.6–10.2)
Chloride: 101 mmol/L (ref 96–106)
Creatinine, Ser: 2.14 mg/dL — ABNORMAL HIGH (ref 0.76–1.27)
Glucose: 105 mg/dL — ABNORMAL HIGH (ref 70–99)
Potassium: 5.2 mmol/L (ref 3.5–5.2)
Sodium: 135 mmol/L (ref 134–144)
eGFR: 32 mL/min/{1.73_m2} — ABNORMAL LOW (ref >59)

## 2023-09-29 ENCOUNTER — Other Ambulatory Visit: Payer: Self-pay | Admitting: *Deleted

## 2023-09-29 DIAGNOSIS — N179 Acute kidney failure, unspecified: Secondary | ICD-10-CM | POA: Diagnosis not present

## 2023-09-29 DIAGNOSIS — I119 Hypertensive heart disease without heart failure: Secondary | ICD-10-CM | POA: Diagnosis not present

## 2023-09-30 ENCOUNTER — Other Ambulatory Visit: Payer: Self-pay | Admitting: *Deleted

## 2023-09-30 ENCOUNTER — Telehealth: Payer: Self-pay | Admitting: Cardiology

## 2023-09-30 DIAGNOSIS — I119 Hypertensive heart disease without heart failure: Secondary | ICD-10-CM

## 2023-09-30 DIAGNOSIS — E875 Hyperkalemia: Secondary | ICD-10-CM

## 2023-09-30 DIAGNOSIS — N179 Acute kidney failure, unspecified: Secondary | ICD-10-CM

## 2023-09-30 LAB — BASIC METABOLIC PANEL WITH GFR
BUN/Creatinine Ratio: 18 (ref 10–24)
BUN: 23 mg/dL (ref 8–27)
CO2: 17 mmol/L — ABNORMAL LOW (ref 20–29)
Calcium: 8.5 mg/dL — ABNORMAL LOW (ref 8.6–10.2)
Chloride: 106 mmol/L (ref 96–106)
Creatinine, Ser: 1.29 mg/dL — ABNORMAL HIGH (ref 0.76–1.27)
Glucose: 111 mg/dL — ABNORMAL HIGH (ref 70–99)
Potassium: 5.8 mmol/L (ref 3.5–5.2)
Sodium: 138 mmol/L (ref 134–144)
eGFR: 59 mL/min/{1.73_m2} — ABNORMAL LOW (ref >59)

## 2023-09-30 LAB — POTASSIUM, HEPARIN PLASMA: Potassium, Heparin Plasma: 5.3 mmol/L — ABNORMAL HIGH (ref 3.5–5.2)

## 2023-09-30 NOTE — Telephone Encounter (Signed)
 Received page from [facility] regarding critical result: K 5.8 (no hemolysis noted)  Called patient to discuss, he hadn't gone to sleep yet. He notes that he feels okay. No SOB, CP, palpitations, syncope. We discussed his K of 5.8 without corresponding AKI (Cr stable at 1.2). Recommended presentation to the ED when possible to workup further and to give medications to bring K down. He voiced understanding and said he would find a way to get to the ED. Will FYI his primary cardiology team Dr. Paulita Boss and Marlyse Single on this message as well.  Aubrey Leaf, MD Cardiology 09/30/2023

## 2023-09-30 NOTE — Telephone Encounter (Signed)
 [place], Peter M, MD  P [facility] Triage; Marlyse Single T, PA-C Caller: Unspecified (Today,  3:07 PM) No change. Potassium is better  Peter [place] MD, [facility]   The patient has been notified of the result and verbalized understanding.  All questions (if any) were answered.

## 2023-09-30 NOTE — Telephone Encounter (Signed)
 Suspect hemolyzed. Several patients have had high K+ recently with normal repeats. Will repeat K+ today as OP. Marlyse Single, PA-C    09/30/2023 8:00 AM

## 2023-09-30 NOTE — Telephone Encounter (Signed)
 [facility] called with Critical K=5.3. Was 5.8 on 09/29/23. Will forward to [place], [place] and DOD for further recommendations.

## 2023-10-03 ENCOUNTER — Other Ambulatory Visit: Payer: Self-pay | Admitting: *Deleted

## 2023-10-03 DIAGNOSIS — E875 Hyperkalemia: Secondary | ICD-10-CM

## 2023-10-08 LAB — BASIC METABOLIC PANEL WITH GFR
BUN/Creatinine Ratio: 19 (ref 10–24)
BUN: 22 mg/dL (ref 8–27)
CO2: 19 mmol/L — ABNORMAL LOW (ref 20–29)
Calcium: 8.4 mg/dL — ABNORMAL LOW (ref 8.6–10.2)
Chloride: 105 mmol/L (ref 96–106)
Creatinine, Ser: 1.13 mg/dL (ref 0.76–1.27)
Glucose: 105 mg/dL — ABNORMAL HIGH (ref 70–99)
Potassium: 5.1 mmol/L (ref 3.5–5.2)
Sodium: 137 mmol/L (ref 134–144)
eGFR: 69 mL/min/{1.73_m2} (ref >59)

## 2023-10-10 NOTE — Addendum Note (Signed)
 Addended byReyne Cave, Geralyn Knee T on: 10/10/2023 10:28 AM   Modules accepted: Orders

## 2023-11-28 ENCOUNTER — Other Ambulatory Visit: Payer: Self-pay

## 2023-11-28 DIAGNOSIS — I739 Peripheral vascular disease, unspecified: Secondary | ICD-10-CM

## 2023-12-15 ENCOUNTER — Ambulatory Visit (HOSPITAL_COMMUNITY)

## 2023-12-15 ENCOUNTER — Ambulatory Visit (HOSPITAL_COMMUNITY): Admitting: Vascular Surgery

## 2024-01-20 LAB — LAB REPORT - SCANNED
A1c: 5.9
EGFR: 30

## 2024-01-31 ENCOUNTER — Ambulatory Visit: Admitting: Vascular Surgery

## 2024-01-31 ENCOUNTER — Ambulatory Visit (HOSPITAL_COMMUNITY)

## 2024-02-29 ENCOUNTER — Ambulatory Visit: Admitting: Vascular Surgery

## 2024-02-29 ENCOUNTER — Ambulatory Visit (HOSPITAL_COMMUNITY)

## 2024-03-03 ENCOUNTER — Emergency Department (HOSPITAL_COMMUNITY)

## 2024-03-03 ENCOUNTER — Inpatient Hospital Stay (HOSPITAL_COMMUNITY)

## 2024-03-03 ENCOUNTER — Inpatient Hospital Stay (HOSPITAL_COMMUNITY)
Admission: EM | Admit: 2024-03-03 | Discharge: 2024-03-14 | DRG: 981 | Disposition: A | Discharge status: Skilled Nursing Facility | Attending: Student in an Organized Health Care Education/Training Program | Admitting: Student in an Organized Health Care Education/Training Program

## 2024-03-03 ENCOUNTER — Other Ambulatory Visit: Payer: Self-pay

## 2024-03-03 DIAGNOSIS — E785 Hyperlipidemia, unspecified: Secondary | ICD-10-CM | POA: Diagnosis present

## 2024-03-03 DIAGNOSIS — Z6831 Body mass index (BMI) 31.0-31.9, adult: Secondary | ICD-10-CM

## 2024-03-03 DIAGNOSIS — Y92009 Unspecified place in unspecified non-institutional (private) residence as the place of occurrence of the external cause: Secondary | ICD-10-CM

## 2024-03-03 DIAGNOSIS — I350 Nonrheumatic aortic (valve) stenosis: Secondary | ICD-10-CM | POA: Diagnosis present

## 2024-03-03 DIAGNOSIS — G8191 Hemiplegia, unspecified affecting right dominant side: Secondary | ICD-10-CM | POA: Diagnosis present

## 2024-03-03 DIAGNOSIS — J3801 Paralysis of vocal cords and larynx, unilateral: Secondary | ICD-10-CM | POA: Diagnosis present

## 2024-03-03 DIAGNOSIS — I6381 Other cerebral infarction due to occlusion or stenosis of small artery: Principal | ICD-10-CM | POA: Diagnosis present

## 2024-03-03 DIAGNOSIS — R471 Dysarthria and anarthria: Secondary | ICD-10-CM | POA: Diagnosis present

## 2024-03-03 DIAGNOSIS — F1210 Cannabis abuse, uncomplicated: Secondary | ICD-10-CM | POA: Diagnosis present

## 2024-03-03 DIAGNOSIS — W1830XA Fall on same level, unspecified, initial encounter: Secondary | ICD-10-CM | POA: Diagnosis present

## 2024-03-03 DIAGNOSIS — D649 Anemia, unspecified: Secondary | ICD-10-CM | POA: Diagnosis present

## 2024-03-03 DIAGNOSIS — S2249XA Multiple fractures of ribs, unspecified side, initial encounter for closed fracture: Secondary | ICD-10-CM

## 2024-03-03 DIAGNOSIS — H518 Other specified disorders of binocular movement: Secondary | ICD-10-CM | POA: Diagnosis present

## 2024-03-03 DIAGNOSIS — I4892 Unspecified atrial flutter: Secondary | ICD-10-CM | POA: Diagnosis present

## 2024-03-03 DIAGNOSIS — S2241XA Multiple fractures of ribs, right side, initial encounter for closed fracture: Secondary | ICD-10-CM | POA: Diagnosis present

## 2024-03-03 DIAGNOSIS — I110 Hypertensive heart disease with heart failure: Secondary | ICD-10-CM | POA: Diagnosis present

## 2024-03-03 DIAGNOSIS — F1021 Alcohol dependence, in remission: Secondary | ICD-10-CM | POA: Diagnosis not present

## 2024-03-03 DIAGNOSIS — N179 Acute kidney failure, unspecified: Secondary | ICD-10-CM | POA: Diagnosis present

## 2024-03-03 DIAGNOSIS — M4856XA Collapsed vertebra, not elsewhere classified, lumbar region, initial encounter for fracture: Secondary | ICD-10-CM | POA: Diagnosis present

## 2024-03-03 DIAGNOSIS — I5020 Unspecified systolic (congestive) heart failure: Secondary | ICD-10-CM | POA: Diagnosis not present

## 2024-03-03 DIAGNOSIS — Z9049 Acquired absence of other specified parts of digestive tract: Secondary | ICD-10-CM

## 2024-03-03 DIAGNOSIS — R4701 Aphasia: Secondary | ICD-10-CM | POA: Diagnosis present

## 2024-03-03 DIAGNOSIS — G928 Other toxic encephalopathy: Secondary | ICD-10-CM | POA: Diagnosis not present

## 2024-03-03 DIAGNOSIS — K7030 Alcoholic cirrhosis of liver without ascites: Secondary | ICD-10-CM | POA: Diagnosis present

## 2024-03-03 DIAGNOSIS — Z7901 Long term (current) use of anticoagulants: Secondary | ICD-10-CM

## 2024-03-03 DIAGNOSIS — I6389 Other cerebral infarction: Secondary | ICD-10-CM | POA: Diagnosis not present

## 2024-03-03 DIAGNOSIS — Z91199 Patient's noncompliance with other medical treatment and regimen due to unspecified reason: Secondary | ICD-10-CM

## 2024-03-03 DIAGNOSIS — I639 Cerebral infarction, unspecified: Secondary | ICD-10-CM | POA: Diagnosis present

## 2024-03-03 DIAGNOSIS — D72829 Elevated white blood cell count, unspecified: Secondary | ICD-10-CM | POA: Diagnosis not present

## 2024-03-03 DIAGNOSIS — G4733 Obstructive sleep apnea (adult) (pediatric): Secondary | ICD-10-CM | POA: Diagnosis not present

## 2024-03-03 DIAGNOSIS — R1310 Dysphagia, unspecified: Secondary | ICD-10-CM | POA: Diagnosis present

## 2024-03-03 DIAGNOSIS — Z91040 Latex allergy status: Secondary | ICD-10-CM

## 2024-03-03 DIAGNOSIS — I5032 Chronic diastolic (congestive) heart failure: Secondary | ICD-10-CM | POA: Diagnosis present

## 2024-03-03 DIAGNOSIS — F1010 Alcohol abuse, uncomplicated: Secondary | ICD-10-CM | POA: Diagnosis present

## 2024-03-03 DIAGNOSIS — H9190 Unspecified hearing loss, unspecified ear: Secondary | ICD-10-CM | POA: Diagnosis present

## 2024-03-03 DIAGNOSIS — K7460 Unspecified cirrhosis of liver: Secondary | ICD-10-CM

## 2024-03-03 DIAGNOSIS — I69391 Dysphagia following cerebral infarction: Secondary | ICD-10-CM | POA: Diagnosis not present

## 2024-03-03 DIAGNOSIS — I10 Essential (primary) hypertension: Secondary | ICD-10-CM

## 2024-03-03 DIAGNOSIS — R29708 NIHSS score 8: Secondary | ICD-10-CM | POA: Diagnosis not present

## 2024-03-03 DIAGNOSIS — S72141A Displaced intertrochanteric fracture of right femur, initial encounter for closed fracture: Secondary | ICD-10-CM | POA: Diagnosis present

## 2024-03-03 DIAGNOSIS — Z880 Allergy status to penicillin: Secondary | ICD-10-CM

## 2024-03-03 DIAGNOSIS — I6340 Cerebral infarction due to embolism of unspecified cerebral artery: Secondary | ICD-10-CM | POA: Diagnosis not present

## 2024-03-03 DIAGNOSIS — R410 Disorientation, unspecified: Secondary | ICD-10-CM | POA: Diagnosis not present

## 2024-03-03 DIAGNOSIS — I2510 Atherosclerotic heart disease of native coronary artery without angina pectoris: Secondary | ICD-10-CM | POA: Diagnosis present

## 2024-03-03 DIAGNOSIS — I480 Paroxysmal atrial fibrillation: Secondary | ICD-10-CM | POA: Diagnosis present

## 2024-03-03 DIAGNOSIS — R29718 NIHSS score 18: Secondary | ICD-10-CM | POA: Diagnosis present

## 2024-03-03 DIAGNOSIS — I700 Atherosclerosis of aorta: Secondary | ICD-10-CM | POA: Diagnosis present

## 2024-03-03 DIAGNOSIS — Z888 Allergy status to other drugs, medicaments and biological substances status: Secondary | ICD-10-CM

## 2024-03-03 DIAGNOSIS — Z881 Allergy status to other antibiotic agents status: Secondary | ICD-10-CM

## 2024-03-03 DIAGNOSIS — S0191XA Laceration without foreign body of unspecified part of head, initial encounter: Secondary | ICD-10-CM | POA: Diagnosis present

## 2024-03-03 DIAGNOSIS — R29703 NIHSS score 3: Secondary | ICD-10-CM | POA: Diagnosis not present

## 2024-03-03 DIAGNOSIS — S72001A Fracture of unspecified part of neck of right femur, initial encounter for closed fracture: Secondary | ICD-10-CM | POA: Diagnosis not present

## 2024-03-03 DIAGNOSIS — Z79899 Other long term (current) drug therapy: Secondary | ICD-10-CM

## 2024-03-03 DIAGNOSIS — K219 Gastro-esophageal reflux disease without esophagitis: Secondary | ICD-10-CM | POA: Diagnosis present

## 2024-03-03 DIAGNOSIS — M25561 Pain in right knee: Secondary | ICD-10-CM | POA: Diagnosis present

## 2024-03-03 DIAGNOSIS — Z91041 Radiographic dye allergy status: Secondary | ICD-10-CM

## 2024-03-03 DIAGNOSIS — Z8249 Family history of ischemic heart disease and other diseases of the circulatory system: Secondary | ICD-10-CM

## 2024-03-03 DIAGNOSIS — K5730 Diverticulosis of large intestine without perforation or abscess without bleeding: Secondary | ICD-10-CM | POA: Diagnosis present

## 2024-03-03 DIAGNOSIS — Z01810 Encounter for preprocedural cardiovascular examination: Secondary | ICD-10-CM | POA: Diagnosis not present

## 2024-03-03 DIAGNOSIS — I498 Other specified cardiac arrhythmias: Secondary | ICD-10-CM | POA: Diagnosis not present

## 2024-03-03 DIAGNOSIS — E669 Obesity, unspecified: Secondary | ICD-10-CM | POA: Diagnosis present

## 2024-03-03 DIAGNOSIS — W19XXXA Unspecified fall, initial encounter: Secondary | ICD-10-CM

## 2024-03-03 DIAGNOSIS — I4719 Other supraventricular tachycardia: Secondary | ICD-10-CM | POA: Diagnosis present

## 2024-03-03 DIAGNOSIS — Z833 Family history of diabetes mellitus: Secondary | ICD-10-CM

## 2024-03-03 DIAGNOSIS — R29818 Other symptoms and signs involving the nervous system: Secondary | ICD-10-CM | POA: Diagnosis not present

## 2024-03-03 LAB — DIFFERENTIAL
Abs Immature Granulocytes: 0.13 K/uL — ABNORMAL HIGH (ref 0.00–0.07)
Basophils Absolute: 0.1 K/uL (ref 0.0–0.1)
Basophils Relative: 0 %
Eosinophils Absolute: 0 K/uL (ref 0.0–0.5)
Eosinophils Relative: 0 %
Immature Granulocytes: 1 %
Lymphocytes Relative: 5 %
Lymphs Abs: 1 K/uL (ref 0.7–4.0)
Monocytes Absolute: 1.1 K/uL — ABNORMAL HIGH (ref 0.1–1.0)
Monocytes Relative: 5 %
Neutro Abs: 19 K/uL — ABNORMAL HIGH (ref 1.7–7.7)
Neutrophils Relative %: 89 %

## 2024-03-03 LAB — COMPREHENSIVE METABOLIC PANEL WITH GFR
ALT: 22 U/L (ref 0–44)
AST: 36 U/L (ref 15–41)
Albumin: 3.9 g/dL (ref 3.5–5.0)
Alkaline Phosphatase: 98 U/L (ref 38–126)
Anion gap: 18 — ABNORMAL HIGH (ref 5–15)
BUN: 14 mg/dL (ref 8–23)
CO2: 18 mmol/L — ABNORMAL LOW (ref 22–32)
Calcium: 9 mg/dL (ref 8.9–10.3)
Chloride: 96 mmol/L — ABNORMAL LOW (ref 98–111)
Creatinine, Ser: 1.13 mg/dL (ref 0.61–1.24)
GFR, Estimated: >60 mL/min
Glucose, Bld: 218 mg/dL — ABNORMAL HIGH (ref 70–99)
Potassium: 4 mmol/L (ref 3.5–5.1)
Sodium: 132 mmol/L — ABNORMAL LOW (ref 135–145)
Total Bilirubin: 1.6 mg/dL — ABNORMAL HIGH (ref 0.0–1.2)
Total Protein: 7.7 g/dL (ref 6.5–8.1)

## 2024-03-03 LAB — I-STAT CHEM 8, ED
BUN: 17 mg/dL (ref 8–23)
Calcium, Ion: 0.97 mmol/L — ABNORMAL LOW (ref 1.15–1.40)
Chloride: 100 mmol/L (ref 98–111)
Creatinine, Ser: 1.1 mg/dL (ref 0.61–1.24)
Glucose, Bld: 212 mg/dL — ABNORMAL HIGH (ref 70–99)
HCT: 44 % (ref 39.0–52.0)
Hemoglobin: 15 g/dL (ref 13.0–17.0)
Potassium: 4 mmol/L (ref 3.5–5.1)
Sodium: 133 mmol/L — ABNORMAL LOW (ref 135–145)
TCO2: 21 mmol/L — ABNORMAL LOW (ref 22–32)

## 2024-03-03 LAB — CBC
HCT: 41 % (ref 39.0–52.0)
Hemoglobin: 13.5 g/dL (ref 13.0–17.0)
MCH: 31.5 pg (ref 26.0–34.0)
MCHC: 32.9 g/dL (ref 30.0–36.0)
MCV: 95.8 fL (ref 80.0–100.0)
Platelets: 361 K/uL (ref 150–400)
RBC: 4.28 MIL/uL (ref 4.22–5.81)
RDW: 13.3 % (ref 11.5–15.5)
WBC: 21.2 K/uL — ABNORMAL HIGH (ref 4.0–10.5)
nRBC: 0 % (ref 0.0–0.2)

## 2024-03-03 LAB — PROTIME-INR
INR: 1 (ref 0.8–1.2)
Prothrombin Time: 13.7 s (ref 11.4–15.2)

## 2024-03-03 LAB — CBG MONITORING, ED: Glucose-Capillary: 228 mg/dL — ABNORMAL HIGH (ref 70–99)

## 2024-03-03 LAB — ETHANOL: Alcohol, Ethyl (B): <15 mg/dL (ref <15)

## 2024-03-03 LAB — HEMOGLOBIN A1C
Hgb A1c MFr Bld: 5.3 % (ref 4.8–5.6)
Mean Plasma Glucose: 105.41 mg/dL

## 2024-03-03 LAB — APTT: aPTT: 27 s (ref 24–36)

## 2024-03-03 LAB — MRSA NEXT GEN BY PCR, NASAL: MRSA by PCR Next Gen: NOT DETECTED

## 2024-03-03 MED ORDER — FENTANYL CITRATE (PF) 50 MCG/ML IJ SOSY
50.0000 ug | PREFILLED_SYRINGE | Freq: Once | INTRAMUSCULAR | Status: AC
  Administered 2024-03-03: 50 ug via INTRAVENOUS
  Filled 2024-03-03: qty 1

## 2024-03-03 MED ORDER — ACETAMINOPHEN 325 MG PO TABS
650.0000 mg | ORAL_TABLET | ORAL | Status: DC | PRN
  Administered 2024-03-05 – 2024-03-09 (×3): 650 mg via ORAL
  Filled 2024-03-03 (×3): qty 2

## 2024-03-03 MED ORDER — ACETAMINOPHEN 650 MG RE SUPP: 650.0000 mg | RECTAL | Status: DC | PRN

## 2024-03-03 MED ORDER — IOHEXOL 350 MG/ML SOLN
75.0000 mL | Freq: Once | INTRAVENOUS | Status: AC | PRN
  Administered 2024-03-03: 75 mL via INTRAVENOUS

## 2024-03-03 MED ORDER — PANTOPRAZOLE SODIUM 40 MG IV SOLR
40.0000 mg | Freq: Every day | INTRAVENOUS | Status: DC
  Administered 2024-03-03 – 2024-03-06 (×4): 40 mg via INTRAVENOUS
  Filled 2024-03-03 (×4): qty 10

## 2024-03-03 MED ORDER — ORAL CARE MOUTH RINSE
15.0000 mL | OROMUCOSAL | Status: DC | PRN
  Administered 2024-03-04: 15 mL via OROMUCOSAL

## 2024-03-03 MED ORDER — TENECTEPLASE FOR STROKE
0.2500 mg/kg | PACK | Freq: Once | INTRAVENOUS | Status: AC
  Administered 2024-03-03: 22 mg via INTRAVENOUS
  Filled 2024-03-03: qty 10

## 2024-03-03 MED ORDER — LORAZEPAM 2 MG/ML IJ SOLN
INTRAMUSCULAR | Status: AC
  Filled 2024-03-03: qty 1

## 2024-03-03 MED ORDER — LORAZEPAM 2 MG/ML IJ SOLN
1.0000 mg | Freq: Once | INTRAMUSCULAR | Status: AC
  Administered 2024-03-03: 1 mg via INTRAVENOUS

## 2024-03-03 MED ORDER — SENNOSIDES-DOCUSATE SODIUM 8.6-50 MG PO TABS
1.0000 | ORAL_TABLET | Freq: Every evening | ORAL | Status: DC | PRN
Start: 2024-03-03 — End: 2024-03-14
  Administered 2024-03-06 – 2024-03-07 (×2): 1 via ORAL
  Filled 2024-03-03 (×2): qty 1

## 2024-03-03 MED ORDER — ACETAMINOPHEN 160 MG/5ML PO SOLN: 650.0000 mg | ORAL | Status: DC | PRN

## 2024-03-03 MED ORDER — LABETALOL HCL 5 MG/ML IV SOLN
10.0000 mg | INTRAVENOUS | Status: AC | PRN
  Administered 2024-03-03 (×2): 10 mg via INTRAVENOUS

## 2024-03-03 MED ORDER — STROKE: EARLY STAGES OF RECOVERY BOOK: Freq: Once | Status: AC

## 2024-03-03 MED ORDER — SODIUM CHLORIDE 0.9% FLUSH
3.0000 mL | Freq: Once | INTRAVENOUS | Status: AC
  Administered 2024-03-03: 3 mL via INTRAVENOUS

## 2024-03-03 MED ORDER — CLEVIDIPINE BUTYRATE 0.5 MG/ML IV EMUL
0.0000 mg/h | INTRAVENOUS | Status: DC
  Administered 2024-03-03: 2 mg/h via INTRAVENOUS
  Administered 2024-03-03: 16 mg/h via INTRAVENOUS
  Filled 2024-03-03: qty 100

## 2024-03-03 MED ORDER — FENTANYL CITRATE (PF) 50 MCG/ML IJ SOSY
50.0000 ug | PREFILLED_SYRINGE | INTRAMUSCULAR | Status: DC | PRN
Start: 2024-03-03 — End: 2024-03-14
  Administered 2024-03-03 – 2024-03-06 (×13): 50 ug via INTRAVENOUS
  Filled 2024-03-03 (×13): qty 1

## 2024-03-03 MED ORDER — SODIUM CHLORIDE 0.9 % IV SOLN: INTRAVENOUS | Status: AC

## 2024-03-03 NOTE — Plan of Care (Signed)
 Imaging of the chest after TNK revealed rib fractures.  No pneumothorax.  Pain management per ER

## 2024-03-03 NOTE — ED Provider Notes (Addendum)
 [place] EMERGENCY DEPARTMENT AT [facility] Provider Note   CSN: [id number] Arrival date & time: 03/03/24  1543     Patient presents with: Code Stroke   [name] [name] is a 73 y.o. male.   Level 5 caveat due to altered mental status.  Sounds like last known normal at noon about 4 hours ago.  Patient was talked to on the phone by somebody.  Later was found on the ground confused not moving right side is much.  Code stroke enacted in the field for aphasia right-sided weakness left-sided gaze.  He is not on any blood thinners.  History of alcohol use varices reflux.  The history is provided by the EMS personnel.       Prior to Admission medications   Medication Sig Start Date End Date Taking? Authorizing Provider  acetaminophen  (TYLENOL ) 325 MG tablet Take 1-2 tablets (325-650 mg total) by mouth every 6 (six) hours as needed for mild pain. 03/15/14   Love, Sharlet RAMAN, PA-C  amLODipine  (NORVASC ) 5 MG tablet Take 1 tablet (5 mg total) by mouth daily. 09/21/23   Lelon Hamilton T, PA-C  docusate (COLACE) 50 MG/5ML liquid Take 50 mg by mouth daily as needed for mild constipation.    [provider]  ezetimibe  (ZETIA ) 10 MG tablet Take 1 tablet (10 mg total) by mouth daily. 06/24/23   Santo Stanly LABOR, MD  Multiple Vitamin (MULITIVITAMIN WITH MINERALS) TABS Take 1 tablet by mouth daily. 07/27/11   Gabriel Noa, MD  propranolol  (INDERAL ) 20 MG tablet Take 1 tablet (20 mg total) by mouth 2 (two) times daily. 11/05/22   Lucien Orren SAILOR, PA-C    Allergies: Adhesive [tape]; Amlodipine ; Amoxicillin; Atenolol ; Celebrex [celecoxib]; Cephalosporins; Clindamycin/lincomycin; Dexon [dexamethasone  sodium phosphate ]; Doxazosin mesylate; Duraprep [antiseptic products, misc.]; Enalapril; Erythromycin; Hctz [hydrochlorothiazide ]; Iodine; Lipitor [atorvastatin]; Other; Toprol  xl [metoprolol  succinate]; Verapamil; Vioxx [rofecoxib]; Zocor [simvastatin]; and Latex    Review of  Systems  Updated Vital Signs BP (!) 148/83   Pulse (!) 110   Temp 98 F (36.7 C) (Axillary)   Resp (!) 23   Wt 89.8 kg   SpO2 92%   BMI 31.95 kg/m   Physical Exam Vitals and nursing note reviewed.  Constitutional:      General: He is in acute distress.     Appearance: He is well-developed. He is ill-appearing.  HENT:     Head: Normocephalic and atraumatic.  Eyes:     Conjunctiva/sclera: Conjunctivae normal.     Comments: Left-sided gaze preference  Cardiovascular:     Rate and Rhythm: Regular rhythm. Tachycardia present.     Pulses: Normal pulses.     Heart sounds: No murmur heard. Pulmonary:     Effort: Pulmonary effort is normal. No respiratory distress.     Breath sounds: Normal breath sounds.  Abdominal:     Palpations: Abdomen is soft.     Tenderness: There is no abdominal tenderness.  Musculoskeletal:        General: No swelling.     Cervical back: Neck supple.  Skin:    General: Skin is warm and dry.     Capillary Refill: Capillary refill takes less than 2 seconds.  Neurological:     Mental Status: He is alert.     Comments: Appears to be weak on the right side compared to the left aphasia seems to follow commands somewhat but seems agitated  Psychiatric:        Mood and Affect: Mood normal.     (  all labs ordered are listed, but only abnormal results are displayed) Labs Reviewed  CBC - Abnormal; Notable for the following components:      Result Value   WBC 21.2 (*)    All other components within normal limits  DIFFERENTIAL - Abnormal; Notable for the following components:   Neutro Abs 19.0 (*)    Monocytes Absolute 1.1 (*)    Abs Immature Granulocytes 0.13 (*)    All other components within normal limits  COMPREHENSIVE METABOLIC PANEL WITH GFR - Abnormal; Notable for the following components:   Sodium 132 (*)    Chloride 96 (*)    CO2 18 (*)    Glucose, Bld 218 (*)    Total Bilirubin 1.6 (*)    Anion gap 18 (*)    All other components within  normal limits  I-STAT CHEM 8, ED - Abnormal; Notable for the following components:   Sodium 133 (*)    Glucose, Bld 212 (*)    Calcium, Ion 0.97 (*)    TCO2 21 (*)    All other components within normal limits  CBG MONITORING, ED - Abnormal; Notable for the following components:   Glucose-Capillary 228 (*)    All other components within normal limits  PROTIME-INR  APTT  ETHANOL  HEMOGLOBIN A1C  RAPID URINE DRUG SCREEN, HOSP PERFORMED  URINALYSIS, ROUTINE W REFLEX MICROSCOPIC    EKG: EKG Interpretation Date/Time:  Saturday March 03 2024 16:42:29 EDT Ventricular Rate:  120 PR Interval:  87 QRS Duration:  120 QT Interval:  401 QTC Calculation: 567 R Axis:   -43  Text Interpretation: Ectopic atrial tachycardia, unifocal LVH with IVCD, LAD and secondary repol abnrm Confirmed by Ruthe Cornet [phone]) on 03/03/2024 4:56:33 PM  Radiology: [facility] Chest Portable 1 View Result Date: 03/03/2024 CLINICAL DATA:  Fall. EXAM: PORTABLE CHEST 1 VIEW COMPARISON:  06/30/2018. FINDINGS: The heart is enlarged and the mediastinal contour is within normal limits. Stable interstitial prominence is noted bilaterally. No consolidation, effusion, or pneumothorax is seen. There are fractures of the T2 through T4 ribs on the right. Old healed rib fractures are noted on the left. IMPRESSION: 1. Fractures of the T2 through T4 ribs on the right. 2. Cardiomegaly. Electronically Signed   By: Leita Birmingham M.D.   On: 03/03/2024 16:53   CT C-SPINE NO CHARGE Result Date: 03/03/2024 EXAM: CT CERVICAL SPINE WITH CONTRAST 03/03/2024 04:14:17 PM TECHNIQUE: CT of the cervical spine was performed with the administration of 75 mL of iohexol  (OMNIPAQUE ) 350 MG/ML injection. The cervical spine images were reconstructed from the concomitant CTA of the neck. Multiplanar reformatted images are provided for review. Automated exposure control, iterative reconstruction, and/or weight based adjustment of the mA/kV was utilized to reduce  the radiation dose to as low as reasonably achievable. COMPARISON: None available. CLINICAL HISTORY: FINDINGS: CERVICAL SPINE: BONES AND ALIGNMENT: No acute fracture or traumatic malalignment. DEGENERATIVE CHANGES: C5-C6 and C6-C7 disc space narrowing without spinal canal stenosis. Bulky right anterior osteophytes at the C5-C7 levels. Upper cervical predominant facet arthrosis. SOFT TISSUES: No prevertebral soft tissue swelling. IMPRESSION: 1. No acute cervical spinal abnormality. Electronically signed by: Franky Stanford MD 03/03/2024 04:26 PM EDT RP Workstation: [id number]   CT ANGIO HEAD NECK W WO CM (CODE STROKE) Result Date: 03/03/2024 EXAM: CTA Head and Neck with Intravenous Contrast. CT Head without Contrast. CLINICAL HISTORY: Neuro deficit, acute, stroke suspected. TECHNIQUE: Axial CTA images of the head and neck performed with intravenous contrast. MIP reconstructed images were created and  reviewed. Axial computed tomography images of the head/brain performed without intravenous contrast. Note: Per PQRS, the description of internal carotid artery percent stenosis, including 0 percent or normal exam, is based on [place] American Symptomatic Carotid Endarterectomy Trial (NASCET) criteria. Dose reduction technique was used including one or more of the following: automated exposure control, adjustment of mA and kV according to patient size, and/or iterative reconstruction. CONTRAST: Without and with; 75 mL iohexol  (OMNIPAQUE ) 350 MG/ML injection. COMPARISON: None provided. FINDINGS: CT HEAD: BRAIN: No acute intraparenchymal hemorrhage. No mass lesion. No CT evidence for acute territorial infarct. No midline shift or extra-axial collection. Intracranial images are mildly degraded by motion. VENTRICLES: No hydrocephalus. ORBITS: The orbits are unremarkable. SINUSES AND MASTOIDS: The paranasal sinuses and mastoid air cells are clear. CTA NECK: COMMON CAROTID ARTERIES: Mixed density atherosclerotic disease at both  carotid bifurcations without hemodynamically significant stenosis. No dissection or occlusion. INTERNAL CAROTID ARTERIES: Mixed density atherosclerotic disease at both carotid bifurcations without hemodynamically significant stenosis. No dissection or occlusion. VERTEBRAL ARTERIES: Codominant vertebral arteries. Mild narrowing of the right vertebral artery origin and minimal atherosclerotic calcification of both V4 segments. Vertebral arteries otherwise normal. No dissection or occlusion. CTA HEAD: ANTERIOR CEREBRAL ARTERIES: No significant stenosis. No occlusion. No aneurysm. MIDDLE CEREBRAL ARTERIES: No significant stenosis. No occlusion. No aneurysm. POSTERIOR CEREBRAL ARTERIES: No significant stenosis. No occlusion. No aneurysm. BASILAR ARTERY: No significant stenosis. No occlusion. No aneurysm. OTHER: Atherosclerotic calcification of the cavernous segments of both internal carotid arteries without high grade stenosis. SOFT TISSUES: No acute finding. No masses or lymphadenopathy. BONES: Calcific aortic atherosclerosis. No acute osseous abnormality. IMPRESSION: 1. No emergent large vessel occlusion. 2. Atherosclerotic disease at both carotid bifurcations without hemodynamically significant stenosis. Electronically signed by: Franky Stanford MD 03/03/2024 04:24 PM EDT RP Workstation: [id number]   CT HEAD CODE STROKE WO CONTRAST Result Date: 03/03/2024 EXAM: CT HEAD WITHOUT CONTRAST 03/03/2024 03:55:48 PM TECHNIQUE: CT of the head was performed without the administration of intravenous contrast. Automated exposure control, iterative reconstruction, and/or weight based adjustment of the mA/kV was utilized to reduce the radiation dose to as low as reasonably achievable. COMPARISON: 10/02/2019 CLINICAL HISTORY: Neuro deficit, acute, stroke suspected. FINDINGS: BRAIN AND VENTRICLES: No acute hemorrhage. No evidence of acute infarct. No hydrocephalus. No extra-axial collection. No mass effect or midline shift. Chronic  ischemic white matter changes. Old left frontal infarct. [place] Stroke Program Early CT Score (ASPECTS) ----- Ganglionic (caudate, ic, lentiform nucleus, insula, M1-m3): 7 Supraganglionic (m4-m6): 3 Total: 10 ORBITS: No acute abnormality. SINUSES: No acute abnormality. SOFT TISSUES AND SKULL: No acute soft tissue abnormality. No skull fracture. IMPRESSION: 1. No acute intracranial abnormality. 2. Old left frontal infarct and chronic ischemic white matter changes. 3. ASPECTS: 10. Findings communicated to Dr. Ashish Arora at 4:06 pm on 03/03/24 Electronically signed by: Franky Stanford MD 03/03/2024 04:11 PM EDT RP Workstation: [id number]     .Critical Care  Performed by: Ruthe Cornet, DO Authorized by: Ruthe Cornet, DO   Critical care provider statement:    Critical care time (minutes):  35   Critical care was necessary to treat or prevent imminent or life-threatening deterioration of the following conditions:  CNS failure or compromise   Critical care was time spent personally by me on the following activities:  Blood draw for specimens, development of treatment plan with patient or surrogate, discussions with primary provider, evaluation of patient's response to treatment, examination of patient, obtaining history from patient or surrogate, ordering and performing treatments and interventions, ordering and  review of laboratory studies, ordering and review of radiographic studies, pulse oximetry, re-evaluation of patient's condition and review of old charts   Care discussed with: admitting provider      Medications Ordered in the ED  sodium chloride  flush (NS) 0.9 % injection 3 mL (has no administration in time range)   stroke: early stages of recovery book (has no administration in time range)  0.9 %  sodium chloride  infusion (has no administration in time range)  acetaminophen  (TYLENOL ) tablet 650 mg (has no administration in time range)    Or  acetaminophen  (TYLENOL ) 160 MG/5ML solution 650  mg (has no administration in time range)    Or  acetaminophen  (TYLENOL ) suppository 650 mg (has no administration in time range)  senna-docusate (Senokot-S) tablet 1 tablet (has no administration in time range)  pantoprazole  (PROTONIX ) injection 40 mg (has no administration in time range)  clevidipine (CLEVIPREX) infusion 0.5 mg/mL (16 mg/hr Intravenous Infusion Verify 03/03/24 1644)  LORazepam  (ATIVAN ) injection 1 mg (1 mg Intravenous Given 03/03/24 1601)  tenecteplase (TNKASE) injection for Stroke 22 mg (22 mg Intravenous Given 03/03/24 1623)  iohexol  (OMNIPAQUE ) 350 MG/ML injection 75 mL (75 mLs Intravenous Contrast Given 03/03/24 1614)  labetalol (NORMODYNE) injection 10 mg (10 mg Intravenous Given 03/03/24 1614)                                    Medical Decision Making Amount and/or Complexity of Data Reviewed Labs: ordered. Radiology: ordered.  Risk Prescription drug management. Decision regarding hospitalization.   [name] [name] is here after fall code stroke initiated by EMS.  Last known normal at noon.  He is hypertensive here.  Tachycardic.  He is got aphasia right-sided weakness left-sided gaze.  He had a CTA head and neck CT head that was unremarkable no large vessel occlusion.  CT neck unremarkable.  CT head with no head bleed.  Patient was given labetalol started on Cleviprex and given TNK given his symptoms and concern for stroke.  Dr. Deedra with neurology talked with brother who is a family member who gave consent.  Lab work was otherwise unremarkable per my review and interpretation of labs.  Mild leukocytosis likely in the setting of stroke but will check chest x-ray urinalysis.  Overall to be admitted to the neurological ICU for further care.  Patient does have rib fractures but no pneumothorax.  Will need pain control for that.  This chart was dictated using voice recognition software.  Despite best efforts to proofread,  errors can occur which can change the documentation  meaning.      Final diagnoses:  Cerebrovascular accident (CVA), unspecified mechanism (HCC)  Fall, initial encounter  Closed fracture of multiple ribs, unspecified laterality, initial encounter    ED Discharge Orders     None          Ruthe Cornet, DO 03/03/24 1649    Ruthe Cornet, DO 03/03/24 1649    Ruthe, Jermond Burkemper, DO 03/03/24 1659

## 2024-03-03 NOTE — Progress Notes (Addendum)
 Dr. Michaela on unit. RN spoke with MD about patient's agitation, as well as tachycardia. MD stated he will put in fentanyl  pushes PRN for pain, in case it is related to pain, most likely the broken femur.  RN also mentioned the ST-Elevation on telemetry monitor, and the 12 lead ECG that was just completed; 12 lead negative for active STEMI.
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
## 2024-03-03 NOTE — Code Documentation (Signed)
 Stroke Response Nurse Documentation Code Documentation  [name] [name] is a 73 y.o. male arriving to [facility]  via [facility] on 03-03-24 with past medical hx of CHF, cirrhosis, HTN, OSA. On No antithrombotic. Code stroke was activated by EMS.   Patient from home where he was LKW at 1200 and now complaining of difficulty speaking, right side weakness and left gaze.  Per EMS a friend spoke with him at noon today and he was his normal self.  About 2:39 another friend called him and he did not sounds like himself.  The friend called EMS for a welfare check.  EMS found patient lying on the floor.  Stroke team at the bedside on patient arrival. Labs drawn and patient cleared for CT by Dr. Ruthe. Patient to CT with team. NIHSS 18, see documentation for details and code stroke times. Patient with disoriented, not following commands, left gaze preference , right hemianopia, right arm weakness, bilateral leg weakness, Global aphasia , and dysarthria  on exam. The following imaging was completed:  CT Head and CTA. Patient is a candidate for IV Thrombolytic.  TNK given at 1723. Patient is not a candidate for IR due to no LVO on CTA.   He was very restless upon arrival.  C collar placed upon arrival.  1 mg Ativan  given while in CT. BP 207/99 10 mg Labetalol given IV BP 218/95 10 mg Labetalol given IV Then started Cleviprex gtt, titrated to 16mcg  Care Plan: VS and NIHSS q x 2 hours then q x 6 hours then q 1 hour x 16 hours..   Process Delays Noted: clinical instability, managing BP and concern for cervical injury, await CTA head and neck  Bedside handoff with ED RN Alaina.    Elvin Portland  Stroke Response RN

## 2024-03-03 NOTE — Progress Notes (Signed)
 PHARMACIST CODE STROKE RESPONSE  Notified to mix TNK at 1612 by Dr. Voncile TNK preparation completed at 1613  TNK dose = 22 mg IV over 5 seconds.   Issues/delays encountered (if applicable): SBP necessitating multi - labetalol doses, cleviprex, also imaging  [name] [name] 03/03/24 4:26 PM

## 2024-03-03 NOTE — H&P (Signed)
 NEUROLOGY H&P NOTE   Date of service: March 03, 2024 Patient Name: [name] [name] MRN:  [id number] DOB:  06-14-1950 Chief Complaint: Code stroke  History of Present Illness  [name] [name] is a 73 y.o. male with hx of alcohol abuse, CHF, cirrhosis of the liver, esophageal varices, GERD, sleep apnea, hypertension, hyperlipidemia, sleep apnea, and vocal cord paralysis on the left who presents via EMS to the ED as a code stroke.  Per EMS will check was called and EMS found him on the floor with a laceration on his head, noted to be confused agitated, with left gaze preference and hypertensive at 240/110.  EMS activated a code stroke.  Last known well was at 12 PM noted by a friend.   On exam patient is confused, aphasic, can follow intermittent commands, has left gaze- does not cross midline, no blink to threat on right, right arm with drift, right leg no antigravity movement but can wiggle toes, left arm with no drift left leg with a drift CT head with no acute process.  CTA head and neck with no LVO.  There was a delay in TNK administration due to needing to obtain CTA prior to administration since was found with unwitnessed fall and laceration on head as well as patient hypertensive requiring 2 doses of labetalol and a Cleviprex drip started and titrated up to 16 mg to obtain ideal blood pressure.  Patient was deemed appropriate for TNK and it was administered at 1623 Patient's brother was updated by Dr. Voncile via phone.  Risks, benefits and alternatives of IVT discussed with family, brother via phone and they agreed. CTH personally reviewed by Dr. Voncile prior to TNK administration  Patient will be admitted to the neuro ICU for further management    Last known well: 12 PM Modified rankin score: 3-Moderate disability-requires help but walks WITHOUT assistance IV Thrombolysis: Yes  Thrombectomy: No LVO  NIHSS components Score: Comment  1a Level of Conscious 0[x]  1[]  2[]  3[]      1b LOC  Questions 0[]  1[]  2[x]       1c LOC Commands 0[]  1[]  2[x]       2 Best Gaze 0[]  1[x]  2[]       3 Visual 0[]  1[x]  2[]  3[]      4 Facial Palsy 0[x]  1[]  2[]  3[]      5a Motor Arm - left 0[x]  1[]  2[]  3[]  4[]  UN[]    5b Motor Arm - Right 0[]  1[]  2[x]  3[]  4[]  UN[]    6a Motor Leg - Left 0[]  1[]  2[x]  3[]  4[]  UN[]    6b Motor Leg - Right 0[]  1[]  2[]  3[x]  4[]  UN[]    7 Limb Ataxia 0[x]  1[]  2[]  UN[]      8 Sensory 0[]  1[x]  2[]  UN[]      9 Best Language 0[]  1[]  2[x]  3[]      10 Dysarthria 0[]  1[]  2[x]  UN[]      11 Extinct. and Inattention 0[]  1[]  2[]       TOTAL: 18      ROS  Comprehensive ROS Unable to perform due to aphasia  Past History   Past Medical History:  Diagnosis Date   Alcohol abuse    Anemia    Arthritis    CHF (congestive heart failure) (HCC)    Cirrhosis of liver (HCC)    Cirrhosis, alcoholic (HCC)    last etoh 3/13-sees dr abran   Colon polyps    Esophageal varices (HCC)    GERD (gastroesophageal reflux disease)    H/O sleep apnea  Hyperlipidemia    Hypertension    Sleep apnea    had surgery to correct snoring 2001   Vocal cord paralysis    left   Past Surgical History:  Procedure Laterality Date   APPENDECTOMY     COLONOSCOPY  2013   HERNIA REPAIR     INSERTION OF MESH  04/21/2012   Procedure: INSERTION OF MESH;  Surgeon: Vicenta DELENA Poli, MD;  Location: [facility];  Service: General;  Laterality: N/A;   MOUTH SURGERY  7/13   ORIF ANKLE FRACTURE Left 02/12/2014   Procedure: OPEN REDUCTION INTERNAL FIXATION (ORIF) LEFT ANKLE FRACTURE ;  Surgeon: Norleen Armor, MD;  Location: [facility] OR;  Service: Orthopedics;  Laterality: Left;   PERCUTANEOUS PINNING Left 02/12/2014   Procedure: Closed Reduction  PERCUTANEOUS PINNING left great toe;  Surgeon: Norleen Armor, MD;  Location: [facility] OR;  Service: Orthopedics;  Laterality: Left;   ROTATOR CUFF REPAIR Bilateral    SHOULDER OPEN ROTATOR CUFF REPAIR     2 on right shoulder, 1 on left shoulder   THROAT SURGERY      surgery to help with snoring   UMBILICAL HERNIA REPAIR  04/21/2012   Procedure: HERNIA REPAIR UMBILICAL ADULT;  Surgeon: Vicenta DELENA Poli, MD;  Location: [facility];  Service: General;  Laterality: N/A;  umbilical hernia repair with mesh   UMBILICAL HERNIA REPAIR     UPPER GI ENDOSCOPY  2013   UVULOPALATOPHARYNGOPLASTY  2001   UVULOPALATOPHARYNGOPLASTY     Family History  Problem Relation Age of Onset   Diabetes Mother    Congestive Heart Failure Mother    Heart disease Father        first MI at age 107, stents, CABG   Diabetes Father    Atrial fibrillation Brother    CAD Brother        MI and stent age 68   Hypertension Brother    Colon cancer Neg Hx    Stomach cancer Neg Hx    Rectal cancer Neg Hx    Social History   Socioeconomic History   Marital status: Widowed    Spouse name: Not on file   Number of children: Not on file   Years of education: Not on file   Highest education level: Not on file  Occupational History   Not on file  Tobacco Use   Smoking status: Never   Smokeless tobacco: Never  Vaping Use   Vaping status: Never Used  Substance and Sexual Activity   Alcohol use: No    Comment: per patient has not drank since 2015   Drug use: Yes    Types: Marijuana    Comment: occasionally per patient    Sexual activity: Not on file  Other Topics Concern   Not on file  Social History Narrative   ** Merged History Encounter **       Social Drivers of Corporate Investment Banker Strain: Not on file  Food Insecurity: Not on file  Transportation Needs: Not on file  Physical Activity: Not on file  Stress: Not on file  Social Connections: Not on file   Allergies  Allergen Reactions   Adhesive [Tape] Other (See Comments)    REACTION: blisters.  Use paper tape only   Amlodipine  Other (See Comments)    REACTION:  unknown   Amoxicillin Hives    Did it involve swelling of the face/tongue/throat, SOB, or low BP? No Did it involve sudden or  severe rash/hives, skin peeling, or any reaction on the inside of your mouth or nose? No Did you need to seek medical attention at a hospital or doctor's office? No When did it last happen? childhood       If all above answers are "NO", may proceed with cephalosporin use.    Atenolol  Other (See Comments)    REACTION:  unknown   Celebrex [Celecoxib] Nausea And Vomiting    Also Vioxx   Cephalosporins Hives   Clindamycin/Lincomycin Hives   Dexon [Dexamethasone  Sodium Phosphate ] Other (See Comments)    REACTION:  unknown   Doxazosin Mesylate Other (See Comments)    REACTION: unknown   Duraprep [Antiseptic Products, Misc.]    Enalapril Other (See Comments)    REACTION: dry cough   Erythromycin Hives   Hctz [Hydrochlorothiazide ] Other (See Comments)    REACTION: severe sensitivity to light, burning sensation   Iodine     AGENT: Duraprep REACTION:  blisters   Lipitor [Atorvastatin]    Other     AGENT:Vicryl, cephalnxon REACTION:  body rejects it   Toprol  Xl [Metoprolol  Succinate]    Verapamil Other (See Comments)    REACTION:  Dry cough   Vioxx [Rofecoxib] Hives   Zocor [Simvastatin] Nausea And Vomiting   Latex Rash    Medications   Current Facility-Administered Medications:    [START ON 03/04/2024]  stroke: early stages of recovery book, , Does not apply, Once, Waddell Aquas A, NP   0.9 %  sodium chloride  infusion, , Intravenous, Continuous, Wolfe, Denise A, NP   acetaminophen  (TYLENOL ) tablet 650 mg, 650 mg, Oral, Q4H PRN **OR** acetaminophen  (TYLENOL ) 160 MG/5ML solution 650 mg, 650 mg, Per Tube, Q4H PRN **OR** acetaminophen  (TYLENOL ) suppository 650 mg, 650 mg, Rectal, Q4H PRN, Waddell Aquas A, NP   clevidipine (CLEVIPREX) infusion 0.5 mg/mL, 0-21 mg/hr, Intravenous, Continuous, Wolfe, Denise A, NP   LORazepam  (ATIVAN ) injection 1 mg, 1 mg, Intravenous, Once, Curatolo, Adam, DO   pantoprazole  (PROTONIX ) injection 40 mg, 40 mg, Intravenous, QHS, Wolfe, Denise A, NP    senna-docusate (Senokot-S) tablet 1 tablet, 1 tablet, Oral, QHS PRN, Waddell Aquas A, NP   sodium chloride  flush (NS) 0.9 % injection 3 mL, 3 mL, Intravenous, Once, Curatolo, Adam, DO   tenecteplase (TNKASE) injection for Stroke 22 mg, 0.25 mg/kg, Intravenous, Once, Curatolo, Adam, DO  Current Outpatient Medications:    acetaminophen  (TYLENOL ) 325 MG tablet, Take 1-2 tablets (325-650 mg total) by mouth every 6 (six) hours as needed for mild pain., Disp: , Rfl:    amLODipine  (NORVASC ) 5 MG tablet, Take 1 tablet (5 mg total) by mouth daily., Disp: 90 tablet, Rfl: 3   docusate (COLACE) 50 MG/5ML liquid, Take 50 mg by mouth daily as needed for mild constipation., Disp: , Rfl:    ezetimibe  (ZETIA ) 10 MG tablet, Take 1 tablet (10 mg total) by mouth daily., Disp: 90 tablet, Rfl: 3   Multiple Vitamin (MULITIVITAMIN WITH MINERALS) TABS, Take 1 tablet by mouth daily., Disp: , Rfl:    propranolol  (INDERAL ) 20 MG tablet, Take 1 tablet (20 mg total) by mouth 2 (two) times daily., Disp: 180 tablet, Rfl: 3  Facility-Administered Medications Ordered in Other Encounters:    etomidate  (AMIDATE ) injection, , , Anesthesia Intra-op, Bilotta, Mary Z, CRNA, 20 mg at 02/27/14 0545   fentaNYL  (SUBLIMAZE ) injection, , , Anesthesia Intra-op, Bilotta, Mary Z, CRNA, 100 mcg at 02/27/14 0545   succinylcholine  (ANECTINE ) injection, , , Anesthesia Intra-op, Bilotta, Ronal PEDLAR, CRNA, 120 mg  at 02/27/14 0545   Vitals   Vitals:   03/03/24 1500  Weight: 89.8 kg     Body mass index is 31.95 kg/m.  Physical Exam   Constitutional: Appears well-developed and well-nourished.  Psych: Affect appropriate to situation.  Eyes: No scleral injection.  HENT: No OP obstruction.  Head: Normocephalic.  Cardiovascular: Normal rate and regular rhythm.  Respiratory: Effort normal, non-labored breathing.  GI: Soft.  No distension. There is no tenderness.  Skin: WDI.   Neurologic Examination   Mental Status -  Confused  Cranial  Nerves II - XII - II - Visual field left gaze, no blink to threat on right III, IV, VI -does not cross midline V - Facial sensation intact bilaterally. VII - Facial movement intact bilaterally. VIII - Hearing & vestibular intact bilaterally. X - Palate elevates symmetrically. XI - Chin turning & shoulder shrug intact bilaterally. XII - Tongue protrusion intact.  Motor Strength -  right arm with drift, right leg no antigravity movement but can wiggle toes, left arm with no drift left leg with a drift Motor Tone - Muscle tone was assessed at the neck and appendages and was normal. Sensory - Light touch, temperature/pinprick were assessed and were symmetrical.   Coordination -unable to assess Gait and Station - deferred.  Labs   CBC:  Recent Labs  Lab 03/03/24 1546 03/03/24 1551  WBC 21.2*  --   NEUTROABS 19.0*  --   HGB 13.5 15.0  HCT 41.0 44.0  MCV 95.8  --   PLT 361  --    Basic Metabolic Panel:  Lab Results  Component Value Date   NA 133 (L) 03/03/2024   K 4.0 03/03/2024   CO2 19 (L) 10/07/2023   GLUCOSE 212 (H) 03/03/2024   BUN 17 03/03/2024   CREATININE 1.10 03/03/2024   CALCIUM 8.4 (L) 10/07/2023   GFRNONAA >60 09/20/2019   GFRAA >60 09/20/2019   Lipid Panel:  Lab Results  Component Value Date   LDLCALC 125 (H) 07/02/2018   HgbA1c:  Lab Results  Component Value Date   HGBA1C 6.1 (H) 07/01/2018   Urine Drug Screen:     Component Value Date/Time   LABOPIA NONE DETECTED 10/13/2012 1600   COCAINSCRNUR NONE DETECTED 10/13/2012 1600   LABBENZ NONE DETECTED 10/13/2012 1600   AMPHETMU NONE DETECTED 10/13/2012 1600   THCU NONE DETECTED 10/13/2012 1600   LABBARB NONE DETECTED 10/13/2012 1600    Alcohol Level     Component Value Date/Time   [facility] <15 03/03/2024 1546   INR  Lab Results  Component Value Date   INR 1.0 03/03/2024   APTT  Lab Results  Component Value Date   APTT 27 03/03/2024     CT Head without contrast(Personally reviewed): No  acute intracranial abnormality. 2. Old left frontal infarct and chronic ischemic white matter changes. 3. ASPECTS: 10.  CT angio Head and Neck with contrast(Personally reviewed): No LVO Atherosclerotic disease at both carotid bifurcations without hemodynamically significant stenosis   Assessment   [name] [name] is a 73 y.o. male alcohol abuse, CHF, cirrhosis of the liver, esophageal varices, GERD, sleep apnea, hypertension, hyperlipidemia, aortic valve stenosis, PAD and vocal cord paralysis on the left who presents via EMS to the ED as a code stroke.  Per EMS will check was called and EMS found him on the floor with a laceration on his head, noted to be confused agitated, with left gaze preference and hypertensive at 240/110.  He received IV TNK  Primary Diagnosis:  Acute left hemisphere ischemic stroke  Secondary Diagnosis: Essential (primary) hypertension Hyperlipidemia Nonrheumatic aortic valve stenosis OSA Cirrhosis of the liver History EtOH abuse Leukocytosis  Recommendations  - Admit to 4 N. ICU - BP goal 180/105 post TNK - Cleviprex drip to maintain BP goal -Bedside swallow screen.  If passes may have heart healthy diet - SCDs -EKG - UDS - Check UA and chest x-ray - CBC, BMP, mag and Phos in the a.m. - HgbA1c, fasting lipid panel - MRI of the brain without contrast - 24-hour brain imaging post TNK if MRI brain not obtained by that time - Frequent neuro checks - Echocardiogram - Hold all antiplatelet medications for 24 hours post TNK - Risk factor modification - Telemetry monitoring - PT consult, OT consult, Speech consult - Stroke team to follow ______________________________________________________________________   Signed, Karna DELENA Geralds, NP [facility]  Attending Neurohospitalist Addendum Patient seen and examined with APP/Resident. Agree with the history and physical as documented above. Agree with the plan as documented, which I helped  formulate. I have independently reviewed the chart, obtained history, review of systems and examined the patient.I have personally reviewed pertinent head/neck/spine imaging (CT/MRI).  Patient seen and examined. Last known well within 4-1/2 hours at 12 PM. Noted to be aphasic, weak on the right side, left gaze preference, right hemianopsia. High NIH stroke scale. Due to him being found on ground, did CTA head and neck and a CT C-spine as a part of that to ensure there was no neck fracture or spine injury.  Once that was discussed with the radiologist, discussed risk benefits and alternatives of TNKase with the brother who agreed to proceed with TNK.  CT head was reviewed personally prior to TNK administration and was negative for bleed. Left MCA stroke versus toxic metabolic encephalopathy as the running diagnosis. Will be admitted to neuro ICU for post TNK care  Significant delays: Obtaining history due to patient aphasia and inability to reach family, ultimately reach family but they were not with the patient, scanning of the spine to ensure there was no fracture since he was found down on the ground, blood pressure management requiring multiple medications before getting blood pressure to goal.   Please feel free to call with any questions.  -- Eligio Lav, MD Neurologist [facility] Pager: [phone]  CRITICAL CARE ATTESTATION Performed by: Eligio Lav, MD Total critical care time: 50 minutes Critical care time was exclusive of separately billable procedures and treating other patients and/or supervising APPs/Residents/Students Critical care was necessary to treat or prevent imminent or life-threatening deterioration. This patient is critically ill and at significant risk for neurological worsening and/or death and care requires constant monitoring. Critical care was time spent personally by me on the following activities: development of treatment plan with patient  and/or surrogate as well as nursing, discussions with consultants, evaluation of patient's response to treatment, examination of patient, obtaining history from patient or surrogate, ordering and performing treatments and interventions, ordering and review of laboratory studies, ordering and review of radiographic studies, pulse oximetry, re-evaluation of patient's condition, participation in multidisciplinary rounds and medical decision making of high complexity in the care of this patient.

## 2024-03-03 NOTE — ED Triage Notes (Signed)
 Per EMS, Pt, from home, presents w/ L gaze and aphasia.  Pt had a friend call at 1439, noted slurred speech, and called for a welfare check.  Pt was found on the ground and combative.   Another friend reports speaking to the Pt at noon.  Pt was normal at that time.    Abraison on forehead and multiple skin tears on bilateral arms noted.

## 2024-03-03 NOTE — ED Notes (Signed)
 Pt noted to be extremely restless.  Pt continually trying to turn onto R side.  Of note, Pt has fractured ribs and a broken femur on the R side.

## 2024-03-04 ENCOUNTER — Inpatient Hospital Stay (HOSPITAL_COMMUNITY)

## 2024-03-04 DIAGNOSIS — I4892 Unspecified atrial flutter: Secondary | ICD-10-CM

## 2024-03-04 DIAGNOSIS — I350 Nonrheumatic aortic (valve) stenosis: Secondary | ICD-10-CM

## 2024-03-04 DIAGNOSIS — I69391 Dysphagia following cerebral infarction: Secondary | ICD-10-CM

## 2024-03-04 DIAGNOSIS — Z01810 Encounter for preprocedural cardiovascular examination: Secondary | ICD-10-CM

## 2024-03-04 DIAGNOSIS — I6389 Other cerebral infarction: Secondary | ICD-10-CM

## 2024-03-04 DIAGNOSIS — I639 Cerebral infarction, unspecified: Secondary | ICD-10-CM | POA: Diagnosis not present

## 2024-03-04 DIAGNOSIS — S72141A Displaced intertrochanteric fracture of right femur, initial encounter for closed fracture: Secondary | ICD-10-CM

## 2024-03-04 DIAGNOSIS — R29708 NIHSS score 8: Secondary | ICD-10-CM

## 2024-03-04 DIAGNOSIS — W19XXXA Unspecified fall, initial encounter: Secondary | ICD-10-CM

## 2024-03-04 LAB — URINALYSIS, ROUTINE W REFLEX MICROSCOPIC
Bilirubin Urine: NEGATIVE
Glucose, UA: NEGATIVE mg/dL
Ketones, ur: 5 mg/dL — AB
Leukocytes,Ua: NEGATIVE
Nitrite: NEGATIVE
Protein, ur: >=300 mg/dL — AB
Specific Gravity, Urine: >1.046 — ABNORMAL HIGH (ref 1.005–1.030)
pH: 5 (ref 5.0–8.0)

## 2024-03-04 LAB — CBC
HCT: 33.5 % — ABNORMAL LOW (ref 39.0–52.0)
Hemoglobin: 11 g/dL — ABNORMAL LOW (ref 13.0–17.0)
MCH: 31.8 pg (ref 26.0–34.0)
MCHC: 32.8 g/dL (ref 30.0–36.0)
MCV: 96.8 fL (ref 80.0–100.0)
Platelets: 294 K/uL (ref 150–400)
RBC: 3.46 MIL/uL — ABNORMAL LOW (ref 4.22–5.81)
RDW: 14 % (ref 11.5–15.5)
WBC: 19.3 K/uL — ABNORMAL HIGH (ref 4.0–10.5)
nRBC: 0 % (ref 0.0–0.2)

## 2024-03-04 LAB — LIPID PANEL
Cholesterol: 195 mg/dL (ref 0–200)
HDL: 37 mg/dL — ABNORMAL LOW (ref >40)
LDL Cholesterol: 127 mg/dL — ABNORMAL HIGH (ref 0–99)
Total CHOL/HDL Ratio: 5.3 ratio
Triglycerides: 157 mg/dL — ABNORMAL HIGH (ref <150)
VLDL: 31 mg/dL (ref 0–40)

## 2024-03-04 LAB — BASIC METABOLIC PANEL WITH GFR
Anion gap: 16 — ABNORMAL HIGH (ref 5–15)
BUN: 32 mg/dL — ABNORMAL HIGH (ref 8–23)
CO2: 15 mmol/L — ABNORMAL LOW (ref 22–32)
Calcium: 8.4 mg/dL — ABNORMAL LOW (ref 8.9–10.3)
Chloride: 104 mmol/L (ref 98–111)
Creatinine, Ser: 1.92 mg/dL — ABNORMAL HIGH (ref 0.61–1.24)
GFR, Estimated: 36 mL/min — ABNORMAL LOW (ref >60)
Glucose, Bld: 153 mg/dL — ABNORMAL HIGH (ref 70–99)
Potassium: 3.3 mmol/L — ABNORMAL LOW (ref 3.5–5.1)
Sodium: 135 mmol/L (ref 135–145)

## 2024-03-04 LAB — ECHOCARDIOGRAM COMPLETE
AR max vel: 1.21 cm2
AV Area VTI: 1.27 cm2
AV Area mean vel: 1.23 cm2
AV Mean grad: 13.7 mmHg
AV Peak grad: 26.6 mmHg
Ao pk vel: 2.58 m/s
Est EF: >75
MV VTI: 1.26 cm2
S' Lateral: 2.1 cm
Weight: 3167.57 [oz_av]

## 2024-03-04 LAB — RAPID URINE DRUG SCREEN, HOSP PERFORMED
Amphetamines: NOT DETECTED
Barbiturates: NOT DETECTED
Benzodiazepines: POSITIVE — AB
Cocaine: NOT DETECTED
Opiates: POSITIVE — AB
Tetrahydrocannabinol: POSITIVE — AB

## 2024-03-04 LAB — TYPE AND SCREEN
ABO/RH(D): A POS
Antibody Screen: NEGATIVE

## 2024-03-04 LAB — TROPONIN I (HIGH SENSITIVITY): Troponin I (High Sensitivity): 366 ng/L (ref <18)

## 2024-03-04 MED ORDER — POTASSIUM CHLORIDE 10 MEQ/100ML IV SOLN
10.0000 meq | INTRAVENOUS | Status: AC
  Administered 2024-03-04 – 2024-03-05 (×4): 10 meq via INTRAVENOUS
  Filled 2024-03-04 (×3): qty 100

## 2024-03-04 MED ORDER — IOHEXOL 350 MG/ML SOLN
75.0000 mL | Freq: Once | INTRAVENOUS | Status: AC | PRN
  Administered 2024-03-04: 75 mL via INTRAVENOUS

## 2024-03-04 MED ORDER — QUETIAPINE FUMARATE 25 MG PO TABS
25.0000 mg | ORAL_TABLET | Freq: Every day | ORAL | Status: DC
  Administered 2024-03-04 – 2024-03-13 (×10): 25 mg via ORAL
  Filled 2024-03-04 (×10): qty 1

## 2024-03-04 MED ORDER — CHLORHEXIDINE GLUCONATE CLOTH 2 % EX PADS
6.0000 | MEDICATED_PAD | Freq: Every day | CUTANEOUS | Status: DC
  Administered 2024-03-04 – 2024-03-14 (×8): 6 via TOPICAL

## 2024-03-04 MED ORDER — CEFAZOLIN SODIUM-DEXTROSE 2-4 GM/100ML-% IV SOLN: 2.0000 g | INTRAVENOUS | Status: DC

## 2024-03-04 MED ORDER — TRANEXAMIC ACID-NACL 1000-0.7 MG/100ML-% IV SOLN: 1000.0000 mg | INTRAVENOUS | Status: DC

## 2024-03-04 MED ORDER — SODIUM CHLORIDE 0.9 % IV SOLN: INTRAVENOUS | Status: AC

## 2024-03-04 MED ORDER — CYCLOBENZAPRINE HCL 10 MG PO TABS
5.0000 mg | ORAL_TABLET | Freq: Three times a day (TID) | ORAL | Status: DC | PRN
Start: 2024-03-04 — End: 2024-03-14
  Administered 2024-03-04 – 2024-03-13 (×10): 5 mg via ORAL
  Filled 2024-03-04 (×10): qty 1

## 2024-03-04 MED ORDER — SODIUM CHLORIDE 0.9 % IV BOLUS
1000.0000 mL | Freq: Once | INTRAVENOUS | Status: AC
  Administered 2024-03-04: 1000 mL via INTRAVENOUS

## 2024-03-04 MED ORDER — ASPIRIN 81 MG PO CHEW
81.0000 mg | CHEWABLE_TABLET | Freq: Every day | ORAL | Status: DC
  Administered 2024-03-04: 81 mg via ORAL
  Filled 2024-03-04 (×2): qty 1

## 2024-03-04 MED ORDER — PERFLUTREN LIPID MICROSPHERE
1.0000 mL | INTRAVENOUS | Status: AC | PRN
  Administered 2024-03-04: 4 mL via INTRAVENOUS

## 2024-03-04 MED ORDER — EZETIMIBE 10 MG PO TABS
10.0000 mg | ORAL_TABLET | Freq: Every day | ORAL | Status: DC
  Administered 2024-03-04 – 2024-03-14 (×10): 10 mg via ORAL
  Filled 2024-03-04 (×10): qty 1

## 2024-03-04 MED ORDER — OXYCODONE HCL 5 MG PO TABS
10.0000 mg | ORAL_TABLET | Freq: Four times a day (QID) | ORAL | Status: DC | PRN
  Administered 2024-03-04 – 2024-03-08 (×11): 10 mg via ORAL
  Filled 2024-03-04 (×11): qty 2

## 2024-03-04 NOTE — Progress Notes (Signed)
 PT Cancellation Note  Patient Details Name: [name] [name] MRN: [id number] DOB: 1950-07-20   Cancelled Treatment:    Reason Eval/Treat Not Completed: (P) Active bedrest order & awaiting ortho consult for R femoral fx. Will plan to follow-up as able once activity orders progress.   Theo Ferretti, PT, DPT Acute Rehabilitation Services  Office: [phone]    Theo CHRISTELLA Ferretti 03/04/2024, 8:09 AM

## 2024-03-04 NOTE — Consult Note (Signed)
 CARDIOLOGY CONSULT NOTE    Patient ID: [name] [name]; [id number]; 07/10/1950   Admit date: 03/03/2024 Date of Consult: 03/04/2024  Primary Care Provider: Loring Tanda Mae, MD Primary Cardiologist:  Primary Electrophysiologist:    History of Present Illness:   [name] [name] is a 73 y/o M known to have moderate aortic valve stenosis, HTN, HLD, cirrhosis presented to the ER with code stroke.  He is currently admitted to the stroke team for the management of acute CVA.  CT head did not reveal any CVA.  MRI brain showed very small acute cortical infarct in the left superior occipital lobe.  No intracranial hemorrhage or mass effect was noted.  He received TNK yesterday at 4:30 PM.  He also was complaining of hip pain for which further imaging was obtained that showed a right femur fracture.  Orthopedics was consulted.  Echocardiogram this admission showed normal LVEF, moderate aortic valve stenosis and CVP 3 mmHg.  Patient is a poor historian.  History not reliable.  Denies having chest pain.  Denies having any symptoms except for pain in the hip.  EKG and telemetry reviewed, in atrial flutter.  Past Medical History:  Diagnosis Date   Alcohol abuse    Anemia    Arthritis    CHF (congestive heart failure) (HCC)    Cirrhosis of liver (HCC)    Cirrhosis, alcoholic (HCC)    last etoh 3/13-sees dr abran   Colon polyps    Esophageal varices (HCC)    GERD (gastroesophageal reflux disease)    H/O sleep apnea    Hyperlipidemia    Hypertension    Sleep apnea    had surgery to correct snoring 2001   Vocal cord paralysis    left    Past Surgical History:  Procedure Laterality Date   APPENDECTOMY     COLONOSCOPY  2013   HERNIA REPAIR     INSERTION OF MESH  04/21/2012   Procedure: INSERTION OF MESH;  Surgeon: Vicenta DELENA Poli, MD;  Location: [facility];  Service: General;  Laterality: N/A;   MOUTH SURGERY  7/13   ORIF ANKLE FRACTURE Left 02/12/2014   Procedure: OPEN  REDUCTION INTERNAL FIXATION (ORIF) LEFT ANKLE FRACTURE ;  Surgeon: Norleen Armor, MD;  Location: [facility] OR;  Service: Orthopedics;  Laterality: Left;   PERCUTANEOUS PINNING Left 02/12/2014   Procedure: Closed Reduction  PERCUTANEOUS PINNING left great toe;  Surgeon: Norleen Armor, MD;  Location[facility] OR;  Service: Orthopedics;  Laterality: Left;   ROTATOR CUFF REPAIR Bilateral    SHOULDER OPEN ROTATOR CUFF REPAIR     2 on right shoulder, 1 on left shoulder   THROAT SURGERY     surgery to help with snoring   UMBILICAL HERNIA REPAIR  04/21/2012   Procedure: HERNIA REPAIR UMBILICAL ADULT;  Surgeon: Vicenta DELENA Poli, MD;  Location: [facility];  Service: General;  Laterality: N/A;  umbilical hernia repair with mesh   UMBILICAL HERNIA REPAIR     UPPER GI ENDOSCOPY  2013   UVULOPALATOPHARYNGOPLASTY  2001   UVULOPALATOPHARYNGOPLASTY         Inpatient Medications: Scheduled Meds:  aspirin   81 mg Oral Daily   Chlorhexidine  Gluconate Cloth  6 each Topical Daily   ezetimibe   10 mg Oral Daily   pantoprazole  (PROTONIX ) IV  40 mg Intravenous QHS   Continuous Infusions:  [START ON 03/05/2024]  ceFAZolin  (ANCEF ) IV     clevidipine Stopped (03/03/24 2151)   [START ON  03/05/2024] tranexamic acid     PRN Meds: acetaminophen  **OR** acetaminophen  (TYLENOL ) oral liquid 160 mg/5 mL **OR** acetaminophen , cyclobenzaprine, fentaNYL  (SUBLIMAZE ) injection, mouth rinse, oxyCODONE , senna-docusate  Allergies:    Allergies  Allergen Reactions   Adhesive [Tape] Other (See Comments)    REACTION: blisters.  Use paper tape only   Amlodipine  Other (See Comments)    REACTION:  unknown   Amoxicillin Hives    Did it involve swelling of the face/tongue/throat, SOB, or low BP? No Did it involve sudden or severe rash/hives, skin peeling, or any reaction on the inside of your mouth or nose? No Did you need to seek medical attention at a hospital or doctor's office? No When did it last happen? childhood       If  all above answers are "NO", may proceed with cephalosporin use.    Atenolol  Other (See Comments)    REACTION:  unknown   Celebrex [Celecoxib] Nausea And Vomiting    Also Vioxx   Cephalosporins Hives   Clindamycin/Lincomycin Hives   Dexon [Dexamethasone  Sodium Phosphate ] Other (See Comments)    REACTION:  unknown   Doxazosin Mesylate Other (See Comments)    REACTION: unknown   Duraprep [Antiseptic Products, Misc.]    Enalapril Other (See Comments)    REACTION: dry cough   Erythromycin Hives   Hctz [Hydrochlorothiazide ] Other (See Comments)    REACTION: severe sensitivity to light, burning sensation   Iodine     AGENT: Duraprep REACTION:  blisters   Lipitor [Atorvastatin]    Other     AGENT:Vicryl, cephalnxon REACTION:  body rejects it   Toprol  Xl [Metoprolol  Succinate]    Verapamil Other (See Comments)    REACTION:  Dry cough   Vioxx [Rofecoxib] Hives   Zocor [Simvastatin] Nausea And Vomiting   Latex Rash    Social History:   Social History   Socioeconomic History   Marital status: Widowed    Spouse name: Not on file   Number of children: Not on file   Years of education: Not on file   Highest education level: Not on file  Occupational History   Not on file  Tobacco Use   Smoking status: Never   Smokeless tobacco: Never  Vaping Use   Vaping status: Never Used  Substance and Sexual Activity   Alcohol use: No    Comment: per patient has not drank since 2015   Drug use: Yes    Types: Marijuana    Comment: occasionally per patient    Sexual activity: Not on file  Other Topics Concern   Not on file  Social History Narrative   ** Merged History Encounter **       Social Drivers of Corporate Investment Banker Strain: Not on file  Food Insecurity: Not on file  Transportation Needs: Not on file  Physical Activity: Not on file  Stress: Not on file  Social Connections: Not on file  Intimate Partner Violence: Not on file    Family History:    Family  History  Problem Relation Age of Onset   Diabetes Mother    Congestive Heart Failure Mother    Heart disease Father        first MI at age 51, stents, CABG   Diabetes Father    Atrial fibrillation Brother    CAD Brother        MI and stent age 17   Hypertension Brother    Colon cancer Neg Hx  Stomach cancer Neg Hx    Rectal cancer Neg Hx      ROS:  Please see the history of present illness.  ROS  All other ROS reviewed and negative.     Physical Exam/Data:   Vitals:   03/04/24 1203 03/04/24 1300 03/04/24 1400 03/04/24 1500  BP: (!) 167/90 (!) 164/85 131/89 (!) 144/78  Pulse: 94 92 (!) 106 95  Resp: 16 (!) 26 18 (!) 25  Temp:    98.7 F (37.1 C)  TempSrc:    Oral  SpO2: 95% 94% 96% 94%  Weight:        Intake/Output Summary (Last 24 hours) at 03/04/2024 1729 Last data filed at 03/04/2024 1400 Gross per 24 hour  Intake 508.01 ml  Output 175 ml  Net 333.01 ml   Filed Weights   03/03/24 1500  Weight: 89.8 kg   Body mass index is 31.95 kg/m.  General:  Well nourished, well developed, in no acute distress HEENT: normal Lymph: no adenopathy Neck: no JVD Endocrine:  No thryomegaly Vascular: No carotid bruits; ESM Cardiac:  normal S1, S2; RRR; no murmur  Lungs:  clear to auscultation bilaterally, no wheezing, rhonchi or rales  Abd: soft, nontender, no hepatomegaly  Ext: no edema Skin: warm and dry  Psych:  Normal affect   Laboratory Data:  Chemistry Recent Labs  Lab 03/03/24 1546 03/03/24 1551 03/04/24 1503  NA 132* 133* 135  K 4.0 4.0 3.3*  CL 96* 100 104  CO2 18*  --  15*  GLUCOSE 218* 212* 153*  BUN 14 17 32*  CREATININE 1.13 1.10 1.92*  CALCIUM 9.0  --  8.4*  GFRNONAA >60  --  36*  ANIONGAP 18*  --  16*    Recent Labs  Lab 03/03/24 1546  PROT 7.7  ALBUMIN  3.9  AST 36  ALT 22  ALKPHOS 98  BILITOT 1.6*   Hematology Recent Labs  Lab 03/03/24 1546 03/03/24 1551 03/04/24 1503  WBC 21.2*  --  19.3*  RBC 4.28  --  3.46*  HGB 13.5  15.0 11.0*  HCT 41.0 44.0 33.5*  MCV 95.8  --  96.8  MCH 31.5  --  31.8  MCHC 32.9  --  32.8  RDW 13.3  --  14.0  PLT 361  --  294   Cardiac EnzymesNo results for input(s): TROPONINI in the last 168 hours. No results for input(s): TROPIPOC in the last 168 hours.  BNPNo results for input(s): BNP, PROBNP in the last 168 hours.  DDimer No results for input(s): DDIMER in the last 168 hours.  Radiology/Studies:    Assessment and Plan:   Atrial flutter with RVR - New onset this admission - Currently rate controlled on telemetry but has intermittent RVR episodes. - Not on any rate controlling agents now. - Echocardiogram this admission showed normal LVEF, moderate aortic valve stenosis and CVP 3 mmHg. - Recommend starting metoprolol  tartrate 25 mg twice daily if no contraindications. - Received TNK yesterday at 4:30 PM.  Recommend starting heparin  drip if no contraindications.  Switch to DOAC on discharge.  Preoperative risk stratification for femur fracture repair - Echocardiogram this admission showed normal LVEF, moderate aortic valve stenosis and CVP 3 mmHg.  Low risk for any perioperative cardiac complications.  HR will need to be less than 100 bpm.  Moderate aortic valve stenosis - Echocardiogram this admission showed moderate aortic valve stenosis and a normal LVEF.  Aortic valve was not visualized.  Images reviewed.  Outpatient surveillance with echocardiogram every 1 year.   60 minutes spent in reviewing prior medical records, more than 3 labs, discussion and documentation.  Discussed case with the family at the bedside.  Patient is a poor historian due to CVA.   For questions or updates, please contact [facility] Please consult [email or web address] for contact info under Cardiology/STEMI.   Signed, Gautham Hewins Priya Meilyn Heindl, MD 03/04/2024 5:29 PM

## 2024-03-04 NOTE — Consult Note (Signed)
 Orthopedic Surgery Consult Note  Assessment: Patient is a 73 y.o. male with right intertrochanteric femur fracture   Plan: -Planning for operative stabilization Tuesday evening -NPO at midnight on Monday night -Okay for DVT prophylaxis from ortho perspective -Ancef  and TXA on call to OR -Weight bearing status: NWB RLE -PT evaluate and treat post-op -Pain control -Dispo: pending completion of operative plans   Discussed recommendation for operative intervention in the form of right intertrochanteric femur fracture open reduction internal fixation with cephalomedullary rod.  This was discussed with the patient's brother Merilee and his wife who were both in the room. Explained the risks of this procedure included, but were not limited to: nonunion, malunion, hardware failure/malposition, infection, bleeding, stiffness, screw cut out, neurovascular injury, need for additional procedures, deep vein thrombosis, pulmonary embolism, and death. The benefits of this procedure would be to promote fracture healing by providing stability and to allow for early mobilization. The alternatives of this surgery would be to treat the fracture with immobilization in traction or to do no intervention.  [name] [name]'s questions were answered to his satisfaction. After this discussion, patient's brother elected to proceed with surgery on the patient's behalf. Informed consent was obtained. Merilee signed the consent form this evening.   ___________________________________________________________________________   Reason for consult: right intertrochanteric femur fracture  History:  Patient is a 74 y.o. male who had EMS called to his home yesterday.  Patient was found on the ground with aphasia and right-sided weakness.  Neurology was consulted for possible stroke.  He was admitted to neurology.  Thrombolytics were given.  He was having hip pain so a pelvis x-ray was obtained which showed a right proximal femur  fracture.  Orthopedics was consulted. Reporting right hip and right knee pain. No pain elsewhere. Undergoing further cardiac and neuro work up at this time.   Review of systems: General: denies fevers and chills, myalgias Neurologic: denies recent changes in vision, slurred speech Abdomen: denies nausea, vomiting, hematemesis Respiratory: denies cough, shortness of breath  Past medical history:  CHF Alcoholic cirrhosis Esophageal varices GERD HLD HTN   Allergies - 21 listed. Has an allergy to cephalosporins listed as hives. No allergy to TXA or opioids  Past surgical history:  Uvulopalatopharyngoplasty Hernia repair Bilateral rotator cuff repair Left hip percutaneous pinning Appendectomy  Social history: Denies use of nicotine-containing products (cigarettes, vaping, smokeless, etc.) Alcohol use: Denies Denies use of recreational drugs   Physical Exam:  BMI of 32.0  General: no acute distress, appears stated age Neurologic: alert, answering questions appropriately at times, having word finding difficulties, sometimes saying made up words, following commands with repeat instruction Cardiovascular: tachycardic Respiratory: unlabored breathing on room air, symmetric chest rise  MSK:   -Bilateral upper extremities  No tenderness to palpation over extremity, no gross deformity, no open wounds Fires deltoid, biceps, triceps, wrist extensors, wrist flexors, finger extensors, finger flexors  AIN/PIN/IO intact  Palpable radial pulse  Sensation intact to light touch in median/ulnar/radial/axillary nerve distributions  Hand warm and well perfused  -Left lower extremity  No tenderness to palpation over extremity, no gross deformity, no open wounds, no pain with logroll Fires hip flexors, quadriceps, hamstrings, tibialis anterior, gastrocnemius and soleus, extensor hallucis longus Plantarflexes and dorsiflexes toes Sensation intact to light touch in sural, saphenous, tibial,  deep peroneal, and superficial peroneal nerve distributions Foot warm and well perfused  - Right lower extremity  TTP over the right hip, no other tenderness palpation of the remainder of the extremity, no open  wounds, leg shorter when compared to contralateral side, pain with logroll Does not fire hip flexors due to pain.  Fires quadriceps, hamstrings, tibialis anterior, gastrocnemius and soleus, extensor hallucis longus Plantarflexes and dorsiflexes toes Sensation intact to light touch in sural, saphenous, tibial, deep peroneal, and superficial peroneal nerve distributions Foot warm and well perfused  Imaging: XRs of the pelvis from 03/04/2024 was independently reviewed and interpreted, showing a comminuted displaced right intertrochanteric femur fracture.  There is shortening through the fracture.  Fracture and varus alignment.  No other fracture seen.  No dislocation seen.   Patient name: [name] [name] Patient MRN: [id number] Date: 03/04/24

## 2024-03-04 NOTE — Plan of Care (Signed)
 Femur fracture on pelvis xray. Day team to consult ortho.  Eligio Lav, MD

## 2024-03-04 NOTE — Evaluation (Signed)
 Clinical/Bedside Swallow Evaluation Patient Details  Name: [name] [name] MRN: [id number] Date of Birth: 1950-10-29  Today's Date: 03/04/2024 Time: SLP Start Time (ACUTE ONLY): 1430 SLP Stop Time (ACUTE ONLY): 1443 SLP Time Calculation (min) (ACUTE ONLY): 13 min  Past Medical History:  Past Medical History:  Diagnosis Date   Alcohol abuse    Anemia    Arthritis    CHF (congestive heart failure) (HCC)    Cirrhosis of liver (HCC)    Cirrhosis, alcoholic (HCC)    last etoh 3/13-sees dr abran   Colon polyps    Esophageal varices (HCC)    GERD (gastroesophageal reflux disease)    H/O sleep apnea    Hyperlipidemia    Hypertension    Sleep apnea    had surgery to correct snoring 2001   Vocal cord paralysis    left   Past Surgical History:  Past Surgical History:  Procedure Laterality Date   APPENDECTOMY     COLONOSCOPY  2013   HERNIA REPAIR     INSERTION OF MESH  04/21/2012   Procedure: INSERTION OF MESH;  Surgeon: Vicenta DELENA Poli, MD;  Location: [facility];  Service: General;  Laterality: N/A;   MOUTH SURGERY  7/13   ORIF ANKLE FRACTURE Left 02/12/2014   Procedure: OPEN REDUCTION INTERNAL FIXATION (ORIF) LEFT ANKLE FRACTURE ;  Surgeon: Norleen Armor, MD;  Location: [facility] OR;  Service: Orthopedics;  Laterality: Left;   PERCUTANEOUS PINNING Left 02/12/2014   Procedure: Closed Reduction  PERCUTANEOUS PINNING left great toe;  Surgeon: Norleen Armor, MD;  Location: [facility] OR;  Service: Orthopedics;  Laterality: Left;   ROTATOR CUFF REPAIR Bilateral    SHOULDER OPEN ROTATOR CUFF REPAIR     2 on right shoulder, 1 on left shoulder   THROAT SURGERY     surgery to help with snoring   UMBILICAL HERNIA REPAIR  04/21/2012   Procedure: HERNIA REPAIR UMBILICAL ADULT;  Surgeon: Vicenta DELENA Poli, MD;  Location: [facility];  Service: General;  Laterality: N/A;  umbilical hernia repair with mesh   UMBILICAL HERNIA REPAIR     UPPER GI ENDOSCOPY  2013    UVULOPALATOPHARYNGOPLASTY  2001   UVULOPALATOPHARYNGOPLASTY     HPI:  [name] [name] is a 73 y.o. male with hx of alcohol abuse, CHF, cirrhosis of the liver, esophageal varices, GERD, sleep apnea, hypertension, hyperlipidemia, sleep apnea, and vocal cord paralysis on the left who presents via EMS to the ED as a code stroke.  TNK administered. 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    Assessment / Plan / Recommendation  Clinical Impression  Pt was seen for a limited clinical swallow evaluation to assess ability to take oral medications.  Full evaluation was not completed at this time secondary to possible orthopedic procedure later today.  Pt was seen with ice chip x1, thin liquid via straw x3, and puree x1.  He was able to feed himself independently and no overt s/sx of aspiration were observed with any trials.  Of note, pt has a documented hx of dysphagia and silent aspiration of thin liquids in 2015, but stated that he consumes regular solids and thin liquids at home.  Pt may have oral medications whole in puree.  Will defer diet recommendations until a more comprehensive clinical swallow evaluation can be completed.  SLP will plan to f/u tomorrow (11/3).  SLP Visit Diagnosis: Dysphagia, unspecified (R13.10)    Aspiration Risk  Mild aspiration risk    Diet Recommendation NPO except meds    Medication Administration: Whole meds with puree    Other  Recommendations       Assistance Recommended at Discharge    Functional Status Assessment Patient has had a recent decline in their functional status and demonstrates the ability to make significant improvements in function in a reasonable and predictable amount of time.  Frequency and Duration min 2x/week  2 weeks       Prognosis Prognosis for improved oropharyngeal  function: Good      Swallow Study   General Date of Onset: 03/04/24 HPI: [name] [name] is a 73 y.o. male with hx of alcohol abuse, CHF, cirrhosis of the liver, esophageal varices, GERD, sleep apnea, hypertension, hyperlipidemia, sleep apnea, and vocal cord paralysis on the left who presents via EMS to the ED as a code stroke.  TNK administered. 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 Type of Study: Bedside Swallow Evaluation Previous Swallow Assessment: See HPI Diet Prior to this Study: NPO Temperature Spikes Noted: Yes Respiratory Status: Room air History of Recent Intubation: No Behavior/Cognition: Alert;Cooperative;Pleasant mood Oral Cavity Assessment: Within Functional Limits Oral Care Completed by SLP: No Oral Cavity - Dentition: Dentures, top (Pt unable to state if he had bottom dentures, but they appeared to be dentures) Vision: Functional for self-feeding Self-Feeding Abilities: Able to feed self Patient Positioning: Upright in bed Baseline Vocal Quality: Normal Volitional Cough: Strong Volitional Swallow: Able to elicit    Oral/Motor/Sensory Function Overall Oral Motor/Sensory Function: Within functional limits   Ice Chips Ice chips: Within functional limits Presentation: Spoon   Thin Liquid Thin Liquid: Within functional limits Presentation: Straw    Nectar Thick Nectar Thick Liquid: Not tested   Honey Thick Honey Thick Liquid: Not tested   Puree Puree: Within functional limits Presentation: Spoon   Solid     Solid: Not tested     Earnie Cable, M.S., CCC-SLP Acute Rehabilitation Services Office: [phone]  Earnie SQUIBB Eivin Mascio 03/04/2024,3:04 PM

## 2024-03-04 NOTE — Progress Notes (Signed)
  Echocardiogram 2D Echocardiogram has been performed.  [name] [name] 03/04/2024, 11:04 AM

## 2024-03-04 NOTE — Evaluation (Signed)
 Speech Language Pathology Evaluation Patient Details Name: [name] [name] MRN: [id number] DOB: April 12, 1951 Today's Date: 03/04/2024 Time: 8555-8541 SLP Time Calculation (min) (ACUTE ONLY): 14 min  Problem List:  Patient Active Problem List   Diagnosis Date Noted   Stroke (cerebrum) (HCC) 03/03/2024   Nonrheumatic aortic (valve) stenosis 09/21/2023   Pure hypercholesterolemia 09/21/2023   Hypertensive heart disease 08/02/2022   Pre-op evaluation 08/02/2022   Heart failure, type unknown (HCC) 08/02/2022   LVH (left ventricular hypertrophy) 08/02/2022   Systolic murmur 08/02/2022   GERD (gastroesophageal reflux disease) 06/30/2018   Hypertensive urgency 06/30/2018   Trimalleolar fracture of left ankle 03/08/2014   Dysphagia, oropharyngeal phase 03/08/2014   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) 03/05/2014   Acute delirium 02/24/2014   Blunt chest trauma 02/09/2014   Fracture of lumbar spine (HCC) 02/09/2014   Multiple rib fractures involving four or more ribs 02/09/2014   Traumatic pneumothorax 02/09/2014   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   History of osteopenia 10/13/2012   Compression fracture of L1 lumbar vertebra (HCC) 10/13/2012   Alcohol abuse 10/13/2012   Esophageal varices (HCC) 01/17/2012   History of colonic polyps 10/29/2011   Cirrhosis (HCC) 10/29/2011   Nonspecific abnormal finding in stool contents 07/26/2011   Lower extremity weakness 07/21/2011   Anemia 07/21/2011   Abnormal LFTs 07/21/2011   Heart failure with improved ejection fraction (HFimpEF) (HCC) 07/21/2011   Moderate protein-calorie malnutrition 07/21/2011   Past Medical History:  Past Medical History:  Diagnosis Date   Alcohol abuse    Anemia    Arthritis    CHF (congestive heart failure) (HCC)    Cirrhosis of liver (HCC)    Cirrhosis, alcoholic (HCC)    last etoh 3/13-sees dr abran   Colon polyps    Esophageal varices (HCC)    GERD  (gastroesophageal reflux disease)    H/O sleep apnea    Hyperlipidemia    Hypertension    Sleep apnea    had surgery to correct snoring 2001   Vocal cord paralysis    left   Past Surgical History:  Past Surgical History:  Procedure Laterality Date   APPENDECTOMY     COLONOSCOPY  2013   HERNIA REPAIR     INSERTION OF MESH  04/21/2012   Procedure: INSERTION OF MESH;  Surgeon: Vicenta DELENA Poli, MD;  Location: [facility];  Service: General;  Laterality: N/A;   MOUTH SURGERY  7/13   ORIF ANKLE FRACTURE Left 02/12/2014   Procedure: OPEN REDUCTION INTERNAL FIXATION (ORIF) LEFT ANKLE FRACTURE ;  Surgeon: Norleen Armor, MD;  Location: [facility] OR;  Service: Orthopedics;  Laterality: Left;   PERCUTANEOUS PINNING Left 02/12/2014   Procedure: Closed Reduction  PERCUTANEOUS PINNING left great toe;  Surgeon: Norleen Armor, MD;  Location: [facility] OR;  Service: Orthopedics;  Laterality: Left;   ROTATOR CUFF REPAIR Bilateral    SHOULDER OPEN ROTATOR CUFF REPAIR     2 on right shoulder, 1 on left shoulder   THROAT SURGERY     surgery to help with snoring   UMBILICAL HERNIA REPAIR  04/21/2012   Procedure: HERNIA REPAIR UMBILICAL ADULT;  Surgeon: Vicenta DELENA Poli, MD;  Location: [facility];  Service: General;  Laterality: N/A;  umbilical hernia repair with mesh   UMBILICAL HERNIA REPAIR     UPPER GI ENDOSCOPY  2013   UVULOPALATOPHARYNGOPLASTY  2001   UVULOPALATOPHARYNGOPLASTY     HPI:  [name] [name] is a 73 y.o. male with hx of alcohol abuse, CHF, cirrhosis of the liver, esophageal varices, GERD, sleep apnea, hypertension, hyperlipidemia, sleep apnea, and vocal cord paralysis on the left who presents via EMS to the ED as a code stroke.  TNK administered. 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    Assessment / Plan / Recommendation Clinical Impression  Pt was seen for a cognitive-linguistic evaluation and presents with a mixed expressive/receptive aphasia, most consistent with Wernicke's Aphasia.  Pt's speech was fluent, but he exhibited difficulty with yes/no questions, following 1-2 step commands, confrontational naming, and word/phrase repetition.  Phonemic paraphasias were noted throughout this evaluation, but pt was unaware of paraphasias.  Mild dysarthria was additionally noted with suspected mild hyponasality.  Unable to evaluate cognition at this time secondary to language deficits. Recommend continued ST acutely and at time of discharge.    SLP Assessment  SLP Recommendation/Assessment: Patient needs continued Speech Language Pathology Services SLP Visit Diagnosis: Aphasia (R47.01)     Assistance Recommended at Discharge  Frequent or constant Supervision/Assistance  Functional Status Assessment Patient has had a recent decline in their functional status and demonstrates the ability to make significant improvements in function in a reasonable and predictable amount of time.  Frequency and Duration min 2x/week  2 weeks      SLP Evaluation Cognition  Overall Cognitive Status: No family/caregiver present to determine baseline cognitive functioning Arousal/Alertness: Awake/alert Orientation Level: Oriented to person;Disoriented to time;Disoriented to situation;Oriented to place Comments: Unable to complete cognitive evaluation secondary to language deficits       Comprehension  Auditory Comprehension Overall Auditory Comprehension: Impaired Yes/No Questions: Impaired Basic Biographical Questions: 76-100% accurate Basic Immediate Environment Questions: 50-74% accurate Complex Questions: 0-24% accurate Commands: Impaired One Step Basic Commands: 50-74% accurate Two Step Basic Commands: 25-49% accurate Conversation: Simple Interfering Components:  Hearing EffectiveTechniques: Extra processing time;Repetition    Expression Expression Primary Mode of Expression: Verbal Verbal Expression Overall Verbal Expression: Impaired Initiation: No impairment Automatic Speech: Social Response Level of Generative/Spontaneous Verbalization: Sentence Repetition: Impaired Level of Impairment: Word level;Phrase level Naming: Impairment Confrontation: Impaired Verbal Errors: Phonemic paraphasias;Not aware of errors Pragmatics: No impairment Written Expression Written Expression: Not tested   Oral / Motor  Oral Motor/Sensory Function Overall Oral Motor/Sensory Function: Within functional limits Motor Speech Overall Motor Speech: Impaired Phonation: Normal Resonance:  (Sounded slightly hyponasal) Articulation: Impaired Level of Impairment: Word Intelligibility: Intelligibility reduced Word: 75-100% accurate           Earnie Cable, M.S., CCC-SLP Acute Rehabilitation Services Office: [phone]  Earnie SQUIBB [facility] 03/04/2024, 3:16 PM

## 2024-03-04 NOTE — Progress Notes (Signed)
 SLP Cancellation Note  Patient Details Name: [name] [name] MRN: [id number] DOB: [birth year]   Cancelled treatment:       Reason Eval/Treat Not Completed: Patient at procedure or test/unavailable;Medical issues which prohibited therapy. Pt leaving the floor for CT and MRI. Pt also awaiting ortho consult based on findings. SLP will f/u for evaluations as able.    Leita SAILOR., M.A. CCC-SLP Acute Rehabilitation Services Office: [phone]  Secure chat preferred  03/04/2024, 10:53 AM

## 2024-03-04 NOTE — Progress Notes (Addendum)
 STROKE TEAM PROGRESS NOTE    SIGNIFICANT HOSPITAL EVENTS 11/1 presented with confusion, aphasia and right side weakness. Received IV TNK @ 1630  INTERIM HISTORY/SUBJECTIVE Patient admitted to the Neuro ICU post TNK. Pelvic xray with right femur fracture.  Will consult Ortho MRI today for post TNK.  Patient is calm and confused, able to answer some questions, follows commands.  Right leg movement is limited due to pain and femur fracture otherwise no focal deficits If remains stable can likely transfer out of the ICU this afternoon  CBC    Component Value Date/Time   WBC 21.2 (H) 03/03/2024 1546   RBC 4.28 03/03/2024 1546   HGB 15.0 03/03/2024 1551   HCT 44.0 03/03/2024 1551   PLT 361 03/03/2024 1546   MCV 95.8 03/03/2024 1546   MCH 31.5 03/03/2024 1546   MCHC 32.9 03/03/2024 1546   RDW 13.3 03/03/2024 1546   LYMPHSABS 1.0 03/03/2024 1546   MONOABS 1.1 (H) 03/03/2024 1546   EOSABS 0.0 03/03/2024 1546   BASOSABS 0.1 03/03/2024 1546    BMET    Component Value Date/Time   NA 133 (L) 03/03/2024 1551   NA 137 10/07/2023 1311   K 4.0 03/03/2024 1551   CL 100 03/03/2024 1551   CO2 18 (L) 03/03/2024 1546   GLUCOSE 212 (H) 03/03/2024 1551   BUN 17 03/03/2024 1551   BUN 22 10/07/2023 1311   CREATININE 1.10 03/03/2024 1551   CALCIUM 9.0 03/03/2024 1546   EGFR 30.0 01/20/2024 0936   EGFR 69 10/07/2023 1311   GFRNONAA >60 03/03/2024 1546    IMAGING past 24 hours DG Pelvis Portable Result Date: 03/03/2024 EXAM: 1 or 2 VIEW(S) XRAY OF THE PELVIS 03/03/2024 05:20:00 PM COMPARISON: None available. CLINICAL HISTORY: fall fall FINDINGS: BONES AND JOINTS: Right femoral intertrochanteric fracture with displacement and varus angulation. No subluxation or dislocation. Mild symmetric degenerative changes in the hips. SOFT TISSUES: The soft tissues are unremarkable. IMPRESSION: 1. Right femoral intertrochanteric fracture with displacement and varus angulation. Electronically signed by: Franky Crease MD 03/03/2024 05:29 PM EDT RP Workstation: [id number]   DG Chest Portable 1 View Result Date: 03/03/2024 CLINICAL DATA:  Fall. EXAM: PORTABLE CHEST 1 VIEW COMPARISON:  06/30/2018. FINDINGS: The heart is enlarged and the mediastinal contour is within normal limits. Stable interstitial prominence is noted bilaterally. No consolidation, effusion, or pneumothorax is seen. There are fractures of the T2 through T4 ribs on the right. Old healed rib fractures are noted on the left. IMPRESSION: 1. Fractures of the T2 through T4 ribs on the right. 2. Cardiomegaly. Electronically Signed   By: Leita Birmingham M.D.   On: 03/03/2024 16:53   CT C-SPINE NO CHARGE Result Date: 03/03/2024 EXAM: CT CERVICAL SPINE WITH CONTRAST 03/03/2024 04:14:17 PM TECHNIQUE: CT of the cervical spine was performed with the administration of 75 mL of iohexol  (OMNIPAQUE ) 350 MG/ML injection. The cervical spine images were reconstructed from the concomitant CTA of the neck. Multiplanar reformatted images are provided for review. Automated exposure control, iterative reconstruction, and/or weight based adjustment of the mA/kV was utilized to reduce the radiation dose to as low as reasonably achievable. COMPARISON: None available. CLINICAL HISTORY: FINDINGS: CERVICAL SPINE: BONES AND ALIGNMENT: No acute fracture or traumatic malalignment. DEGENERATIVE CHANGES: C5-C6 and C6-C7 disc space narrowing without spinal canal stenosis. Bulky right anterior osteophytes at the C5-C7 levels. Upper cervical predominant facet arthrosis. SOFT TISSUES: No prevertebral soft tissue swelling. IMPRESSION: 1. No acute cervical spinal abnormality. Electronically signed by: Franky Stanford MD  03/03/2024 04:26 PM EDT RP Workstation: [id number]   CT ANGIO HEAD NECK W WO CM (CODE STROKE) Result Date: 03/03/2024 EXAM: CTA Head and Neck with Intravenous Contrast. CT Head without Contrast. CLINICAL HISTORY: Neuro deficit, acute, stroke suspected. TECHNIQUE: Axial CTA images  of the head and neck performed with intravenous contrast. MIP reconstructed images were created and reviewed. Axial computed tomography images of the head/brain performed without intravenous contrast. Note: Per PQRS, the description of internal carotid artery percent stenosis, including 0 percent or normal exam, is based on [employer] American Symptomatic Carotid Endarterectomy Trial (NASCET) criteria. Dose reduction technique was used including one or more of the following: automated exposure control, adjustment of mA and kV according to patient size, and/or iterative reconstruction. CONTRAST: Without and with; 75 mL iohexol  (OMNIPAQUE ) 350 MG/ML injection. COMPARISON: None provided. FINDINGS: CT HEAD: BRAIN: No acute intraparenchymal hemorrhage. No mass lesion. No CT evidence for acute territorial infarct. No midline shift or extra-axial collection. Intracranial images are mildly degraded by motion. VENTRICLES: No hydrocephalus. ORBITS: The orbits are unremarkable. SINUSES AND MASTOIDS: The paranasal sinuses and mastoid air cells are clear. CTA NECK: COMMON CAROTID ARTERIES: Mixed density atherosclerotic disease at both carotid bifurcations without hemodynamically significant stenosis. No dissection or occlusion. INTERNAL CAROTID ARTERIES: Mixed density atherosclerotic disease at both carotid bifurcations without hemodynamically significant stenosis. No dissection or occlusion. VERTEBRAL ARTERIES: Codominant vertebral arteries. Mild narrowing of the right vertebral artery origin and minimal atherosclerotic calcification of both V4 segments. Vertebral arteries otherwise normal. No dissection or occlusion. CTA HEAD: ANTERIOR CEREBRAL ARTERIES: No significant stenosis. No occlusion. No aneurysm. MIDDLE CEREBRAL ARTERIES: No significant stenosis. No occlusion. No aneurysm. POSTERIOR CEREBRAL ARTERIES: No significant stenosis. No occlusion. No aneurysm. BASILAR ARTERY: No significant stenosis. No occlusion. No aneurysm.  OTHER: Atherosclerotic calcification of the cavernous segments of both internal carotid arteries without high grade stenosis. SOFT TISSUES: No acute finding. No masses or lymphadenopathy. BONES: Calcific aortic atherosclerosis. No acute osseous abnormality. IMPRESSION: 1. No emergent large vessel occlusion. 2. Atherosclerotic disease at both carotid bifurcations without hemodynamically significant stenosis. Electronically signed by: Franky Stanford MD 03/03/2024 04:24 PM EDT RP Workstation: [id number]   CT HEAD CODE STROKE WO CONTRAST Result Date: 03/03/2024 EXAM: CT HEAD WITHOUT CONTRAST 03/03/2024 03:55:48 PM TECHNIQUE: CT of the head was performed without the administration of intravenous contrast. Automated exposure control, iterative reconstruction, and/or weight based adjustment of the mA/kV was utilized to reduce the radiation dose to as low as reasonably achievable. COMPARISON: 10/02/2019 CLINICAL HISTORY: Neuro deficit, acute, stroke suspected. FINDINGS: BRAIN AND VENTRICLES: No acute hemorrhage. No evidence of acute infarct. No hydrocephalus. No extra-axial collection. No mass effect or midline shift. Chronic ischemic white matter changes. Old left frontal infarct. [place] Stroke Program Early CT Score (ASPECTS) ----- Ganglionic (caudate, ic, lentiform nucleus, insula, M1-m3): 7 Supraganglionic (m4-m6): 3 Total: 10 ORBITS: No acute abnormality. SINUSES: No acute abnormality. SOFT TISSUES AND SKULL: No acute soft tissue abnormality. No skull fracture. IMPRESSION: 1. No acute intracranial abnormality. 2. Old left frontal infarct and chronic ischemic white matter changes. 3. ASPECTS: 10. Findings communicated to Dr. Eligio Lav at 4:06 pm on 03/03/24 Electronically signed by: Franky Stanford MD 03/03/2024 04:11 PM EDT RP Workstation: [id number]    Vitals:   03/04/24 0500 03/04/24 0600 03/04/24 0700 03/04/24 0712  BP: (!) 165/101 (!) 167/100  (!) 143/96  Pulse: (!) 120 (!) 117 89 (!) 119  Resp: (!) 25 (!)  31 19 (!) 38  Temp:      TempSrc:  SpO2: 96% 100% (!) 89% 92%  Weight:         PHYSICAL EXAM General:  Alert, well-nourished, well-developed patient in no acute distress Psych:  Mood and affect appropriate for situation CV: Regular rate and rhythm on monitor Respiratory:  Regular, unlabored respirations on room air GI: Abdomen soft and nontender   NEURO:  Mental Status: Awake and alert, confused, able to follow commands.  Very hard of hearing.  Some mild aphasia  Cranial Nerves:  II: PERRL. Visual fields full.  III, IV, VI: EOMI. Eyelids elevate symmetrically.  V: Sensation is intact to light touch and symmetrical to face.  VII: Face is symmetrical resting and smiling VIII: Very hard of hearing IX, X: Palate elevates symmetrically. Phonation is normal.  KP:Dynloizm shrug 5/5. XII: tongue is midline without fasciculations. Motor: Moves bilateral uppers and left leg spontaneous and antigravity, right leg minimal movement due to right femur fracture can wiggle toes Tone: is normal and bulk is normal Sensation- Intact to light touch bilaterally. Extinction absent to light touch to DSS.   Coordination: FTN intact bilaterally, HKS: no ataxia in BLE.No drift.  Gait- deferred  Most Recent NIH 8   ASSESSMENT/PLAN  [name] is a 73 y.o. 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  Per EMS will check was called and EMS found him on the floor with a laceration on his head, noted to be confused agitated, with left gaze preference and hypertensive at 240/110. NIH on Admission 18  Acute Ischemic Infarct:  left hemisphere s/p TNK with fall and right hip femur fracture Etiology: Workup underway Code Stroke  CT head No acute abnormality. ASPECTS 10.  Old left frontal infarct and chronic  ischemic white matter changes  CTA head & neck No LVO Atherosclerotic disease at both carotid bifurcations without hemodynamically significant stenosis  MRI ordered Carotid Doppler   2D Echo EF > 75%.  Mild LVH with grade 1 diastolic dysfunction LDL 127 HgbA1c 5.3 VTE prophylaxis -SCDs No antithrombotic prior to admission, now on No antithrombotic for 24 hours post TNK and 24-hour brain imaging negative for hemorrhage Therapy recommendations:  Pending Disposition: Pending  Hx of Stroke/TIA Old left frontal infarct on brain imaging  S/p unwitnessed fall Acute right femur fracture Pelvis x-ray Right femoral intertrochanteric fracture with displacement and varus angulation. CT chest abdomen pelvis:  1. Moderately displaced and comminuted intertrochanteric fracture of the proximal right femur, acute. 2. Old bilateral rib fractures, chronic. 3. Chronic L1 vertebral body compression fracture, chronic. 4. Status post appendectomy. 5. Aortic atherosclerosis. 6. Coronary artery calcifications. 7. Diverticulosis of the descending and sigmoid colon without evidence of inflammation. 8. Possible bilateral nephrolithiasis. Ortho consulted   Hypertension CHF Home meds: Amlodipine  5 mg, propranolol  20 mg Stable Home meds on hold currently Blood Pressure Goal: BP less than 180/105   Hyperlipidemia Home meds: Zetia  10 mg,  resumed in hospital LDL 127, goal < 70 Continue statin at discharge  Substance Abuse History of EtOH abuse History of liver cirrhosis, esophageal varices UDS positive ordered ETOH use, alcohol level <15, advised to drink no more than 2 drink(s) a day TOC consult for cessation placed  Dysphagia Patient has post-stroke dysphagia, SLP consulted    Diet   Diet NPO time specified   Advance diet as tolerated  Other Stroke Risk Factors Obesity, Body mass index is 31.95 kg/m., BMI >/= 30 associated with increased stroke risk, recommend weight loss, diet and exercise  as appropriate  Coronary artery disease Congestive heart failure Obstructive sleep apnea  Other Active Problems  Hospital day # 1  Karna Geralds DNP, ACNPC-AG  [facility]  I have personally obtained history,examined this patient, reviewed notes, independently viewed imaging studies, participated in medical decision making and plan of care.ROS completed by me personally and pertinent positives fully documented  I have made any additions or clarifications directly to the above note. Agree with note above.  Patient presented with confusion and gaze deviation and right-sided weakness due to suspected left brain infarct and received IV TNK and has shown significant improvement but unfortunately had a fall and has right femur fracture which will need treatment soon.  Continue close neurological observation and strict blood pressure control as per post TNK protocol.  Pain medication for humeral fracture.  Consult orthopedics for management of humeral fracture.  Continue ongoing stroke workup.  No family available at the bedside for discussion. This patient is critically ill and at significant risk of neurological worsening, death and care requires constant monitoring of vital signs, hemodynamics,respiratory and cardiac monitoring, extensive review of multiple databases, frequent neurological assessment, discussion with family, other specialists and medical decision making of high complexity.I have made any additions or clarifications directly to the above note.This critical care time does not reflect procedure time, or teaching time or supervisory time of PA/NP/Med Resident etc but could involve care discussion time.  I spent 40 minutes of neurocritical care time  in the care of  this patient.      Eather Popp, MD Medical Director [facility] Stroke Center Pager: [phone] 03/04/2024 1:58 PM  To contact Stroke Continuity provider, please refer to [email or web address]. After hours, contact  General Neurology

## 2024-03-04 NOTE — Progress Notes (Signed)
 OT Cancellation Note  Patient Details Name: [name] [name] MRN: [id number] DOB: 02/07/1951   Cancelled Treatment:    Reason Eval/Treat Not Completed: Active bedrest order  Avrielle Fry K, OTD, OTR/L SecureChat Preferred Acute Rehab [phone]   Laneta MARLA Pereyra 03/04/2024, 6:43 AM

## 2024-03-05 DIAGNOSIS — Z01810 Encounter for preprocedural cardiovascular examination: Secondary | ICD-10-CM | POA: Diagnosis not present

## 2024-03-05 DIAGNOSIS — R410 Disorientation, unspecified: Secondary | ICD-10-CM

## 2024-03-05 DIAGNOSIS — R4701 Aphasia: Secondary | ICD-10-CM

## 2024-03-05 DIAGNOSIS — I350 Nonrheumatic aortic (valve) stenosis: Secondary | ICD-10-CM | POA: Diagnosis not present

## 2024-03-05 DIAGNOSIS — R29818 Other symptoms and signs involving the nervous system: Secondary | ICD-10-CM

## 2024-03-05 DIAGNOSIS — I4719 Other supraventricular tachycardia: Secondary | ICD-10-CM

## 2024-03-05 DIAGNOSIS — I6340 Cerebral infarction due to embolism of unspecified cerebral artery: Secondary | ICD-10-CM

## 2024-03-05 LAB — TROPONIN I (HIGH SENSITIVITY): Troponin I (High Sensitivity): 397 ng/L (ref <18)

## 2024-03-05 MED ORDER — CEFAZOLIN SODIUM-DEXTROSE 2-4 GM/100ML-% IV SOLN: 2.0000 g | INTRAVENOUS | Status: AC

## 2024-03-05 MED ORDER — ATORVASTATIN CALCIUM 40 MG PO TABS
40.0000 mg | ORAL_TABLET | Freq: Every day | ORAL | Status: DC
  Administered 2024-03-05 – 2024-03-14 (×9): 40 mg via ORAL
  Filled 2024-03-05 (×9): qty 1

## 2024-03-05 MED ORDER — TRANEXAMIC ACID-NACL 1000-0.7 MG/100ML-% IV SOLN
1000.0000 mg | INTRAVENOUS | Status: AC
  Filled 2024-03-05: qty 100

## 2024-03-05 MED ORDER — ASPIRIN 81 MG PO TBEC
81.0000 mg | DELAYED_RELEASE_TABLET | Freq: Every day | ORAL | Status: DC
  Administered 2024-03-05: 81 mg via ORAL
  Filled 2024-03-05: qty 1

## 2024-03-05 MED ORDER — HEPARIN (PORCINE) 25000 UT/250ML-% IV SOLN
1000.0000 [IU]/h | INTRAVENOUS | Status: DC
  Administered 2024-03-05: 1000 [IU]/h via INTRAVENOUS
  Filled 2024-03-05: qty 250

## 2024-03-05 MED ORDER — DIPHENHYDRAMINE HCL 50 MG/ML IJ SOLN
25.0000 mg | Freq: Once | INTRAMUSCULAR | Status: AC
  Administered 2024-03-06: 25 mg via INTRAVENOUS
  Filled 2024-03-05: qty 1

## 2024-03-05 MED ORDER — AMIODARONE HCL 200 MG PO TABS
200.0000 mg | ORAL_TABLET | Freq: Two times a day (BID) | ORAL | Status: DC
  Administered 2024-03-05 – 2024-03-13 (×17): 200 mg via ORAL
  Filled 2024-03-05 (×18): qty 1

## 2024-03-05 MED ORDER — BISOPROLOL FUMARATE 10 MG PO TABS
10.0000 mg | ORAL_TABLET | Freq: Every day | ORAL | Status: DC
  Administered 2024-03-05 – 2024-03-14 (×10): 10 mg via ORAL
  Filled 2024-03-05 (×10): qty 1

## 2024-03-05 NOTE — Progress Notes (Signed)
 Speech Language Pathology Treatment: Dysphagia;Cognitive-Linguistic  Patient Details Name: [name] [name] MRN: [id number] DOB: October 09, 1950 Today's Date: 03/05/2024 Time: 9151-9089 SLP Time Calculation (min) (ACUTE ONLY): 22 min  Assessment / Plan / Recommendation Clinical Impression  Dysphagia: Pt was seen with trials of thin liquid, puree, and regular solids to determine readiness for clinical diet initiation.  He exhibited mildly prolonged mastication of regular solids with an immediate throat clear and delayed cough with mild diffuse oral residue noted.  Pt was able to clear residue with a cued liquid wash.  No overt s/sx of aspiration were observed with thin liquid or puree; however, pt was noted to be very impulsive with intake and was not able to adhere to aspiration precautions well secondary to receptive language deficits.  Per surgical notes, pt is scheduled for a procedure tomorrow (11/4) and will be NPO at midnight tonight.  Spoke with MD and he stated that he would like for the pt to initiate a diet today until NPO order is effective for surgery.  Given pt's impulsivity recommend starting with Dysphagia 1 (puree) solids and thin liquids with medication administered whole in puree.  SLP will f/u after procedure to determine if pt is appropriate for a diet upgrade.    Cognitive-linguistic: Pt continues to present with a mixed expressive/receptive aphasia with phonemic paraphasias in both structured and unstructured tasks.  Pt completed multiple tasks with varying accuracy including: yes/no questions (70%), confrontational naming (65%), automatic speech (35%), and word/phrase repetition (65%).  No dysarthria noted during this session.  Pt appeared to be more aware of and frustrated by receptive language deficits on this date, stating that he did not understand multiple times throughout tasks and conversational speech.  Pt benefited from simple conversation and repetition with visual cues to aid in  understanding.  Phonemic cues were not beneficial for paraphasias on this date.  Recommend continued speech therapy acutely and following discharge.  Pt will additionally benefit from full supervision and assistance with IADLs at time of discharge.     HPI HPI: [name] [name] is a 73 y.o. male with hx of alcohol abuse, CHF, cirrhosis of the liver, esophageal varices, GERD, sleep apnea, hypertension, hyperlipidemia, sleep apnea, and vocal cord paralysis on the left who presents via EMS to the ED as a code stroke.  TNK administered. 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      SLP Plan  Continue with current plan of care          Recommendations  Diet recommendations: Thin liquid;Dysphagia 1 (puree) Liquids provided via: Cup;Straw Medication Administration: Whole meds with puree Supervision: Patient able to self feed;Intermittent supervision to cue for compensatory strategies Compensations: Minimize environmental distractions;Slow rate;Small sips/bites                  Oral care BID   Frequent or constant Supervision/Assistance Aphasia (R47.01)     Continue with current plan of care    Earnie Cable, M.S., CCC-SLP Acute Rehabilitation Services Office: [phone]  Earnie SQUIBB [facility]  03/05/2024, 9:55 AM

## 2024-03-05 NOTE — Progress Notes (Signed)
 PHARMACY - ANTICOAGULATION CONSULT NOTE  Pharmacy Consult for heparin   Indication: atrial fibrillation  Allergies  Allergen Reactions   Adhesive [Tape] Other (See Comments)    REACTION: blisters.  Use paper tape only   Amlodipine  Other (See Comments)    REACTION:  unknown   Amoxicillin Hives    Did it involve swelling of the face/tongue/throat, SOB, or low BP? No Did it involve sudden or severe rash/hives, skin peeling, or any reaction on the inside of your mouth or nose? No Did you need to seek medical attention at a hospital or doctor's office? No When did it last happen? childhood       If all above answers are "NO", may proceed with cephalosporin use.    Atenolol  Other (See Comments)    REACTION:  unknown   Celebrex [Celecoxib] Nausea And Vomiting    Also Vioxx   Cephalosporins Hives   Clindamycin/Lincomycin Hives   Dexon [Dexamethasone  Sodium Phosphate ] Other (See Comments)    REACTION:  unknown   Doxazosin Mesylate Other (See Comments)    REACTION: unknown   Duraprep [Antiseptic Products, Misc.]    Enalapril Other (See Comments)    REACTION: dry cough   Erythromycin Hives   Hctz [Hydrochlorothiazide ] Other (See Comments)    REACTION: severe sensitivity to light, burning sensation   Iodine     AGENT: Duraprep REACTION:  blisters   Lipitor [Atorvastatin]    Other     AGENT:Vicryl, cephalnxon REACTION:  body rejects it   Toprol  Xl [Metoprolol  Succinate]    Verapamil Other (See Comments)    REACTION:  Dry cough   Vioxx [Rofecoxib] Hives   Zocor [Simvastatin] Nausea And Vomiting   Latex Rash    Patient Measurements: Weight: 89.8 kg (197 lb 15.6 oz)  Vital Signs: Temp: 98.8 F (37.1 C) (11/03 0800) Temp Source: Oral (11/03 0800) BP: 153/74 (11/03 0900) Pulse Rate: 103 (11/03 0900)  Labs: Recent Labs    03/03/24 1546 03/03/24 1551 03/04/24 1503 03/04/24 1644 03/04/24 2251  HGB 13.5 15.0 11.0*  --   --   HCT 41.0 44.0 33.5*  --   --   PLT 361  --   294  --   --   APTT 27  --   --   --   --   LABPROT 13.7  --   --   --   --   INR 1.0  --   --   --   --   CREATININE 1.13 1.10 1.92*  --   --   TROPONINIHS  --   --   --  366* 397*    Estimated Creatinine Clearance: 35.4 mL/min (A) (by C-G formula based on SCr of 1.92 mg/dL (H)).   Medical History: Past Medical History:  Diagnosis Date   Alcohol abuse    Anemia    Arthritis    CHF (congestive heart failure) (HCC)    Cirrhosis of liver (HCC)    Cirrhosis, alcoholic (HCC)    last etoh 3/13-sees dr abran   Colon polyps    Esophageal varices (HCC)    GERD (gastroesophageal reflux disease)    H/O sleep apnea    Hyperlipidemia    Hypertension    Sleep apnea    had surgery to correct snoring 2001   Vocal cord paralysis    left    Assessment: Patient admitted with CC of AIS. Found to have atrial flutter vs. Atrial tachycardia. HgB 11.0 and PLTs 294.   Goal  of Therapy:  Heparin  level 0.3-0.7 units/ml   Plan:  Started briefly on heparin  1000u/hr.  Discussed with neurology that cardiology recommended against anticoagulation in the setting of EtOh use and atrial flutter vs. atrial fibrillation.  D/c heparin  gtt, start aspirin  81.  F/u DVT ppx resumption after OR on 11/4.   Powell Blush, PharmD, BCCCP  03/05/2024,9:21 AM

## 2024-03-05 NOTE — Progress Notes (Signed)
 OT Cancellation Note  Patient Details Name: ANNETTE LIOTTA MRN: [id number] DOB: 07-25-1950   Cancelled Treatment:    Reason Eval/Treat Not Completed: Medical issues which prohibited therapy;Other (comment) (Planning for operative stabilization 11/4; OT evaluation to f/u 11/5 post-op)  Lucie JONETTA Kendall 03/05/2024, 9:20 AM

## 2024-03-05 NOTE — Progress Notes (Addendum)
 Cardiologist:  [place]  Subjective:  Denies SSCP, palpitations or Dyspnea   Objective:  Vitals:   03/05/24 0400 03/05/24 0500 03/05/24 0600 03/05/24 0800  BP: (!) 169/99 (!) 163/82 (!) 161/82   Pulse: 98 (!) 103 84   Resp: (!) 24 (!) 22 (!) 26   Temp: 99 F (37.2 C)   98.8 F (37.1 C)  TempSrc: Axillary   Oral  SpO2: 95% 96% 97%   Weight:        Intake/Output from previous day:  Intake/Output Summary (Last 24 hours) at 03/05/2024 0839 Last data filed at 03/05/2024 0600 Gross per 24 hour  Intake 2222.09 ml  Output 475 ml  Net 1747.09 ml    Physical Exam:  Not very communicative AS murmur Lungs clear Abdomen benign Right femur fracture Plus one edema  Lab Results: Basic Metabolic Panel: Recent Labs    03/03/24 1546 03/03/24 1551 03/04/24 1503  NA 132* 133* 135  K 4.0 4.0 3.3*  CL 96* 100 104  CO2 18*  --  15*  GLUCOSE 218* 212* 153*  BUN 14 17 32*  CREATININE 1.13 1.10 1.92*  CALCIUM 9.0  --  8.4*   Liver Function Tests: Recent Labs    03/03/24 1546  AST 36  ALT 22  ALKPHOS 98  BILITOT 1.6*  PROT 7.7  ALBUMIN  3.9   No results for input(s): LIPASE, AMYLASE in the last 72 hours. CBC: Recent Labs    03/03/24 1546 03/03/24 1551 03/04/24 1503  WBC 21.2*  --  19.3*  NEUTROABS 19.0*  --   --   HGB 13.5 15.0 11.0*  HCT 41.0 44.0 33.5*  MCV 95.8  --  96.8  PLT 361  --  294    Hemoglobin A1C: Recent Labs    03/03/24 1948  HGBA1C 5.3   Fasting Lipid Panel: Recent Labs    03/04/24 0241  CHOL 195  HDL 37*  LDLCALC 127*  TRIG 157*  CHOLHDL 5.3    Imaging: DG Knee 1-2 Views Right Result Date: 03/04/2024 CLINICAL DATA:  Right knee pain EXAM: RIGHT KNEE - 2 VIEW COMPARISON:  None Available. FINDINGS: There are no findings of fracture or dislocation. No joint effusion. 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 Vascular calcifications. Soft tissues are unremarkable.  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 Electronically Signed   By: Limin  Xu M.D.   On: 03/04/2024 17:35   MR BRAIN WO CONTRAST Result Date: 03/04/2024 EXAM: MRI BRAIN WITHOUT CONTRAST 03/04/2024 11:55:56 AM TECHNIQUE: Multiplanar multisequence MRI of the head/brain was performed without the administration of intravenous contrast. COMPARISON: CT head and CTA head and neck yesterday. CLINICAL HISTORY: 73 year old male. Code stroke presentation yesterday. FINDINGS: BRAIN AND VENTRICLES: Small cortically based infarct with restricted diffusion in the superior left occipital pole (series 3 image 31). Faint if any associated cytotoxic edema. No other diffusion restriction. Small chronic infarcts scattered in the right cerebellar hemisphere, in the brainstem (especially the left pons), and in the bilateral deep gray matter nuclei. Small area of chronic cortical encephalomalacia left anterior superior frontal gyrus (series 6 image 23). Patchy moderate additional bilateral white matter T2 and FLAIR hyperintensity, with asymmetric left corona radiata involvement. No chronic cerebral blood products on T2*. No acute intracranial hemorrhage. No mass effect. No midline shift. No hydrocephalus. The sella is unremarkable. Normal flow voids. ORBITS: Postoperative changes to both globes. SINUSES AND MASTOIDS: Trace  mastoid air cell effusions, appear inconsequential. BONES AND SOFT TISSUES: Normal marrow signal. Normal visible cervical spine. No acute soft tissue abnormality. IMPRESSION: 1. Very Small acute cortical infarct left superior occipital lobe. No intracranial hemorrhage or mass effect. 2. Chronic small and medium sized vessel ischemia, moderate to severe for age. Electronically signed by: Helayne Hurst MD 03/04/2024 01:44 PM EST RP Workstation: [id number]   ECHOCARDIOGRAM COMPLETE Result Date: 03/04/2024    ECHOCARDIOGRAM REPORT   Patient Name:    [name] [name] Date of Exam: 03/04/2024 Medical Rec #:  [id number]     Height:       66.0 in Accession #:    [id number]    Weight:       198.0 lb Date of Birth:  [birth year]     BSA:          1.991 m Patient Age:    73 years      BP:           163/99 mmHg Patient Gender: M             HR:           103 bpm. Exam Location:  Inpatient Procedure: 2D Echo (Both Spectral and Color Flow Doppler were utilized during            procedure). Indications:    stroke  History:        Patient has prior history of Echocardiogram examinations, most                 recent 08/09/2023. Cirrhosis; Risk Factors:Hypertension,                 Dyslipidemia and Sleep Apnea.  Sonographer:    Tinnie Barefoot RDCS Referring Phys: [id number] ASHISH ARORA  Sonographer Comments: Technically difficult study due to poor echo windows and suboptimal subcostal window. Image acquisition challenging due to respiratory motion. IMPRESSIONS  1. Left ventricular ejection fraction, by estimation, is >75%. The left ventricle has hyperdynamic function. The left ventricle has no regional wall motion abnormalities. There is mild left ventricular hypertrophy. Left ventricular diastolic parameters are consistent with Grade I diastolic dysfunction (impaired relaxation).  2. Right ventricular systolic function is normal. The right ventricular size is normal.  3. The mitral valve is normal in structure. No evidence of mitral valve regurgitation. No evidence of mitral stenosis. The mean mitral valve gradient is 3.7 mmHg. Moderate mitral annular calcification.  4. The aortic valve is normal in structure. Aortic valve regurgitation is not visualized. No aortic stenosis is present.  5. The inferior vena cava is normal in size with greater than 50% respiratory variability, suggesting right atrial pressure of 3 mmHg. Conclusion(s)/Recommendation(s): No intracardiac source of embolism detected on this transthoracic study. Consider a transesophageal echocardiogram to exclude  cardiac source of embolism if clinically indicated. FINDINGS  Left Ventricle: Left ventricular ejection fraction, by estimation, is >75%. The left ventricle has hyperdynamic function. The left ventricle has no regional wall motion abnormalities. Definity contrast agent was given IV to delineate the left ventricular endocardial borders. The left ventricular internal cavity size was normal in size. There is mild left ventricular hypertrophy. Left ventricular diastolic parameters are consistent with Grade I diastolic dysfunction (impaired relaxation). Right Ventricle: The right ventricular size is normal. No increase in right ventricular wall thickness. Right ventricular systolic function is normal. Left Atrium: Left atrial size was normal in size. Right Atrium: Right atrial size was normal in size. Pericardium: There is no evidence of  pericardial effusion. Mitral Valve: The mitral valve is normal in structure. Moderate mitral annular calcification. No evidence of mitral valve regurgitation. No evidence of mitral valve stenosis. MV peak gradient, 11.5 mmHg. The mean mitral valve gradient is 3.7 mmHg. Tricuspid Valve: The tricuspid valve is normal in structure. Tricuspid valve regurgitation is not demonstrated. No evidence of tricuspid stenosis. Aortic Valve: The aortic valve is normal in structure. Aortic valve regurgitation is not visualized. No aortic stenosis is present. Aortic valve mean gradient measures 13.7 mmHg. Aortic valve peak gradient measures 26.6 mmHg. Aortic valve area, by VTI measures 1.27 cm. Pulmonic Valve: The pulmonic valve was normal in structure. Pulmonic valve regurgitation is not visualized. No evidence of pulmonic stenosis. Aorta: The aortic root is normal in size and structure. Venous: The inferior vena cava is normal in size with greater than 50% respiratory variability, suggesting right atrial pressure of 3 mmHg. IAS/Shunts: No atrial level shunt detected by color flow Doppler.  LEFT  VENTRICLE PLAX 2D LVIDd:         3.80 cm   Diastology LVIDs:         2.10 cm   LV e' medial:    6.20 cm/s LV PW:         1.30 cm   LV E/e' medial:  8.3 LV IVS:        1.30 cm   LV e' lateral:   8.38 cm/s LVOT diam:     1.80 cm   LV E/e' lateral: 6.1 LV SV:         44 LV SV Index:   22 LVOT Area:     2.54 cm  RIGHT VENTRICLE RV Basal diam:  2.70 cm RV S prime:     23.00 cm/s LEFT ATRIUM           Index        RIGHT ATRIUM           Index LA diam:      3.60 cm 1.81 cm/m   RA Area:     13.60 cm LA Vol (A4C): 43.5 ml 21.85 ml/m  RA Volume:   30.00 ml  15.07 ml/m  AORTIC VALVE AV Area (Vmax):    1.21 cm AV Area (Vmean):   1.23 cm AV Area (VTI):     1.27 cm AV Vmax:           257.67 cm/s AV Vmean:          164.333 cm/s AV VTI:            0.342 m AV Peak Grad:      26.6 mmHg AV Mean Grad:      13.7 mmHg LVOT Vmax:         123.00 cm/s LVOT Vmean:        79.550 cm/s LVOT VTI:          0.171 m LVOT/AV VTI ratio: 0.50  AORTA Ao Root diam: 3.50 cm MITRAL VALVE MV Area VTI:  1.26 cm      SHUNTS MV Peak grad: 11.5 mmHg     Systemic VTI:  0.17 m MV Mean grad: 3.7 mmHg      Systemic Diam: 1.80 cm MV Vmax:      1.69 m/s MV Vmean:     87.2 cm/s MV E velocity: 51.40 cm/s MV A velocity: 146.00 cm/s MV E/A ratio:  0.35 Oneil Parchment MD Electronically signed by Oneil Parchment MD Signature Date/Time: 03/04/2024/12:54:41 PM    Final  CT CHEST ABDOMEN PELVIS W CONTRAST Result Date: 03/04/2024 EXAM: CT CHEST, ABDOMEN AND PELVIS WITH CONTRAST 03/04/2024 11:10:43 AM TECHNIQUE: CT of the chest, abdomen and pelvis was performed with the administration of intravenous contrast. Multiplanar reformatted images are provided for review. Automated exposure control, iterative reconstruction, and/or weight based adjustment of the mA/kV was utilized to reduce the radiation dose to as low as reasonably achievable. CONTRAST: 75mL of omnipaque  350. COMPARISON: CT Chest Abdomen Pelvis with contrast 10/21/2019. CLINICAL HISTORY: Polytrauma, blunt.  FINDINGS: CHEST: MEDIASTINUM AND LYMPH NODES: Heart and pericardium are unremarkable. Coronary artery calcifications are noted. The central airways are clear. No mediastinal, hilar or axillary lymphadenopathy. LUNGS AND PLEURA: No focal consolidation or pulmonary edema. No pleural effusion or pneumothorax. ABDOMEN AND PELVIS: LIVER: The liver is unremarkable. GALLBLADDER AND BILE DUCTS: Gallbladder is unremarkable. No biliary ductal dilatation. SPLEEN: No acute abnormality. PANCREAS: No acute abnormality. ADRENAL GLANDS: No acute abnormality. KIDNEYS, URETERS AND BLADDER: Possible bilateral nephrolithiasis is noted. No stones in the ureters. No hydronephrosis. No perinephric or periureteral stranding. Urinary bladder is unremarkable. GI AND BOWEL: Stomach demonstrates no acute abnormality. Diverticulosis of descending and sigmoid colon is noted without inflammation. There is no bowel obstruction. Status post appendectomy. REPRODUCTIVE ORGANS: No acute abnormality. PERITONEUM AND RETROPERITONEUM: No ascites. No free air. VASCULATURE: Aorta is normal in caliber. Aortic atherosclerosis. ABDOMINAL AND PELVIS LYMPH NODES: No lymphadenopathy. BONES AND SOFT TISSUES: Old bilateral rib fractures are noted. Old L1 compression fracture is noted. Moderately displaced and comminuted fracture of intertrochanteric region of proximal right femur is noted. No focal soft tissue abnormality. IMPRESSION: 1. Moderately displaced and comminuted intertrochanteric fracture of the proximal right femur, acute. 2. Old bilateral rib fractures, chronic. 3. Chronic L1 vertebral body compression fracture, chronic. 4. Status post appendectomy. 5. Aortic atherosclerosis. 6. Coronary artery calcifications. 7. Diverticulosis of the descending and sigmoid colon without evidence of inflammation. 8. Possible bilateral nephrolithiasis. Electronically signed by: Lynwood Seip MD 03/04/2024 11:45 AM EST RP Workstation: [id number]   DG Pelvis  Portable Result Date: 03/03/2024 EXAM: 1 or 2 VIEW(S) XRAY OF THE PELVIS 03/03/2024 05:20:00 PM COMPARISON: None available. CLINICAL HISTORY: fall fall FINDINGS: BONES AND JOINTS: Right femoral intertrochanteric fracture with displacement and varus angulation. No subluxation or dislocation. Mild symmetric degenerative changes in the hips. SOFT TISSUES: The soft tissues are unremarkable. IMPRESSION: 1. Right femoral intertrochanteric fracture with displacement and varus angulation. Electronically signed by: Franky Crease MD 03/03/2024 05:29 PM EDT RP Workstation: [id number]   DG Chest Portable 1 View Result Date: 03/03/2024 CLINICAL DATA:  Fall. EXAM: PORTABLE CHEST 1 VIEW COMPARISON:  06/30/2018. FINDINGS: The heart is enlarged and the mediastinal contour is within normal limits. Stable interstitial prominence is noted bilaterally. No consolidation, effusion, or pneumothorax is seen. There are fractures of the T2 through T4 ribs on the right. Old healed rib fractures are noted on the left. IMPRESSION: 1. Fractures of the T2 through T4 ribs on the right. 2. Cardiomegaly. Electronically Signed   By: Leita Birmingham M.D.   On: 03/03/2024 16:53   CT C-SPINE NO CHARGE Result Date: 03/03/2024 EXAM: CT CERVICAL SPINE WITH CONTRAST 03/03/2024 04:14:17 PM TECHNIQUE: CT of the cervical spine was performed with the administration of 75 mL of iohexol  (OMNIPAQUE ) 350 MG/ML injection. The cervical spine images were reconstructed from the concomitant CTA of the neck. Multiplanar reformatted images are provided for review. Automated exposure control, iterative reconstruction, and/or weight based adjustment of the mA/kV was utilized to reduce the radiation dose to as  low as reasonably achievable. COMPARISON: None available. CLINICAL HISTORY: FINDINGS: CERVICAL SPINE: BONES AND ALIGNMENT: No acute fracture or traumatic malalignment. DEGENERATIVE CHANGES: C5-C6 and C6-C7 disc space narrowing without spinal canal stenosis. Bulky  right anterior osteophytes at the C5-C7 levels. Upper cervical predominant facet arthrosis. SOFT TISSUES: No prevertebral soft tissue swelling. IMPRESSION: 1. No acute cervical spinal abnormality. Electronically signed by: Franky Stanford MD 03/03/2024 04:26 PM EDT RP Workstation: [id number]   CT ANGIO HEAD NECK W WO CM (CODE STROKE) Result Date: 03/03/2024 EXAM: CTA Head and Neck with Intravenous Contrast. CT Head without Contrast. CLINICAL HISTORY: Neuro deficit, acute, stroke suspected. TECHNIQUE: Axial CTA images of the head and neck performed with intravenous contrast. MIP reconstructed images were created and reviewed. Axial computed tomography images of the head/brain performed without intravenous contrast. Note: Per PQRS, the description of internal carotid artery percent stenosis, including 0 percent or normal exam, is based on [place] [employer] Symptomatic Carotid Endarterectomy Trial (NASCET) criteria. Dose reduction technique was used including one or more of the following: automated exposure control, adjustment of mA and kV according to patient size, and/or iterative reconstruction. CONTRAST: Without and with; 75 mL iohexol  (OMNIPAQUE ) 350 MG/ML injection. COMPARISON: None provided. FINDINGS: CT HEAD: BRAIN: No acute intraparenchymal hemorrhage. No mass lesion. No CT evidence for acute territorial infarct. No midline shift or extra-axial collection. Intracranial images are mildly degraded by motion. VENTRICLES: No hydrocephalus. ORBITS: The orbits are unremarkable. SINUSES AND MASTOIDS: The paranasal sinuses and mastoid air cells are clear. CTA NECK: COMMON CAROTID ARTERIES: Mixed density atherosclerotic disease at both carotid bifurcations without hemodynamically significant stenosis. No dissection or occlusion. INTERNAL CAROTID ARTERIES: Mixed density atherosclerotic disease at both carotid bifurcations without hemodynamically significant stenosis. No dissection or occlusion. VERTEBRAL ARTERIES:  Codominant vertebral arteries. Mild narrowing of the right vertebral artery origin and minimal atherosclerotic calcification of both V4 segments. Vertebral arteries otherwise normal. No dissection or occlusion. CTA HEAD: ANTERIOR CEREBRAL ARTERIES: No significant stenosis. No occlusion. No aneurysm. MIDDLE CEREBRAL ARTERIES: No significant stenosis. No occlusion. No aneurysm. POSTERIOR CEREBRAL ARTERIES: No significant stenosis. No occlusion. No aneurysm. BASILAR ARTERY: No significant stenosis. No occlusion. No aneurysm. OTHER: Atherosclerotic calcification of the cavernous segments of both internal carotid arteries without high grade stenosis. SOFT TISSUES: No acute finding. No masses or lymphadenopathy. BONES: Calcific aortic atherosclerosis. No acute osseous abnormality. IMPRESSION: 1. No emergent large vessel occlusion. 2. Atherosclerotic disease at both carotid bifurcations without hemodynamically significant stenosis. Electronically signed by: Franky Stanford MD 03/03/2024 04:24 PM EDT RP Workstation: [id number]   CT HEAD CODE STROKE WO CONTRAST Result Date: 03/03/2024 EXAM: CT HEAD WITHOUT CONTRAST 03/03/2024 03:55:48 PM TECHNIQUE: CT of the head was performed without the administration of intravenous contrast. Automated exposure control, iterative reconstruction, and/or weight based adjustment of the mA/kV was utilized to reduce the radiation dose to as low as reasonably achievable. COMPARISON: 10/02/2019 CLINICAL HISTORY: Neuro deficit, acute, stroke suspected. FINDINGS: BRAIN AND VENTRICLES: No acute hemorrhage. No evidence of acute infarct. No hydrocephalus. No extra-axial collection. No mass effect or midline shift. Chronic ischemic white matter changes. Old left frontal infarct. [place] Stroke Program Early CT Score (ASPECTS) ----- Ganglionic (caudate, ic, lentiform nucleus, insula, M1-m3): 7 Supraganglionic (m4-m6): 3 Total: 10 ORBITS: No acute abnormality. SINUSES: No acute abnormality. SOFT TISSUES  AND SKULL: No acute soft tissue abnormality. No skull fracture. IMPRESSION: 1. No acute intracranial abnormality. 2. Old left frontal infarct and chronic ischemic white matter changes. 3. ASPECTS: 10. Findings communicated to Dr. Ashish Arora at  4:06 pm on 03/03/24 Electronically signed by: Franky Stanford MD 03/03/2024 04:11 PM EDT RP Workstation: [id number]    Cardiac Studies:  ECG: ST vs atrial tachycardia    Telemetry:  SR with frequent PAC;s   Echo: EF > 75% read as no AS but wrong mean gradient 3.7 peak 26.6 AVA 1.2 cm2 moderate AS   Medications:    aspirin   81 mg Oral Daily   Chlorhexidine  Gluconate Cloth  6 each Topical Daily   ezetimibe   10 mg Oral Daily   pantoprazole  (PROTONIX ) IV  40 mg Intravenous QHS   QUEtiapine   25 mg Oral QHS      sodium chloride  75 mL/hr at 03/05/24 0600    ceFAZolin  (ANCEF ) IV     clevidipine Stopped (03/03/24 2151)   tranexamic acid      Assessment/Plan:   Arrhythmia. Has a long list of allergies with no specific descriptions. Dr Creta notes indicate non compliance. Will try Zebeta for rate control 10 mg. He has not been tried on this before. ( Lopressor /Atenolol ) More atrial tachycardia than flutter not a good long term candidate for anticoagulation with ETOH abuse add low dose amiodarone to stabilize rhythm preoperatively Femur fracture:  ok for surgery from cardiac standpoint ? Tomorrow late in day AS moderate stable EF > 75% outpatient f/u [facility] 03/05/2024, 8:39 AM

## 2024-03-05 NOTE — TOC CAGE-AID Note (Signed)
 Transition of Care [facility]) - CAGE-AID Screening   Patient Details  Name: [name] [name] MRN: [id number] Date of Birth: 17-Sep-1950  [facility]) CM/SW Contact:    Inocente GORMAN Kindle, LCSW Phone Number: 03/05/2024, 9:56 AM   Clinical Narrative: Patient is disoriented and unable to participate in screening at this time.    CAGE-AID Screening: Substance Abuse Screening unable to be completed due to: : Patient unable to participate

## 2024-03-05 NOTE — Plan of Care (Signed)
  Problem: Education: Goal: Knowledge of disease or condition will improve Outcome: Progressing   Problem: Ischemic Stroke/TIA Tissue Perfusion: Goal: Complications of ischemic stroke/TIA will be minimized Outcome: Progressing   Problem: Coping: Goal: Will identify appropriate support needs Outcome: Progressing

## 2024-03-05 NOTE — Progress Notes (Signed)
 STROKE TEAM PROGRESS NOTE    SIGNIFICANT HOSPITAL EVENTS 11/1 presented with confusion, aphasia and right side weakness. Received IV TNK @ 1630  INTERIM HISTORY/SUBJECTIVE Patient is neurologically stable.  Remains disoriented and confused but is able to move all 4 extremities well against gravity except for right leg which he movement is limited due to hip fracture.  He is able to answer some questions, follows commands.   If remains stable can likely transfer out of the ICU this afternoon  CBC    Component Value Date/Time   WBC 19.3 (H) 03/04/2024 1503   RBC 3.46 (L) 03/04/2024 1503   HGB 11.0 (L) 03/04/2024 1503   HCT 33.5 (L) 03/04/2024 1503   PLT 294 03/04/2024 1503   MCV 96.8 03/04/2024 1503   MCH 31.8 03/04/2024 1503   MCHC 32.8 03/04/2024 1503   RDW 14.0 03/04/2024 1503   LYMPHSABS 1.0 03/03/2024 1546   MONOABS 1.1 (H) 03/03/2024 1546   EOSABS 0.0 03/03/2024 1546   BASOSABS 0.1 03/03/2024 1546    BMET    Component Value Date/Time   NA 135 03/04/2024 1503   NA 137 10/07/2023 1311   K 3.3 (L) 03/04/2024 1503   CL 104 03/04/2024 1503   CO2 15 (L) 03/04/2024 1503   GLUCOSE 153 (H) 03/04/2024 1503   BUN 32 (H) 03/04/2024 1503   BUN 22 10/07/2023 1311   CREATININE 1.92 (H) 03/04/2024 1503   CALCIUM 8.4 (L) 03/04/2024 1503   EGFR 30.0 01/20/2024 0936   EGFR 69 10/07/2023 1311   GFRNONAA 36 (L) 03/04/2024 1503    IMAGING past 24 hours DG Knee 1-2 Views Right Result Date: 03/04/2024 CLINICAL DATA:  Right knee pain EXAM: RIGHT KNEE - 2 VIEW COMPARISON:  None Available. FINDINGS: There are no findings of fracture or dislocation. No joint effusion. 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 Vascular calcifications. Soft tissues are unremarkable. 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 Electronically Signed   By: Limin  Xu M.D.   On: 03/04/2024  17:35    Vitals:   03/05/24 0900 03/05/24 1100 03/05/24 1200 03/05/24 1300  BP: (!) 153/74 137/65 (!) 138/57 (!) 137/56  Pulse: (!) 103  63 63  Resp: (!) 22 (!) 21 (!) 25 20  Temp:   99.6 F (37.6 C)   TempSrc:   Axillary   SpO2: 99%  95% 95%  Weight: 89.8 kg     Height: 5' 6 (1.676 m)        PHYSICAL EXAM General:  Alert, well-nourished, well-developed patient in no acute distress Psych:  Mood and affect appropriate for situation CV: Regular rate and rhythm on monitor Respiratory:  Regular, unlabored respirations on room air GI: Abdomen soft and nontender   NEURO:  Mental Status: Awake and alert, confused, able to follow commands.  Very hard of hearing.  Some mild aphasia  Cranial Nerves:  II: PERRL. Visual fields full.  III, IV, VI: EOMI. Eyelids elevate symmetrically.  V: Sensation is intact to light touch and symmetrical to face.  VII: Face is symmetrical resting and smiling VIII: Very hard of hearing IX, X: Palate elevates symmetrically. Phonation is normal.  KP:Dynloizm shrug 5/5. XII: tongue is midline without fasciculations. Motor: Moves bilateral uppers and left leg spontaneous and antigravity, right leg minimal movement due to right femur fracture can wiggle toes Tone: is normal and bulk is normal Sensation- Intact to light touch bilaterally. Extinction  absent to light touch to DSS.   Coordination: FTN intact bilaterally, HKS: no ataxia in BLE.No drift.  Gait- deferred  Most Recent NIH 8   ASSESSMENT/PLAN  [name] [name] is a 73 y.o. 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  Per EMS will check was called and EMS found him on the floor with a laceration on his head, noted to be confused agitated, with left gaze preference and hypertensive at 240/110. NIH on  Admission 18  Strokelike episode: Presenting with confusion aphasia and left gaze deviation.  s/p TNK with fall and right hip femur fracture.  MRI shows small left occipital infarct which does not explain his presenting symptoms Etiology: Likely cardioembolic Code Stroke  CT head No acute abnormality. ASPECTS 10.  Old left frontal infarct and chronic ischemic white matter changes  CTA head & neck No LVO Atherosclerotic disease at both carotid bifurcations without hemodynamically significant stenosis  MRI small left superior occipital cortical infarct 2D Echo EF > 75%.  Mild LVH with grade 1 diastolic dysfunction LDL 127 HgbA1c 5.3 VTE prophylaxis -SCDs No antithrombotic prior to admission, now on aspirin  alone because of impending hip fracture surgery Therapy recommendations:  Pending Disposition: Pending  Hx of Stroke/TIA Old left frontal infarct on brain imaging  S/p unwitnessed fall Acute right femur fracture Pelvis x-ray Right femoral intertrochanteric fracture with displacement and varus angulation. CT chest abdomen pelvis:  1. Moderately displaced and comminuted intertrochanteric fracture of the proximal right femur, acute. 2. Old bilateral rib fractures, chronic. 3. Chronic L1 vertebral body compression fracture, chronic. 4. Status post appendectomy. 5. Aortic atherosclerosis. 6. Coronary artery calcifications. 7. Diverticulosis of the descending and sigmoid colon without evidence of inflammation. 8. Possible bilateral nephrolithiasis. Ortho consulted   Hypertension CHF Home meds: Amlodipine  5 mg, propranolol  20 mg Stable Home meds on hold currently Blood Pressure Goal: BP less than 180/105   Hyperlipidemia Home meds: Zetia  10 mg,  resumed in hospital LDL 127, goal < 70 Continue statin at discharge  Substance Abuse History of EtOH abuse History of liver cirrhosis, esophageal varices UDS positive ordered ETOH use, alcohol level <15, advised to drink no more than  2 drink(s) a day TOC consult for cessation placed  Dysphagia Patient has post-stroke dysphagia, SLP consulted    Diet   DIET - DYS 1 Room service appropriate? No; Fluid consistency: Thin   Diet NPO time specified   Advance diet as tolerated  Other Stroke Risk Factors Obesity, Body mass index is 31.95 kg/m., BMI >/= 30 associated with increased stroke risk, recommend weight loss, diet and exercise as appropriate  Coronary artery disease Congestive heart failure Obstructive sleep apnea  Other Active Problems  Hospital day # 2    Patient presented with confusion and gaze deviation and right-sided weakness due to suspected left brain infarct and received IV TNK and has shown significant improvement but unfortunately had a fall and has right femur fracture which will need surgery tomorrow evening as per orthopedics.  Continue  Pain medication for humeral fracture.  Continue physical Occupational Therapy.  Transfer out of ICU to floor bed today.  Discussed with cardiology patient does not have definite A-fib hence we will discontinue IV heparin  and start aspirin  alone.  May need 30-day heart monitor at discharge to look for paroxysmal  A-fib.  No family available at the bedside for discussion.   I personally spent a total of 50 minutes in the care of the patient today including getting/reviewing separately obtained history, performing a medically appropriate exam/evaluation, counseling and educating, placing orders, referring and communicating with other health care professionals, documenting clinical information in the EHR, independently interpreting results, and coordinating care.            Eather Popp, MD Medical Director [facility] Pager: [phone] 03/05/2024 3:07 PM  To contact Stroke Continuity provider, please refer to [email or web address]. After hours, contact General Neurology

## 2024-03-06 ENCOUNTER — Encounter (HOSPITAL_COMMUNITY)
Admission: EM | Disposition: A | Discharge status: Skilled Nursing Facility | Payer: Self-pay | Source: Home / Self Care | Attending: Neurology

## 2024-03-06 ENCOUNTER — Inpatient Hospital Stay (HOSPITAL_COMMUNITY)

## 2024-03-06 ENCOUNTER — Encounter (HOSPITAL_COMMUNITY): Payer: Self-pay | Admitting: Anesthesiology

## 2024-03-06 ENCOUNTER — Encounter (HOSPITAL_COMMUNITY): Payer: Self-pay | Admitting: Student in an Organized Health Care Education/Training Program

## 2024-03-06 ENCOUNTER — Inpatient Hospital Stay (HOSPITAL_COMMUNITY): Payer: Self-pay | Admitting: Anesthesiology

## 2024-03-06 DIAGNOSIS — S72001A Fracture of unspecified part of neck of right femur, initial encounter for closed fracture: Secondary | ICD-10-CM

## 2024-03-06 DIAGNOSIS — I5020 Unspecified systolic (congestive) heart failure: Secondary | ICD-10-CM

## 2024-03-06 DIAGNOSIS — I110 Hypertensive heart disease with heart failure: Secondary | ICD-10-CM

## 2024-03-06 DIAGNOSIS — I350 Nonrheumatic aortic (valve) stenosis: Secondary | ICD-10-CM | POA: Diagnosis not present

## 2024-03-06 DIAGNOSIS — Z01810 Encounter for preprocedural cardiovascular examination: Secondary | ICD-10-CM | POA: Diagnosis not present

## 2024-03-06 DIAGNOSIS — S72141A Displaced intertrochanteric fracture of right femur, initial encounter for closed fracture: Secondary | ICD-10-CM | POA: Diagnosis not present

## 2024-03-06 HISTORY — PX: INTRAMEDULLARY (IM) NAIL INTERTROCHANTERIC: SHX5875

## 2024-03-06 SURGERY — FIXATION, FRACTURE, INTERTROCHANTERIC, WITH INTRAMEDULLARY ROD
Anesthesia: General | Laterality: Right

## 2024-03-06 MED ORDER — PROPOFOL 10 MG/ML IV BOLUS
INTRAVENOUS | Status: AC
  Filled 2024-03-06: qty 20

## 2024-03-06 MED ORDER — ONDANSETRON HCL 4 MG/2ML IJ SOLN
INTRAMUSCULAR | Status: DC | PRN
  Administered 2024-03-06: 4 mg via INTRAVENOUS

## 2024-03-06 MED ORDER — OXYCODONE HCL 5 MG PO TABS
5.0000 mg | ORAL_TABLET | Freq: Once | ORAL | Status: AC | PRN
  Administered 2024-03-06: 5 mg via ORAL

## 2024-03-06 MED ORDER — ROCURONIUM BROMIDE 10 MG/ML (PF) SYRINGE
PREFILLED_SYRINGE | INTRAVENOUS | Status: AC
  Filled 2024-03-06: qty 30

## 2024-03-06 MED ORDER — PROPOFOL 10 MG/ML IV BOLUS
INTRAVENOUS | Status: DC | PRN
  Administered 2024-03-06: 100 mg via INTRAVENOUS

## 2024-03-06 MED ORDER — PHENYLEPHRINE 80 MCG/ML (10ML) SYRINGE FOR IV PUSH (FOR BLOOD PRESSURE SUPPORT)
PREFILLED_SYRINGE | INTRAVENOUS | Status: DC | PRN
  Administered 2024-03-06: 80 ug via INTRAVENOUS
  Administered 2024-03-06: 160 ug via INTRAVENOUS

## 2024-03-06 MED ORDER — LACTATED RINGERS IV SOLN: INTRAVENOUS | Status: DC

## 2024-03-06 MED ORDER — CEFAZOLIN SODIUM-DEXTROSE 2-3 GM-%(50ML) IV SOLR
INTRAVENOUS | Status: DC | PRN
  Administered 2024-03-06: 2 g via INTRAVENOUS

## 2024-03-06 MED ORDER — VANCOMYCIN HCL 1000 MG IV SOLR
INTRAVENOUS | Status: DC | PRN
  Administered 2024-03-06: 1000 mg

## 2024-03-06 MED ORDER — LIDOCAINE 2% (20 MG/ML) 5 ML SYRINGE
INTRAMUSCULAR | Status: AC
Start: 2024-03-06 — End: 2024-03-06
  Filled 2024-03-06: qty 10

## 2024-03-06 MED ORDER — VANCOMYCIN HCL 1000 MG IV SOLR
INTRAVENOUS | Status: AC
  Filled 2024-03-06: qty 20

## 2024-03-06 MED ORDER — EPHEDRINE 5 MG/ML INJ
INTRAVENOUS | Status: AC
Start: 2024-03-06 — End: 2024-03-06
  Filled 2024-03-06: qty 10

## 2024-03-06 MED ORDER — ROCURONIUM BROMIDE 10 MG/ML (PF) SYRINGE
PREFILLED_SYRINGE | INTRAVENOUS | Status: DC | PRN
  Administered 2024-03-06: 10 mg via INTRAVENOUS
  Administered 2024-03-06: 70 mg via INTRAVENOUS

## 2024-03-06 MED ORDER — LIDOCAINE 2% (20 MG/ML) 5 ML SYRINGE
INTRAMUSCULAR | Status: DC | PRN
  Administered 2024-03-06: 100 mg via INTRAVENOUS

## 2024-03-06 MED ORDER — FENTANYL CITRATE (PF) 100 MCG/2ML IJ SOLN
INTRAMUSCULAR | Status: AC
  Filled 2024-03-06: qty 2

## 2024-03-06 MED ORDER — HYDROMORPHONE HCL 1 MG/ML IJ SOLN
INTRAMUSCULAR | Status: AC
  Filled 2024-03-06: qty 0.5

## 2024-03-06 MED ORDER — OXYCODONE HCL 5 MG/5ML PO SOLN: 5.0000 mg | Freq: Once | ORAL | Status: AC | PRN

## 2024-03-06 MED ORDER — OXYCODONE HCL 5 MG PO TABS
ORAL_TABLET | ORAL | Status: AC
  Filled 2024-03-06: qty 1

## 2024-03-06 MED ORDER — CEFAZOLIN SODIUM-DEXTROSE 2-4 GM/100ML-% IV SOLN
INTRAVENOUS | Status: AC
  Filled 2024-03-06: qty 100

## 2024-03-06 MED ORDER — FENTANYL CITRATE (PF) 250 MCG/5ML IJ SOLN
INTRAMUSCULAR | Status: DC | PRN
  Administered 2024-03-06 (×2): 50 ug via INTRAVENOUS

## 2024-03-06 MED ORDER — DEXAMETHASONE SOD PHOSPHATE PF 10 MG/ML IJ SOLN
INTRAMUSCULAR | Status: DC | PRN
  Administered 2024-03-06: 4 mg via INTRAVENOUS

## 2024-03-06 MED ORDER — ESMOLOL HCL 100 MG/10ML IV SOLN
INTRAVENOUS | Status: AC
  Filled 2024-03-06: qty 10

## 2024-03-06 MED ORDER — ROCURONIUM BROMIDE 10 MG/ML (PF) SYRINGE
PREFILLED_SYRINGE | INTRAVENOUS | Status: AC
  Filled 2024-03-06: qty 10

## 2024-03-06 MED ORDER — DEXMEDETOMIDINE HCL IN NACL 80 MCG/20ML IV SOLN
INTRAVENOUS | Status: AC
Start: 2024-03-06 — End: 2024-03-06
  Filled 2024-03-06: qty 20

## 2024-03-06 MED ORDER — PHENYLEPHRINE HCL-NACL 20-0.9 MG/250ML-% IV SOLN
INTRAVENOUS | Status: DC | PRN
  Administered 2024-03-06: 25 ug/min via INTRAVENOUS

## 2024-03-06 MED ORDER — SUGAMMADEX SODIUM 200 MG/2ML IV SOLN
INTRAVENOUS | Status: DC | PRN
  Administered 2024-03-06: 200 mg via INTRAVENOUS

## 2024-03-06 MED ORDER — CHLORHEXIDINE GLUCONATE 0.12 % MT SOLN
OROMUCOSAL | Status: AC
  Administered 2024-03-06: 15 mL via OROMUCOSAL
  Filled 2024-03-06: qty 15

## 2024-03-06 MED ORDER — FENTANYL CITRATE (PF) 100 MCG/2ML IJ SOLN
25.0000 ug | INTRAMUSCULAR | Status: DC | PRN
  Administered 2024-03-06 (×2): 25 ug via INTRAVENOUS
  Administered 2024-03-06: 50 ug via INTRAVENOUS

## 2024-03-06 MED ORDER — ARTIFICIAL TEARS OPHTHALMIC OINT
TOPICAL_OINTMENT | OPHTHALMIC | Status: AC
  Filled 2024-03-06: qty 3.5

## 2024-03-06 MED ORDER — HYDRALAZINE HCL 20 MG/ML IJ SOLN
5.0000 mg | Freq: Once | INTRAMUSCULAR | Status: AC
  Administered 2024-03-06: 5 mg via INTRAVENOUS

## 2024-03-06 MED ORDER — TRANEXAMIC ACID-NACL 1000-0.7 MG/100ML-% IV SOLN
INTRAVENOUS | Status: DC | PRN
  Administered 2024-03-06: 1000 mg via INTRAVENOUS

## 2024-03-06 MED ORDER — PHENYLEPHRINE 80 MCG/ML (10ML) SYRINGE FOR IV PUSH (FOR BLOOD PRESSURE SUPPORT)
PREFILLED_SYRINGE | INTRAVENOUS | Status: AC
  Filled 2024-03-06: qty 10

## 2024-03-06 MED ORDER — ONDANSETRON HCL 4 MG/2ML IJ SOLN
INTRAMUSCULAR | Status: AC
Start: 2024-03-06 — End: 2024-03-06
  Filled 2024-03-06: qty 4

## 2024-03-06 MED ORDER — RIVAROXABAN 10 MG PO TABS
10.0000 mg | ORAL_TABLET | Freq: Every day | ORAL | Status: DC
  Administered 2024-03-07 – 2024-03-11 (×5): 10 mg via ORAL
  Filled 2024-03-06 (×5): qty 1

## 2024-03-06 MED ORDER — EPHEDRINE SULFATE-NACL 50-0.9 MG/10ML-% IV SOSY
PREFILLED_SYRINGE | INTRAVENOUS | Status: DC | PRN
  Administered 2024-03-06: 5 mg via INTRAVENOUS
  Administered 2024-03-06: 10 mg via INTRAVENOUS

## 2024-03-06 MED ORDER — ONDANSETRON HCL 4 MG/2ML IJ SOLN
INTRAMUSCULAR | Status: AC
  Filled 2024-03-06: qty 2

## 2024-03-06 MED ORDER — TRANEXAMIC ACID-NACL 1000-0.7 MG/100ML-% IV SOLN
1000.0000 mg | Freq: Once | INTRAVENOUS | Status: AC
  Administered 2024-03-06: 1000 mg via INTRAVENOUS
  Filled 2024-03-06: qty 100

## 2024-03-06 MED ORDER — HYDROMORPHONE HCL 1 MG/ML IJ SOLN
INTRAMUSCULAR | Status: DC | PRN
  Administered 2024-03-06: .5 mg via INTRAVENOUS

## 2024-03-06 MED ORDER — GLYCOPYRROLATE PF 0.2 MG/ML IJ SOSY
PREFILLED_SYRINGE | INTRAMUSCULAR | Status: AC
Start: 2024-03-06 — End: 2024-03-06
  Filled 2024-03-06: qty 1

## 2024-03-06 MED ORDER — LIDOCAINE 2% (20 MG/ML) 5 ML SYRINGE
INTRAMUSCULAR | Status: AC
  Filled 2024-03-06: qty 5

## 2024-03-06 MED ORDER — PHENYLEPHRINE 80 MCG/ML (10ML) SYRINGE FOR IV PUSH (FOR BLOOD PRESSURE SUPPORT)
PREFILLED_SYRINGE | INTRAVENOUS | Status: AC
Start: 2024-03-06 — End: 2024-03-06
  Filled 2024-03-06: qty 30

## 2024-03-06 MED ORDER — EPHEDRINE 5 MG/ML INJ
INTRAVENOUS | Status: AC
  Filled 2024-03-06: qty 5

## 2024-03-06 MED ORDER — 0.9 % SODIUM CHLORIDE (POUR BTL) OPTIME
TOPICAL | Status: DC | PRN
  Administered 2024-03-06: 1000 mL

## 2024-03-06 MED ORDER — TRANEXAMIC ACID-NACL 1000-0.7 MG/100ML-% IV SOLN
INTRAVENOUS | Status: AC
  Filled 2024-03-06: qty 100

## 2024-03-06 MED ORDER — HYDRALAZINE HCL 20 MG/ML IJ SOLN
INTRAMUSCULAR | Status: AC
  Filled 2024-03-06: qty 1

## 2024-03-06 MED ORDER — AMISULPRIDE (ANTIEMETIC) 5 MG/2ML IV SOLN: 10.0000 mg | Freq: Once | INTRAVENOUS | Status: DC | PRN

## 2024-03-06 MED ORDER — ORAL CARE MOUTH RINSE: 15.0000 mL | Freq: Once | OROMUCOSAL | Status: AC

## 2024-03-06 MED ORDER — CHLORHEXIDINE GLUCONATE 0.12 % MT SOLN: 15.0000 mL | Freq: Once | OROMUCOSAL | Status: AC

## 2024-03-06 MED ORDER — CEFAZOLIN SODIUM-DEXTROSE 2-4 GM/100ML-% IV SOLN
2.0000 g | Freq: Four times a day (QID) | INTRAVENOUS | Status: AC
  Administered 2024-03-07 (×3): 2 g via INTRAVENOUS
  Filled 2024-03-06 (×3): qty 100

## 2024-03-06 SURGICAL SUPPLY — 31 items
BIT DRILL INTERTAN LAG SCREW (BIT) IMPLANT
BIT DRILL SHORT 4.0 (BIT) IMPLANT
BNDG COHESIVE 6X5 TAN NS LF (GAUZE/BANDAGES/DRESSINGS) ×1 IMPLANT
COVER PERINEAL POST (MISCELLANEOUS) ×1 IMPLANT
COVER SURGICAL LIGHT HANDLE (MISCELLANEOUS) ×1 IMPLANT
DRAPE C-ARM 42X72 X-RAY (DRAPES) ×1 IMPLANT
DRAPE C-ARMOR (DRAPES) ×1 IMPLANT
DRAPE STERI IOBAN 125X83 (DRAPES) ×1 IMPLANT
DRSG TEGADERM 4X4.75 (GAUZE/BANDAGES/DRESSINGS) ×5 IMPLANT
DURAPREP 26ML APPLICATOR (WOUND CARE) ×1 IMPLANT
GAUZE SPONGE 4X4 12PLY STRL (GAUZE/BANDAGES/DRESSINGS) ×1 IMPLANT
GAUZE XEROFORM 1X8 LF (GAUZE/BANDAGES/DRESSINGS) ×1 IMPLANT
GAUZE XEROFORM 5X9 LF (GAUZE/BANDAGES/DRESSINGS) IMPLANT
GLOVE INDICATOR 7.5 STRL GRN (GLOVE) ×1 IMPLANT
GLOVE SS BIOGEL STRL SZ 7.5 (GLOVE) ×1 IMPLANT
GOWN STRL SURGICAL XL XLNG (GOWN DISPOSABLE) ×1 IMPLANT
KIT BASIN OR (CUSTOM PROCEDURE TRAY) ×1 IMPLANT
KIT TURNOVER KIT B (KITS) ×1 IMPLANT
NAIL TRIGEN 10MMX36CM-125 RT (Nail) IMPLANT
PACK GENERAL/GYN (CUSTOM PROCEDURE TRAY) ×1 IMPLANT
PAD ARMBOARD POSITIONER FOAM (MISCELLANEOUS) ×1 IMPLANT
PIN GUIDE 3.2X343MM (PIN) IMPLANT
ROD GUIDE 3.0 (MISCELLANEOUS) IMPLANT
SCREW LAG COMPR KIT 100/95 (Screw) IMPLANT
SCREW TRIGEN LOW PROF 5.0X32.5 (Screw) IMPLANT
SCREW TRIGEN LOW PROF 5.0X37.5 (Screw) IMPLANT
SOLN 0.9% NACL POUR BTL 1000ML (IV SOLUTION) ×1 IMPLANT
SOLN STERILE WATER BTL 1000 ML (IV SOLUTION) ×1 IMPLANT
STAPLER VISISTAT (STAPLE) ×1 IMPLANT
SUT VIC AB 0 CT1 18XCR BRD8 (SUTURE) ×1 IMPLANT
SUT VIC AB 2-0 CT1 18 (SUTURE) ×1 IMPLANT

## 2024-03-06 NOTE — Anesthesia Preprocedure Evaluation (Addendum)
 Anesthesia Evaluation  Patient identified by MRN, date of birth, ID band Patient confused    Reviewed: Allergy & Precautions, NPO status , Patient's Chart, lab work & pertinent test results  History of Anesthesia Complications Negative for: history of anesthetic complications  Airway Mallampati: III  TM Distance: >3 FB Neck ROM: Full    Dental  (+) Dental Advisory Given   Pulmonary sleep apnea    Pulmonary exam normal breath sounds clear to auscultation       Cardiovascular hypertension (amlodipine , propranalol), Pt. on medications and Pt. on home beta blockers (-) angina +CHF  (-) Past MI, (-) Cardiac Stents and (-) CABG + dysrhythmias (prolonged QT) + Valvular Problems/Murmurs AS  Rhythm:Regular Rate:Normal + Systolic murmurs HLD  TTE 03/04/2024: IMPRESSIONS    1. Left ventricular ejection fraction, by estimation, is >75%. The left  ventricle has hyperdynamic function. The left ventricle has no regional  wall motion abnormalities. There is mild left ventricular hypertrophy.  Left ventricular diastolic parameters  are consistent with Grade I diastolic dysfunction (impaired relaxation).   2. Right ventricular systolic function is normal. The right ventricular  size is normal.   3. The mitral valve is normal in structure. No evidence of mitral valve  regurgitation. No evidence of mitral stenosis. The mean mitral valve  gradient is 3.7 mmHg. Moderate mitral annular calcification.   4. The aortic valve is normal in structure. Aortic valve regurgitation is  not visualized. No aortic stenosis is present.   5. The inferior vena cava is normal in size with greater than 50%  respiratory variability, suggesting right atrial pressure of 3 mmHg.     Neuro/Psych  PSYCHIATRIC DISORDERS      CVA negative neurological ROS     GI/Hepatic ,GERD  ,,(+) Cirrhosis  (alcoholic)  Esophageal Varices  substance abuse  alcohol use and  marijuana use  Endo/Other  negative endocrine ROS    Renal/GU negative Renal ROS     Musculoskeletal  (+) Arthritis ,    Abdominal  (+) + obese  Peds  Hematology  (+) Blood dyscrasia, anemia Lab Results      Component                Value               Date                      WBC                      19.3 (H)            03/04/2024                HGB                      11.0 (L)            03/04/2024                HCT                      33.5 (L)            03/04/2024                MCV                      96.8  03/04/2024                PLT                      294                 03/04/2024              Anesthesia Other Findings Left vocal cord paralysis  11/1 presented with confusion, aphasia and right side weakness. Received IV TNK @ 1630  Reproductive/Obstetrics                              Anesthesia Physical Anesthesia Plan  ASA: 4  Anesthesia Plan: General   Post-op Pain Management: Tylenol  PO (pre-op)*   Induction: Intravenous  PONV Risk Score and Plan: 2 and Ondansetron , Dexamethasone  and Treatment may vary due to age or medical condition  Airway Management Planned: Oral ETT  Additional Equipment:   Intra-op Plan:   Post-operative Plan: Extubation in OR  Informed Consent: I have reviewed the patients History and Physical, chart, labs and discussed the procedure including the risks, benefits and alternatives for the proposed anesthesia with the patient or authorized representative who has indicated his/her understanding and acceptance.     Dental advisory given and Consent reviewed with POA  Plan Discussed with: CRNA and Anesthesiologist  Anesthesia Plan Comments: (Phone consent obtained from the patient's brother, Ranny Wiebelhaus, at [phone]. Risks of general anesthesia discussed including, but not limited to, sore throat, hoarse voice, chipped/damaged teeth, injury to vocal cords, nausea and vomiting,  allergic reactions, lung infection, heart attack, stroke, and death. All questions answered. )         Anesthesia Quick Evaluation

## 2024-03-06 NOTE — Care Management Important Message (Signed)
 Important Message  Patient Details  Name: KEY CEN MRN: [id number] Date of Birth: Oct 05, 1950   Important Message Given:  Yes - Medicare IM     Claretta Deed 03/06/2024, 2:57 PM

## 2024-03-06 NOTE — Progress Notes (Signed)
 Cardiologist:  Santo  Subjective:  Denies SSCP, palpitations or Dyspnea More alert   Objective:  Vitals:   03/05/24 2023 03/06/24 0036 03/06/24 0446 03/06/24 0708  BP: (!) 152/57 (!) 154/54 (!) 145/55 (!) 153/62  Pulse:  (!) 54 (!) 53 (!) 54  Resp:   18 19  Temp:  97.8 F (36.6 C) 98 F (36.7 C) 98.2 F (36.8 C)  TempSrc:  Oral Oral Oral  SpO2:  99% 99% 100%  Weight:      Height:        Intake/Output from previous day:  Intake/Output Summary (Last 24 hours) at 03/06/2024 0919 Last data filed at 03/06/2024 0724 Gross per 24 hour  Intake 587.06 ml  Output 850 ml  Net -262.94 ml    Physical Exam:  Alert cracking jokes AS murmur Lungs clear Abdomen benign Right femur fracture Plus one edema  Lab Results: Basic Metabolic Panel: Recent Labs    03/03/24 1546 03/03/24 1551 03/04/24 1503  NA 132* 133* 135  K 4.0 4.0 3.3*  CL 96* 100 104  CO2 18*  --  15*  GLUCOSE 218* 212* 153*  BUN 14 17 32*  CREATININE 1.13 1.10 1.92*  CALCIUM 9.0  --  8.4*   Liver Function Tests: Recent Labs    03/03/24 1546  AST 36  ALT 22  ALKPHOS 98  BILITOT 1.6*  PROT 7.7  ALBUMIN  3.9   No results for input(s): LIPASE, AMYLASE in the last 72 hours. CBC: Recent Labs    03/03/24 1546 03/03/24 1551 03/04/24 1503  WBC 21.2*  --  19.3*  NEUTROABS 19.0*  --   --   HGB 13.5 15.0 11.0*  HCT 41.0 44.0 33.5*  MCV 95.8  --  96.8  PLT 361  --  294    Hemoglobin A1C: Recent Labs    03/03/24 1948  HGBA1C 5.3   Fasting Lipid Panel: Recent Labs    03/04/24 0241  CHOL 195  HDL 37*  LDLCALC 127*  TRIG 157*  CHOLHDL 5.3    Imaging: DG Knee 1-2 Views Right Result Date: 03/04/2024 CLINICAL DATA:  Right knee pain EXAM: RIGHT KNEE - 2 VIEW COMPARISON:  None Available. FINDINGS: There are no findings of fracture or dislocation. No joint effusion. 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 Vascular  calcifications. Soft tissues are unremarkable. 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 Electronically Signed   By: Limin  Xu M.D.   On: 03/04/2024 17:35   MR BRAIN WO CONTRAST Result Date: 03/04/2024 EXAM: MRI BRAIN WITHOUT CONTRAST 03/04/2024 11:55:56 AM TECHNIQUE: Multiplanar multisequence MRI of the head/brain was performed without the administration of intravenous contrast. COMPARISON: CT head and CTA head and neck yesterday. CLINICAL HISTORY: 73 year old male. Code stroke presentation yesterday. FINDINGS: BRAIN AND VENTRICLES: Small cortically based infarct with restricted diffusion in the superior left occipital pole (series 3 image 31). Faint if any associated cytotoxic edema. No other diffusion restriction. Small chronic infarcts scattered in the right cerebellar hemisphere, in the brainstem (especially the left pons), and in the bilateral deep gray matter nuclei. Small area of chronic cortical encephalomalacia left anterior superior frontal gyrus (series 6 image 23). Patchy moderate additional bilateral white matter T2 and FLAIR hyperintensity, with asymmetric left corona radiata involvement. No chronic cerebral blood products on T2*. No acute intracranial hemorrhage. No mass effect. No midline shift. No hydrocephalus. The sella is unremarkable. Normal flow  voids. ORBITS: Postoperative changes to both globes. SINUSES AND MASTOIDS: Trace mastoid air cell effusions, appear inconsequential. BONES AND SOFT TISSUES: Normal marrow signal. Normal visible cervical spine. No acute soft tissue abnormality. IMPRESSION: 1. Very Small acute cortical infarct left superior occipital lobe. No intracranial hemorrhage or mass effect. 2. Chronic small and medium sized vessel ischemia, moderate to severe for age. Electronically signed by: Helayne Hurst MD 03/04/2024 01:44 PM EST RP Workstation: [id number]   ECHOCARDIOGRAM COMPLETE Result Date:  03/04/2024    ECHOCARDIOGRAM REPORT   Patient Name:   [name] [name] Date of Exam: 03/04/2024 Medical Rec #:  [id number]     Height:       66.0 in Accession #:    [id number]    Weight:       198.0 lb Date of Birth:  [birth year]     BSA:          1.991 m Patient Age:    73 years      BP:           163/99 mmHg Patient Gender: M             HR:           103 bpm. Exam Location:  Inpatient Procedure: 2D Echo (Both Spectral and Color Flow Doppler were utilized during            procedure). Indications:    stroke  History:        Patient has prior history of Echocardiogram examinations, most                 recent 08/09/2023. Cirrhosis; Risk Factors:Hypertension,                 Dyslipidemia and Sleep Apnea.  Sonographer:    Tinnie Barefoot RDCS Referring Phys: [id number] ASHISH ARORA  Sonographer Comments: Technically difficult study due to poor echo windows and suboptimal subcostal window. Image acquisition challenging due to respiratory motion. IMPRESSIONS  1. Left ventricular ejection fraction, by estimation, is >75%. The left ventricle has hyperdynamic function. The left ventricle has no regional wall motion abnormalities. There is mild left ventricular hypertrophy. Left ventricular diastolic parameters are consistent with Grade I diastolic dysfunction (impaired relaxation).  2. Right ventricular systolic function is normal. The right ventricular size is normal.  3. The mitral valve is normal in structure. No evidence of mitral valve regurgitation. No evidence of mitral stenosis. The mean mitral valve gradient is 3.7 mmHg. Moderate mitral annular calcification.  4. The aortic valve is normal in structure. Aortic valve regurgitation is not visualized. No aortic stenosis is present.  5. The inferior vena cava is normal in size with greater than 50% respiratory variability, suggesting right atrial pressure of 3 mmHg. Conclusion(s)/Recommendation(s): No intracardiac source of embolism detected on this transthoracic study.  Consider a transesophageal echocardiogram to exclude cardiac source of embolism if clinically indicated. FINDINGS  Left Ventricle: Left ventricular ejection fraction, by estimation, is >75%. The left ventricle has hyperdynamic function. The left ventricle has no regional wall motion abnormalities. Definity contrast agent was given IV to delineate the left ventricular endocardial borders. The left ventricular internal cavity size was normal in size. There is mild left ventricular hypertrophy. Left ventricular diastolic parameters are consistent with Grade I diastolic dysfunction (impaired relaxation). Right Ventricle: The right ventricular size is normal. No increase in right ventricular wall thickness. Right ventricular systolic function is normal. Left Atrium: Left atrial size was normal in size. Right Atrium: Right atrial  size was normal in size. Pericardium: There is no evidence of pericardial effusion. Mitral Valve: The mitral valve is normal in structure. Moderate mitral annular calcification. No evidence of mitral valve regurgitation. No evidence of mitral valve stenosis. MV peak gradient, 11.5 mmHg. The mean mitral valve gradient is 3.7 mmHg. Tricuspid Valve: The tricuspid valve is normal in structure. Tricuspid valve regurgitation is not demonstrated. No evidence of tricuspid stenosis. Aortic Valve: The aortic valve is normal in structure. Aortic valve regurgitation is not visualized. No aortic stenosis is present. Aortic valve mean gradient measures 13.7 mmHg. Aortic valve peak gradient measures 26.6 mmHg. Aortic valve area, by VTI measures 1.27 cm. Pulmonic Valve: The pulmonic valve was normal in structure. Pulmonic valve regurgitation is not visualized. No evidence of pulmonic stenosis. Aorta: The aortic root is normal in size and structure. Venous: The inferior vena cava is normal in size with greater than 50% respiratory variability, suggesting right atrial pressure of 3 mmHg. IAS/Shunts: No atrial  level shunt detected by color flow Doppler.  LEFT VENTRICLE PLAX 2D LVIDd:         3.80 cm   Diastology LVIDs:         2.10 cm   LV e' medial:    6.20 cm/s LV PW:         1.30 cm   LV E/e' medial:  8.3 LV IVS:        1.30 cm   LV e' lateral:   8.38 cm/s LVOT diam:     1.80 cm   LV E/e' lateral: 6.1 LV SV:         44 LV SV Index:   22 LVOT Area:     2.54 cm  RIGHT VENTRICLE RV Basal diam:  2.70 cm RV S prime:     23.00 cm/s LEFT ATRIUM           Index        RIGHT ATRIUM           Index LA diam:      3.60 cm 1.81 cm/m   RA Area:     13.60 cm LA Vol (A4C): 43.5 ml 21.85 ml/m  RA Volume:   30.00 ml  15.07 ml/m  AORTIC VALVE AV Area (Vmax):    1.21 cm AV Area (Vmean):   1.23 cm AV Area (VTI):     1.27 cm AV Vmax:           257.67 cm/s AV Vmean:          164.333 cm/s AV VTI:            0.342 m AV Peak Grad:      26.6 mmHg AV Mean Grad:      13.7 mmHg LVOT Vmax:         123.00 cm/s LVOT Vmean:        79.550 cm/s LVOT VTI:          0.171 m LVOT/AV VTI ratio: 0.50  AORTA Ao Root diam: 3.50 cm MITRAL VALVE MV Area VTI:  1.26 cm      SHUNTS MV Peak grad: 11.5 mmHg     Systemic VTI:  0.17 m MV Mean grad: 3.7 mmHg      Systemic Diam: 1.80 cm MV Vmax:      1.69 m/s MV Vmean:     87.2 cm/s MV E velocity: 51.40 cm/s MV A velocity: 146.00 cm/s MV E/A ratio:  0.35 Oneil Parchment MD Electronically signed by Oneil  Skains MD Signature Date/Time: 03/04/2024/12:54:41 PM    Final    CT CHEST ABDOMEN PELVIS W CONTRAST Result Date: 03/04/2024 EXAM: CT CHEST, ABDOMEN AND PELVIS WITH CONTRAST 03/04/2024 11:10:43 AM TECHNIQUE: CT of the chest, abdomen and pelvis was performed with the administration of intravenous contrast. Multiplanar reformatted images are provided for review. Automated exposure control, iterative reconstruction, and/or weight based adjustment of the mA/kV was utilized to reduce the radiation dose to as low as reasonably achievable. CONTRAST: 75mL of omnipaque  350. COMPARISON: CT Chest Abdomen Pelvis with contrast  10/21/2019. CLINICAL HISTORY: Polytrauma, blunt. FINDINGS: CHEST: MEDIASTINUM AND LYMPH NODES: Heart and pericardium are unremarkable. Coronary artery calcifications are noted. The central airways are clear. No mediastinal, hilar or axillary lymphadenopathy. LUNGS AND PLEURA: No focal consolidation or pulmonary edema. No pleural effusion or pneumothorax. ABDOMEN AND PELVIS: LIVER: The liver is unremarkable. GALLBLADDER AND BILE DUCTS: Gallbladder is unremarkable. No biliary ductal dilatation. SPLEEN: No acute abnormality. PANCREAS: No acute abnormality. ADRENAL GLANDS: No acute abnormality. KIDNEYS, URETERS AND BLADDER: Possible bilateral nephrolithiasis is noted. No stones in the ureters. No hydronephrosis. No perinephric or periureteral stranding. Urinary bladder is unremarkable. GI AND BOWEL: Stomach demonstrates no acute abnormality. Diverticulosis of descending and sigmoid colon is noted without inflammation. There is no bowel obstruction. Status post appendectomy. REPRODUCTIVE ORGANS: No acute abnormality. PERITONEUM AND RETROPERITONEUM: No ascites. No free air. VASCULATURE: Aorta is normal in caliber. Aortic atherosclerosis. ABDOMINAL AND PELVIS LYMPH NODES: No lymphadenopathy. BONES AND SOFT TISSUES: Old bilateral rib fractures are noted. Old L1 compression fracture is noted. Moderately displaced and comminuted fracture of intertrochanteric region of proximal right femur is noted. No focal soft tissue abnormality. IMPRESSION: 1. Moderately displaced and comminuted intertrochanteric fracture of the proximal right femur, acute. 2. Old bilateral rib fractures, chronic. 3. Chronic L1 vertebral body compression fracture, chronic. 4. Status post appendectomy. 5. Aortic atherosclerosis. 6. Coronary artery calcifications. 7. Diverticulosis of the descending and sigmoid colon without evidence of inflammation. 8. Possible bilateral nephrolithiasis. Electronically signed by: Lynwood Seip MD 03/04/2024 11:45 AM EST RP  Workstation: [id number]    Cardiac Studies:  ECG: ST vs atrial tachycardia    Telemetry:  SR  rates 60's   Echo: EF > 75% read as no AS but wrong mean gradient 3.7 peak 26.6 AVA 1.2 cm2 moderate AS   Medications:    amiodarone  200 mg Oral BID   aspirin  EC  81 mg Oral Daily   atorvastatin  40 mg Oral Daily   bisoprolol  10 mg Oral Daily   Chlorhexidine  Gluconate Cloth  6 each Topical Daily   ezetimibe   10 mg Oral Daily   pantoprazole  (PROTONIX ) IV  40 mg Intravenous QHS   QUEtiapine   25 mg Oral QHS       ceFAZolin  (ANCEF ) IV     clevidipine Stopped (03/03/24 2151)   tranexamic acid      Assessment/Plan:   Arrhythmia. Has a long list of allergies with no specific descriptions. Dr Creta notes indicate non compliance. Will try Zebeta for rate control 10 mg. He has not been tried on this before. ( Lopressor /Atenolol ) More atrial tachycardia than flutter not a good long term candidate for anticoagulation with ETOH abuse  Rhythm much better sinus in 60's on bisoprolol and low dose amiodarone.  Femur fracture:  ok for surgery from cardiac standpoint  Appears to be scheduled for 5:00 pm today AS moderate stable EF > 75% outpatient f/u [facility] 03/06/2024, 9:19 AM

## 2024-03-06 NOTE — Transfer of Care (Signed)
 Immediate Anesthesia Transfer of Care Note  Patient: [name] [name]  Procedure(s) Performed: FIXATION, FRACTURE, INTERTROCHANTERIC, WITH INTRAMEDULLARY ROD (Right)  Patient Location: PACU  Anesthesia Type:General  Level of Consciousness: awake and alert   Airway & Oxygen Therapy: Patient Spontanous Breathing and Patient connected to nasal cannula oxygen  Post-op Assessment: Report given to RN and Post -op Vital signs reviewed and stable  Post vital signs: Reviewed and stable  Last Vitals:  Vitals Value Taken Time  BP 159/54 03/06/24 19:02  Temp 36.6 C 03/06/24 19:02  Pulse 63 03/06/24 19:09  Resp 13 03/06/24 19:09  SpO2 94 % 03/06/24 19:09  Vitals shown include unfiled device data.  Last Pain:  Vitals:   03/06/24 1546  TempSrc: Oral  PainSc:          Complications: No notable events documented.

## 2024-03-06 NOTE — Progress Notes (Signed)
 STROKE TEAM PROGRESS NOTE    SIGNIFICANT HOSPITAL EVENTS 11/1 presented with confusion, aphasia and right side weakness. Received IV TNK @ 1630  INTERIM HISTORY/SUBJECTIVE Patient is neurologically stable.  Sitting up comfortably in bed remains disoriented and confused but is able to move all 4 extremities well against gravity except for right leg which he movement is limited due to hip fracture.  He is able to answer some questions, follows commands.   Plan for hip surgery later today  CBC    Component Value Date/Time   WBC 19.3 (H) 03/04/2024 1503   RBC 3.46 (L) 03/04/2024 1503   HGB 11.0 (L) 03/04/2024 1503   HCT 33.5 (L) 03/04/2024 1503   PLT 294 03/04/2024 1503   MCV 96.8 03/04/2024 1503   MCH 31.8 03/04/2024 1503   MCHC 32.8 03/04/2024 1503   RDW 14.0 03/04/2024 1503   LYMPHSABS 1.0 03/03/2024 1546   MONOABS 1.1 (H) 03/03/2024 1546   EOSABS 0.0 03/03/2024 1546   BASOSABS 0.1 03/03/2024 1546    BMET    Component Value Date/Time   NA 135 03/04/2024 1503   NA 137 10/07/2023 1311   K 3.3 (L) 03/04/2024 1503   CL 104 03/04/2024 1503   CO2 15 (L) 03/04/2024 1503   GLUCOSE 153 (H) 03/04/2024 1503   BUN 32 (H) 03/04/2024 1503   BUN 22 10/07/2023 1311   CREATININE 1.92 (H) 03/04/2024 1503   CALCIUM 8.4 (L) 03/04/2024 1503   EGFR 30.0 01/20/2024 0936   EGFR 69 10/07/2023 1311   GFRNONAA 36 (L) 03/04/2024 1503    IMAGING past 24 hours No results found.   Vitals:   03/05/24 2023 03/06/24 0036 03/06/24 0446 03/06/24 0708  BP: (!) 152/57 (!) 154/54 (!) 145/55 (!) 153/62  Pulse:  (!) 54 (!) 53 (!) 54  Resp:   18 19  Temp:  97.8 F (36.6 C) 98 F (36.7 C) 98.2 F (36.8 C)  TempSrc:  Oral Oral Oral  SpO2:  99% 99% 100%  Weight:      Height:         PHYSICAL EXAM General:  Alert, well-nourished, well-developed patient in no acute distress Psych:  Mood and affect appropriate for situation CV: Regular rate and rhythm on monitor Respiratory:  Regular, unlabored  respirations on room air GI: Abdomen soft and nontender   NEURO:  Mental Status: Awake and alert, confused, able to follow commands.  Very hard of hearing.  Some mild aphasia  Cranial Nerves:  II: PERRL. Visual fields full.  III, IV, VI: EOMI. Eyelids elevate symmetrically.  V: Sensation is intact to light touch and symmetrical to face.  VII: Face is symmetrical resting and smiling VIII: Very hard of hearing IX, X: Palate elevates symmetrically. Phonation is normal.  KP:Dynloizm shrug 5/5. XII: tongue is midline without fasciculations. Motor: Moves bilateral uppers and left leg spontaneous and antigravity, right leg minimal movement due to right femur fracture can wiggle toes Tone: is normal and bulk is normal Sensation- Intact to light touch bilaterally. Extinction absent to light touch to DSS.   Coordination: FTN intact bilaterally, HKS: no ataxia in BLE.No drift.  Gait- deferred  Most Recent NIH 8   ASSESSMENT/PLAN  Mr. KELIJAH TOWRY is a 73 y.o. 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  Per  EMS will check was called and EMS found him on the floor with a laceration on his head, noted to be confused agitated, with left gaze preference and hypertensive at 240/110. NIH on Admission 18  Strokelike episode: Presenting with confusion aphasia and left gaze deviation.  s/p TNK with fall and right hip femur fracture.  MRI shows small left occipital infarct which does not explain his presenting symptoms Etiology: Likely cardioembolic Code Stroke  CT head No acute abnormality. ASPECTS 10.  Old left frontal infarct and chronic ischemic white matter changes  CTA head & neck No LVO Atherosclerotic disease at both carotid bifurcations without hemodynamically significant stenosis  MRI small left  superior occipital cortical infarct 2D Echo EF > 75%.  Mild LVH with grade 1 diastolic dysfunction LDL 127 HgbA1c 5.3 VTE prophylaxis -SCDs No antithrombotic prior to admission, now on aspirin  alone because of impending hip fracture surgery Therapy recommendations:  Pending Disposition: Pending  Hx of Stroke/TIA Old left frontal infarct on brain imaging  S/p unwitnessed fall Acute right femur fracture Pelvis x-ray Right femoral intertrochanteric fracture with displacement and varus angulation. CT chest abdomen pelvis:  1. Moderately displaced and comminuted intertrochanteric fracture of the proximal right femur, acute. 2. Old bilateral rib fractures, chronic. 3. Chronic L1 vertebral body compression fracture, chronic. 4. Status post appendectomy. 5. Aortic atherosclerosis. 6. Coronary artery calcifications. 7. Diverticulosis of the descending and sigmoid colon without evidence of inflammation. 8. Possible bilateral nephrolithiasis. Ortho consulted   Hypertension CHF Home meds: Amlodipine  5 mg, propranolol  20 mg Stable Home meds on hold currently Blood Pressure Goal: BP less than 180/105   Hyperlipidemia Home meds: Zetia  10 mg,  resumed in hospital LDL 127, goal < 70 Continue statin at discharge  Substance Abuse History of EtOH abuse History of liver cirrhosis, esophageal varices UDS positive ordered ETOH use, alcohol level <15, advised to drink no more than 2 drink(s) a day TOC consult for cessation placed  Dysphagia Patient has post-stroke dysphagia, SLP consulted    Diet   Diet NPO time specified   Advance diet as tolerated  Other Stroke Risk Factors Obesity, Body mass index is 31.95 kg/m., BMI >/= 30 associated with increased stroke risk, recommend weight loss, diet and exercise as appropriate  Coronary artery disease Congestive heart failure Obstructive sleep apnea  Other Active Problems  Hospital day # 3    Patient neurological exam is stable.   Plan for hip surgery later today.  May need 30-day heart monitor at discharge to look for paroxysmal A-fib.  No family available at the bedside for discussion.   I personally spent a total of 35 minutes in the care of the patient today including getting/reviewing separately obtained history, performing a medically appropriate exam/evaluation, counseling and educating, placing orders, referring and communicating with other health care professionals, documenting clinical information in the EHR, independently interpreting results, and coordinating care.            Eather Popp, MD Medical Director [facility] Pager: [phone] 03/06/2024 12:32 PM  To contact Stroke Continuity provider, please refer to [email or web address]. After hours, contact General Neurology

## 2024-03-06 NOTE — H&P (Signed)
 Orthopedic Surgery H&P Update  Patient's history and physical reviewed - no updates at this time Risks of surgery were covered again, patient elected to continue with planned procedure Written consent verified Site marked Hold anticoagulation in anticipation of spine surgery Ancef  and TXA on call to OR To OR when ready  EHL/TA/GSC intact on the right. SILT in s/s/dp/dp/t nerve distributions on the right. Foot warm and well perfused on the right.    [name] Ada, MD Orthopedic Surgeon

## 2024-03-06 NOTE — Op Note (Signed)
 Orthopedic Surgery Operative Report   Procedure: Right intertrochanteric fracture open reduction internal fixation with cephalomedullary rod   Modifier: none   Date of procedure: 03/06/2024   Patient name: [name] [name]   MRN: [id number]  DOB: 07/29/1950   Surgeon: Ozell Ada, MD Assistant: none Pre-operative diagnosis: right intertrochanteric femur fracture Post-operative diagnosis: same as above Findings: right displaced and shortened intertrochanteric femur fracture   Specimens: none Anesthesia: general EBL: 200cc Complications: none Pre-incision antibiotic: ancef  TXA was given prior to incision as well    Implants:  Implant Name Type Inv. Item Serial No. Manufacturer Lot No. LRB No. Used Action  NAIL TRIGEN [id number] RT - [id number] Nail NAIL [employer]IGEN [id number] RT  [employer] [id number] Right 1 Implanted  SCREW LAG COMPR KIT 100/95 - [id number] Screw SCREW LAG COMPR KIT 100/95  [employer] [id number] Right 1 Implanted  SCREW TRIGEN LOW PROF 5.0X37.5 - [id number] Screw SCREW TRIGEN LOW PROF 5.0X37.5  [employer] [id number] Right 1 Implanted  SCREW TRIGEN LOW PROF 5.0X32.5 - [id number] Screw SCREW TRIGEN LOW PROF 5.0X32.5  [employer] [id number] Right 1 Implanted       Indication for procedure: Patient is a 73 y.o. male who presented to the ER after a ground level fall. The patient had right hip pain and x-rays revealed an intertrochanteric femur fracture. The patient was admitted to a medicine service with orthopedics consulted. I met the patient and his family. I discussed the fracture. I recommended operative management in the form of intramedullary rodding to stabilize the fracture and allow for mobilization. The risks, benefits, and alternatives of the proposed surgery were covered with the patient and his family. All their questions were answered to their satisfaction. After our discussion, patient and  his brother elected to proceed with surgery.    Procedure Description: The patient was met in the pre-operative holding area. The patient's identity and consent were verified. The operative site was marked by myself. The patient's remaining questions about the surgery were answered. The patient was brought back to the operating room. General anesthesia was induced and an endotracheal tube was placed by the anesthesia staff. The patient was transferred to the [facility] table. All bony prominences were well padded. Traction was applied and an attempt at closed reduction with the Hana table was made. Fluoroscopy confirmed a satisfactory reduction with slight valgus alignment. The surgical area was cleansed with alcohol. Ancef  and TXA were administered by anesthesia. The patient's skin was then prepped and draped in a standard, sterile fashion. A time out was performed that identified the patient, the procedure, and the operative site. All team members agreed with what was stated in the time out.    An incision was made just proximal and inferior to the greater trochanter. The incision was taken sharply down through the fascia. A guide pin was inserted into the wound onto the top of the greater trochanter. Fluoroscopy was used to place the guide pin at the starting point at the tip of the greater trochanter and in line with the middle of the femoral neck. The wire was then advanced to a point just past the lesser trochanter. A soft tissue sleeve was advanced over the wire onto the greater trochanter. An entry reamer was used to open the proximal femoral canal under fluoroscopic guidance. The pin and reamer were removed. A long guide wire was placed down the femoral canal. It was advanced to the superior aspect of the patella  under fluoroscopy. The length of the nail was estimated off of the guide wire. The nail measure about 38 so a 36 was selected. A 9mm reamer was inserted over the guidewire and used to ream the  femoral canal. It was advanced down past the isthmus under fluoroscopic guidance. The canal was serially reamed with increasing sized reamers until a 11.29mm reamer at which point there was chatter. A 10 nail was selected. The nail was advanced over the guidewire. It was advanced until the lag screws were estimated to end in the center of the femoral head. The guide wire was removed.    An incision was made sharply through the skin, dermis, and fascia over the lateral thigh in the area where the lag screws would be inserted. The lag screw inserter was placed through the jig onto the lateral femoral cortex. A guide wire was advanced through the lag screw inserted into the femoral head under fluoroscopic guidance. It was found to be in acceptable position on the AP and lateral views. The length of the lag screw was estimated off of the guide wire. A screw was selected. The inferior lag screw was drilled through the guide. The derotation device was placed through the inserter. The proximal lag screw hole was then drilled over the guide wire. The screw was inserted over the wire under fluoroscopic guidance. The derotation bar was removed and the inferior lag screw was inserted.    The C arm was then brought to the knee in a lateral position to obtain perfect circles at the distal interlocking holes. An incision was made over the distal interlocking holes on the lateral aspect of the femur. This incision was taken down through fascia. A drill was inserted into the wound and placed over the interlocking hole using perfect circle technique. The hole was then drilled bicortically. A depth gauge was used to estimate the screw length. A 37.19mm screw was selected for the more distal hole. This was inserted and there was good purchase. The same process was then repeated to insert a 32.39mm screw in the more proximal hole.    Final AP and lateral fluoroscopic images were then taken of the hip, femur, and knee showing  satisfactory reduction and placement of the cephalomedullary rod. The wounds were copiously irrigated with sterile saline. Vancomycin  powder was placed into the wounds. The fascia was closed with 0 vicryl. The deep dermal layer was closed with 2-0 vicryl. The skin was closed with staples. Dressings were applied. All counts were correct at the end of the case. Patient was transferred back to a hospital bed. The patient was awakened from anesthesia and brought back to the post-anesthesia care unit in stable condition.     Post-operative plan: The patient will recover in the post-anesthesia care unit and then go to the floor on the medicine service. The patient will receive two post-operative doses of ancef . He will get another dose of TXA. The patient will be weight bearing as tolerated. The patient will work with physical therapy. The patient's disposition will be determined by the medicine service.        Ozell Ada, MD Orthopedic Surgeon

## 2024-03-06 NOTE — Care Management Important Message (Signed)
 Important Message  Patient Details  Name: [name] [name] MRN: [id number] Date of Birth: 10/27/1950   Important Message Given:  Yes - Medicare IM     Claretta Deed 03/06/2024, 2:58 PM

## 2024-03-06 NOTE — Discharge Instructions (Addendum)
 Orthopedic Surgery Discharge Instructions  Patient name: [name] [name] Fracture: right intertrochanteric femur fracture Procedure Performed: right hip cephalomedullary rod Date of Surgery: 03/06/2024 Surgeon: Ozell Ada, MD  Activity: You are allowed to put as much weight on your leg as you would like. You can walk as much as you would like. You can perform household activities such as cleaning dishes, doing laundry, vacuuming, etc.  Incision Care: Your incision sites have dressings over them. The dressings should remain in place and dry at all times for a total of one week after surgery. After one week, you can remove the dressings. Underneath the dressings, you will find skin staples. You should leave these staples in place. They will be taken out in the office when the wound has healed. Do not pick, rub, or scrub at them. Do not put cream or lotion over the surgical area. After one week and once the dressing is off, it is okay to let soap and water run over your incision. Again, do not pick, scrub, or rub at the staples when bathing. Do not submerge (e.g., take a bath, swim, go in a hot tub, etc.) until six weeks after surgery. There may be some bloody drainage from the incision into the dressing after surgery. This is normal. You do not need to replace the dressing. Continue to leave it in place for the one week as instructed above. Should the dressing become saturated with blood or drainage, please call the office for further instructions.   Medications: You have been prescribed Norco. This is a narcotic pain medication and should only be taken as prescribed. You should not drink alcohol or operate heavy machinery (including driving) while taking this medication. The Norco can cause constipation as a side effect. For that reason, you have been prescribed senna and miralax . These are both laxatives. You do not need to take this medication if you develop diarrhea. Should you remain constipated even  while taking the senna and miralax , please use the miralax  twice daily. Norco contains tylenol  within it so do not take additional tylenol  when using the Norco.   You have been prescribed Xarelto as a blood thinner. This medication is to be taken to prevent blood clots. Take 10 milligrams daily. You should refrain from using other blood thinners (warfarin, apixaban, plavix, xarelto, etc.) while using the aspirin . You will need to take this medication for a total of 6 weeks after your surgery.   You should not use over-the-counter NSAIDs (ibuprofen, Aleve, Celebrex, naproxen, meloxicam, etc.) for pain relief because there is evidence that these medication decrease fracture healing.   In order to set expectations for opioid prescriptions, you will only be prescribed opioids for a total of six weeks after surgery and, at two-weeks after surgery, your opioid prescription will start to tapered (decreased dosage and number of pills). If you have ongoing need for opioid medication six weeks after surgery, you will be referred to pain management. If you are already established with a provider that is giving you opioid medications, you should schedule an appointment with them for six weeks after surgery if you feel you are going to need another prescription. State law only allows for opioid prescriptions one week at a time. If you are running out of opioid medication near the end of the week, please call the office during business hours before running out so I can send you another prescription.   Driving: You should not drive while taking narcotic pain medications. You should  start getting back to driving slowly and you may want to try driving in a parking lot before doing anything more.   Diet: You are safe to resume your regular diet after surgery.   Reasons to Call the Office After Surgery: You should feel free to call the office with any concerns or questions you have in the post-operative period, but you  should definitely notify the office if you develop: -shortness of breath, chest pain, or trouble breathing -excessive bleeding, drainage, redness, or swelling around the surgical site -fevers, chills, or pain that is getting worse with each passing day -persistent nausea or vomiting -new weakness in the right lower extremity, new or worsening numbness or tingling in the right lower extremity -other concerns about your surgery  Follow Up Appointments: You have a follow up visit scheduled with Dr. Georgina on 03/22/2024 at 3:15pm. The office location and phone number are listed below. Please arrive on time.    Office Information:  -Ozell Georgina, MD -Phone number: [phone] -Address: [address]       [place], [place] [address]   =====================================  Information on my medicine - XARELTO (Rivaroxaban)  This medication education was reviewed with me or my healthcare representative as part of my discharge preparation.    Why was Xarelto prescribed for you? Xarelto was prescribed for you to reduce the risk of blood clots forming after orthopedic surgery. The medical term for these abnormal blood clots is venous thromboembolism (VTE).  What do you need to know about xarelto ? Take your Xarelto ONCE DAILY at the same time every day. You may take it either with or without food.  If you have difficulty swallowing the tablet whole, you may crush it and mix in applesauce just prior to taking your dose.  Take Xarelto exactly as prescribed by your doctor and DO NOT stop taking Xarelto without talking to the doctor who prescribed the medication.  Stopping without other VTE prevention medication to take the place of Xarelto may increase your risk of developing a clot.  After discharge, you should have regular check-up appointments with your healthcare provider that is prescribing your Xarelto.    What do you do if you miss a dose? If you miss a dose, take it as soon  as you remember on the same day then continue your regularly scheduled once daily regimen the next day. Do not take two doses of Xarelto on the same day.   Important Safety Information A possible side effect of Xarelto is bleeding. You should call your healthcare provider right away if you experience any of the following: Bleeding from an injury or your nose that does not stop. Unusual colored urine (red or dark brown) or unusual colored stools (red or black). Unusual bruising for unknown reasons. A serious fall or if you hit your head (even if there is no bleeding).  Some medicines may interact with Xarelto and might increase your risk of bleeding while on Xarelto. To help avoid this, consult your healthcare provider or pharmacist prior to using any new prescription or non-prescription medications, including herbals, vitamins, non-steroidal anti-inflammatory drugs (NSAIDs) and supplements.  This website has more information on Xarelto: visitdestination.com.br.

## 2024-03-06 NOTE — Anesthesia Procedure Notes (Signed)
 Procedure Name: Intubation Date/Time: 03/06/2024 4:57 PM  Performed by: Lockie Flesher, CRNAPre-anesthesia Checklist: Patient identified, Emergency Drugs available, Suction available and Patient being monitored Patient Re-evaluated:Patient Re-evaluated prior to induction Oxygen Delivery Method: Circle System Utilized Preoxygenation: Pre-oxygenation with 100% oxygen Induction Type: IV induction Ventilation: Mask ventilation without difficulty Laryngoscope Size: Glidescope and 3 Grade View: Grade I Tube type: Oral Tube size: 7.0 mm Number of attempts: 1 Airway Equipment and Method: Stylet and Oral airway Placement Confirmation: ETT inserted through vocal cords under direct vision, positive ETCO2 and breath sounds checked- equal and bilateral Secured at: 23 cm Tube secured with: Tape Dental Injury: Teeth and Oropharynx as per pre-operative assessment

## 2024-03-06 NOTE — Progress Notes (Signed)
 OT Cancellation Note  Patient Details Name: [name] [name] MRN: [id number] DOB: July 16, 1950   Cancelled Treatment:    Reason Eval/Treat Not Completed: Other (comment) (per chart review, plans for surgery today. Will follow up as pt medically ready and schedule allows.)  Marlisa Caridi D Walton, OTD, OTR/L [facility] Office: [phone]   [name] 03/06/2024, 3:02 PM

## 2024-03-07 ENCOUNTER — Telehealth (HOSPITAL_COMMUNITY): Payer: Self-pay

## 2024-03-07 ENCOUNTER — Other Ambulatory Visit (HOSPITAL_COMMUNITY): Payer: Self-pay

## 2024-03-07 DIAGNOSIS — I350 Nonrheumatic aortic (valve) stenosis: Secondary | ICD-10-CM | POA: Diagnosis not present

## 2024-03-07 DIAGNOSIS — I4719 Other supraventricular tachycardia: Secondary | ICD-10-CM | POA: Diagnosis not present

## 2024-03-07 LAB — CBC
HCT: 22.6 % — ABNORMAL LOW (ref 39.0–52.0)
Hemoglobin: 7.3 g/dL — ABNORMAL LOW (ref 13.0–17.0)
MCH: 32.2 pg (ref 26.0–34.0)
MCHC: 32.3 g/dL (ref 30.0–36.0)
MCV: 99.6 fL (ref 80.0–100.0)
Platelets: 225 K/uL (ref 150–400)
RBC: 2.27 MIL/uL — ABNORMAL LOW (ref 4.22–5.81)
RDW: 13.9 % (ref 11.5–15.5)
WBC: 14.8 K/uL — ABNORMAL HIGH (ref 4.0–10.5)
nRBC: 0 % (ref 0.0–0.2)

## 2024-03-07 MED ORDER — PANTOPRAZOLE SODIUM 40 MG PO TBEC
40.0000 mg | DELAYED_RELEASE_TABLET | Freq: Every day | ORAL | Status: DC
  Administered 2024-03-07 – 2024-03-11 (×5): 40 mg via ORAL
  Filled 2024-03-07 (×5): qty 1

## 2024-03-07 NOTE — Progress Notes (Signed)
 Cardiologist:  Santo  Subjective:  Denies SSCP, palpitations or Dyspnea More alert   Objective:  Vitals:   03/07/24 0341 03/07/24 0423 03/07/24 0808 03/07/24 0823  BP: (!) 136/57 (!) 136/57 (!) 158/61   Pulse:   (!) 54   Resp: 20  (!) 21 14  Temp: 97.9 F (36.6 C)  99.5 F (37.5 C)   TempSrc: Oral  Oral   SpO2: 97%  100%   Weight:      Height:        Intake/Output from previous day:  Intake/Output Summary (Last 24 hours) at 03/07/2024 0831 Last data filed at 03/07/2024 9191 Gross per 24 hour  Intake 1330 ml  Output 1150 ml  Net 180 ml    Physical Exam:  Alert   AS murmur Lungs clear Abdomen benign Right femur fracture post op day one  Plus one edema  Lab Results: Basic Metabolic Panel: Recent Labs    03/04/24 1503  NA 135  K 3.3*  CL 104  CO2 15*  GLUCOSE 153*  BUN 32*  CREATININE 1.92*  CALCIUM 8.4*    CBC: Recent Labs    03/04/24 1503  WBC 19.3*  HGB 11.0*  HCT 33.5*  MCV 96.8  PLT 294     Imaging: DG FEMUR, MIN 2 VIEWS RIGHT Result Date: 03/06/2024 EXAM: 2 VIEW(S) XRAY OF THE RIGHT FEMUR 03/06/2024 07:48:43 PM COMPARISON: 03/03/2024 CLINICAL HISTORY: 747648 Post-operative state 747648 Post-operative state FINDINGS: BONES AND JOINTS: Internal fixation is present across the right femoral intertrochanteric fracture with near anatomic alignment. No hardware complicating feature. No joint dislocation. SOFT TISSUES: The soft tissues are unremarkable. IMPRESSION: 1. Internal fixation across the right femoral intertrochanteric fracture with near anatomic alignment and no hardware complication. Electronically signed by: Franky Crease MD 03/06/2024 09:29 PM EST RP Workstation: [id number]   DG FEMUR, MIN 2 VIEWS RIGHT Result Date: 03/06/2024 EXAM: Multiple VIEW(S) XRAY OF THE RIGHT FEMUR 03/06/2024 06:30:00 PM COMPARISON: None available. CLINICAL HISTORY: Surgery, elective [id number]. FINDINGS: BONES AND JOINTS: Multiple intraoperative spot images  demonstrate placement of an intramedullary nail and dynamic screw across the right femoral intertrochanteric fracture. Anatomic alignment. No hardware complications. SOFT TISSUES: The soft tissues are unremarkable. IMPRESSION: 1. Placement of intramedullary nail and dynamic screw across the right femoral intertrochanteric fracture with anatomic alignment and no hardware complications. Electronically signed by: Franky Crease MD 03/06/2024 09:28 PM EST RP Workstation: [id number]   DG C-Arm 1-60 Min-No Report Result Date: 03/06/2024 Fluoroscopy was utilized by the requesting physician.  No radiographic interpretation.   DG C-Arm 1-60 Min-No Report Result Date: 03/06/2024 Fluoroscopy was utilized by the requesting physician.  No radiographic interpretation.    Cardiac Studies:  ECG: ST vs atrial tachycardia    Telemetry:  SR  rates 60's PVC NSVT 3 beats   Echo: EF > 75% read as no AS but wrong mean gradient 3.7 peak 26.6 AVA 1.2 cm2 moderate AS   Medications:    amiodarone  200 mg Oral BID   atorvastatin  40 mg Oral Daily   bisoprolol  10 mg Oral Daily   Chlorhexidine  Gluconate Cloth  6 each Topical Daily   ezetimibe   10 mg Oral Daily   pantoprazole   40 mg Oral QHS   QUEtiapine   25 mg Oral QHS   rivaroxaban  10 mg Oral Daily       ceFAZolin  (ANCEF ) IV 2 g (03/07/24 0625)    Assessment/Plan:   Arrhythmia. Has a long list of allergies with no  specific descriptions. Dr Creta notes indicate non compliance. Will try Zebeta for rate control 10 mg. He has not been tried on this before. ( Lopressor /Atenolol ) More atrial tachycardia than flutter not a good long term candidate for anticoagulation with ETOH abuse  Rhythm much better sinus in 60's on bisoprolol and low dose amiodarone.  Femur fracture:  Right- post op day one. Cardiac status stable DVT prophylaxis with xarelto 10 mg daily  AS moderate stable EF > 75% outpatient f/u Santo Coy [facility] 03/07/2024, 8:31 AM

## 2024-03-07 NOTE — Anesthesia Postprocedure Evaluation (Signed)
 Anesthesia Post Note  Patient: [name] [name]  Procedure(s) Performed: FIXATION, FRACTURE, INTERTROCHANTERIC, WITH INTRAMEDULLARY ROD (Right)     Patient location during evaluation: PACU Anesthesia Type: General Level of consciousness: sedated and patient cooperative Pain management: pain level controlled Vital Signs Assessment: post-procedure vital signs reviewed and stable Respiratory status: spontaneous breathing Cardiovascular status: stable Anesthetic complications: no   No notable events documented.  Last Vitals:  Vitals:   03/07/24 1600 03/07/24 1800  BP: 138/62   Pulse: 98   Resp: (!) 21 19  Temp: (!) 36.4 C   SpO2: 96%     Last Pain:  Vitals:   03/07/24 1852  TempSrc:   PainSc: 10-Worst pain ever                 Norleen Pope

## 2024-03-07 NOTE — Progress Notes (Signed)
 Orthopedic Surgery Progress Note   Assessment: Patient is a 73 y.o. male with right intertrochanteric femur fracture status post CMN   Plan: -Operative plans: complete -Diet: regular -DVT ppx: Xarelto -Antibiotics: ancef  x2 post-op doses -Weight bearing status: as tolerated -PT evaluate and treat -Pain control -Dispo: per primary  ___________________________________________________________________________  Subjective: No acute events overnight. Pain well controlled. Has not worked with PT yet.   Physical Exam:  General: no acute distress, appears stated age Neurologic: alert, answering questions appropriately, following commands Respiratory: unlabored breathing on room air, symmetric chest rise Psychiatric: appropriate affect, normal cadence to speech  MSK:   -Right lower extremity  Dressings over hip c/d/i EHL/TA/GSC intact Plantarflexes and dorsiflexes toes Sensation intact to light touch in sural, saphenous, tibial, deep peroneal, and superficial peroneal nerve distributions Foot warm and well perfused   Yesterday's total administered Morphine  Milligram Equivalents: 197.5   Patient name: [name] [name] Patient MRN: [id number] Date: 03/07/24

## 2024-03-07 NOTE — Evaluation (Signed)
 Occupational Therapy Evaluation Patient Details Name: [name] [name] MRN: [id number] DOB: 12-02-1950 Today's Date: 03/07/2024   History of Present Illness   Pt is a 73 y.o. male presenting 11/1 as code stroke after being found down with laceration to head, confused, agitated, L gaze preference, R weakness. S/p TNK. MRI shows small left occipital infarct. Also found to have R hip fx, s/p ORIF 11/4. PMH:  alcohol abuse, CHF, cirrhosis of the liver, esophageal varices, GERD, OSA, HTN, HLD, sleep apnea, and vocal cord paralysis on the left     Clinical Impressions PTA, pt lived alone and was independent. Upon eval, pt presents with decreased strength, balance, cognition, and vision. Pt able to read from OT badge, but with deficits noted with visual tracking and max difficulty participating in visual fields assessment. Pt needing up to CGA for seated UB ADL and needing max A +2 for LB ADL, max A +2 for STS with intermittent max A +2 for step pivot. Due to significant functional decline, recommending intensive multidisciplinary rehabilitation >3 hours/day to optimize safety and independence in ADL.       If plan is discharge home, recommend the following:   Two people to help with walking and/or transfers;A lot of help with bathing/dressing/bathroom;Two people to help with bathing/dressing/bathroom;Assistance with cooking/housework;Assist for transportation;Help with stairs or ramp for entrance;Direct supervision/assist for medications management;Direct supervision/assist for financial management     Functional Status Assessment   Patient has had a recent decline in their functional status and demonstrates the ability to make significant improvements in function in a reasonable and predictable amount of time.     Equipment Recommendations   Other (comment) (defer)     Recommendations for Other Services   Rehab consult;Speech consult (cog)     Precautions/Restrictions    Precautions Precautions: Fall Recall of Precautions/Restrictions: Intact Restrictions Weight Bearing Restrictions Per Provider Order: Yes RLE Weight Bearing Per Provider Order: Weight bearing as tolerated     Mobility Bed Mobility Overal bed mobility: Needs Assistance Bed Mobility: Supine to Sit     Supine to sit: Mod assist, +2 for physical assistance     General bed mobility comments: assist to guide B LE's towards EOB with increased time and effort for R LE management. Assist to shift hips using bed pad and to and raise trunk    Transfers Overall transfer level: Needs assistance Equipment used: Rolling walker (2 wheels) Transfers: Sit to/from Stand, Bed to chair/wheelchair/BSC Sit to Stand: Max assist, +2 physical assistance, +2 safety/equipment     Step pivot transfers: Mod assist, Max assist, +2 physical assistance     General transfer comment: MaxAx2 for signifcant boost-up with pt leaning posteriorly. Able to step-pivot towards the left with pt primarily stepping with LLE. Able to take small steps back with both legs with increased time and effort.      Balance Overall balance assessment: Needs assistance, Mild deficits observed, not formally tested, History of Falls Sitting-balance support: Feet supported, No upper extremity supported Sitting balance-Leahy Scale: Fair   Postural control: Posterior lean Standing balance support: Bilateral upper extremity supported, During functional activity, Reliant on assistive device for balance Standing balance-Leahy Scale: Poor Standing balance comment: initial posterior lean upon standing, reliant on UE and external support                           ADL either performed or assessed with clinical judgement   ADL Overall ADL's : Needs assistance/impaired  Eating/Feeding: Set up;Sitting   Grooming: Set up;Contact guard assist;Sitting   Upper Body Bathing: Set up;Sitting;Contact guard assist   Lower Body  Bathing: Maximal assistance;+2 for safety/equipment;Sit to/from stand   Upper Body Dressing : Set up;Sitting   Lower Body Dressing: Total assistance;Maximal assistance;+2 for physical assistance;+2 for safety/equipment;Sit to/from stand   Toilet Transfer: +2 for physical assistance;+2 for safety/equipment;Moderate assistance;Stand-pivot;Rolling walker (2 wheels)           Functional mobility during ADLs: Moderate assistance;+2 for physical assistance;+2 for safety/equipment;Rolling walker (2 wheels)       Vision Baseline Vision/History: 1 Wears glasses Ability to See in Adequate Light: 0 Adequate Patient Visual Report: No change from baseline Vision Assessment?: Vision impaired- to be further tested in functional context;Yes Eye Alignment: Within Functional Limits Ocular Range of Motion: Within Functional Limits Tracking/Visual Pursuits: Other (comment) (pt returns to midline when tracking to end ranges at eye level on all attempts but then able to re-locate stimulus and track vertically on L and R. was not able to correct despite cues.) Saccades: Additional eye shifts occurred during testing;Decreased speed of saccadic movement;Impaired - to be further tested in functional context;Undershoots Visual Fields: Impaired-to be further tested in functional context (questionable field deficits. Pt with difficulty following commands during testing)     Perception         Praxis         Pertinent Vitals/Pain Pain Assessment Pain Assessment: Faces Faces Pain Scale: Hurts whole lot Pain Location: R hip with movement Pain Descriptors / Indicators: Aching, Discomfort, Grimacing Pain Intervention(s): Limited activity within patient's tolerance, Monitored during session, Premedicated before session     Extremity/Trunk Assessment Upper Extremity Assessment Upper Extremity Assessment: Generalized weakness   Lower Extremity Assessment Lower Extremity Assessment: Defer to PT  evaluation RLE Deficits / Details: Unable to formally MMT due to pain with limited knee and hip ROM. Limited WB in standing RLE: Unable to fully assess due to pain RLE Sensation: WNL LLE Deficits / Details: Grossly 4/5 LLE Sensation: WNL       Communication Communication Communication: Impaired Factors Affecting Communication: Hearing impaired   Cognition Arousal: Alert Behavior During Therapy: WFL for tasks assessed/performed Cognition: Cognition impaired   Orientation impairments: Time, Situation (per brother, pt was found in his home and pt describing his last fall when asked why he is in the hospital) Awareness: Intellectual awareness intact, Online awareness impaired Memory impairment (select all impairments): Short-term memory, Declarative long-term memory Attention impairment (select first level of impairment): Sustained attention, Selective attention Executive functioning impairment (select all impairments): Problem solving, Organization, Sequencing OT - Cognition Comments: pt oriented x2, needing cues for safety, increased time for processing, and repeated commands at times.                 Following commands: Impaired Following commands impaired: Only follows one step commands consistently, Follows multi-step commands with increased time     Cueing  General Comments   Cueing Techniques: Verbal cues;Tactile cues;Visual cues  brother, Merilee present   Exercises     Shoulder Instructions      Home Living Family/patient expects to be discharged to:: Private residence Living Arrangements: Alone Available Help at Discharge: Family;Available 24 hours/day Type of Home: House Home Access: Level entry     Home Layout: One level     Bathroom Shower/Tub: Walk-in shower;Tub/shower unit   Bathroom Toilet: Standard     Home Equipment: Agricultural Consultant (2 wheels);Wheelchair - manual;Cane - single point  Prior Functioning/Environment Prior Level of  Function : Independent/Modified Independent;Driving             Mobility Comments: Ind with no AD, 3 falls due to tripping ADLs Comments: Ind    OT Problem List: Decreased strength;Decreased activity tolerance;Impaired balance (sitting and/or standing);Decreased cognition;Decreased safety awareness;Decreased knowledge of use of DME or AE;Decreased knowledge of precautions;Pain;Impaired vision/perception   OT Treatment/Interventions: Self-care/ADL training;Therapeutic exercise;DME and/or AE instruction;Therapeutic activities;Patient/family education;Balance training;Visual/perceptual remediation/compensation;Cognitive remediation/compensation      OT Goals(Current goals can be found in the care plan section)   Acute Rehab OT Goals Patient Stated Goal: get better OT Goal Formulation: With patient Time For Goal Achievement: 03/21/24 Potential to Achieve Goals: Good   OT Frequency:  Min 2X/week    Co-evaluation PT/OT/SLP Co-Evaluation/Treatment: Yes Reason for Co-Treatment: For patient/therapist safety;To address functional/ADL transfers PT goals addressed during session: Mobility/safety with mobility;Balance;Proper use of DME OT goals addressed during session: ADL's and self-care      AM-PAC OT 6 Clicks Daily Activity     Outcome Measure Help from another person eating meals?: A Little Help from another person taking care of personal grooming?: A Little Help from another person toileting, which includes using toliet, bedpan, or urinal?: Total Help from another person bathing (including washing, rinsing, drying)?: A Lot Help from another person to put on and taking off regular upper body clothing?: A Little Help from another person to put on and taking off regular lower body clothing?: Total 6 Click Score: 13   End of Session Equipment Utilized During Treatment: Gait belt;Rolling walker (2 wheels) Nurse Communication: Mobility status;Need for lift equipment  (stedy)  Activity Tolerance: Patient tolerated treatment well Patient left: in chair;with call bell/phone within reach;with chair alarm set  OT Visit Diagnosis: Unsteadiness on feet (R26.81);Muscle weakness (generalized) (M62.81);History of falling (Z91.81);Other symptoms and signs involving cognitive function;Low vision, both eyes (H54.2)                Time: 9054-8985 OT Time Calculation (min): 29 min Charges:  OT General Charges $OT Visit: 1 Visit OT Evaluation $OT Eval Moderate Complexity: 1 Mod  Elma JONETTA Lebron FREDERICK, OTR/L [facility] Acute Rehabilitation Office: [phone]   Elma JONETTA Lebron 03/07/2024, 1:36 PM

## 2024-03-07 NOTE — Evaluation (Signed)
 Physical Therapy Evaluation Patient Details Name: [name] [name] MRN: [id number] DOB: [birth year] Today's Date: 03/07/2024  History of Present Illness  Pt is a 73 y.o. male presenting 11/1 as code stroke after being found down with laceration to head, confused, agitated, L gaze preference, R weakness. S/p TNK. MRI shows small left occipital infarct. Also found to have R hip fx, s/p ORIF 11/4. PMH:  alcohol abuse, CHF, cirrhosis of the liver, esophageal varices, GERD, OSA, HTN, HLD, sleep apnea, and vocal cord paralysis on the left   Clinical Impression  PTA pt was independent for mobility with no AD with hx of 3 falls that pt attributed to tripping. Pt presents with R hip pain, limited WB on R LE, and impaired balance/gait pattern. Pt required ModAx2 for bed mobility and MaxAx2 to stand with use of RW. Able to then perform step-pivot to the left with ModAx2 to MaxAx2 with use of RW. Increased time and effort with pt taking short steps with heavy UE support. Pt could have 24/7 assist available upon d/c home from family/friends. At this time, recommending >3hrs post acute rehab to maximize rehab potential and improve mobility. Acute PT to follow to address functional impairments listed below.         If plan is discharge home, recommend the following: A lot of help with walking and/or transfers;A lot of help with bathing/dressing/bathroom;Assistance with cooking/housework;Assist for transportation;Help with stairs or ramp for entrance   Can travel by private vehicle   No     Equipment Recommendations BSC/3in1  Recommendations for Other Services  Rehab consult    Functional Status Assessment Patient has had a recent decline in their functional status and demonstrates the ability to make significant improvements in function in a reasonable and predictable amount of time.     Precautions / Restrictions Precautions Precautions: Fall Recall of Precautions/Restrictions: Intact Restrictions Weight  Bearing Restrictions Per Provider Order: Yes RLE Weight Bearing Per Provider Order: Weight bearing as tolerated      Mobility  Bed Mobility Overal bed mobility: Needs Assistance Bed Mobility: Supine to Sit    Supine to sit: Mod assist, +2 for physical assistance    General bed mobility comments: assist to guide B LE's towards EOB with increased time and effort for R LE management. Assist to shift hips using bed pad and to and raise trunk    Transfers Overall transfer level: Needs assistance Equipment used: Rolling walker (2 wheels) Transfers: Sit to/from Stand, Bed to chair/wheelchair/BSC Sit to Stand: Max assist, +2 physical assistance, +2 safety/equipment   Step pivot transfers: Mod assist, Max assist, +2 physical assistance     General transfer comment: MaxAx2 for signifcant boost-up with pt leaning posteriorly. Able to step-pivot towards the left with pt primarily stepping with LLE. Able to take small steps back with both legs with increased time and effort.    Ambulation/Gait  General Gait Details: Unable this date  Modified Rankin (Stroke Patients Only) Modified Rankin (Stroke Patients Only) Pre-Morbid Rankin Score: No symptoms Modified Rankin: Severe disability     Balance Overall balance assessment: Needs assistance, Mild deficits observed, not formally tested, History of Falls Sitting-balance support: Feet supported, No upper extremity supported Sitting balance-Leahy Scale: Fair   Postural control: Posterior lean Standing balance support: Bilateral upper extremity supported, During functional activity, Reliant on assistive device for balance Standing balance-Leahy Scale: Poor Standing balance comment: initial posterior lean upon standing, reliant on UE and external support        Pertinent  Vitals/Pain Pain Assessment Pain Assessment: Faces Faces Pain Scale: Hurts whole lot Pain Location: R hip with movement Pain Descriptors / Indicators: Aching,  Discomfort, Grimacing Pain Intervention(s): Limited activity within patient's tolerance, Monitored during session, Premedicated before session, Repositioned    Home Living Family/patient expects to be discharged to:: Private residence Living Arrangements: Alone Available Help at Discharge: Family;Available 24 hours/day Type of Home: House Home Access: Level entry    Home Layout: One level Home Equipment: Agricultural Consultant (2 wheels);Wheelchair - manual;Cane - single point      Prior Function Prior Level of Function : Independent/Modified Independent;Driving    Mobility Comments: Ind with no AD, 3 falls due to tripping ADLs Comments: Ind     Extremity/Trunk Assessment   Upper Extremity Assessment Upper Extremity Assessment: Defer to OT evaluation    Lower Extremity Assessment Lower Extremity Assessment: RLE deficits/detail;LLE deficits/detail RLE Deficits / Details: Unable to formally MMT due to pain with limited knee and hip ROM. Limited WB in standing RLE: Unable to fully assess due to pain RLE Sensation: WNL LLE Deficits / Details: Grossly 4/5 LLE Sensation: WNL       Communication   Communication Communication: Impaired Factors Affecting Communication: Hearing impaired    Cognition Arousal: Alert Behavior During Therapy: WFL for tasks assessed/performed   PT - Cognitive impairments: Orientation, Awareness, Attention, Initiation, Sequencing, Problem solving, Safety/Judgement    Following commands: Impaired Following commands impaired: Only follows one step commands consistently, Follows multi-step commands with increased time     Cueing Cueing Techniques: Verbal cues, Tactile cues, Visual cues     General Comments General comments (skin integrity, edema, etc.): Friend present and supportive during eval. Dressings dry and intact     PT Assessment Patient needs continued PT services  PT Problem List Decreased strength;Decreased range of motion;Decreased  activity tolerance;Decreased balance;Decreased mobility;Decreased knowledge of use of DME;Pain       PT Treatment Interventions DME instruction;Gait training;Functional mobility training;Therapeutic activities;Therapeutic exercise;Balance training;Neuromuscular re-education;Patient/family education    PT Goals (Current goals can be found in the Care Plan section)  Acute Rehab PT Goals Patient Stated Goal: to go to rehab PT Goal Formulation: With patient Time For Goal Achievement: 03/21/24 Potential to Achieve Goals: Good    Frequency Min 2X/week     Co-evaluation   Reason for Co-Treatment: For patient/therapist safety;To address functional/ADL transfers PT goals addressed during session: Mobility/safety with mobility;Balance;Proper use of DME         AM-PAC PT 6 Clicks Mobility  Outcome Measure Help needed turning from your back to your side while in a flat bed without using bedrails?: A Lot Help needed moving from lying on your back to sitting on the side of a flat bed without using bedrails?: Total Help needed moving to and from a bed to a chair (including a wheelchair)?: Total Help needed standing up from a chair using your arms (e.g., wheelchair or bedside chair)?: Total Help needed to walk in hospital room?: Total Help needed climbing 3-5 steps with a railing? : Total 6 Click Score: 7    End of Session Equipment Utilized During Treatment: Gait belt Activity Tolerance: Patient tolerated treatment well Patient left: in chair;with call bell/phone within reach;with chair alarm set;with family/visitor present Nurse Communication: Mobility status;Need for lift equipment PT Visit Diagnosis: Other abnormalities of gait and mobility (R26.89);Unsteadiness on feet (R26.81);Muscle weakness (generalized) (M62.81);History of falling (Z91.81)    Time: 9054-8985 PT Time Calculation (min) (ACUTE ONLY): 29 min   Charges:   PT  Evaluation $PT Eval Low Complexity: 1 Low   PT  General Charges $$ ACUTE PT VISIT: 1 Visit        Kate ORN, PT, DPT Secure Chat Preferred  Rehab Office [phone]   Kate BRAVO [name] 03/07/2024, 1:07 PM

## 2024-03-07 NOTE — Telephone Encounter (Signed)
 Pharmacy Patient Advocate Encounter  Insurance verification completed.    The patient is insured through [facility]. Patient has Medicare and is not eligible for a copay card, but may be able to apply for patient assistance or Medicare RX Payment Plan (Patient Must reach out to their plan, if eligible for payment plan), if available.    Ran test claim for Xarelto 10mg  and the current 30 day co-pay is $45.00.   This test claim was processed through  [facility]- copay amounts may vary at other pharmacies due to pharmacy/plan contracts, or as the patient moves through the different stages of their insurance plan.

## 2024-03-07 NOTE — Progress Notes (Addendum)
 STROKE TEAM PROGRESS NOTE    SIGNIFICANT HOSPITAL EVENTS 11/1 presented with confusion, aphasia and right side weakness. Received IV TNK @ 1630   INTERIM HISTORY/SUBJECTIVE No family at the bedside.  Patient is sitting up in bed eating breakfast in no apparent distress. He underwent right hip fracture repair last night, on Xarelto for DVT prophylaxis Neurological exam remains stable and unchanged  CBC    Component Value Date/Time   WBC 14.8 (H) 03/07/2024 0901   RBC 2.27 (L) 03/07/2024 0901   HGB 7.3 (L) 03/07/2024 0901   HCT 22.6 (L) 03/07/2024 0901   PLT 225 03/07/2024 0901   MCV 99.6 03/07/2024 0901   MCH 32.2 03/07/2024 0901   MCHC 32.3 03/07/2024 0901   RDW 13.9 03/07/2024 0901   LYMPHSABS 1.0 03/03/2024 1546   MONOABS 1.1 (H) 03/03/2024 1546   EOSABS 0.0 03/03/2024 1546   BASOSABS 0.1 03/03/2024 1546    BMET    Component Value Date/Time   NA 135 03/04/2024 1503   NA 137 10/07/2023 1311   K 3.3 (L) 03/04/2024 1503   CL 104 03/04/2024 1503   CO2 15 (L) 03/04/2024 1503   GLUCOSE 153 (H) 03/04/2024 1503   BUN 32 (H) 03/04/2024 1503   BUN 22 10/07/2023 1311   CREATININE 1.92 (H) 03/04/2024 1503   CALCIUM 8.4 (L) 03/04/2024 1503   EGFR 30.0 01/20/2024 0936   EGFR 69 10/07/2023 1311   GFRNONAA 36 (L) 03/04/2024 1503    IMAGING past 24 hours DG FEMUR, MIN 2 VIEWS RIGHT Result Date: 03/06/2024 EXAM: 2 VIEW(S) XRAY OF THE RIGHT FEMUR 03/06/2024 07:48:43 PM COMPARISON: 03/03/2024 CLINICAL HISTORY: [id number] Post-operative state 747648 Post-operative state FINDINGS: BONES AND JOINTS: Internal fixation is present across the right femoral intertrochanteric fracture with near anatomic alignment. No hardware complicating feature. No joint dislocation. SOFT TISSUES: The soft tissues are unremarkable. IMPRESSION: 1. Internal fixation across the right femoral intertrochanteric fracture with near anatomic alignment and no hardware complication. Electronically signed by: Franky Crease MD  03/06/2024 09:29 PM EST RP Workstation: [id number]   DG FEMUR, MIN 2 VIEWS RIGHT Result Date: 03/06/2024 EXAM: Multiple VIEW(S) XRAY OF THE RIGHT FEMUR 03/06/2024 06:30:00 PM COMPARISON: None available. CLINICAL HISTORY: Surgery, elective [id number]. FINDINGS: BONES AND JOINTS: Multiple intraoperative spot images demonstrate placement of an intramedullary nail and dynamic screw across the right femoral intertrochanteric fracture. Anatomic alignment. No hardware complications. SOFT TISSUES: The soft tissues are unremarkable. IMPRESSION: 1. Placement of intramedullary nail and dynamic screw across the right femoral intertrochanteric fracture with anatomic alignment and no hardware complications. Electronically signed by: Franky Crease MD 03/06/2024 09:28 PM EST RP Workstation: [id number]   DG C-Arm 1-60 Min-No Report Result Date: 03/06/2024 Fluoroscopy was utilized by the requesting physician.  No radiographic interpretation.   DG C-Arm 1-60 Min-No Report Result Date: 03/06/2024 Fluoroscopy was utilized by the requesting physician.  No radiographic interpretation.    Vitals:   03/07/24 0808 03/07/24 0823 03/07/24 0900 03/07/24 1000  BP: (!) 158/61     Pulse: (!) 54     Resp: (!) 21 14 (!) 24 19  Temp: 99.5 F (37.5 C)     TempSrc: Oral     SpO2: 100%     Weight:      Height:         PHYSICAL EXAM General:  Alert, well-nourished, well-developed patient in no acute distress Psych:  Mood and affect appropriate for situation CV: Regular rate and rhythm on monitor Respiratory:  Regular, unlabored respirations on  room air GI: Abdomen soft and nontender     NEURO:  Mental Status: Awake and alert, confused, able to follow commands.  Very hard of hearing.  Some mild aphasia   Cranial Nerves:  II: PERRL. Visual fields full.  III, IV, VI: EOMI. Eyelids elevate symmetrically.  V: Sensation is intact to light touch and symmetrical to face.  VII: Face is symmetrical resting and smiling VIII: Very  hard of hearing IX, X: Palate elevates symmetrically. Phonation is normal.  KP:Dynloizm shrug 5/5. XII: tongue is midline without fasciculations. Motor: Moves bilateral uppers and left leg spontaneous and antigravity, right leg minimal movement due to right femur fracture can wiggle toes Tone: is normal and bulk is normal Sensation- Intact to light touch bilaterally. Extinction absent to light touch to DSS.   Coordination: FTN intact bilaterally, HKS: no ataxia in BLE.No drift.  Gait- deferred   Most Recent NIH 8     ASSESSMENT/PLAN   [name] is a 73 y.o. 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  Per EMS will check was called and EMS found him on the floor with a laceration on his head, noted to be confused agitated, with left gaze preference and hypertensive at 240/110. NIH on Admission 18   Strokelike episode: Presenting with confusion aphasia and left gaze deviation.  s/p TNK with fall and right hip femur fracture.  MRI shows small left occipital infarct which does not explain his presenting symptoms Etiology: Likely cardioembolic Code Stroke  CT head No acute abnormality. ASPECTS 10.  Old left frontal infarct and chronic ischemic white matter changes  CTA head & neck No LVO Atherosclerotic disease at both carotid bifurcations without hemodynamically significant stenosis  MRI small left superior occipital cortical infarct 2D Echo EF > 75%.  Mild LVH with grade 1 diastolic dysfunction LDL 127 HgbA1c 5.3 VTE prophylaxis -on Xarelto No antithrombotic prior to admission, now on Xarelto Therapy recommendations:  Pending Disposition: Pending   Hx of Stroke/TIA Old left frontal infarct on brain imaging   S/p unwitnessed fall Acute right femur fracture Pelvis x-ray Right femoral  intertrochanteric fracture with displacement and varus angulation. CT chest abdomen pelvis:  1. Moderately displaced and comminuted intertrochanteric fracture of the proximal right femur, acute. 2. Old bilateral rib fractures, chronic. 3. Chronic L1 vertebral body compression fracture, chronic. 4. Status post appendectomy. 5. Aortic atherosclerosis. 6. Coronary artery calcifications. 7. Diverticulosis of the descending and sigmoid colon without evidence of inflammation. 8. Possible bilateral nephrolithiasis. Ortho consulted Right hip fracture repair on 11//2025   Hypertension CHF Home meds: Amlodipine  5 mg, propranolol  20 mg Stable Home meds on hold currently Blood Pressure Goal: BP less than 180/105    Hyperlipidemia Home meds: Zetia  10 mg,  resumed in hospital LDL 127, goal < 70 Continue statin at discharge   Substance Abuse History of EtOH abuse History of liver cirrhosis, esophageal varices UDS positive ordered ETOH use, alcohol level <15, advised to drink no more than 2 drink(s) a day TOC consult for cessation placed   Dysphagia Patient has post-stroke dysphagia, SLP consulted       Diet    Diet NPO time specified        Advance diet as tolerated   Other Stroke Risk Factors Obesity, Body mass index is 31.95 kg/m.,  BMI >/= 30 associated with increased stroke risk, recommend weight loss, diet and exercise as appropriate  Coronary artery disease Congestive heart failure Obstructive sleep apnea   Hospital day # 4  Karna Geralds DNP, ACNPC-AG  [facility]  I have personally obtained history,examined this patient, reviewed notes, independently viewed imaging studies, participated in medical decision making and plan of care.ROS completed by me personally and pertinent positives fully documented  I have made any additions or clarifications directly to the above note. Agree with note above.  Patient had hip surgery last night.  He has been started on  Xarelto per orthopedics.  Discontinue antiplatelet therapy while he is on Xarelto.  Mobilize out of bed.  Physical Occupational Therapy consult.  He will likely need inpatient rehab.  No family at the bedside.  I personally spent a total of  35 minutes in the care of the patient today including getting/reviewing separately obtained history, performing a medically appropriate exam/evaluation, counseling and educating, placing orders, referring and communicating with other health care professionals, documenting clinical information in the EHR, independently interpreting results, and coordinating care.         Eather Popp, MD Medical Director [facility] Pager: [phone] 03/07/2024 2:28 PM  To contact Stroke Continuity provider, please refer to [email or web address]. After hours, contact General Neurology

## 2024-03-07 NOTE — Progress Notes (Signed)
 Speech Language Pathology Treatment: Dysphagia;Cognitive-Linguistic  Patient Details Name: [name] [name] MRN: [id number] DOB: 1951/04/01 Today's Date: 03/07/2024 Time: 1320-1340 SLP Time Calculation (min) (ACUTE ONLY): 20 min  Assessment / Plan / Recommendation Clinical Impression  Dysphagia:  Prior ST recommeded D1/thin liquids due to mildly prolonged mastication of regular solids with an immediate throat clear and delayed cough with mild diffuse oral residue and impulsivity on 11/3, however patient then made NPO for procedure. Upon return from procedure, patient placed on regular/thin diet by medical team. Upon entrance, patient consuming regular/thin liquid lunch tray. Patient with timely mastication and complete oral clearance. Noted occasional delayed throat clears which may be indicative of bolus misdirection, however patient reports this is baseline and describes feeling like food gets stuck in his throat. SLP educated patient in detail option to persue MBS to objectively assess oropharyngeal swallow and check for penetration/aspiration and/or residuals causing globus sensation. Patient repeatedly reports he is too old to mess with that kind of stuff. At this time, recommend continuation of regular/thin diet. Nursing reports no concerns re: PO intake.   Language:  SLP targeted communication goals through expressive/receptive language tasks. Patient spoke at the conversational level sharing information re: self, job and medical hx. Expressively, patient with 100% accuracy during confrontational and responsive naming task and no overt word finding difficulty during conversation. Receptively, patient with 90% accuracy during simple yes/no questions and 100% accuracy during complex. Patient with seemingly improved language skills compared to evaluation.   HPI HPI: [name] [name] is a 73 y.o. male with hx of alcohol abuse, CHF, cirrhosis of the liver, esophageal varices, GERD, sleep apnea,  hypertension, hyperlipidemia, sleep apnea, and vocal cord paralysis on the left who presents via EMS to the ED as a code stroke.  TNK administered. 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      SLP Plan  Continue with current plan of care      Recommendations  Diet recommendations: Regular;Thin liquid Liquids provided via: Cup;Straw Medication Administration: Whole meds with liquid Supervision: Patient able to self feed;Intermittent supervision to cue for compensatory strategies Compensations: Minimize environmental distractions;Slow rate;Small sips/bites Postural Changes and/or Swallow Maneuvers: Seated upright 90 degrees;Upright 30-60 min after meal        Oral care BID   Frequent or constant Supervision/Assistance Dysphagia, unspecified (R13.10);Cognitive communication deficit (R41.841)     Continue with current plan of care     Deseri Loss M.A., CCC-SLP 03/07/2024, 1:58 PM

## 2024-03-08 ENCOUNTER — Encounter (HOSPITAL_COMMUNITY): Payer: Self-pay | Admitting: Orthopedic Surgery

## 2024-03-08 DIAGNOSIS — I498 Other specified cardiac arrhythmias: Secondary | ICD-10-CM

## 2024-03-08 LAB — BASIC METABOLIC PANEL WITH GFR
Anion gap: 11 (ref 5–15)
BUN: 32 mg/dL — ABNORMAL HIGH (ref 8–23)
CO2: 19 mmol/L — ABNORMAL LOW (ref 22–32)
Calcium: 7.9 mg/dL — ABNORMAL LOW (ref 8.9–10.3)
Chloride: 106 mmol/L (ref 98–111)
Creatinine, Ser: 1.36 mg/dL — ABNORMAL HIGH (ref 0.61–1.24)
GFR, Estimated: 55 mL/min — ABNORMAL LOW (ref >60)
Glucose, Bld: 112 mg/dL — ABNORMAL HIGH (ref 70–99)
Potassium: 4.2 mmol/L (ref 3.5–5.1)
Sodium: 136 mmol/L (ref 135–145)

## 2024-03-08 LAB — CBC
HCT: 22.4 % — ABNORMAL LOW (ref 39.0–52.0)
HCT: 24.2 % — ABNORMAL LOW (ref 39.0–52.0)
Hemoglobin: 7.3 g/dL — ABNORMAL LOW (ref 13.0–17.0)
Hemoglobin: 7.7 g/dL — ABNORMAL LOW (ref 13.0–17.0)
MCH: 32.3 pg (ref 26.0–34.0)
MCH: 32 pg (ref 26.0–34.0)
MCHC: 32.6 g/dL (ref 30.0–36.0)
MCHC: 31.8 g/dL (ref 30.0–36.0)
MCV: 99.1 fL (ref 80.0–100.0)
MCV: 100.4 fL — ABNORMAL HIGH (ref 80.0–100.0)
Platelets: 199 K/uL (ref 150–400)
Platelets: 235 K/uL (ref 150–400)
RBC: 2.26 MIL/uL — ABNORMAL LOW (ref 4.22–5.81)
RBC: 2.41 MIL/uL — ABNORMAL LOW (ref 4.22–5.81)
RDW: 14 % (ref 11.5–15.5)
RDW: 13.8 % (ref 11.5–15.5)
WBC: 11.2 K/uL — ABNORMAL HIGH (ref 4.0–10.5)
WBC: 12.9 K/uL — ABNORMAL HIGH (ref 4.0–10.5)
nRBC: 0 % (ref 0.0–0.2)
nRBC: 0 % (ref 0.0–0.2)

## 2024-03-08 LAB — PHOSPHORUS: Phosphorus: 3.1 mg/dL (ref 2.5–4.6)

## 2024-03-08 LAB — GLUCOSE, CAPILLARY: Glucose-Capillary: 124 mg/dL — ABNORMAL HIGH (ref 70–99)

## 2024-03-08 LAB — MAGNESIUM: Magnesium: 2.4 mg/dL (ref 1.7–2.4)

## 2024-03-08 MED ORDER — SODIUM CHLORIDE 0.9 % IV SOLN: INTRAVENOUS | Status: AC

## 2024-03-08 MED ORDER — HYDROCODONE-ACETAMINOPHEN 7.5-325 MG PO TABS
1.0000 | ORAL_TABLET | ORAL | Status: DC | PRN
  Administered 2024-03-08 – 2024-03-14 (×8): 1 via ORAL
  Filled 2024-03-08 (×8): qty 1

## 2024-03-08 MED ORDER — OXYCODONE HCL 5 MG PO TABS
10.0000 mg | ORAL_TABLET | ORAL | Status: DC | PRN
  Administered 2024-03-10 – 2024-03-14 (×12): 10 mg via ORAL
  Filled 2024-03-08 (×13): qty 2

## 2024-03-08 MED ORDER — HYDRALAZINE HCL 20 MG/ML IJ SOLN
10.0000 mg | INTRAMUSCULAR | Status: DC | PRN
  Administered 2024-03-08 – 2024-03-09 (×2): 10 mg via INTRAVENOUS
  Filled 2024-03-08 (×2): qty 1

## 2024-03-08 MED ORDER — POLYETHYLENE GLYCOL 3350 17 G PO PACK
17.0000 g | PACK | Freq: Every day | ORAL | Status: DC
  Administered 2024-03-08 – 2024-03-14 (×7): 17 g via ORAL
  Filled 2024-03-08 (×7): qty 1

## 2024-03-08 NOTE — Progress Notes (Signed)
 Speech Language Pathology Treatment: Dysphagia;Cognitive-Linguistic  Patient Details Name: [name] [name] MRN: [id number] DOB: December 27, 1950 Today's Date: 03/08/2024 Time: 9162-9153 SLP Time Calculation (min) (ACUTE ONLY): 9 min  Assessment / Plan / Recommendation Clinical Impression  Pt seen for follow up for dysphagia and language. His language has significantly improved from eval and appears to be near baseline which pt agrees with. He fluently conversed without paraphasias and described events of his day prior to hospital as well as verbalizing steps in simple activity without difficulty. He followed 3 step command to 100%. Pt has met goals for communication and recommend further assessment of cognitive abilities in diagnostic treatment and make recommendations accordingly.  His swallow function has improved as he had no s/s aspiration when challenged with multiple, consecutive sips thin via straw and denies having difficulty. Timely and efficient mastication with solid. He was noted to throat clear yesterday and states he does that often that may be related to his GERD. Educated re: esophageal strategies. He has met goals for swallow.    HPI HPI: [name] COLPITTS is a 73 y.o. male with hx of alcohol abuse, CHF, cirrhosis of the liver, esophageal varices, GERD, sleep apnea, hypertension, hyperlipidemia, sleep apnea, and vocal cord paralysis on the left who presents via EMS to the ED as a code stroke.  TNK administered. 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      SLP Plan  Continue with current plan of care (met swallow goals)          Recommendations  Diet recommendations: Regular;Thin liquid Liquids provided via: Cup;Straw Medication Administration: Whole meds with liquid Supervision: Patient able to self  feed Compensations: Slow rate;Small sips/bites Postural Changes and/or Swallow Maneuvers: Seated upright 90 degrees;Upright 30-60 min after meal                  Oral care BID   Intermittent Supervision/Assistance Cognitive communication deficit (R41.841);Dysphagia, unspecified (R13.10)     Continue with current plan of care (met swallow goals)     [name]  03/08/2024, 8:51 AM

## 2024-03-08 NOTE — Progress Notes (Signed)
 Inpatient Rehab Admissions Coordinator Note:   Per therapy recommendations patient was screened for CIR candidacy by Reche FORBES Lowers, PT. At this time, pt appears to be a potential candidate for CIR. I will place an order for rehab consult for full assessment, per our protocol.  Please contact me any with questions.SABRA Reche Lowers, PT, DPT [phone] 03/08/24 2:08 PM

## 2024-03-08 NOTE — Progress Notes (Signed)
 Physical Therapy Treatment Patient Details Name: [name] [name] MRN: [id number] DOB: 07-11-1950 Today's Date: 03/08/2024   History of Present Illness Pt is a 73 y.o. male presenting 11/1 as code stroke after being found down with laceration to head, confused, agitated, L gaze preference, R weakness. S/p TNK. MRI shows small left occipital infarct. Also found to have R hip fx, s/p ORIF 11/4. PMH:  alcohol abuse, CHF, cirrhosis of the liver, esophageal varices, GERD, OSA, HTN, HLD, sleep apnea, and vocal cord paralysis on the left    PT Comments  Pt received in supine and agreeable to PT session. Pt continues to required ModAx2 for bed mobility with assist needed to guide R LE towards EOB. Increased time and effort during mobility due to high levels of R LE pain. Able to stand with MaxAx2 and step pivot to the left with ModAx2 and RW. Assist needed to manage RW and for steadying assistance. After a seated rest break, pt was then able to ambulate 55ft with ModAx2 for chair follow. Continue to recommend >3hrs post acute rehab with acute PT to follow.    If plan is discharge home, recommend the following: A lot of help with walking and/or transfers;A lot of help with bathing/dressing/bathroom;Assistance with cooking/housework;Assist for transportation;Help with stairs or ramp for entrance   Can travel by private vehicle      No  Equipment Recommendations  BSC/3in1       Precautions / Restrictions Precautions Precautions: Fall Recall of Precautions/Restrictions: Impaired Restrictions Weight Bearing Restrictions Per Provider Order: Yes RLE Weight Bearing Per Provider Order: Weight bearing as tolerated     Mobility  Bed Mobility Overal bed mobility: Needs Assistance Bed Mobility: Supine to Sit    Supine to sit: Mod assist, +2 for physical assistance    General bed mobility comments: assist to guide B LE's towards EOB with increased time and effort for R LE management. Assist to shift hips  using bed pad and to and raise trunk    Transfers Overall transfer level: Needs assistance Equipment used: Rolling walker (2 wheels) Transfers: Sit to/from Stand, Bed to chair/wheelchair/BSC Sit to Stand: Max assist, +2 physical assistance, +2 safety/equipment   Step pivot transfers: Mod assist, +2 physical assistance, +2 safety/equipment    General transfer comment: MaxAx2 for boost-up with use of gait belt. Cues for hand placement. Able to step-pivot to the left with slight R LE instability. ModAx2 for balance and to manage RW when turning    Ambulation/Gait Ambulation/Gait assistance: Mod assist, +2 physical assistance, +2 safety/equipment Gait Distance (Feet): 4 Feet Assistive device: Rolling walker (2 wheels) Gait Pattern/deviations: Step-to pattern, Decreased step length - right, Decreased stance time - right Gait velocity: decr     General Gait Details: ModA for steadying assist and to manage RW with +2 for chair follow. Heavy reliance on UE support to off-load R LE.     Modified Rankin (Stroke Patients Only) Modified Rankin (Stroke Patients Only) Pre-Morbid Rankin Score: No symptoms Modified Rankin: Moderately severe disability     Balance Overall balance assessment: Needs assistance, Mild deficits observed, not formally tested, History of Falls Sitting-balance support: Feet supported, No upper extremity supported Sitting balance-Leahy Scale: Fair     Standing balance support: Bilateral upper extremity supported, During functional activity, Reliant on assistive device for balance Standing balance-Leahy Scale: Poor Standing balance comment: reliant on UE and external support     Communication Communication Communication: Impaired Factors Affecting Communication: Hearing impaired  Cognition Arousal: Alert Behavior During  Therapy: WFL for tasks assessed/performed   PT - Cognitive impairments: Awareness, Attention, Sequencing, Problem solving, Safety/Judgement,  Memory, Orientation   Orientation impairments: Situation    PT - Cognition Comments: Pt asking why his R LE was so sore with pt not remembering having a hip fx. Difficulty following commands with increased time needed to process Following commands: Impaired Following commands impaired: Follows one step commands with increased time, Follows multi-step commands inconsistently    Cueing Cueing Techniques: Verbal cues, Tactile cues, Visual cues         Pertinent Vitals/Pain Pain Assessment Pain Assessment: Faces Faces Pain Scale: Hurts whole lot Pain Location: R hip with movement Pain Descriptors / Indicators: Aching, Discomfort, Grimacing, Crying Pain Intervention(s): Limited activity within patient's tolerance, Monitored during session, Premedicated before session, Repositioned     PT Goals (current goals can now be found in the care plan section) Acute Rehab PT Goals PT Goal Formulation: With patient Time For Goal Achievement: 03/21/24 Potential to Achieve Goals: Good Progress towards PT goals: Progressing toward goals    Frequency    Min 2X/week       AM-PAC PT 6 Clicks Mobility   Outcome Measure  Help needed turning from your back to your side while in a flat bed without using bedrails?: A Lot Help needed moving from lying on your back to sitting on the side of a flat bed without using bedrails?: Total Help needed moving to and from a bed to a chair (including a wheelchair)?: Total Help needed standing up from a chair using your arms (e.g., wheelchair or bedside chair)?: Total Help needed to walk in hospital room?: Total Help needed climbing 3-5 steps with a railing? : Total 6 Click Score: 7    End of Session Equipment Utilized During Treatment: Gait belt Activity Tolerance: Patient tolerated treatment well Patient left: in chair;with call bell/phone within reach;with chair alarm set Nurse Communication: Mobility status;Need for lift equipment PT Visit  Diagnosis: Other abnormalities of gait and mobility (R26.89);Unsteadiness on feet (R26.81);Muscle weakness (generalized) (M62.81);History of falling (Z91.81)     Time: 8942-8872 PT Time Calculation (min) (ACUTE ONLY): 30 min  Charges:    $Gait Training: 8-22 mins $Therapeutic Activity: 8-22 mins PT General Charges $$ ACUTE PT VISIT: 1 Visit                    Kate ORN, PT, DPT Secure Chat Preferred  Rehab Office [phone]   Kate BRAVO [name] 03/08/2024, 1:48 PM

## 2024-03-08 NOTE — Progress Notes (Addendum)
 STROKE TEAM PROGRESS NOTE    SIGNIFICANT HOSPITAL EVENTS 11/1 presented with confusion, aphasia and right side weakness. Received IV TNK @ 1630   INTERIM HISTORY/SUBJECTIVE No family at the bedside.  Patient is eating breakfast in bed in NAD.   CBC    Component Value Date/Time   WBC 12.9 (H) 03/08/2024 0820   RBC 2.41 (L) 03/08/2024 0820   HGB 7.7 (L) 03/08/2024 0820   HCT 24.2 (L) 03/08/2024 0820   PLT 235 03/08/2024 0820   MCV 100.4 (H) 03/08/2024 0820   MCH 32.0 03/08/2024 0820   MCHC 31.8 03/08/2024 0820   RDW 13.8 03/08/2024 0820   LYMPHSABS 1.0 03/03/2024 1546   MONOABS 1.1 (H) 03/03/2024 1546   EOSABS 0.0 03/03/2024 1546   BASOSABS 0.1 03/03/2024 1546    BMET    Component Value Date/Time   NA 136 03/08/2024 0820   NA 137 10/07/2023 1311   K 4.2 03/08/2024 0820   CL 106 03/08/2024 0820   CO2 19 (L) 03/08/2024 0820   GLUCOSE 112 (H) 03/08/2024 0820   BUN 32 (H) 03/08/2024 0820   BUN 22 10/07/2023 1311   CREATININE 1.36 (H) 03/08/2024 0820   CALCIUM 7.9 (L) 03/08/2024 0820   EGFR 30.0 01/20/2024 0936   EGFR 69 10/07/2023 1311   GFRNONAA 55 (L) 03/08/2024 0820    IMAGING past 24 hours No results found.   Vitals:   03/08/24 0930 03/08/24 0934 03/08/24 1000 03/08/24 1100  BP:  (!) 156/53    Pulse:      Resp: 18 18 (!) 23 19  Temp:      TempSrc:      SpO2:      Weight:      Height:         PHYSICAL EXAM General:  Alert, well-nourished, well-developed patient in no acute distress Psych:  Mood and affect appropriate for situation CV: Regular rate and rhythm on monitor Respiratory:  Regular, unlabored respirations on room air GI: Abdomen soft and nontender     NEURO:  Mental Status: Awake and alert, confused, able to follow commands.  Very hard of hearing.  Some mild aphasia   Cranial Nerves:  II: PERRL. Visual fields full.  III, IV, VI: EOMI. Eyelids elevate symmetrically.  V: Sensation is intact to light touch and symmetrical to face.  VII:  Face is symmetrical resting and smiling VIII: Very hard of hearing IX, X: Palate elevates symmetrically. Phonation is normal.  KP:Dynloizm shrug 5/5. XII: tongue is midline without fasciculations. Motor: Moves bilateral uppers and left leg spontaneous and antigravity, right leg minimal movement due to right femur fracture can wiggle toes Tone: is normal and bulk is normal Sensation- Intact to light touch bilaterally. Extinction absent to light touch to DSS.   Coordination: FTN intact bilaterally, HKS: no ataxia in BLE.No drift.  Gait- deferred   Most Recent NIH 8     ASSESSMENT/PLAN   Mr. DRACEN REIGLE is a 73 y.o. 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  Per EMS will check was called and EMS found him on the floor with a laceration on his head, noted to be confused agitated, with left gaze preference and hypertensive at 240/110. NIH on Admission 18   Strokelike episode: Presenting with confusion aphasia and left gaze deviation.  s/p TNK with fall and right hip femur fracture.  MRI shows small left occipital infarct which does not explain his presenting symptoms Etiology: Likely cardioembolic Code Stroke  CT head No acute abnormality. ASPECTS 10.  Old left frontal infarct and chronic ischemic white matter changes  CTA head & neck No LVO Atherosclerotic disease at both carotid bifurcations without hemodynamically significant stenosis  MRI small left superior occipital cortical infarct 2D Echo EF > 75%.  Mild LVH with grade 1 diastolic dysfunction LDL 127 HgbA1c 5.3 VTE prophylaxis -on Xarelto No antithrombotic prior to admission, now on Xarelto Therapy recommendations:  CIR Disposition: Pending   Hx of Stroke/TIA Old left frontal infarct on brain imaging   S/p unwitnessed fall Acute  right femur fracture Pelvis x-ray Right femoral intertrochanteric fracture with displacement and varus angulation. CT chest abdomen pelvis:  1. Moderately displaced and comminuted intertrochanteric fracture of the proximal right femur, acute. 2. Old bilateral rib fractures, chronic. 3. Chronic L1 vertebral body compression fracture, chronic. 4. Status post appendectomy. 5. Aortic atherosclerosis. 6. Coronary artery calcifications. 7. Diverticulosis of the descending and sigmoid colon without evidence of inflammation. 8. Possible bilateral nephrolithiasis. Ortho consulted Right hip fracture repair on 11//2025   Hypertension CHF Home meds: Amlodipine  5 mg, propranolol  20 mg Stable On amio and bisoprolol  Blood Pressure Goal: BP less than 180/105    Hyperlipidemia Home meds: Zetia  10 mg,  resumed in hospital LDL 127, goal < 70 Atorvastatin 40 mg added  Continue statin at discharge   Substance Abuse History of EtOH abuse History of liver cirrhosis, esophageal varices UDS positive ordered ETOH use, alcohol level <15, advised to drink no more than 2 drink(s) a day TOC consult for cessation placed   Dysphagia Patient has post-stroke dysphagia, SLP consulted       Diet    Diet NPO time specified        Advance diet as tolerated   AKI  Cr 1.36 - will give a fluid  today and recheck in the morning   Other Stroke Risk Factors Obesity, Body mass index is 31.95 kg/m., BMI >/= 30 associated with increased stroke risk, recommend weight loss, diet and exercise as appropriate  Coronary artery disease Congestive heart failure Obstructive sleep apnea   Hospital day # 5  Karna Geralds DNP, ACNPC-AG  [facility] I have personally obtained history,examined this patient, reviewed notes, independently viewed imaging studies, participated in medical decision making and plan of care.ROS completed by me personally and pertinent positives fully documented  I have made any  additions or clarifications directly to the above note. Agree with note above.  Then we will check CBC and BMP r today.  Continue physical Occupational Therapy.     Eather Popp, MD Medical Director [facility] Pager: [phone] 03/08/2024 12:47 PM  To contact Stroke Continuity provider, please refer to [email or web address]. After hours, contact General Neurology

## 2024-03-08 NOTE — Progress Notes (Signed)
 Cardiologist:  Santo  Subjective:  Denies SSCP, palpitations or Dyspnea Was OOB to chair yesterday  Objective:  Vitals:   03/08/24 0741 03/08/24 0744 03/08/24 0800 03/08/24 0803  BP: (!) 186/64 (!) 189/59 (!) 182/58 (!) 182/65  Pulse: 61     Resp: (!) 24 18 (!) 27 18  Temp:  98.6 F (37 C)    TempSrc:  Oral    SpO2: 100%     Weight:      Height:        Intake/Output from previous day:  Intake/Output Summary (Last 24 hours) at 03/08/2024 0820 Last data filed at 03/08/2024 0744 Gross per 24 hour  Intake 1040 ml  Output 1450 ml  Net -410 ml    Physical Exam:  Alert   AS murmur Lungs clear Abdomen benign Right femur fracture post op day one  Plus one edema  Lab Results: Basic Metabolic Panel: No results for input(s): NA, K, CL, CO2, GLUCOSE, BUN, CREATININE, CALCIUM, MG, PHOS in the last 72 hours.   CBC: Recent Labs    03/07/24 0901 03/08/24 0219  WBC 14.8* 11.2*  HGB 7.3* 7.3*  HCT 22.6* 22.4*  MCV 99.6 99.1  PLT 225 199     Imaging: DG FEMUR, MIN 2 VIEWS RIGHT Result Date: 03/06/2024 EXAM: 2 VIEW(S) XRAY OF THE RIGHT FEMUR 03/06/2024 07:48:43 PM COMPARISON: 03/03/2024 CLINICAL HISTORY: 747648 Post-operative state 747648 Post-operative state FINDINGS: BONES AND JOINTS: Internal fixation is present across the right femoral intertrochanteric fracture with near anatomic alignment. No hardware complicating feature. No joint dislocation. SOFT TISSUES: The soft tissues are unremarkable. IMPRESSION: 1. Internal fixation across the right femoral intertrochanteric fracture with near anatomic alignment and no hardware complication. Electronically signed by: Franky Crease MD 03/06/2024 09:29 PM EST RP Workstation: [id number]   DG FEMUR, MIN 2 VIEWS RIGHT Result Date: 03/06/2024 EXAM: Multiple VIEW(S) XRAY OF THE RIGHT FEMUR 03/06/2024 06:30:00 PM COMPARISON: None available. CLINICAL HISTORY: Surgery, elective [id number]. FINDINGS: BONES AND  JOINTS: Multiple intraoperative spot images demonstrate placement of an intramedullary nail and dynamic screw across the right femoral intertrochanteric fracture. Anatomic alignment. No hardware complications. SOFT TISSUES: The soft tissues are unremarkable. IMPRESSION: 1. Placement of intramedullary nail and dynamic screw across the right femoral intertrochanteric fracture with anatomic alignment and no hardware complications. Electronically signed by: Franky Crease MD 03/06/2024 09:28 PM EST RP Workstation: [id number]   DG C-Arm 1-60 Min-No Report Result Date: 03/06/2024 Fluoroscopy was utilized by the requesting physician.  No radiographic interpretation.   DG C-Arm 1-60 Min-No Report Result Date: 03/06/2024 Fluoroscopy was utilized by the requesting physician.  No radiographic interpretation.    Cardiac Studies:  ECG: ST vs atrial tachycardia    Telemetry:  SR  rates 60's PVC NSVT 3 beats   Echo: EF > 75% read as no AS but wrong mean gradient 3.7 peak 26.6 AVA 1.2 cm2 moderate AS   Medications:    amiodarone  200 mg Oral BID   atorvastatin  40 mg Oral Daily   bisoprolol  10 mg Oral Daily   Chlorhexidine  Gluconate Cloth  6 each Topical Daily   ezetimibe   10 mg Oral Daily   pantoprazole   40 mg Oral QHS   QUEtiapine   25 mg Oral QHS   rivaroxaban  10 mg Oral Daily        Assessment/Plan:   Arrhythmia. Has a long list of allergies with no specific descriptions. Dr Creta notes indicate non compliance. Will try Zebeta for rate control 10  mg. He has not been tried on this before. ( Lopressor /Atenolol ) More atrial tachycardia than flutter not a good long term candidate for anticoagulation with ETOH abuse  Rhythm much better sinus in 60's on bisoprolol and low dose amiodarone.  Femur fracture:  Right- post op day one. Cardiac status stable DVT prophylaxis with xarelto 10 mg daily  AS moderate stable EF > 75% outpatient f/u [facility]  Cardiology will sign off   [name] [name] 03/08/2024,  8:20 AM

## 2024-03-09 DIAGNOSIS — R29703 NIHSS score 3: Secondary | ICD-10-CM

## 2024-03-09 LAB — BASIC METABOLIC PANEL WITH GFR
Anion gap: 9 (ref 5–15)
BUN: 25 mg/dL — ABNORMAL HIGH (ref 8–23)
CO2: 20 mmol/L — ABNORMAL LOW (ref 22–32)
Calcium: 7.4 mg/dL — ABNORMAL LOW (ref 8.9–10.3)
Chloride: 108 mmol/L (ref 98–111)
Creatinine, Ser: 1.29 mg/dL — ABNORMAL HIGH (ref 0.61–1.24)
GFR, Estimated: 58 mL/min — ABNORMAL LOW (ref >60)
Glucose, Bld: 126 mg/dL — ABNORMAL HIGH (ref 70–99)
Potassium: 4.3 mmol/L (ref 3.5–5.1)
Sodium: 137 mmol/L (ref 135–145)

## 2024-03-09 NOTE — Plan of Care (Signed)
   Problem: Education: Goal: Knowledge of disease or condition will improve Outcome: Progressing Goal: Knowledge of secondary prevention will improve (MUST DOCUMENT ALL) Outcome: Progressing Goal: Knowledge of patient specific risk factors will improve (DELETE if not current risk factor) Outcome: Progressing

## 2024-03-09 NOTE — Plan of Care (Signed)
  Problem: Education: Goal: Knowledge of disease or condition will improve 03/09/2024 2346 by Kyona Chauncey K, RN Outcome: Progressing 03/09/2024 2331 by Tianni Escamilla K, RN Outcome: Progressing Goal: Knowledge of secondary prevention will improve (MUST DOCUMENT ALL) 03/09/2024 2346 by Demarkus Remmel K, RN Outcome: Progressing 03/09/2024 2331 by Andy Moye K, RN Outcome: Progressing Goal: Knowledge of patient specific risk factors will improve (DELETE if not current risk factor) 03/09/2024 2346 by Thang Flett K, RN Outcome: Progressing 03/09/2024 2331 by Treasa Bradshaw K, RN Outcome: Progressing

## 2024-03-09 NOTE — TOC Initial Note (Signed)
 Transition of Care [facility]) - Initial/Assessment Note    Patient Details  Name: [name] [name] MRN: [id number] Date of Birth: 11/24/1950  [facility]) CM/SW Contact:    Almarie CHRISTELLA Goodie, LCSW Phone Number: 03/09/2024, 4:14 PM  Clinical Narrative:      CSW updated by Rehab Admissions that patient does not have support for CIR, requesting [facility] for SNF. CSW completed referral and faxed to [facility], asked them to review. Awaiting response. CSW to follow.             Expected Discharge Plan: Skilled Nursing Facility Barriers to Discharge: Continued Medical Work up, English As A Second Language Teacher   Patient Goals and CMS Choice Patient states their goals for this hospitalization and ongoing recovery are:: to get rehab CMS Medicare.gov Compare Post Acute Care list provided to:: Patient Choice offered to / list presented to : Patient [place] ownership interest in [facility]provided to:: Patient    Expected Discharge Plan and Services     Post Acute Care Choice: Skilled Nursing Facility Living arrangements for the past 2 months: Single Family Home                                      Prior Living Arrangements/Services Living arrangements for the past 2 months: Single Family Home Lives with:: Self Patient language and need for interpreter reviewed:: No Do you feel safe going back to the place where you live?: Yes      Need for Family Participation in Patient Care: Yes (Comment) Care giver support system in place?: No (comment)   Criminal Activity/Legal Involvement Pertinent to Current Situation/Hospitalization: No - Comment as needed  Activities of Daily Living      Permission Sought/Granted Permission sought to share information with : Facility Medical Sales Representative, Family Supports Permission granted to share information with : Yes, Verbal Permission Granted  Share Information with NAME: Merilee  Permission granted to share info w  AGENCY: SNF  Permission granted to share info w Relationship: Brother     Emotional Assessment Appearance:: Appears stated age Attitude/Demeanor/Rapport: Engaged Affect (typically observed): Appropriate Orientation: : Oriented to Self, Oriented to Place, Oriented to  Time Alcohol / Substance Use: Not Applicable Psych Involvement: No (comment)  Admission diagnosis:  Stroke (cerebrum) (HCC) [I63.9] Fall, initial encounter [W19.XXXA] Closed fracture of multiple ribs, unspecified laterality, initial encounter [S22.49XA] Cerebrovascular accident (CVA), unspecified mechanism (HCC) [I63.9] Patient Active Problem List   Diagnosis Date Noted   Atrial arrhythmia 03/08/2024   Displaced intertrochanteric fracture of right femur, initial encounter for closed fracture (HCC) 03/04/2024   Moderate aortic valve stenosis 03/04/2024   Stroke (cerebrum) (HCC) 03/03/2024   Nonrheumatic aortic (valve) stenosis 09/21/2023   Pure hypercholesterolemia 09/21/2023   Hypertensive heart disease 08/02/2022   Pre-op evaluation 08/02/2022   Heart failure, type unknown (HCC) 08/02/2022   LVH (left ventricular hypertrophy) 08/02/2022   Systolic murmur 08/02/2022   GERD (gastroesophageal reflux disease) 06/30/2018   Hypertensive urgency 06/30/2018   Trimalleolar fracture of left ankle 03/08/2014   Dysphagia, oropharyngeal phase 03/08/2014   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) 03/05/2014   Acute delirium 02/24/2014   Blunt chest trauma 02/09/2014   Fracture of lumbar spine (HCC) 02/09/2014   Multiple rib fractures involving four or more ribs 02/09/2014   Traumatic pneumothorax 02/09/2014   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  History of osteopenia 10/13/2012   Compression fracture of L1 lumbar vertebra (HCC) 10/13/2012   Alcohol abuse 10/13/2012   Esophageal varices (HCC) 01/17/2012   History of colonic polyps 10/29/2011   Cirrhosis (HCC) 10/29/2011    Nonspecific abnormal finding in stool contents 07/26/2011   Lower extremity weakness 07/21/2011   Anemia 07/21/2011   Abnormal LFTs 07/21/2011   Heart failure with improved ejection fraction (HFimpEF) (HCC) 07/21/2011   Moderate protein-calorie malnutrition 07/21/2011   PCP:  Loring Tanda Mae, MD Pharmacy:   [facility] [phone] [place], [place] - [address] [place] [address] Phone: [phone] Fax: [phone]  [facility] [address] [place] [address] Phone: [phone] Fax: [phone]     Social Drivers of Health (SDOH) Social History: SDOH Screenings   Food Insecurity: No Food Insecurity (03/05/2024)  Housing: Unknown (03/05/2024)  Transportation Needs: Patient Declined (03/05/2024)  Utilities: Patient Declined (03/05/2024)  Social Connections: Patient Declined (03/05/2024)  Tobacco Use: Low Risk  (03/06/2024)   SDOH Interventions:     Readmission Risk Interventions     No data to display

## 2024-03-09 NOTE — NC FL2 (Signed)
 [place]  MEDICAID FL2 LEVEL OF CARE FORM     IDENTIFICATION  Patient Name: [name] [name] Birthdate: 03-09-1951 Sex: male Admission Date (Current Location): 03/03/2024  [facility] and [place] Number:  Producer, Television/film/video and Address:  The . [facility], [address] [place], [place] [address]      Provider Number: [id number]  Attending Physician Name and Address:  Stroke, Md, MD  Relative Name and Phone Number:       Current Level of Care: Hospital Recommended Level of Care: Skilled Nursing Facility Prior Approval Number:    Date Approved/Denied:   PASRR Number: [id number]  Discharge Plan: SNF    Current Diagnoses: Patient Active Problem List   Diagnosis Date Noted   Atrial arrhythmia 03/08/2024   Displaced intertrochanteric fracture of right femur, initial encounter for closed fracture (HCC) 03/04/2024   Moderate aortic valve stenosis 03/04/2024   Stroke (cerebrum) (HCC) 03/03/2024   Nonrheumatic aortic (valve) stenosis 09/21/2023   Pure hypercholesterolemia 09/21/2023   Hypertensive heart disease 08/02/2022   Pre-op evaluation 08/02/2022   Heart failure, type unknown (HCC) 08/02/2022   LVH (left ventricular hypertrophy) 08/02/2022   Systolic murmur 08/02/2022   GERD (gastroesophageal reflux disease) 06/30/2018   Hypertensive urgency 06/30/2018   Trimalleolar fracture of left ankle 03/08/2014   Dysphagia, oropharyngeal phase 03/08/2014   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) 03/05/2014   Acute delirium 02/24/2014   Blunt chest trauma 02/09/2014   Fracture of lumbar spine (HCC) 02/09/2014   Multiple rib fractures involving four or more ribs 02/09/2014   Traumatic pneumothorax 02/09/2014   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   History of osteopenia 10/13/2012   Compression fracture of L1 lumbar vertebra (HCC) 10/13/2012   Alcohol abuse 10/13/2012   Esophageal varices (HCC) 01/17/2012    History of colonic polyps 10/29/2011   Cirrhosis (HCC) 10/29/2011   Nonspecific abnormal finding in stool contents 07/26/2011   Lower extremity weakness 07/21/2011   Anemia 07/21/2011   Abnormal LFTs 07/21/2011   Heart failure with improved ejection fraction (HFimpEF) (HCC) 07/21/2011   Moderate protein-calorie malnutrition 07/21/2011    Orientation RESPIRATION BLADDER Height & Weight     Self, Time, Place  Normal Continent Weight: 197 lb 15.6 oz (89.8 kg) Height:  5' 6 (167.6 cm)  BEHAVIORAL SYMPTOMS/MOOD NEUROLOGICAL BOWEL NUTRITION STATUS      Continent Diet (regular)  AMBULATORY STATUS COMMUNICATION OF NEEDS Skin   Extensive Assist Verbally Surgical wounds (Closed right leg, gauze dressing)                       Personal Care Assistance Level of Assistance  Bathing, Feeding, Dressing Bathing Assistance: Maximum assistance Feeding assistance: Limited assistance Dressing Assistance: Maximum assistance     Functional Limitations Info  Sight, Hearing Sight Info: Impaired (wears glasses) Hearing Info: Impaired (hard of hearing)      SPECIAL CARE FACTORS FREQUENCY  PT (By licensed PT), OT (By licensed OT)     PT Frequency: 5x/wk OT Frequency: 5x/wk            Contractures Contractures Info: Not present    Additional Factors Info  Code Status, Allergies, Psychotropic Code Status Info: Full Allergies Info: Adhesive (Tape), Amlodipine , Amoxicillin, Atenolol , Celebrex (Celecoxib), Cephalosporins, Clindamycin/lincomycin, Dexon (Dexamethasone  Sodium Phosphate ), Doxazosin Mesylate, Duraprep (Antiseptic Products, Misc.), Enalapril, Erythromycin, Hctz (Hydrochlorothiazide ), Iodine, Lipitor (Atorvastatin), Other, Toprol  Xl (Metoprolol  Succinate), Verapamil, Vioxx (Rofecoxib), Zocor (Simvastatin), Latex Psychotropic  Info: Seroquel  25mg  daily at bed         Current Medications (03/09/2024):  This is the current hospital active medication list Current  Facility-Administered Medications  Medication Dose Route Frequency Provider Last Rate Last Admin   acetaminophen  (TYLENOL ) tablet 650 mg  650 mg Oral Q4H PRN Moore, Michael A, MD   650 mg at 03/06/24 0256   Or   acetaminophen  (TYLENOL ) 160 MG/5ML solution 650 mg  650 mg Per Tube Q4H PRN Georgina Ozell LABOR, MD       Or   acetaminophen  (TYLENOL ) suppository 650 mg  650 mg Rectal Q4H PRN Moore, Michael A, MD       amiodarone (PACERONE) tablet 200 mg  200 mg Oral BID Moore, Michael A, MD   200 mg at 03/09/24 9057   atorvastatin (LIPITOR) tablet 40 mg  40 mg Oral Daily Moore, Michael A, MD   40 mg at 03/09/24 0942   bisoprolol (ZEBETA) tablet 10 mg  10 mg Oral Daily Moore, Michael A, MD   10 mg at 03/09/24 9057   Chlorhexidine  Gluconate Cloth 2 % PADS 6 each  6 each Topical Daily Georgina Ozell LABOR, MD   6 each at 03/08/24 1104   cyclobenzaprine (FLEXERIL) tablet 5 mg  5 mg Oral TID PRN Moore, Michael A, MD   5 mg at 03/08/24 2048   ezetimibe  (ZETIA ) tablet 10 mg  10 mg Oral Daily Moore, Michael A, MD   10 mg at 03/09/24 9057   fentaNYL  (SUBLIMAZE ) injection 50 mcg  50 mcg Intravenous Q2H PRN Moore, Michael A, MD   50 mcg at 03/06/24 1450   hydrALAZINE  (APRESOLINE ) injection 10 mg  10 mg Intravenous Q4H PRN [place] Aquas A, NP   10 mg at 03/08/24 0855   HYDROcodone -acetaminophen  (NORCO) 7.5-325 MG per tablet 1 tablet  1 tablet Oral Q4H PRN Waddell Aquas LABOR, NP   1 tablet at 03/08/24 1828   Oral care mouth rinse  15 mL Mouth Rinse PRN Georgina Ozell LABOR, MD   15 mL at 03/04/24 0517   oxyCODONE  (Oxy IR/ROXICODONE ) immediate release tablet 10 mg  10 mg Oral Q4H PRN Waddell Aquas LABOR, NP       pantoprazole  (PROTONIX ) EC tablet 40 mg  40 mg Oral QHS Chen, Lydia D, RPH   40 mg at 03/08/24 2048   polyethylene glycol (MIRALAX  / GLYCOLAX ) packet 17 g  17 g Oral Daily Waddell Aquas A, NP   17 g at 03/09/24 9056   QUEtiapine  (SEROQUEL ) tablet 25 mg  25 mg Oral QHS Moore, Michael A, MD   25 mg at 03/08/24 2048    rivaroxaban (XARELTO) tablet 10 mg  10 mg Oral Daily Moore, Michael A, MD   10 mg at 03/09/24 9056   senna-docusate (Senokot-S) tablet 1 tablet  1 tablet Oral QHS PRN Moore, Michael A, MD   1 tablet at 03/07/24 2144   Facility-Administered Medications Ordered in Other Encounters  Medication Dose Route Frequency Provider Last Rate Last Admin   etomidate  (AMIDATE ) injection    Anesthesia Intra-op Antonina Ronal PEDLAR, CRNA   20 mg at 02/27/14 0545   fentaNYL  (SUBLIMAZE ) injection    Anesthesia Intra-op Antonina Ronal PEDLAR, CRNA   100 mcg at 02/27/14 0545   succinylcholine  (ANECTINE ) injection    Anesthesia Intra-op Antonina Ronal PEDLAR, CRNA   120 mg at 02/27/14 0545     Discharge Medications: Please see discharge summary for a list of discharge medications.  Relevant Imaging  Results:  Relevant Lab Results:   Additional Information SS#: [id number]  Almarie CHRISTELLA Goodie, LCSW

## 2024-03-09 NOTE — Progress Notes (Addendum)
 Inpatient Rehab Admissions Coordinator:   I met with Pt. To discuss potential admit. He is interested but not sure if his brother can provide enough support at d/c. I will reach out to discuss.    Addendum: Pt.'s brother and his wife state they cannot care for patient at d/c. He will ultimately need SNF.   Leita Kleine, MS, CCC-SLP Rehab Admissions Coordinator  [phone] (celll) [phone] (office)

## 2024-03-09 NOTE — Progress Notes (Signed)
 Occupational Therapy Treatment Patient Details Name: [name] [name] MRN: [id number] DOB: 04-27-1951 Today's Date: 03/09/2024   History of present illness Pt is a 73 y.o. male presenting 11/1 as code stroke after being found down with laceration to head, confused, agitated, L gaze preference, R weakness. S/p TNK. MRI shows small left occipital infarct. Also found to have R hip fx, s/p ORIF 11/4. PMH:  alcohol abuse, CHF, cirrhosis of the liver, esophageal varices, GERD, OSA, HTN, HLD, sleep apnea, and vocal cord paralysis on the left   OT comments  Patient received in supine and agreeable to OT treatment. Patient required increased time and max assist to get to EOB due to RLE pain. Once on EOB patient continued to have complaints of pain and declined attempting to stand or transfer to recliner. Grooming and UB bathing tasks performed while seated on EOB and patient assisted back to supine with max assist. Patient will benefit from intensive inpatient follow-up therapy, >3 hours/day. Acute OT to continue to follow to address established goals to facilitate DC to next venue of care.        If plan is discharge home, recommend the following:  Two people to help with walking and/or transfers;A lot of help with bathing/dressing/bathroom;Two people to help with bathing/dressing/bathroom;Assistance with cooking/housework;Assist for transportation;Help with stairs or ramp for entrance;Direct supervision/assist for medications management;Direct supervision/assist for financial management   Equipment Recommendations  Other (comment) (defer)    Recommendations for Other Services      Precautions / Restrictions Precautions Precautions: Fall Recall of Precautions/Restrictions: Impaired Restrictions Weight Bearing Restrictions Per Provider Order: Yes RLE Weight Bearing Per Provider Order: Weight bearing as tolerated       Mobility Bed Mobility Overal bed mobility: Needs Assistance Bed Mobility:  Supine to Sit, Sit to Supine     Supine to sit: Max assist Sit to supine: Max assist   General bed mobility comments: bed pad utilized to assist with moving RLE to EOB and pivoting hips.  Increased time due to pain    Transfers Overall transfer level: Needs assistance                 General transfer comment: not attempted due to increased pain     Balance Overall balance assessment: Needs assistance, Mild deficits observed, not formally tested, History of Falls Sitting-balance support: Feet supported, No upper extremity supported Sitting balance-Leahy Scale: Fair Sitting balance - Comments: EOB       Standing balance comment: not attempted                           ADL either performed or assessed with clinical judgement   ADL Overall ADL's : Needs assistance/impaired     Grooming: Wash/dry hands;Wash/dry face;Oral care;Contact guard assist;Sitting Grooming Details (indicate cue type and reason): on EOB Upper Body Bathing: Set up;Sitting;Contact guard assist Upper Body Bathing Details (indicate cue type and reason): on EOB                           General ADL Comments: session limited by RLE pain    Extremity/Trunk Assessment              Vision       Perception     Praxis     Communication Communication Communication: Impaired Factors Affecting Communication: Hearing impaired   Cognition Arousal: Alert Behavior During Therapy: WFL for tasks assessed/performed Cognition: Cognition  impaired   Orientation impairments: Time, Situation Awareness: Intellectual awareness intact, Online awareness impaired Memory impairment (select all impairments): Short-term memory, Declarative long-term memory Attention impairment (select first level of impairment): Sustained attention, Selective attention Executive functioning impairment (select all impairments): Problem solving, Organization, Sequencing OT - Cognition Comments: increased  time for progressing                 Following commands: Impaired Following commands impaired: Follows one step commands with increased time, Follows multi-step commands inconsistently      Cueing   Cueing Techniques: Verbal cues, Tactile cues, Visual cues  Exercises      Shoulder Instructions       General Comments      Pertinent Vitals/ Pain       Pain Assessment Pain Assessment: Faces Faces Pain Scale: Hurts whole lot Pain Location: R hip with movement Pain Descriptors / Indicators: Aching, Discomfort, Grimacing, Crying Pain Intervention(s): Limited activity within patient's tolerance, Monitored during session, Repositioned  Home Living                                          Prior Functioning/Environment              Frequency  Min 2X/week        Progress Toward Goals  OT Goals(current goals can now be found in the care plan section)  Progress towards OT goals: Progressing toward goals  Acute Rehab OT Goals Patient Stated Goal: less pain OT Goal Formulation: With patient Time For Goal Achievement: 03/21/24 Potential to Achieve Goals: Good ADL Goals Pt Will Perform Grooming: with set-up;sitting Pt Will Perform Lower Body Dressing: with adaptive equipment;sit to/from stand;with min assist Pt Will Transfer to Toilet: with min assist;stand pivot transfer Pt/caregiver will Perform Home Exercise Program: Both right and left upper extremity;With written HEP provided;Increased strength Additional ADL Goal #1: pt will follow 3 step commands during BADL with min cues  Plan      Co-evaluation                 AM-PAC OT 6 Clicks Daily Activity     Outcome Measure   Help from another person eating meals?: A Little Help from another person taking care of personal grooming?: A Little Help from another person toileting, which includes using toliet, bedpan, or urinal?: Total Help from another person bathing (including washing,  rinsing, drying)?: A Lot Help from another person to put on and taking off regular upper body clothing?: A Little Help from another person to put on and taking off regular lower body clothing?: Total 6 Click Score: 13    End of Session    OT Visit Diagnosis: Unsteadiness on feet (R26.81);Muscle weakness (generalized) (M62.81);History of falling (Z91.81);Other symptoms and signs involving cognitive function;Low vision, both eyes (H54.2)   Activity Tolerance Patient limited by pain   Patient Left in chair;with call bell/phone within reach;with bed alarm set   Nurse Communication Mobility status;Need for lift equipment        Time: 0800-0827 OT Time Calculation (min): 27 min  Charges: OT General Charges $OT Visit: 1 Visit OT Treatments $Self Care/Home Management : 8-22 mins $Therapeutic Activity: 8-22 mins  Dick Laine, OTA Acute Rehabilitation Services  Office [phone]   Jeb LITTIE Laine 03/09/2024, 11:33 AM

## 2024-03-09 NOTE — Plan of Care (Signed)
  Problem: Pain Managment: Goal: General experience of comfort will improve and/or be controlled Outcome: Progressing   Problem: Safety: Goal: Ability to remain free from injury will improve Outcome: Progressing   Problem: Skin Integrity: Goal: Risk for impaired skin integrity will decrease Outcome: Progressing

## 2024-03-09 NOTE — Progress Notes (Addendum)
 STROKE TEAM PROGRESS NOTE    SIGNIFICANT HOSPITAL EVENTS 11/1 presented with confusion, aphasia and right side weakness. Received IV TNK @ 1630   INTERIM HISTORY/SUBJECTIVE  Brother at bedside.  Patient sitting up in bed.  Continues to have some mild dysarthria and confusion, has improved since admission.  Continues to complain of right leg pain, postop.  Continues on xarelto, Lipitor 40mg   Pending DC planning--CIR versus SNF  CBC    Component Value Date/Time   WBC 12.9 (H) 03/08/2024 0820   RBC 2.41 (L) 03/08/2024 0820   HGB 7.7 (L) 03/08/2024 0820   HCT 24.2 (L) 03/08/2024 0820   PLT 235 03/08/2024 0820   MCV 100.4 (H) 03/08/2024 0820   MCH 32.0 03/08/2024 0820   MCHC 31.8 03/08/2024 0820   RDW 13.8 03/08/2024 0820   LYMPHSABS 1.0 03/03/2024 1546   MONOABS 1.1 (H) 03/03/2024 1546   EOSABS 0.0 03/03/2024 1546   BASOSABS 0.1 03/03/2024 1546    BMET    Component Value Date/Time   NA 136 03/08/2024 0820   NA 137 10/07/2023 1311   K 4.2 03/08/2024 0820   CL 106 03/08/2024 0820   CO2 19 (L) 03/08/2024 0820   GLUCOSE 112 (H) 03/08/2024 0820   BUN 32 (H) 03/08/2024 0820   BUN 22 10/07/2023 1311   CREATININE 1.36 (H) 03/08/2024 0820   CALCIUM 7.9 (L) 03/08/2024 0820   EGFR 30.0 01/20/2024 0936   EGFR 69 10/07/2023 1311   GFRNONAA 55 (L) 03/08/2024 0820    IMAGING past 24 hours No results found.   Vitals:   03/08/24 2347 03/09/24 0408 03/09/24 0725 03/09/24 1112  BP: (!) 170/59 (!) 162/72 (!) 179/59 (!) 172/59  Pulse: 63  (!) 59 (!) 59  Resp: (!) 23 19 20 20   Temp: 98 F (36.7 C) 97.6 F (36.4 C) 97.8 F (36.6 C) 98 F (36.7 C)  TempSrc: Oral Oral Oral Oral  SpO2: 100% 95% 97% 99%  Weight:      Height:        PHYSICAL EXAM General:  Alert, well-nourished, well-developed patient in no acute distress Psych:  Mood and affect appropriate for situation CV: Regular rate and rhythm on monitor Respiratory:  Regular, unlabored respirations on room air GI:  Abdomen soft and nontender   NEURO:  Mental Status: Awake and alert, confused to year only, able to follow commands.  Very hard of hearing.  Mild aphasia continues.   Cranial Nerves:  II: PERRL. Visual fields full.  III, IV, VI: EOMI. Eyelids elevate symmetrically.  V: Sensation is intact to light touch and symmetrical to face.  VII: Face is symmetrical resting and smiling VIII: Very hard of hearing IX, X: Palate elevates symmetrically. Mild dysarthria.  KP:Dynloizm shrug 5/5. XII: tongue is midline without fasciculations. Motor: Moves bilateral uppers and left leg spontaneous and antigravity, right leg minimal movement due to right femur fracture can wiggle toes Tone: is normal and bulk is normal Sensation- Intact to light touch bilaterally. Extinction absent to light touch to DSS.   Coordination: FTN intact bilaterally, HKS: no ataxia in BLE.No drift.  Gait- deferred   Most Recent NIH: 3     ASSESSMENT/PLAN   [name] is a 73 y.o. 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  Per EMS will  check was called and EMS found him on the floor with a laceration on his head, noted to be confused agitated, with left gaze preference and hypertensive at 240/110. NIH on Admission 18   Strokelike episode: Presenting with confusion aphasia and left gaze deviation.  s/p TNK with fall and right hip femur fracture.  MRI shows small left occipital infarct which does not explain his presenting symptoms Etiology: Likely cardioembolic Code Stroke  CT head No acute abnormality. ASPECTS 10.  Old left frontal infarct and chronic ischemic white matter changes  CTA head & neck No LVO Atherosclerotic disease at both carotid bifurcations without hemodynamically significant stenosis  MRI small left superior  occipital cortical infarct 2D Echo EF > 75%.  Mild LVH with grade 1 diastolic dysfunction LDL 127 HgbA1c 5.3 VTE prophylaxis -on Xarelto No antithrombotic prior to admission, continue Xarelto Therapy recommendations:  CIR Disposition: Pending   Hx of Stroke/TIA Old left frontal infarct on brain imaging   S/p unwitnessed fall Acute right femur fracture Pelvis x-ray Right femoral intertrochanteric fracture with displacement and varus angulation. CT chest abdomen pelvis:  Moderately displaced and comminuted intertrochanteric fracture of the proximal right femur, acute. Old bilateral rib fractures, chronic. Chronic L1 vertebral body compression fracture, chronic. Status post appendectomy. Aortic atherosclerosis. Coronary artery calcifications. Diverticulosis of the descending and sigmoid colon without evidence of inflammation. Possible bilateral nephrolithiasis. Ortho consulted S/p Right hip fracture repair on 11//2025   Hypertension CHF Home meds: Amlodipine  5 mg, propranolol  20 mg Stable On amio and bisoprolol  Blood Pressure Goal: BP less than 180/105    Hyperlipidemia Home meds: Zetia  10 mg,  resumed in hospital LDL 127, goal < 70 Atorvastatin 40 mg added  Continue statin at discharge   Substance Abuse History of EtOH abuse History of liver cirrhosis, esophageal varices UDS positive ordered ETOH use, alcohol level <15, advised to drink no more than 2 drink(s) a day TOC consult for cessation placed   AKI  Cr 1.36 - pending labs today  Other Stroke Risk Factors Obesity, Body mass index is 31.95 kg/m., BMI >/= 30 associated with increased stroke risk, recommend weight loss, diet and exercise as appropriate  Coronary artery disease Congestive heart failure Obstructive sleep apnea   Hospital day # 6   Pt seen by Neuro NP/APP with MD. Note/plan to be edited by MD as needed.    [name], DNP [facility] Please use AMION for contact information  & EPIC for messaging.  I have personally obtained history,examined this patient, reviewed notes, independently viewed imaging studies, participated in medical decision making and plan of care.ROS completed by me personally and pertinent positives fully documented  I have made any additions or clarifications directly to the above note. Agree with note above.    Eather Popp, MD Medical Director [facility] Pager: [phone] 03/09/2024 2:30 PM   To contact Stroke Continuity provider, please refer to [email or web address]. After hours, contact General Neurology

## 2024-03-09 NOTE — Plan of Care (Signed)
 Problem: Education: Goal: Knowledge of disease or condition will improve 03/09/2024 1718 by [name] Colton BROCKS, RN Outcome: Progressing 03/09/2024 1718 by [name] Colton BROCKS, RN Outcome: Progressing Goal: Knowledge of secondary prevention will improve (MUST DOCUMENT ALL) 03/09/2024 1718 by [name] Colton BROCKS, RN Outcome: Progressing 03/09/2024 1718 by [name] Colton BROCKS, RN Outcome: Progressing Goal: Knowledge of patient specific risk factors will improve (DELETE if not current risk factor) 03/09/2024 1718 by [name] Colton BROCKS, RN Outcome: Progressing 03/09/2024 1718 by [name] Colton BROCKS, RN Outcome: Progressing   Problem: Ischemic Stroke/TIA Tissue Perfusion: Goal: Complications of ischemic stroke/TIA will be minimized 03/09/2024 1718 by [name] Colton BROCKS, RN Outcome: Progressing 03/09/2024 1718 by [name] Colton BROCKS, RN Outcome: Progressing   Problem: Coping: Goal: Will verbalize positive feelings about self 03/09/2024 1718 by [name] Colton BROCKS, RN Outcome: Progressing 03/09/2024 1718 by [name] Colton BROCKS, RN Outcome: Progressing Goal: Will identify appropriate support needs 03/09/2024 1718 by [name] Colton BROCKS, RN Outcome: Progressing 03/09/2024 1718 by [name] Colton BROCKS, RN Outcome: Progressing   Problem: Health Behavior/Discharge Planning: Goal: Ability to manage health-related needs will improve 03/09/2024 1718 by [name] Colton BROCKS, RN Outcome: Progressing 03/09/2024 1718 by [name] Colton BROCKS, RN Outcome: Progressing Goal: Goals will be collaboratively established with patient/family 03/09/2024 1718 by [name] Colton BROCKS, RN Outcome: Progressing 03/09/2024 1718 by [name] Colton BROCKS, RN Outcome: Progressing   Problem: Self-Care: Goal: Ability to participate in self-care as condition permits will improve 03/09/2024 1718 by [name] Colton BROCKS, RN Outcome: Progressing 03/09/2024 1718 by [name] Colton BROCKS, RN Outcome: Progressing Goal: Verbalization of feelings and concerns over difficulty with self-care will improve 03/09/2024 1718 by  [name] Colton BROCKS, RN Outcome: Progressing 03/09/2024 1718 by [name] Colton BROCKS, RN Outcome: Progressing Goal: Ability to communicate needs accurately will improve 03/09/2024 1718 by [name] Colton BROCKS, RN Outcome: Progressing 03/09/2024 1718 by [name] Colton BROCKS, RN Outcome: Progressing   Problem: Nutrition: Goal: Risk of aspiration will decrease 03/09/2024 1718 by [name] Colton BROCKS, RN Outcome: Progressing 03/09/2024 1718 by [name] Colton BROCKS, RN Outcome: Progressing Goal: Dietary intake will improve 03/09/2024 1718 by [name] Colton BROCKS, RN Outcome: Progressing 03/09/2024 1718 by [name] Colton BROCKS, RN Outcome: Progressing   Problem: Education: Goal: Knowledge of General Education information will improve Description: Including pain rating scale, medication(s)/side effects and non-pharmacologic comfort measures 03/09/2024 1718 by [name] Colton BROCKS, RN Outcome: Progressing 03/09/2024 1718 by [name] Colton BROCKS, RN Outcome: Progressing   Problem: Health Behavior/Discharge Planning: Goal: Ability to manage health-related needs will improve 03/09/2024 1718 by [name] Colton BROCKS, RN Outcome: Progressing 03/09/2024 1718 by [name] Colton BROCKS, RN Outcome: Progressing   Problem: Clinical Measurements: Goal: Ability to maintain clinical measurements within normal limits will improve 03/09/2024 1718 by [name] Colton BROCKS, RN Outcome: Progressing 03/09/2024 1718 by [name] Colton BROCKS, RN Outcome: Progressing Goal: Will remain free from infection 03/09/2024 1718 by [name] Colton BROCKS, RN Outcome: Progressing 03/09/2024 1718 by [name] Colton BROCKS, RN Outcome: Progressing Goal: Diagnostic test results will improve 03/09/2024 1718 by [name] Colton BROCKS, RN Outcome: Progressing 03/09/2024 1718 by [name] Colton BROCKS, RN Outcome: Progressing Goal: Respiratory complications will improve 03/09/2024 1718 by [name] Colton BROCKS, RN Outcome: Progressing 03/09/2024 1718 by [name] Colton BROCKS, RN Outcome: Progressing Goal: Cardiovascular complication will be  avoided 03/09/2024 1718 by [name] Colton BROCKS, RN Outcome: Progressing 03/09/2024 1718 by [name] Colton BROCKS, RN Outcome: Progressing   Problem: Nutrition: Goal: Adequate nutrition will be maintained 03/09/2024 1718 by [name] Colton BROCKS, RN Outcome: Progressing 03/09/2024 1718 by [name],  Tahj Njoku C, RN Outcome: Progressing   Problem: Coping: Goal: Level of anxiety will decrease 03/09/2024 1718 by [name] Colton BROCKS, RN Outcome: Progressing 03/09/2024 1718 by [name] Colton BROCKS, RN Outcome: Progressing   Problem: Elimination: Goal: Will not experience complications related to bowel motility 03/09/2024 1718 by [name] Colton BROCKS, RN Outcome: Progressing 03/09/2024 1718 by [name] Colton BROCKS, RN Outcome: Progressing Goal: Will not experience complications related to urinary retention 03/09/2024 1718 by [name] Colton BROCKS, RN Outcome: Progressing 03/09/2024 1718 by [name] Colton BROCKS, RN Outcome: Progressing   Problem: Pain Managment: Goal: General experience of comfort will improve and/or be controlled 03/09/2024 1718 by [name] Colton BROCKS, RN Outcome: Progressing 03/09/2024 1718 by [name] Colton BROCKS, RN Outcome: Progressing   Problem: Safety: Goal: Ability to remain free from injury will improve 03/09/2024 1718 by [name] Colton BROCKS, RN Outcome: Progressing 03/09/2024 1718 by [name] Colton BROCKS, RN Outcome: Progressing   Problem: Skin Integrity: Goal: Risk for impaired skin integrity will decrease 03/09/2024 1718 by [name] Colton BROCKS, RN Outcome: Progressing 03/09/2024 1718 by [name] Colton BROCKS, RN Outcome: Progressing

## 2024-03-10 LAB — HEPATIC FUNCTION PANEL
ALT: 105 U/L — ABNORMAL HIGH (ref 0–44)
AST: 97 U/L — ABNORMAL HIGH (ref 15–41)
Albumin: 2.4 g/dL — ABNORMAL LOW (ref 3.5–5.0)
Alkaline Phosphatase: 98 U/L (ref 38–126)
Bilirubin, Direct: 0.4 mg/dL — ABNORMAL HIGH (ref 0.0–0.2)
Indirect Bilirubin: 0.7 mg/dL (ref 0.3–0.9)
Total Bilirubin: 1.1 mg/dL (ref 0.0–1.2)
Total Protein: 5.4 g/dL — ABNORMAL LOW (ref 6.5–8.1)

## 2024-03-10 NOTE — Plan of Care (Signed)

## 2024-03-10 NOTE — Progress Notes (Addendum)
 STROKE TEAM PROGRESS NOTE    SIGNIFICANT HOSPITAL EVENTS 11/1 presented with confusion, aphasia and right side weakness. Received IV TNK @ 1630   INTERIM HISTORY/SUBJECTIVE  Patient sitting up in bed.  Confusion has improved since admission.  Continues to complain of right leg pain  Continues on xarelto, Lipitor 40mg   Pending DC planning for SNF, as patient will not have 24hour care available after a rehab stay.   CBC    Component Value Date/Time   WBC 12.9 (H) 03/08/2024 0820   RBC 2.41 (L) 03/08/2024 0820   HGB 7.7 (L) 03/08/2024 0820   HCT 24.2 (L) 03/08/2024 0820   PLT 235 03/08/2024 0820   MCV 100.4 (H) 03/08/2024 0820   MCH 32.0 03/08/2024 0820   MCHC 31.8 03/08/2024 0820   RDW 13.8 03/08/2024 0820   LYMPHSABS 1.0 03/03/2024 1546   MONOABS 1.1 (H) 03/03/2024 1546   EOSABS 0.0 03/03/2024 1546   BASOSABS 0.1 03/03/2024 1546    BMET    Component Value Date/Time   NA 137 03/09/2024 1538   NA 137 10/07/2023 1311   K 4.3 03/09/2024 1538   CL 108 03/09/2024 1538   CO2 20 (L) 03/09/2024 1538   GLUCOSE 126 (H) 03/09/2024 1538   BUN 25 (H) 03/09/2024 1538   BUN 22 10/07/2023 1311   CREATININE 1.29 (H) 03/09/2024 1538   CALCIUM 7.4 (L) 03/09/2024 1538   EGFR 30.0 01/20/2024 0936   EGFR 69 10/07/2023 1311   GFRNONAA 58 (L) 03/09/2024 1538    IMAGING past 24 hours No results found.   Vitals:   03/10/24 0023 03/10/24 0403 03/10/24 0404 03/10/24 0812  BP: (!) 147/49 (!) 165/52 (!) 165/52 (!) 177/45  Pulse:  (!) 47  (!) 52  Resp:  (!) 26  20  Temp:  98.7 F (37.1 C)  97.9 F (36.6 C)  TempSrc:  Oral  Oral  SpO2:  98%  100%  Weight:      Height:        PHYSICAL EXAM General:  Alert, well-nourished, well-developed patient in no acute distress Psych:  Mood and affect appropriate for situation CV: Regular rate and rhythm on monitor Respiratory:  Regular, unlabored respirations on room air GI: Abdomen soft and nontender   NEURO:  Mental Status: Awake and  alert, oriented to place, month, year. able to follow commands.  Very hard of hearing.    Cranial Nerves:  II: PERRL. Visual fields full.  III, IV, VI: EOMI. Eyelids elevate symmetrically.  V: Sensation is intact to light touch and symmetrical to face.  VII: Face is symmetrical resting and smiling VIII: Very hard of hearing IX, X: Palate elevates symmetrically. Mild dysarthria--improved KP:Dynloizm shrug 5/5. XII: tongue is midline without fasciculations. Motor: Moves bilateral uppers and left leg spontaneous and antigravity, right leg minimal movement due to right femur fracture can wiggle toes Tone: is normal and bulk is normal Sensation- Intact to light touch bilaterally. Extinction absent to light touch to DSS.   Coordination: FTN intact bilaterally, HKS: no ataxia in BLE.No drift.  Gait- deferred   Most Recent NIH: 3   ASSESSMENT/PLAN   [name] [name] is a 73 y.o. 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  Per EMS will check was called and EMS found him on the floor  with a laceration on his head, noted to be confused agitated, with left gaze preference and hypertensive at 240/110. NIH on Admission 18   Strokelike episode: Presenting with confusion aphasia and left gaze deviation.  s/p TNK with fall and right hip femur fracture.  MRI shows small left occipital infarct which does not explain his presenting symptoms Etiology: Likely cardioembolic Code Stroke  CT head No acute abnormality. ASPECTS 10.  Old left frontal infarct and chronic ischemic white matter changes  CTA head & neck No LVO Atherosclerotic disease at both carotid bifurcations without hemodynamically significant stenosis  MRI small left superior occipital cortical infarct 2D Echo EF > 75%.  Mild LVH with grade 1 diastolic  dysfunction LDL 127 [id number] 5.3 VTE prophylaxis -on Xarelto No antithrombotic prior to admission, continue Xarelto Therapy recommendations:  CIR Disposition: Pending   Hx of Stroke/TIA Old left frontal infarct on brain imaging   S/p unwitnessed fall Acute right femur fracture Pelvis x-ray Right femoral intertrochanteric fracture with displacement and varus angulation. CT chest abdomen pelvis:  Moderately displaced and comminuted intertrochanteric fracture of the proximal right femur, acute. Old bilateral rib fractures, chronic. Chronic L1 vertebral body compression fracture, chronic. Status post appendectomy. Aortic atherosclerosis. Coronary artery calcifications. Diverticulosis of the descending and sigmoid colon without evidence of inflammation. Possible bilateral nephrolithiasis. Ortho consulted S/p Right hip fracture repair on 11//2025   Hypertension CHF Home meds: Amlodipine  5 mg, propranolol  20 mg Stable On amio and bisoprolol  Blood Pressure Goal: BP less than 180/105    Hyperlipidemia Home meds: Zetia  10 mg,  resumed in hospital LDL 127, goal < 70 Atorvastatin 40 mg added  Continue statin at discharge   Substance Abuse History of EtOH abuse History of liver cirrhosis, esophageal varices UDS positive ordered ETOH use, alcohol level <15, advised to drink no more than 2 drink(s) a day TOC consult for cessation placed   AKI, improving Cr 1.36 - 1.29  Other Stroke Risk Factors Obesity, Body mass index is 31.95 kg/m., BMI >/= 30 associated with increased stroke risk, recommend weight loss, diet and exercise as appropriate  Coronary artery disease Congestive heart failure Obstructive sleep apnea   Hospital day # 7   Pt seen by Neuro NP/APP with MD. Note/plan to be edited by MD as needed.    [name], DNP [facility] Please use AMION for contact information & EPIC for messaging.  I have personally obtained history,examined this patient,  reviewed notes, independently viewed imaging studies, participated in medical decision making and plan of care.ROS completed by me personally and pertinent positives fully documented  I have made any additions or clarifications directly to the above note. Agree with note above.  Patient medically stable for transfer to SNF rehab when bed available.  Eather Popp, MD Medical Director [facility] Pager: [phone] 03/10/2024 3:23 PM  To contact Stroke Continuity provider, please refer to [email or web address]. After hours, contact General Neurology

## 2024-03-11 ENCOUNTER — Inpatient Hospital Stay (HOSPITAL_COMMUNITY)

## 2024-03-11 DIAGNOSIS — I480 Paroxysmal atrial fibrillation: Secondary | ICD-10-CM

## 2024-03-11 DIAGNOSIS — I639 Cerebral infarction, unspecified: Secondary | ICD-10-CM

## 2024-03-11 LAB — CBC
HCT: 22.6 % — ABNORMAL LOW (ref 39.0–52.0)
Hemoglobin: 7.2 g/dL — ABNORMAL LOW (ref 13.0–17.0)
MCH: 31.9 pg (ref 26.0–34.0)
MCHC: 31.9 g/dL (ref 30.0–36.0)
MCV: 100 fL (ref 80.0–100.0)
Platelets: 273 K/uL (ref 150–400)
RBC: 2.26 MIL/uL — ABNORMAL LOW (ref 4.22–5.81)
RDW: 13.9 % (ref 11.5–15.5)
WBC: 13 K/uL — ABNORMAL HIGH (ref 4.0–10.5)
nRBC: 0 % (ref 0.0–0.2)

## 2024-03-11 LAB — BASIC METABOLIC PANEL WITH GFR
Anion gap: 11 (ref 5–15)
BUN: 25 mg/dL — ABNORMAL HIGH (ref 8–23)
CO2: 20 mmol/L — ABNORMAL LOW (ref 22–32)
Calcium: 8 mg/dL — ABNORMAL LOW (ref 8.9–10.3)
Chloride: 103 mmol/L (ref 98–111)
Creatinine, Ser: 1.13 mg/dL (ref 0.61–1.24)
GFR, Estimated: >60 mL/min (ref >60)
Glucose, Bld: 113 mg/dL — ABNORMAL HIGH (ref 70–99)
Potassium: 4.4 mmol/L (ref 3.5–5.1)
Sodium: 134 mmol/L — ABNORMAL LOW (ref 135–145)

## 2024-03-11 LAB — ALBUMIN: Albumin: 2.5 g/dL — ABNORMAL LOW (ref 3.5–5.0)

## 2024-03-11 MED ORDER — HEPARIN SODIUM (PORCINE) 5000 UNIT/ML IJ SOLN
5000.0000 [IU] | Freq: Three times a day (TID) | INTRAMUSCULAR | Status: DC
  Administered 2024-03-12 – 2024-03-14 (×7): 5000 [IU] via SUBCUTANEOUS
  Filled 2024-03-11 (×7): qty 1

## 2024-03-11 MED ORDER — ALBUMIN HUMAN 25 % IV SOLN
12.5000 g | Freq: Once | INTRAVENOUS | Status: AC
Start: 2024-03-11 — End: 2024-03-11
  Administered 2024-03-11: 12.5 g via INTRAVENOUS
  Filled 2024-03-11: qty 50

## 2024-03-11 NOTE — Plan of Care (Signed)

## 2024-03-11 NOTE — Plan of Care (Signed)
  Problem: Ischemic Stroke/TIA Tissue Perfusion: Goal: Complications of ischemic stroke/TIA will be minimized Outcome: Progressing   Problem: Coping: Goal: Will verbalize positive feelings about self Outcome: Progressing   Problem: Health Behavior/Discharge Planning: Goal: Ability to manage health-related needs will improve Outcome: Progressing   Problem: Self-Care: Goal: Ability to communicate needs accurately will improve Outcome: Progressing   Problem: Clinical Measurements: Goal: Will remain free from infection Outcome: Progressing   Problem: Activity: Goal: Risk for activity intolerance will decrease Outcome: Progressing   Problem: Coping: Goal: Level of anxiety will decrease Outcome: Progressing   Problem: Pain Managment: Goal: General experience of comfort will improve and/or be controlled Outcome: Progressing   Problem: Safety: Goal: Ability to remain free from injury will improve Outcome: Progressing   Problem: Skin Integrity: Goal: Risk for impaired skin integrity will decrease Outcome: Progressing

## 2024-03-11 NOTE — Progress Notes (Addendum)
 STROKE TEAM PROGRESS NOTE   INTERIM HISTORY/SUBJECTIVE No family at the bedside. Pt lying in bed, not in distress. Still complaining of pain at R hip and was given PRN pain meds. Hb down to 7.2, change Xarelto 10mg  daily to heparin  subcutaneous Q8h. No overt bleeding.   CBC    Component Value Date/Time   WBC 13.0 (H) 03/11/2024 0102   RBC 2.26 (L) 03/11/2024 0102   HGB 7.2 (L) 03/11/2024 0102   HCT 22.6 (L) 03/11/2024 0102   PLT 273 03/11/2024 0102   MCV 100.0 03/11/2024 0102   MCH 31.9 03/11/2024 0102   MCHC 31.9 03/11/2024 0102   RDW 13.9 03/11/2024 0102   LYMPHSABS 1.0 03/03/2024 1546   MONOABS 1.1 (H) 03/03/2024 1546   EOSABS 0.0 03/03/2024 1546   BASOSABS 0.1 03/03/2024 1546    BMET    Component Value Date/Time   NA 134 (L) 03/11/2024 0102   NA 137 10/07/2023 1311   K 4.4 03/11/2024 0102   CL 103 03/11/2024 0102   CO2 20 (L) 03/11/2024 0102   GLUCOSE 113 (H) 03/11/2024 0102   BUN 25 (H) 03/11/2024 0102   BUN 22 10/07/2023 1311   CREATININE 1.13 03/11/2024 0102   CALCIUM 8.0 (L) 03/11/2024 0102   EGFR 30.0 01/20/2024 0936   EGFR 69 10/07/2023 1311   GFRNONAA >60 03/11/2024 0102    IMAGING past 24 hours No results found.   Vitals:   03/10/24 2030 03/11/24 0029 03/11/24 0406 03/11/24 0821  BP: (!) 165/55 (!) 153/50 (!) 166/54 (!) 176/57  Pulse: (!) 51 (!) 52 (!) 55 (!) 53  Resp: 18 18 18 20   Temp: (!) 97.3 F (36.3 C) 98.8 F (37.1 C) 98.9 F (37.2 C) 98.8 F (37.1 C)  TempSrc: Oral Oral Oral Oral  SpO2: 99% 99% 98% 99%  Weight:      Height:        PHYSICAL EXAM General:  Alert, well-nourished, well-developed patient in no acute distress CV: Regular rate and rhythm on monitor Respiratory:  Regular, unlabored respirations on room air GI: Abdomen soft and nontender   NEURO: awake, alert, eyes open, orientated to place, time, but told me age 60 instead of 78, not quit orientated to situation. No aphasia, paucity of speech but answering questions  appropriately, following all simple commands. Able to name 3/4 and repeat simple sentence with some difficulty with complex sentences. No gaze palsy, tracking bilaterally, visual field full, PERRL. No facial droop. Tongue midline. LUE 5/5, LUE proximal 4/5 due to rotate cuff injury, LLE proximal 3/5 and distally 5/5. RLE proximal 2-/5 due to pain at hip, distally 5/5. Sensation symmetrical bilaterally, b/l FTN intact but slow R>L, gait not tested.     ASSESSMENT/PLAN   [name] is a 73 y.o. 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  Per EMS will check was called and EMS found him on the floor with a laceration on his head, noted to be confused agitated, with left gaze preference and hypertensive at 240/110. NIH on Admission 18   Stroke s/p TNK - punctate left occipital cortex infarct, etiology: Likely new diagnosed PAF/aflutter Code Stroke  CT head No acute abnormality. ASPECTS 10.  Old left frontal infarct and chronic ischemic white matter changes  CTA head & neck No LVO  Atherosclerotic disease at both carotid bifurcations without hemodynamically significant stenosis  MRI small left superior occipital cortical infarct 2D Echo EF > 75%.  Mild LVH with grade 1 diastolic dysfunction BLE DVT US : Negative for DVT LDL 127 HgbA1c 5.3 VTE prophylaxis - change Xarelto 10mg  daily to Heparin  SQ Q8h No antithrombotic prior to admission, no antithrombotics for now given severe anemia Therapy recommendations:  CIR vs. SNF Disposition: Pending   PAF / Aflutter with RVR EKG showed afib with RVR Cardiology on board, concerning for aflutter New diagnosis Telemetry On p.o. amiodarone no antithrombotics for now given severe anemia  S/p unwitnessed fall Acute right femur fracture Pelvis x-ray Right  femoral intertrochanteric fracture with displacement and varus angulation. CT chest abdomen pelvis:  Moderately displaced and comminuted intertrochanteric fracture of the proximal right femur, acute. Old bilateral rib fractures, chronic. Chronic L1 vertebral body compression fracture, chronic. Status post appendectomy. Aortic atherosclerosis. Coronary artery calcifications. Diverticulosis of the descending and sigmoid colon without evidence of inflammation. Possible bilateral nephrolithiasis. Ortho consulted S/p Right hip fracture repair on 11//2025 Pain controlled   Severe anemia Hb 15.0--11.0--7.3--7.7 No overt bleeding CBC monitoring Blood transfusion if hemoglobin less than 7  Hypertension Home meds: Amlodipine  5 mg, propranolol  20 mg Stable On bisoprolol  Blood Pressure Goal: BP less than 180/105  Long-term BP goal normotensive   Hyperlipidemia Home meds: Zetia  10 mg,  resumed in hospital LDL 127, goal < 70 Atorvastatin 40 mg added  Continue both meds at discharge   Substance Abuse History of EtOH abuse History of liver cirrhosis, esophageal varices UDS positive for THC TOC consult for cessation placed   AKI, improving Cr 1.36 - 1.29 Encourage p.o. intake BMP monitoring  Other Stroke Risk Factors Obesity, Body mass index is 31.95 kg/m., BMI >/= 30 associated with increased stroke risk, recommend weight loss, diet and exercise as appropriate  Silent stroke, Old left frontal infarct on brain imaging CHF Obstructive sleep apnea  Other active problems History of left vocal cord paralyzed this   Hospital day # 8  [name] Cummins, MD PhD Stroke Neurology 03/11/2024 7:06 PM     To contact Stroke Continuity provider, please refer to [email or web address]. After hours, contact General Neurology

## 2024-03-11 NOTE — Progress Notes (Signed)
 Bilateral lower extremity venous duplex has been completed. Preliminary results can be found in CV Proc through chart review.   03/11/24 10:24 AM Cathlyn Collet RVT

## 2024-03-12 LAB — CBC
HCT: 22.5 % — ABNORMAL LOW (ref 39.0–52.0)
Hemoglobin: 7.1 g/dL — ABNORMAL LOW (ref 13.0–17.0)
MCH: 31.4 pg (ref 26.0–34.0)
MCHC: 31.6 g/dL (ref 30.0–36.0)
MCV: 99.6 fL (ref 80.0–100.0)
Platelets: 302 K/uL (ref 150–400)
RBC: 2.26 MIL/uL — ABNORMAL LOW (ref 4.22–5.81)
RDW: 13.8 % (ref 11.5–15.5)
WBC: 11 K/uL — ABNORMAL HIGH (ref 4.0–10.5)
nRBC: 0.2 % (ref 0.0–0.2)

## 2024-03-12 LAB — BASIC METABOLIC PANEL WITH GFR
Anion gap: 7 (ref 5–15)
BUN: 28 mg/dL — ABNORMAL HIGH (ref 8–23)
CO2: 22 mmol/L (ref 22–32)
Calcium: 8 mg/dL — ABNORMAL LOW (ref 8.9–10.3)
Chloride: 104 mmol/L (ref 98–111)
Creatinine, Ser: 1.32 mg/dL — ABNORMAL HIGH (ref 0.61–1.24)
GFR, Estimated: 57 mL/min — ABNORMAL LOW (ref >60)
Glucose, Bld: 106 mg/dL — ABNORMAL HIGH (ref 70–99)
Potassium: 4.9 mmol/L (ref 3.5–5.1)
Sodium: 133 mmol/L — ABNORMAL LOW (ref 135–145)

## 2024-03-12 LAB — PREPARE RBC (CROSSMATCH)

## 2024-03-12 MED ORDER — SODIUM CHLORIDE 0.9% IV SOLUTION: Freq: Once | INTRAVENOUS | Status: AC

## 2024-03-12 NOTE — TOC Progression Note (Signed)
 Transition of Care [facility]) - Progression Note    Patient Details  Name: [name] [name] MRN: [id number] Date of Birth: [birth year]  Transition of Care [facility]) CM/SW Contact  Luann SHAUNNA [place], [place] Phone Number: 03/12/2024, 3:51 PM  Clinical Narrative:     [facility] cannot offer bed. CSW notified pt and provided other SNF bed offers and medicare star ratings to pt. Explained SNF auth process once a SNF choice is made. Pt states he will review offers.   Expected Discharge Plan: Skilled Nursing Facility Barriers to Discharge: Continued Medical Work up, English As A Second Language Teacher               Expected Discharge Plan and Services     Post Acute Care Choice: Skilled Nursing Facility Living arrangements for the past 2 months: Single Family Home                                       Social Drivers of Health (SDOH) Interventions SDOH Screenings   Food Insecurity: No Food Insecurity (03/05/2024)  Housing: Unknown (03/05/2024)  Transportation Needs: Patient Declined (03/05/2024)  Utilities: Patient Declined (03/05/2024)  Social Connections: Patient Declined (03/05/2024)  Tobacco Use: Low Risk  (03/06/2024)    Readmission Risk Interventions     No data to display

## 2024-03-12 NOTE — Plan of Care (Signed)
  Problem: Education: Goal: Knowledge of disease or condition will improve Outcome: Progressing   Problem: Ischemic Stroke/TIA Tissue Perfusion: Goal: Complications of ischemic stroke/TIA will be minimized Outcome: Progressing   Problem: Coping: Goal: Will verbalize positive feelings about self Outcome: Progressing   Problem: Health Behavior/Discharge Planning: Goal: Ability to manage health-related needs will improve Outcome: Progressing   Problem: Self-Care: Goal: Ability to communicate needs accurately will improve Outcome: Progressing   Problem: Nutrition: Goal: Risk of aspiration will decrease Outcome: Progressing Goal: Dietary intake will improve Outcome: Progressing   Problem: Health Behavior/Discharge Planning: Goal: Ability to manage health-related needs will improve Outcome: Progressing

## 2024-03-12 NOTE — Progress Notes (Addendum)
 STROKE TEAM PROGRESS NOTE   INTERIM HISTORY/SUBJECTIVE No family at the bedside. Pt lying in bed, not in distress. Hgb 7.2 -> 7.1. 1u PRBC today. Still reporting pain in right leg and hip. Able to stand and pivot today. Awaiting SNF response.   CBC    Component Value Date/Time   WBC 11.0 (H) 03/12/2024 0218   RBC 2.26 (L) 03/12/2024 0218   HGB 7.1 (L) 03/12/2024 0218   HCT 22.5 (L) 03/12/2024 0218   PLT 302 03/12/2024 0218   MCV 99.6 03/12/2024 0218   MCH 31.4 03/12/2024 0218   MCHC 31.6 03/12/2024 0218   RDW 13.8 03/12/2024 0218   LYMPHSABS 1.0 03/03/2024 1546   MONOABS 1.1 (H) 03/03/2024 1546   EOSABS 0.0 03/03/2024 1546   BASOSABS 0.1 03/03/2024 1546    BMET    Component Value Date/Time   NA 133 (L) 03/12/2024 0218   NA 137 10/07/2023 1311   K 4.9 03/12/2024 0218   CL 104 03/12/2024 0218   CO2 22 03/12/2024 0218   GLUCOSE 106 (H) 03/12/2024 0218   BUN 28 (H) 03/12/2024 0218   BUN 22 10/07/2023 1311   CREATININE 1.32 (H) 03/12/2024 0218   CALCIUM 8.0 (L) 03/12/2024 0218   EGFR 30.0 01/20/2024 0936   EGFR 69 10/07/2023 1311   GFRNONAA 57 (L) 03/12/2024 0218    IMAGING past 24 hours VAS US  LOWER EXTREMITY VENOUS (DVT) Result Date: 03/11/2024  Lower Venous DVT Study Patient Name:  [name] [name]  Date of Exam:   03/11/2024 Medical Rec #: [id number]      Accession #:    [id number] Date of Birth: 1951/04/14      Patient Gender: M Patient Age:   73 years Exam Location:  [facility] Procedure:      VAS US  LOWER EXTREMITY VENOUS (DVT) Referring Phys: [name] Sayuri Rhames --------------------------------------------------------------------------------  Indications: Stroke.  Risk Factors: None identified. Limitations: Poor ultrasound/tissue interface and body habitus. Comparison Study: No prior studies. Performing Technologist: Cordella Collet RVT  Examination Guidelines: A complete evaluation includes B-mode imaging, spectral Doppler, color Doppler, and power Doppler as needed of all  accessible portions of each vessel. Bilateral testing is considered an integral part of a complete examination. Limited examinations for reoccurring indications may be performed as noted. The reflux portion of the exam is performed with the patient in reverse Trendelenburg.  +---------+---------------+---------+-----------+----------+-------------------+ RIGHT    CompressibilityPhasicitySpontaneityPropertiesThrombus Aging      +---------+---------------+---------+-----------+----------+-------------------+ CFV      Full           Yes      Yes                                      +---------+---------------+---------+-----------+----------+-------------------+ SFJ      Full                                                             +---------+---------------+---------+-----------+----------+-------------------+ FV Prox  Full                                                             +---------+---------------+---------+-----------+----------+-------------------+  FV Mid   Full                                                             +---------+---------------+---------+-----------+----------+-------------------+ FV DistalFull           Yes      Yes                                      +---------+---------------+---------+-----------+----------+-------------------+ PFV      Full                                                             +---------+---------------+---------+-----------+----------+-------------------+ POP      Full           Yes      Yes                                      +---------+---------------+---------+-----------+----------+-------------------+ PTV      Full                                                             +---------+---------------+---------+-----------+----------+-------------------+ PERO                                                  Not well visualized  +---------+---------------+---------+-----------+----------+-------------------+   +---------+---------------+---------+-----------+----------+-------------------+ LEFT     CompressibilityPhasicitySpontaneityPropertiesThrombus Aging      +---------+---------------+---------+-----------+----------+-------------------+ CFV      Full           Yes      Yes                                      +---------+---------------+---------+-----------+----------+-------------------+ SFJ      Full                                                             +---------+---------------+---------+-----------+----------+-------------------+ FV Prox  Full                                                             +---------+---------------+---------+-----------+----------+-------------------+ FV Mid   Full                                                             +---------+---------------+---------+-----------+----------+-------------------+  FV DistalFull                                                             +---------+---------------+---------+-----------+----------+-------------------+ PFV      Full                                                             +---------+---------------+---------+-----------+----------+-------------------+ POP      Full           Yes      Yes                                      +---------+---------------+---------+-----------+----------+-------------------+ PTV      Full                                                             +---------+---------------+---------+-----------+----------+-------------------+ PERO                                                  Not well visualized +---------+---------------+---------+-----------+----------+-------------------+     Summary: RIGHT: - There is no evidence of deep vein thrombosis in the lower extremity. However, portions of this examination were limited- see technologist  comments above.  - No cystic structure found in the popliteal fossa.  LEFT: - There is no evidence of deep vein thrombosis in the lower extremity. However, portions of this examination were limited- see technologist comments above.  - No cystic structure found in the popliteal fossa.  *See table(s) above for measurements and observations. Electronically signed by Debby Robertson on 03/11/2024 at 2:13:13 PM.    Final      Vitals:   03/11/24 1948 03/11/24 2353 03/12/24 0425 03/12/24 0719  BP: (!) 174/58 (!) 162/39 (!) 163/56 (!) 166/52  Pulse: (!) 51 (!) 52 (!) 53 (!) 51  Resp: 17 17  15   Temp: 99.4 F (37.4 C) 97.7 F (36.5 C) 98.3 F (36.8 C) 98 F (36.7 C)  TempSrc: Oral Oral Oral Oral  SpO2: 96% 93% 97% 99%  Weight:      Height:        PHYSICAL EXAM General:  Alert, well-nourished, well-developed patient in no acute distress CV: Regular rate and rhythm on monitor Respiratory:  Regular, unlabored respirations on room air GI: Abdomen soft and nontender   NEURO: awake, alert, eyes open, orientated to place, time, but told me age 41 instead of 62, not quit orientated to situation. No aphasia, paucity of speech but answering questions appropriately, following all simple commands. Able to name 3/4 and repeat simple sentence with some difficulty with complex sentences. No gaze palsy, tracking bilaterally, visual field full, PERRL. No facial droop. Tongue midline. LUE 5/5,  LUE proximal 4/5 due to rotate cuff injury, LLE proximal 3/5 and distally 5/5. RLE proximal 2-/5 due to pain at hip, distally 5/5. Sensation symmetrical bilaterally, b/l FTN intact but slow R>L, gait not tested.     ASSESSMENT/PLAN   [name] is a 73 y.o. 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  Per EMS will check was  called and EMS found him on the floor with a laceration on his head, noted to be confused agitated, with left gaze preference and hypertensive at 240/110. NIH on Admission 18   Stroke s/p TNK - punctate left occipital cortex infarct, etiology: Likely new diagnosed PAF/Aflutter Code Stroke  CT head No acute abnormality. ASPECTS 10.  Old left frontal infarct and chronic ischemic white matter changes  CTA head & neck No LVO Atherosclerotic disease at both carotid bifurcations without hemodynamically significant stenosis  MRI small left superior occipital cortical infarct 2D Echo EF > 75%.  Mild LVH with grade 1 diastolic dysfunction BLE DVT US : Negative for DVT LDL 127 HgbA1c 5.3 VTE prophylaxis - change Xarelto 10mg  daily to Heparin  SQ Q8h No antithrombotic prior to admission, no antithrombotics for now given severe anemia Therapy recommendations:  SNF Disposition: Pending   Atrial tachycardia / aflutter with RVR EKG showed afib with RVR Cardiology on board, concerning for aflutter New diagnosis Telemetry On p.o. amiodarone no antithrombotics for now given severe anemia  S/p unwitnessed fall Acute right femur fracture Pelvis x-ray Right femoral intertrochanteric fracture with displacement and varus angulation. CT chest abdomen pelvis:  Moderately displaced and comminuted intertrochanteric fracture of the proximal right femur, acute. Old bilateral rib fractures, chronic. Chronic L1 vertebral body compression fracture, chronic. Status post appendectomy. Aortic atherosclerosis. Coronary artery calcifications. Diverticulosis of the descending and sigmoid colon without evidence of inflammation. Possible bilateral nephrolithiasis. Ortho consulted S/p Right hip fracture repair on 11//2025 Pain controlled   Severe anemia Hb 15.0--11.0--7.3--7.7 -7.2 -7.1 No overt bleeding CBC monitoring Blood transfusion today x 1  Hypertension Home meds: Amlodipine  5 mg, propranolol  20  mg Stable On bisoprolol  Long-term BP goal normotensive   Hyperlipidemia Home meds: Zetia  10 mg,  resumed in hospital LDL 127, goal < 70 Atorvastatin 40 mg added  Continue both meds at discharge   Substance Abuse History of EtOH abuse History of liver cirrhosis, esophageal varices UDS positive for THC TOC consult for cessation placed   AKI, improving Cr 1.36 - 1.29-1.32 Encourage p.o. intake BMP monitoring  Other Stroke Risk Factors Obesity, Body mass index is 31.95 kg/m., BMI >/= 30 associated with increased stroke risk, recommend weight loss, diet and exercise as appropriate  Silent stroke, Old left frontal infarct on brain imaging CHF Obstructive sleep apnea  Other active problems History of left vocal cord paralyzed this   Patient seen and examined by NP/APP with MD. MD to update note as needed.   Jorene Last, DNP, FNP-BC [facility] Pager: [phone]  ATTENDING NOTE: I reviewed above note and agree with the assessment and plan. Pt was seen and examined.   No family at bedside.  Patient lying bed, initially taking a nap, however easily arousable.  No acute event overnight.  Patient hemoglobin this morning 7.1, down from 11 before the hip surgery, concerning for blood loss anemia, will give 1 unit PRBC  transfusion.  No overt bleeding.  No antithrombotic at this time given severe anemia.  Continue CBC monitoring.  Pending SNF.  For detailed assessment and plan, please refer to above as I have made changes wherever appropriate.   [name] Cummins, MD PhD Stroke Neurology 03/12/2024 6:36 PM     To contact Stroke Continuity provider, please refer to [email or web address]. After hours, contact General Neurology

## 2024-03-12 NOTE — Plan of Care (Signed)
  Problem: Ischemic Stroke/TIA Tissue Perfusion: Goal: Complications of ischemic stroke/TIA will be minimized Outcome: Progressing   Problem: Coping: Goal: Will verbalize positive feelings about self Outcome: Progressing   Problem: Health Behavior/Discharge Planning: Goal: Ability to manage health-related needs will improve Outcome: Progressing   Problem: Self-Care: Goal: Ability to communicate needs accurately will improve Outcome: Progressing   Problem: Nutrition: Goal: Dietary intake will improve Outcome: Progressing   Problem: Clinical Measurements: Goal: Will remain free from infection Outcome: Progressing   Problem: Activity: Goal: Risk for activity intolerance will decrease Outcome: Progressing   Problem: Nutrition: Goal: Adequate nutrition will be maintained Outcome: Progressing   Problem: Pain Managment: Goal: General experience of comfort will improve and/or be controlled Outcome: Progressing   Problem: Safety: Goal: Ability to remain free from injury will improve Outcome: Progressing   Problem: Skin Integrity: Goal: Risk for impaired skin integrity will decrease Outcome: Progressing

## 2024-03-12 NOTE — Discharge Summary (Incomplete)
 Stroke Discharge Summary  Patient ID: [name] [name]   MRN: [id number]      DOB: 1950/11/21  Date of Admission: 03/03/2024 Date of Discharge: 03/14/2024  Attending Physician:  Stroke, Md, MD Consultant(s):    cardiology and orthopedic surgery  Patient's PCP:  [name] Tanda Mae, MD  DISCHARGE PRIMARY DIAGNOSIS:   Stroke s/p TNK - punctate left occipital cortex infarct, etiology: Likely new diagnosed PAF/Aflutter   Secondary diagnosis Atrial tachycardia/A-flutter with RVR Status post unwitnessed fall with acute right femur fracture Severe anemia Hypertension Hyperlipidemia History of alcohol abuse Marijuana user History of liver cirrhosis, esophageal varices AKI Obesity Silent stroke CHF OSA   Allergies as of 03/14/2024       Reactions   Adhesive [tape] Other (See Comments)   REACTION: blisters.  Use paper tape only   Amlodipine  Other (See Comments)   REACTION:  unknown   Amoxicillin Hives   Did it involve swelling of the face/tongue/throat, SOB, or low BP? No Did it involve sudden or severe rash/hives, skin peeling, or any reaction on the inside of your mouth or nose? No Did you need to seek medical attention at a hospital or doctor's office? No When did it last happen? childhood       If all above answers are "NO", may proceed with cephalosporin use.   Atenolol  Other (See Comments)   REACTION:  unknown   Celebrex [celecoxib] Nausea And Vomiting   Also Vioxx   Cephalosporins Hives   Clindamycin/lincomycin Hives   Dexon [dexamethasone  Sodium Phosphate ] Other (See Comments)   REACTION:  unknown   Doxazosin Mesylate Other (See Comments)   REACTION: unknown   Duraprep [antiseptic Products, Misc.]    Enalapril Other (See Comments)   REACTION: dry cough   Erythromycin Hives   Hctz [hydrochlorothiazide ] Other (See Comments)   REACTION: severe sensitivity to light, burning sensation   Iodine    AGENT: Duraprep REACTION:  blisters   Lipitor [atorvastatin]     Other    AGENT:Vicryl, cephalnxon REACTION:  body rejects it   Toprol  Xl [metoprolol  Succinate]    Verapamil Other (See Comments)   REACTION:  Dry cough   Vioxx [rofecoxib] Hives   Zocor [simvastatin] Nausea And Vomiting   Latex Rash        Medication List     STOP taking these medications    amLODipine  5 MG tablet Commonly known as: NORVASC    docusate sodium  100 MG capsule Commonly known as: COLACE   ondansetron  8 MG tablet Commonly known as: ZOFRAN    propranolol  20 MG tablet Commonly known as: INDERAL        TAKE these medications    acetaminophen  325 MG tablet Commonly known as: TYLENOL  Take 1-2 tablets (325-650 mg total) by mouth every 6 (six) hours as needed for mild pain.   amiodarone 200 MG tablet Commonly known as: PACERONE Take 1 tablet (200 mg total) by mouth 2 (two) times daily.   atorvastatin 40 MG tablet Commonly known as: LIPITOR Take 1 tablet (40 mg total) by mouth daily. Start taking on: March 15, 2024   bisoprolol 10 MG tablet Commonly known as: ZEBETA Take 1 tablet (10 mg total) by mouth daily. Start taking on: March 15, 2024   cyclobenzaprine 5 MG tablet Commonly known as: FLEXERIL Take 1 tablet (5 mg total) by mouth 3 (three) times daily as needed for muscle spasms.   ezetimibe  10 MG tablet Commonly known as: ZETIA  Take 1 tablet (10 mg total)  by mouth daily.   HYDROcodone -acetaminophen  7.5-325 MG tablet Commonly known as: NORCO Take 1 tablet by mouth every 4 (four) hours as needed for moderate pain (pain score 4-6).   Oxycodone  HCl 10 MG Tabs Take 1 tablet (10 mg total) by mouth every 4 (four) hours as needed for severe pain (pain score 7-10).   polyethylene glycol 17 g packet Commonly known as: MIRALAX  / GLYCOLAX  Take 17 g by mouth daily. Start taking on: March 15, 2024   rivaroxaban 10 MG Tabs tablet Commonly known as: XARELTO Take 1 tablet (10 mg total) by mouth daily.   senna-docusate 8.6-50 MG  tablet Commonly known as: Senokot-S Take 1 tablet by mouth at bedtime as needed for mild constipation.        LABORATORY STUDIES CBC    Component Value Date/Time   WBC 13.0 (H) 03/14/2024 0111   RBC 2.63 (L) 03/14/2024 0111   HGB 8.2 (L) 03/14/2024 0111   HCT 25.7 (L) 03/14/2024 0111   PLT 338 03/14/2024 0111   MCV 97.7 03/14/2024 0111   MCH 31.2 03/14/2024 0111   MCHC 31.9 03/14/2024 0111   RDW 14.2 03/14/2024 0111   LYMPHSABS 1.0 03/03/2024 1546   MONOABS 1.1 (H) 03/03/2024 1546   EOSABS 0.0 03/03/2024 1546   BASOSABS 0.1 03/03/2024 1546   CMP    Component Value Date/Time   NA 134 (L) 03/14/2024 0111   NA 137 10/07/2023 1311   K 4.5 03/14/2024 0111   CL 104 03/14/2024 0111   CO2 21 (L) 03/14/2024 0111   GLUCOSE 120 (H) 03/14/2024 0111   BUN 26 (H) 03/14/2024 0111   BUN 22 10/07/2023 1311   CREATININE 1.19 03/14/2024 0111   CALCIUM 8.1 (L) 03/14/2024 0111   PROT 5.4 (L) 03/10/2024 2253   ALBUMIN  2.5 (L) 03/11/2024 0102   AST 97 (H) 03/10/2024 2253   ALT 105 (H) 03/10/2024 2253   ALKPHOS 98 03/10/2024 2253   BILITOT 1.1 03/10/2024 2253   GFRNONAA >60 03/14/2024 0111   GFRAA >60 09/20/2019 1026   COAGS Lab Results  Component Value Date   INR 1.0 03/03/2024   INR 1.0 07/10/2014   INR 1.21 02/10/2014   Lipid Panel    Component Value Date/Time   CHOL 195 03/04/2024 0241   TRIG 157 (H) 03/04/2024 0241   HDL 37 (L) 03/04/2024 0241   CHOLHDL 5.3 03/04/2024 0241   VLDL 31 03/04/2024 0241   LDLCALC 127 (H) 03/04/2024 0241   HgbA1C  Lab Results  Component Value Date   HGBA1C 5.3 03/03/2024   Alcohol Level    Component Value Date/Time   [facility] <15 03/03/2024 1546     SIGNIFICANT DIAGNOSTIC STUDIES VAS US  LOWER EXTREMITY VENOUS (DVT) Result Date: 03/11/2024  Lower Venous DVT Study Patient Name:  [name] [name]  Date of Exam:   03/11/2024 Medical Rec #: [id number]      Accession #:    [id number] Date of Birth: [birth year]      Patient Gender: M Patient Age:    73 years Exam Location:  [facility] Procedure:      VAS US  LOWER EXTREMITY VENOUS (DVT) Referring Phys: ARY XU --------------------------------------------------------------------------------  Indications: Stroke.  Risk Factors: None identified. Limitations: Poor ultrasound/tissue interface and body habitus. Comparison Study: No prior studies. Performing Technologist: Cordella Collet RVT  Examination Guidelines: A complete evaluation includes B-mode imaging, spectral Doppler, color Doppler, and power Doppler as needed of all accessible portions of each vessel. Bilateral testing is considered an  integral part of a complete examination. Limited examinations for reoccurring indications may be performed as noted. The reflux portion of the exam is performed with the patient in reverse Trendelenburg.  +---------+---------------+---------+-----------+----------+-------------------+ RIGHT    CompressibilityPhasicitySpontaneityPropertiesThrombus Aging      +---------+---------------+---------+-----------+----------+-------------------+ CFV      Full           Yes      Yes                                      +---------+---------------+---------+-----------+----------+-------------------+ SFJ      Full                                                             +---------+---------------+---------+-----------+----------+-------------------+ FV Prox  Full                                                             +---------+---------------+---------+-----------+----------+-------------------+ FV Mid   Full                                                             +---------+---------------+---------+-----------+----------+-------------------+ FV DistalFull           Yes      Yes                                      +---------+---------------+---------+-----------+----------+-------------------+ PFV      Full                                                              +---------+---------------+---------+-----------+----------+-------------------+ POP      Full           Yes      Yes                                      +---------+---------------+---------+-----------+----------+-------------------+ PTV      Full                                                             +---------+---------------+---------+-----------+----------+-------------------+ PERO  Not well visualized +---------+---------------+---------+-----------+----------+-------------------+   +---------+---------------+---------+-----------+----------+-------------------+ LEFT     CompressibilityPhasicitySpontaneityPropertiesThrombus Aging      +---------+---------------+---------+-----------+----------+-------------------+ CFV      Full           Yes      Yes                                      +---------+---------------+---------+-----------+----------+-------------------+ SFJ      Full                                                             +---------+---------------+---------+-----------+----------+-------------------+ FV Prox  Full                                                             +---------+---------------+---------+-----------+----------+-------------------+ FV Mid   Full                                                             +---------+---------------+---------+-----------+----------+-------------------+ FV DistalFull                                                             +---------+---------------+---------+-----------+----------+-------------------+ PFV      Full                                                             +---------+---------------+---------+-----------+----------+-------------------+ POP      Full           Yes      Yes                                      +---------+---------------+---------+-----------+----------+-------------------+  PTV      Full                                                             +---------+---------------+---------+-----------+----------+-------------------+ PERO                                                  Not well visualized +---------+---------------+---------+-----------+----------+-------------------+  Summary: RIGHT: - There is no evidence of deep vein thrombosis in the lower extremity. However, portions of this examination were limited- see technologist comments above.  - No cystic structure found in the popliteal fossa.  LEFT: - There is no evidence of deep vein thrombosis in the lower extremity. However, portions of this examination were limited- see technologist comments above.  - No cystic structure found in the popliteal fossa.  *See table(s) above for measurements and observations. Electronically signed by Debby Robertson on 03/11/2024 at 2:13:13 PM.    Final    DG FEMUR, MIN 2 VIEWS RIGHT Result Date: 03/06/2024 EXAM: 2 VIEW(S) XRAY OF THE RIGHT FEMUR 03/06/2024 07:48:43 PM COMPARISON: 03/03/2024 CLINICAL HISTORY: [id number] Post-operative state [id number] Post-operative state FINDINGS: BONES AND JOINTS: Internal fixation is present across the right femoral intertrochanteric fracture with near anatomic alignment. No hardware complicating feature. No joint dislocation. SOFT TISSUES: The soft tissues are unremarkable. IMPRESSION: 1. Internal fixation across the right femoral intertrochanteric fracture with near anatomic alignment and no hardware complication. Electronically signed by: Franky Crease MD 03/06/2024 09:29 PM EST RP Workstation: [id number]   DG FEMUR, MIN 2 VIEWS RIGHT Result Date: 03/06/2024 EXAM: Multiple VIEW(S) XRAY OF THE RIGHT FEMUR 03/06/2024 06:30:00 PM COMPARISON: None available. CLINICAL HISTORY: Surgery, elective [id number]. FINDINGS: BONES AND JOINTS: Multiple intraoperative spot images demonstrate placement of an intramedullary nail and dynamic screw across the right  femoral intertrochanteric fracture. Anatomic alignment. No hardware complications. SOFT TISSUES: The soft tissues are unremarkable. IMPRESSION: 1. Placement of intramedullary nail and dynamic screw across the right femoral intertrochanteric fracture with anatomic alignment and no hardware complications. Electronically signed by: Franky Crease MD 03/06/2024 09:28 PM EST RP Workstation: [id number]   DG C-Arm 1-60 Min-No Report Result Date: 03/06/2024 Fluoroscopy was utilized by the requesting physician.  No radiographic interpretation.   DG C-Arm 1-60 Min-No Report Result Date: 03/06/2024 Fluoroscopy was utilized by the requesting physician.  No radiographic interpretation.   DG Knee 1-2 Views Right Result Date: 03/04/2024 CLINICAL DATA:  Right knee pain EXAM: RIGHT KNEE - 2 VIEW COMPARISON:  None Available. FINDINGS: There are no findings of fracture or dislocation. No joint effusion. 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 Vascular calcifications. Soft tissues are unremarkable. 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 Electronically Signed   By: Limin  Xu M.D.   On: 03/04/2024 17:35   MR BRAIN WO CONTRAST Result Date: 03/04/2024 EXAM: MRI BRAIN WITHOUT CONTRAST 03/04/2024 11:55:56 AM TECHNIQUE: Multiplanar multisequence MRI of the head/brain was performed without the administration of intravenous contrast. COMPARISON: CT head and CTA head and neck yesterday. CLINICAL HISTORY: 73 year old male. Code stroke presentation yesterday. FINDINGS: BRAIN AND VENTRICLES: Small cortically based infarct with restricted diffusion in the superior left occipital pole (series 3 image 31). Faint if any associated cytotoxic edema. No other diffusion restriction. Small chronic infarcts scattered in the right cerebellar hemisphere, in the brainstem (especially the left pons), and in the bilateral  deep gray matter nuclei. Small area of chronic cortical encephalomalacia left anterior superior frontal gyrus (series 6 image 23). Patchy moderate additional bilateral white matter T2 and FLAIR hyperintensity, with asymmetric left corona radiata involvement. No chronic cerebral blood products on T2*. No acute intracranial hemorrhage. No mass effect. No midline shift. No hydrocephalus. The sella is unremarkable. Normal flow voids. ORBITS: Postoperative changes to both globes. SINUSES AND MASTOIDS: Trace mastoid air cell effusions, appear inconsequential. BONES AND  SOFT TISSUES: Normal marrow signal. Normal visible cervical spine. No acute soft tissue abnormality. IMPRESSION: 1. Very Small acute cortical infarct left superior occipital lobe. No intracranial hemorrhage or mass effect. 2. Chronic small and medium sized vessel ischemia, moderate to severe for age. Electronically signed by: Helayne Hurst MD 03/04/2024 01:44 PM EST RP Workstation: [id number]   ECHOCARDIOGRAM COMPLETE Result Date: 03/04/2024    ECHOCARDIOGRAM REPORT   Patient Name:   ASIA FAVATA Date of Exam: 03/04/2024 Medical Rec #:  [id number]     Height:       66.0 in Accession #:    [id number]    Weight:       198.0 lb Date of Birth:  Jan 24, 1951     BSA:          1.991 m Patient Age:    73 years      BP:           163/99 mmHg Patient Gender: M             HR:           103 bpm. Exam Location:  Inpatient Procedure: 2D Echo (Both Spectral and Color Flow Doppler were utilized during            procedure). Indications:    stroke  History:        Patient has prior history of Echocardiogram examinations, most                 recent 08/09/2023. Cirrhosis; Risk Factors:Hypertension,                 Dyslipidemia and Sleep Apnea.  Sonographer:    Tinnie Barefoot RDCS Referring Phys: [id number] ASHISH ARORA  Sonographer Comments: Technically difficult study due to poor echo windows and suboptimal subcostal window. Image acquisition challenging due to respiratory  motion. IMPRESSIONS  1. Left ventricular ejection fraction, by estimation, is >75%. The left ventricle has hyperdynamic function. The left ventricle has no regional wall motion abnormalities. There is mild left ventricular hypertrophy. Left ventricular diastolic parameters are consistent with Grade I diastolic dysfunction (impaired relaxation).  2. Right ventricular systolic function is normal. The right ventricular size is normal.  3. The mitral valve is normal in structure. No evidence of mitral valve regurgitation. No evidence of mitral stenosis. The mean mitral valve gradient is 3.7 mmHg. Moderate mitral annular calcification.  4. The aortic valve is normal in structure. Aortic valve regurgitation is not visualized. No aortic stenosis is present.  5. The inferior vena cava is normal in size with greater than 50% respiratory variability, suggesting right atrial pressure of 3 mmHg. Conclusion(s)/Recommendation(s): No intracardiac source of embolism detected on this transthoracic study. Consider a transesophageal echocardiogram to exclude cardiac source of embolism if clinically indicated. FINDINGS  Left Ventricle: Left ventricular ejection fraction, by estimation, is >75%. The left ventricle has hyperdynamic function. The left ventricle has no regional wall motion abnormalities. Definity contrast agent was given IV to delineate the left ventricular endocardial borders. The left ventricular internal cavity size was normal in size. There is mild left ventricular hypertrophy. Left ventricular diastolic parameters are consistent with Grade I diastolic dysfunction (impaired relaxation). Right Ventricle: The right ventricular size is normal. No increase in right ventricular wall thickness. Right ventricular systolic function is normal. Left Atrium: Left atrial size was normal in size. Right Atrium: Right atrial size was normal in size. Pericardium: There is no evidence of pericardial effusion. Mitral Valve: The mitral  valve is  normal in structure. Moderate mitral annular calcification. No evidence of mitral valve regurgitation. No evidence of mitral valve stenosis. MV peak gradient, 11.5 mmHg. The mean mitral valve gradient is 3.7 mmHg. Tricuspid Valve: The tricuspid valve is normal in structure. Tricuspid valve regurgitation is not demonstrated. No evidence of tricuspid stenosis. Aortic Valve: The aortic valve is normal in structure. Aortic valve regurgitation is not visualized. No aortic stenosis is present. Aortic valve mean gradient measures 13.7 mmHg. Aortic valve peak gradient measures 26.6 mmHg. Aortic valve area, by VTI measures 1.27 cm. Pulmonic Valve: The pulmonic valve was normal in structure. Pulmonic valve regurgitation is not visualized. No evidence of pulmonic stenosis. Aorta: The aortic root is normal in size and structure. Venous: The inferior vena cava is normal in size with greater than 50% respiratory variability, suggesting right atrial pressure of 3 mmHg. IAS/Shunts: No atrial level shunt detected by color flow Doppler.  LEFT VENTRICLE PLAX 2D LVIDd:         3.80 cm   Diastology LVIDs:         2.10 cm   LV e' medial:    6.20 cm/s LV PW:         1.30 cm   LV E/e' medial:  8.3 LV IVS:        1.30 cm   LV e' lateral:   8.38 cm/s LVOT diam:     1.80 cm   LV E/e' lateral: 6.1 LV SV:         44 LV SV Index:   22 LVOT Area:     2.54 cm  RIGHT VENTRICLE RV Basal diam:  2.70 cm RV S prime:     23.00 cm/s LEFT ATRIUM           Index        RIGHT ATRIUM           Index LA diam:      3.60 cm 1.81 cm/m   RA Area:     13.60 cm LA Vol (A4C): 43.5 ml 21.85 ml/m  RA Volume:   30.00 ml  15.07 ml/m  AORTIC VALVE AV Area (Vmax):    1.21 cm AV Area (Vmean):   1.23 cm AV Area (VTI):     1.27 cm AV Vmax:           257.67 cm/s AV Vmean:          164.333 cm/s AV VTI:            0.342 m AV Peak Grad:      26.6 mmHg AV Mean Grad:      13.7 mmHg LVOT Vmax:         123.00 cm/s LVOT Vmean:        79.550 cm/s LVOT VTI:           0.171 m LVOT/AV VTI ratio: 0.50  AORTA Ao Root diam: 3.50 cm MITRAL VALVE MV Area VTI:  1.26 cm      SHUNTS MV Peak grad: 11.5 mmHg     Systemic VTI:  0.17 m MV Mean grad: 3.7 mmHg      Systemic Diam: 1.80 cm MV Vmax:      1.69 m/s MV Vmean:     87.2 cm/s MV E velocity: 51.40 cm/s MV A velocity: 146.00 cm/s MV E/A ratio:  0.35 Oneil Parchment MD Electronically signed by Oneil Parchment MD Signature Date/Time: 03/04/2024/12:54:41 PM    Final    CT CHEST ABDOMEN PELVIS W CONTRAST  Result Date: 03/04/2024 EXAM: CT CHEST, ABDOMEN AND PELVIS WITH CONTRAST 03/04/2024 11:10:43 AM TECHNIQUE: CT of the chest, abdomen and pelvis was performed with the administration of intravenous contrast. Multiplanar reformatted images are provided for review. Automated exposure control, iterative reconstruction, and/or weight based adjustment of the mA/kV was utilized to reduce the radiation dose to as low as reasonably achievable. CONTRAST: 75mL of omnipaque  350. COMPARISON: CT Chest Abdomen Pelvis with contrast 10/21/2019. CLINICAL HISTORY: Polytrauma, blunt. FINDINGS: CHEST: MEDIASTINUM AND LYMPH NODES: Heart and pericardium are unremarkable. Coronary artery calcifications are noted. The central airways are clear. No mediastinal, hilar or axillary lymphadenopathy. LUNGS AND PLEURA: No focal consolidation or pulmonary edema. No pleural effusion or pneumothorax. ABDOMEN AND PELVIS: LIVER: The liver is unremarkable. GALLBLADDER AND BILE DUCTS: Gallbladder is unremarkable. No biliary ductal dilatation. SPLEEN: No acute abnormality. PANCREAS: No acute abnormality. ADRENAL GLANDS: No acute abnormality. KIDNEYS, URETERS AND BLADDER: Possible bilateral nephrolithiasis is noted. No stones in the ureters. No hydronephrosis. No perinephric or periureteral stranding. Urinary bladder is unremarkable. GI AND BOWEL: Stomach demonstrates no acute abnormality. Diverticulosis of descending and sigmoid colon is noted without inflammation. There is no bowel  obstruction. Status post appendectomy. REPRODUCTIVE ORGANS: No acute abnormality. PERITONEUM AND RETROPERITONEUM: No ascites. No free air. VASCULATURE: Aorta is normal in caliber. Aortic atherosclerosis. ABDOMINAL AND PELVIS LYMPH NODES: No lymphadenopathy. BONES AND SOFT TISSUES: Old bilateral rib fractures are noted. Old L1 compression fracture is noted. Moderately displaced and comminuted fracture of intertrochanteric region of proximal right femur is noted. No focal soft tissue abnormality. IMPRESSION: 1. Moderately displaced and comminuted intertrochanteric fracture of the proximal right femur, acute. 2. Old bilateral rib fractures, chronic. 3. Chronic L1 vertebral body compression fracture, chronic. 4. Status post appendectomy. 5. Aortic atherosclerosis. 6. Coronary artery calcifications. 7. Diverticulosis of the descending and sigmoid colon without evidence of inflammation. 8. Possible bilateral nephrolithiasis. Electronically signed by: Lynwood Seip MD 03/04/2024 11:45 AM EST RP Workstation: [id number]   DG Pelvis Portable Result Date: 03/03/2024 EXAM: 1 or 2 VIEW(S) XRAY OF THE PELVIS 03/03/2024 05:20:00 PM COMPARISON: None available. CLINICAL HISTORY: fall fall FINDINGS: BONES AND JOINTS: Right femoral intertrochanteric fracture with displacement and varus angulation. No subluxation or dislocation. Mild symmetric degenerative changes in the hips. SOFT TISSUES: The soft tissues are unremarkable. IMPRESSION: 1. Right femoral intertrochanteric fracture with displacement and varus angulation. Electronically signed by: Franky Crease MD 03/03/2024 05:29 PM EDT RP Workstation: [id number]   DG Chest Portable 1 View Result Date: 03/03/2024 CLINICAL DATA:  Fall. EXAM: PORTABLE CHEST 1 VIEW COMPARISON:  06/30/2018. FINDINGS: The heart is enlarged and the mediastinal contour is within normal limits. Stable interstitial prominence is noted bilaterally. No consolidation, effusion, or pneumothorax is seen. There are  fractures of the T2 through T4 ribs on the right. Old healed rib fractures are noted on the left. IMPRESSION: 1. Fractures of the T2 through T4 ribs on the right. 2. Cardiomegaly. Electronically Signed   By: Leita Birmingham M.D.   On: 03/03/2024 16:53   CT C-SPINE NO CHARGE Result Date: 03/03/2024 EXAM: CT CERVICAL SPINE WITH CONTRAST 03/03/2024 04:14:17 PM TECHNIQUE: CT of the cervical spine was performed with the administration of 75 mL of iohexol  (OMNIPAQUE ) 350 MG/ML injection. The cervical spine images were reconstructed from the concomitant CTA of the neck. Multiplanar reformatted images are provided for review. Automated exposure control, iterative reconstruction, and/or weight based adjustment of the mA/kV was utilized to reduce the radiation dose to as low as reasonably achievable. COMPARISON: None  available. CLINICAL HISTORY: FINDINGS: CERVICAL SPINE: BONES AND ALIGNMENT: No acute fracture or traumatic malalignment. DEGENERATIVE CHANGES: C5-C6 and C6-C7 disc space narrowing without spinal canal stenosis. Bulky right anterior osteophytes at the C5-C7 levels. Upper cervical predominant facet arthrosis. SOFT TISSUES: No prevertebral soft tissue swelling. IMPRESSION: 1. No acute cervical spinal abnormality. Electronically signed by: Franky Stanford MD 03/03/2024 04:26 PM EDT RP Workstation: [id number]   CT ANGIO HEAD NECK W WO CM (CODE STROKE) Result Date: 03/03/2024 EXAM: CTA Head and Neck with Intravenous Contrast. CT Head without Contrast. CLINICAL HISTORY: Neuro deficit, acute, stroke suspected. TECHNIQUE: Axial CTA images of the head and neck performed with intravenous contrast. MIP reconstructed images were created and reviewed. Axial computed tomography images of the head/brain performed without intravenous contrast. Note: Per PQRS, the description of internal carotid artery percent stenosis, including 0 percent or normal exam, is based on [place] [employer] Symptomatic Carotid Endarterectomy Trial  (NASCET) criteria. Dose reduction technique was used including one or more of the following: automated exposure control, adjustment of mA and kV according to patient size, and/or iterative reconstruction. CONTRAST: Without and with; 75 mL iohexol  (OMNIPAQUE ) 350 MG/ML injection. COMPARISON: None provided. FINDINGS: CT HEAD: BRAIN: No acute intraparenchymal hemorrhage. No mass lesion. No CT evidence for acute territorial infarct. No midline shift or extra-axial collection. Intracranial images are mildly degraded by motion. VENTRICLES: No hydrocephalus. ORBITS: The orbits are unremarkable. SINUSES AND MASTOIDS: The paranasal sinuses and mastoid air cells are clear. CTA NECK: COMMON CAROTID ARTERIES: Mixed density atherosclerotic disease at both carotid bifurcations without hemodynamically significant stenosis. No dissection or occlusion. INTERNAL CAROTID ARTERIES: Mixed density atherosclerotic disease at both carotid bifurcations without hemodynamically significant stenosis. No dissection or occlusion. VERTEBRAL ARTERIES: Codominant vertebral arteries. Mild narrowing of the right vertebral artery origin and minimal atherosclerotic calcification of both V4 segments. Vertebral arteries otherwise normal. No dissection or occlusion. CTA HEAD: ANTERIOR CEREBRAL ARTERIES: No significant stenosis. No occlusion. No aneurysm. MIDDLE CEREBRAL ARTERIES: No significant stenosis. No occlusion. No aneurysm. POSTERIOR CEREBRAL ARTERIES: No significant stenosis. No occlusion. No aneurysm. BASILAR ARTERY: No significant stenosis. No occlusion. No aneurysm. OTHER: Atherosclerotic calcification of the cavernous segments of both internal carotid arteries without high grade stenosis. SOFT TISSUES: No acute finding. No masses or lymphadenopathy. BONES: Calcific aortic atherosclerosis. No acute osseous abnormality. IMPRESSION: 1. No emergent large vessel occlusion. 2. Atherosclerotic disease at both carotid bifurcations without  hemodynamically significant stenosis. Electronically signed by: Franky Stanford MD 03/03/2024 04:24 PM EDT RP Workstation: [id number]   CT HEAD CODE STROKE WO CONTRAST Result Date: 03/03/2024 EXAM: CT HEAD WITHOUT CONTRAST 03/03/2024 03:55:48 PM TECHNIQUE: CT of the head was performed without the administration of intravenous contrast. Automated exposure control, iterative reconstruction, and/or weight based adjustment of the mA/kV was utilized to reduce the radiation dose to as low as reasonably achievable. COMPARISON: 10/02/2019 CLINICAL HISTORY: Neuro deficit, acute, stroke suspected. FINDINGS: BRAIN AND VENTRICLES: No acute hemorrhage. No evidence of acute infarct. No hydrocephalus. No extra-axial collection. No mass effect or midline shift. Chronic ischemic white matter changes. Old left frontal infarct. [place] Stroke Program Early CT Score (ASPECTS) ----- Ganglionic (caudate, ic, lentiform nucleus, insula, M1-m3): 7 Supraganglionic (m4-m6): 3 Total: 10 ORBITS: No acute abnormality. SINUSES: No acute abnormality. SOFT TISSUES AND SKULL: No acute soft tissue abnormality. No skull fracture. IMPRESSION: 1. No acute intracranial abnormality. 2. Old left frontal infarct and chronic ischemic white matter changes. 3. ASPECTS: 10. Findings communicated to Dr. Ashish Arora at 4:06 pm on 03/03/24 Electronically signed by:  Franky Stanford MD 03/03/2024 04:11 PM EDT RP Workstation: [id number]       HISTORY OF PRESENT ILLNESS 73 y.o. patient with history of alcohol abuse, cirrhosis, CHF, esophageal varices, GERD, sleep apnea, hypertension, hyperlipidemia and left vocal cord paralysis was admitted after being found down at home with confusion agitation and left gaze preference.  HOSPITAL COURSE Patient was given TNK to treat possible stroke.  He was found on MRI to have a small left superior occipital cortical infarct, which is likely incidental.  He did have a right femur fracture from his fall and underwent repair on  11/4.  He was also found to have a flutter and was placed on amiodarone.  Anticoagulants were held due to severe anemia, for which patient received 1 unit of packed red blood cells.  He will receive Xarelto 10 mg daily as postop DVT prophylaxis and will follow-up with cardiology for decision on anticoagulation within a month.  He is now stable and ready to be discharged to facility for rehab.  Stroke s/p TNK - punctate left occipital cortex infarct, etiology: Likely new diagnosed PAF/aflutter Code Stroke  CT head No acute abnormality. ASPECTS 10.  Old left frontal infarct and chronic ischemic white matter changes  CTA head & neck No LVO Atherosclerotic disease at both carotid bifurcations without hemodynamically significant stenosis  MRI small left superior occipital cortical infarct 2D Echo EF > 75%.  Mild LVH with grade 1 diastolic dysfunction BLE DVT US : Negative for DVT LDL 127 HgbA1c 5.3 VTE prophylaxis - change Xarelto 10mg  daily to Heparin  SQ Q8h No antithrombotic prior to admission, now on Xarelto 10 daily for DVT prophylaxis for 30-day course post hip surgery. Will follow up with cardiology within one month to decide on antithrombotic regimen.  Therapy recommendations:  SNF Disposition: Pending   PAF / Aflutter with RVR EKG showed afib with RVR Cardiology on board, concerning for aflutter New diagnosis Telemetry On p.o. amiodarone now on Xarelto 10 daily for DVT prophylaxis for 30-day course post hip surgery. Will follow up with cardiology within one month to decide on antithrombotic regimen.   S/p unwitnessed fall Acute right femur fracture Pelvis x-ray Right femoral intertrochanteric fracture with displacement and varus angulation. CT chest abdomen pelvis:  Moderately displaced and comminuted intertrochanteric fracture of the proximal right femur, acute. Old bilateral rib fractures, chronic. Chronic L1 vertebral body compression fracture, chronic. Status post  appendectomy. Aortic atherosclerosis. Coronary artery calcifications. Diverticulosis of the descending and sigmoid colon without evidence of inflammation. Possible bilateral nephrolithiasis. Ortho consulted S/p Right hip fracture repair on 11//2025 Pain controlled   Severe anemia Hb 15.0--11.0--7.3--7.7--8.2 No overt bleeding CBC monitoring Received a transfusion of 1 unit of packed red blood cells 11/11   Hypertension Home meds: Amlodipine  5 mg, propranolol  20 mg Stable On bisoprolol  Blood Pressure Goal: BP less than 180/105  Long-term BP goal normotensive   Hyperlipidemia Home meds: Zetia  10 mg,  resumed in hospital LDL 127, goal < 70 Atorvastatin 40 mg added  Continue both meds at discharge   Substance Abuse History of EtOH abuse History of liver cirrhosis, esophageal varices UDS positive for THC TOC consult for cessation placed   AKI, improving Cr 1.36 - 1.29 - 1.19 Encourage p.o. intake BMP monitoring   Other Stroke Risk Factors Obesity, Body mass index is 31.95 kg/m., BMI >/= 30 associated with increased stroke risk, recommend weight loss, diet and exercise as appropriate  Silent stroke, Old left frontal infarct on brain imaging CHF Obstructive sleep  apnea   Other active problems History of left vocal cord paralyzed this   DISCHARGE EXAM  PHYSICAL EXAM General:  Alert, well-nourished, well-developed patient in no acute distress Psych:  Mood and affect appropriate for situation Respiratory:  Regular, unlabored respirations on room air  NEURO:  Mental Status: AA&Ox3  Speech/Language: speech is without dysarthria or aphasia.    Cranial Nerves:  II: PERRL. Visual fields full.  III, IV, VI: EOMI. Eyelids elevate symmetrically.  V: Sensation is intact to light touch and symmetrical to face.  VII: Smile is symmetrical.  VIII: hearing intact to voice. IX, X: Phonation is normal.  KP:Dynloizm shrug 5/5. XII: tongue is midline without  fasciculations. Motor: Able to move bilateral upper extremities with good antigravity strength, moves bilateral lower extremities but cannot lift them off the bed Tone: is normal and bulk is normal Sensation- Intact to light touch bilaterally.  Coordination: FTN intact bilaterally Gait- deferred  1a Level of Conscious.: 0 1b LOC Questions: 0 1c LOC Commands: 0 2 Best Gaze: 0 3 Visual: 0 4 Facial Palsy: 0 5a Motor Arm - left: 0 5b Motor Arm - Right: 0 6a Motor Leg - Left: 3 6b Motor Leg - Right: 3 7 Limb Ataxia: 0 8 Sensory: 0 9 Best Language: 0 10 Dysarthria: 0 11 Extinct. and Inatten.: 0 TOTAL: 6   Discharge Diet       Diet   Diet regular Fluid consistency: Thin   liquids  DISCHARGE PLAN Disposition: Skilled nursing facility Continue Xarelto 10 mg daily for 30 days as postop DVT prophylaxis, then discuss further anticoagulation with cardiology Ongoing stroke risk factor control by Primary Care Physician at time of discharge Follow-up PCP [name] Tanda Mae, MD in 2 weeks. Follow up with [facility] Dr. Santo  Follow-up in [facility] in 8 weeks, office to schedule an appointment.   35 minutes were spent preparing discharge.  Cortney E Everitt Clint Kill , MSN, AGACNP-BC [facility] See Amion for schedule and pager information 03/14/2024 11:51 AM   ATTENDING NOTE: I reviewed above note and agree with the assessment and plan. Pt was seen and examined.   No acute event overnight.  Neuro stable.  Hemoglobin 8.2, improved after PRBC transfusion.  Change DVT prophylaxis from heparin  subcu to Xarelto 10 daily to complete total 30-day course after hip surgery.  Recommend follow-up with cardiology within 1 months to discuss about antithrombotic therapy given new diagnosed A-flutter.  Patient medically ready for SNF discharge.  For detailed assessment and plan, please refer to above as I have made changes wherever appropriate.    Ary Cummins, MD PhD Stroke Neurology 03/14/2024 2:46 PM

## 2024-03-12 NOTE — Progress Notes (Signed)
 Physical Therapy Treatment Patient Details Name: [name] [name] MRN: [id number] DOB: 05/23/1950 Today's Date: 03/12/2024   History of Present Illness Pt is a 73 y.o. male presenting 11/1 as code stroke after being found down with laceration to head, confused, agitated, L gaze preference, R weakness. S/p TNK. MRI shows small left occipital infarct. Also found to have R hip fx, s/p ORIF 11/4. PMH:  alcohol abuse, CHF, cirrhosis of the liver, esophageal varices, GERD, OSA, HTN, HLD, sleep apnea, and vocal cord paralysis on the left    PT Comments  Pt received in supine and agreeable to session. Pain appears slightly improved allowing for improved activity tolerance. Pt continues to be limited by pain and quick fatigue. Pt able to stand and pivot to recliner with mod A +2 and heavy reliance on RW support to offload RLE. Pt not oriented to situation and is reoriented during session. Education provided on supine exercises to reduce stiffness and increase strength. Pt continues to benefit from PT services to progress toward functional mobility goals.    If plan is discharge home, recommend the following: A lot of help with walking and/or transfers;A lot of help with bathing/dressing/bathroom;Assistance with cooking/housework;Assist for transportation;Help with stairs or ramp for entrance   Can travel by private vehicle        Equipment Recommendations  BSC/3in1    Recommendations for Other Services       Precautions / Restrictions Precautions Precautions: Fall Recall of Precautions/Restrictions: Impaired Restrictions Weight Bearing Restrictions Per Provider Order: Yes RLE Weight Bearing Per Provider Order: Weight bearing as tolerated     Mobility  Bed Mobility Overal bed mobility: Needs Assistance Bed Mobility: Supine to Sit     Supine to sit: Mod assist, +2 for safety/equipment, HOB elevated, Used rails     General bed mobility comments: increased time and effort. Assist to offload  RLE for advancement, trunk elevation, and scooting forward to EOB    Transfers Overall transfer level: Needs assistance Equipment used: Rolling walker (2 wheels) Transfers: Sit to/from Stand, Bed to chair/wheelchair/BSC Sit to Stand: Mod assist, +2 physical assistance   Step pivot transfers: Mod assist, +2 physical assistance, +2 safety/equipment       General transfer comment: Cues for hand placement. Assist for power up and balance    Ambulation/Gait               General Gait Details: unable to progress   Stairs             Wheelchair Mobility     Tilt Bed    Modified Rankin (Stroke Patients Only) Modified Rankin (Stroke Patients Only) Pre-Morbid Rankin Score: No symptoms Modified Rankin: Moderately severe disability     Balance Overall balance assessment: Needs assistance, History of Falls Sitting-balance support: Feet supported, No upper extremity supported, Bilateral upper extremity supported Sitting balance-Leahy Scale: Fair Sitting balance - Comments: CGA  sitting EOB   Standing balance support: Bilateral upper extremity supported, During functional activity, Reliant on assistive device for balance Standing balance-Leahy Scale: Poor Standing balance comment: reliant on RW and external support                            Communication Communication Communication: Impaired Factors Affecting Communication: Hearing impaired  Cognition Arousal: Alert Behavior During Therapy: WFL for tasks assessed/performed   PT - Cognitive impairments: Awareness, Attention, Sequencing, Problem solving, Safety/Judgement, Memory, Orientation  PT - Cognition Comments: Pt demonstrates impaired memory and asks what is wrong with RLE. Following commands: Impaired Following commands impaired: Follows one step commands with increased time, Follows multi-step commands inconsistently    Cueing Cueing Techniques: Verbal cues,  Tactile cues, Visual cues  Exercises General Exercises - Lower Extremity Long Arc Quad: AROM, Seated, Right, 5 reps Heel Slides: AAROM, Supine, Right, 5 reps    General Comments General comments (skin integrity, edema, etc.): Drainage noted from both RLE bandages, RN notified      Pertinent Vitals/Pain Pain Assessment Pain Assessment: Faces Faces Pain Scale: Hurts even more Pain Location: R hip with movement Pain Descriptors / Indicators: Aching, Discomfort, Grimacing, Crying Pain Intervention(s): Limited activity within patient's tolerance, Monitored during session, Repositioned, Premedicated before session     PT Goals (current goals can now be found in the care plan section) Acute Rehab PT Goals Patient Stated Goal: to go to rehab PT Goal Formulation: With patient Time For Goal Achievement: 03/21/24 Progress towards PT goals: Progressing toward goals    Frequency    Min 2X/week      PT Plan      Co-evaluation PT/OT/SLP Co-Evaluation/Treatment: Yes Reason for Co-Treatment: For patient/therapist safety;To address functional/ADL transfers PT goals addressed during session: Mobility/safety with mobility;Balance;Proper use of DME        AM-PAC PT 6 Clicks Mobility   Outcome Measure  Help needed turning from your back to your side while in a flat bed without using bedrails?: A Lot Help needed moving from lying on your back to sitting on the side of a flat bed without using bedrails?: A Lot Help needed moving to and from a bed to a chair (including a wheelchair)?: Total Help needed standing up from a chair using your arms (e.g., wheelchair or bedside chair)?: Total Help needed to walk in hospital room?: Total Help needed climbing 3-5 steps with a railing? : Total 6 Click Score: 8    End of Session Equipment Utilized During Treatment: Gait belt Activity Tolerance: Patient tolerated treatment well Patient left: in chair;with call bell/phone within reach;with chair  alarm set Nurse Communication: Mobility status;Need for lift equipment PT Visit Diagnosis: Other abnormalities of gait and mobility (R26.89);Unsteadiness on feet (R26.81);Muscle weakness (generalized) (M62.81);History of falling (Z91.81)     Time: 9044-8980 PT Time Calculation (min) (ACUTE ONLY): 24 min  Charges:    $Therapeutic Activity: 8-22 mins PT General Charges $$ ACUTE PT VISIT: 1 Visit                     Darryle George, PTA Acute Rehabilitation Services Secure Chat Preferred  [phone]    Darryle George 03/12/2024, 10:46 AM

## 2024-03-12 NOTE — Progress Notes (Signed)
 Occupational Therapy Treatment Patient Details Name: [name] [name] MRN: [id number] DOB: April 23, 1951 Today's Date: 03/12/2024   History of present illness Pt is a 73 y.o. male presenting 11/1 as code stroke after being found down with laceration to head, confused, agitated, L gaze preference, R weakness. S/p TNK. MRI shows small left occipital infarct. Also found to have R hip fx, s/p ORIF 11/4. PMH:  alcohol abuse, CHF, cirrhosis of the liver, esophageal varices, GERD, OSA, HTN, HLD, sleep apnea, and vocal cord paralysis on the left   OT comments  Patient received in supine and agreeable to OT/PT treatment. Patient demonstrating good gains with bed mobility and transfers with decreased complaints of pain but pain continues to limit patient with mobility and progress. Patient appears motivated towards progress.  Patient will benefit from intensive inpatient follow-up therapy, >3 hours/day. Acute OT to continue to follow to address established goals to facilitate DC to next venue of care.        If plan is discharge home, recommend the following:  Two people to help with walking and/or transfers;A lot of help with bathing/dressing/bathroom;Two people to help with bathing/dressing/bathroom;Assistance with cooking/housework;Assist for transportation;Help with stairs or ramp for entrance;Direct supervision/assist for medications management;Direct supervision/assist for financial management   Equipment Recommendations  Other (comment) (defer)    Recommendations for Other Services      Precautions / Restrictions Precautions Precautions: Fall Recall of Precautions/Restrictions: Impaired Restrictions Weight Bearing Restrictions Per Provider Order: Yes RLE Weight Bearing Per Provider Order: Weight bearing as tolerated       Mobility Bed Mobility Overal bed mobility: Needs Assistance Bed Mobility: Supine to Sit     Supine to sit: Mod assist, +2 for safety/equipment, HOB elevated, Used rails      General bed mobility comments: increased time and effort. Assist to offload RLE for advancement, trunk elevation, and scooting forward to EOB    Transfers Overall transfer level: Needs assistance Equipment used: Rolling walker (2 wheels) Transfers: Sit to/from Stand, Bed to chair/wheelchair/BSC Sit to Stand: Mod assist, +2 physical assistance     Step pivot transfers: Mod assist, +2 physical assistance, +2 safety/equipment     General transfer comment: Cues for hand placement. Assist for power up and balance     Balance Overall balance assessment: Needs assistance, History of Falls Sitting-balance support: Feet supported, No upper extremity supported, Bilateral upper extremity supported Sitting balance-Leahy Scale: Fair Sitting balance - Comments: CGA  sitting EOB   Standing balance support: Bilateral upper extremity supported, During functional activity, Reliant on assistive device for balance Standing balance-Leahy Scale: Poor Standing balance comment: reliant on RW and external support                           ADL either performed or assessed with clinical judgement   ADL Overall ADL's : Needs assistance/impaired     Grooming: Wash/dry hands;Wash/dry face;Oral care;Set up;Sitting                                      Extremity/Trunk Assessment              Vision       Perception     Praxis     Communication Communication Communication: Impaired Factors Affecting Communication: Hearing impaired   Cognition Arousal: Alert Behavior During Therapy: WFL for tasks assessed/performed Cognition: Cognition impaired   Orientation  impairments: Time, Situation Awareness: Intellectual awareness intact, Online awareness impaired Memory impairment (select all impairments): Short-term memory, Declarative long-term memory Attention impairment (select first level of impairment): Sustained attention, Selective attention Executive  functioning impairment (select all impairments): Problem solving, Organization, Sequencing OT - Cognition Comments: patient asked, what did I do to my leg to make it hurt                 Following commands: Impaired Following commands impaired: Follows one step commands with increased time, Follows multi-step commands inconsistently      Cueing   Cueing Techniques: Verbal cues, Tactile cues, Visual cues  Exercises      Shoulder Instructions       General Comments Drainage noted from both RLE bandages, RN notified    Pertinent Vitals/ Pain       Pain Assessment Pain Assessment: Faces Faces Pain Scale: Hurts even more Pain Location: R hip with movement Pain Descriptors / Indicators: Aching, Discomfort, Grimacing, Crying Pain Intervention(s): Limited activity within patient's tolerance, Monitored during session, Premedicated before session, Repositioned  Home Living                                          Prior Functioning/Environment              Frequency  Min 2X/week        Progress Toward Goals  OT Goals(current goals can now be found in the care plan section)  Progress towards OT goals: Progressing toward goals  Acute Rehab OT Goals Patient Stated Goal: less pain OT Goal Formulation: With patient Time For Goal Achievement: 03/21/24 Potential to Achieve Goals: Good ADL Goals Pt Will Perform Grooming: with set-up;sitting Pt Will Perform Lower Body Dressing: with adaptive equipment;sit to/from stand;with min assist Pt Will Transfer to Toilet: with min assist;stand pivot transfer Pt/caregiver will Perform Home Exercise Program: Both right and left upper extremity;With written HEP provided;Increased strength Additional ADL Goal #1: pt will follow 3 step commands during BADL with min cues  Plan      Co-evaluation    PT/OT/SLP Co-Evaluation/Treatment: Yes Reason for Co-Treatment: For patient/therapist safety;To address  functional/ADL transfers PT goals addressed during session: Mobility/safety with mobility;Balance;Proper use of DME OT goals addressed during session: ADL's and self-care      AM-PAC OT 6 Clicks Daily Activity     Outcome Measure   Help from another person eating meals?: A Little Help from another person taking care of personal grooming?: A Little Help from another person toileting, which includes using toliet, bedpan, or urinal?: Total Help from another person bathing (including washing, rinsing, drying)?: A Lot Help from another person to put on and taking off regular upper body clothing?: A Little Help from another person to put on and taking off regular lower body clothing?: Total 6 Click Score: 13    End of Session Equipment Utilized During Treatment: Gait belt;Rolling walker (2 wheels)  OT Visit Diagnosis: Unsteadiness on feet (R26.81);Muscle weakness (generalized) (M62.81);History of falling (Z91.81);Other symptoms and signs involving cognitive function;Low vision, both eyes (H54.2)   Activity Tolerance Patient tolerated treatment well   Patient Left in chair;with call bell/phone within reach;with bed alarm set   Nurse Communication Mobility status;Need for lift equipment        Time: [phone] OT Time Calculation (min): 24 min  Charges: OT General Charges $OT Visit: 1 Visit OT Treatments $  Self Care/Home Management : 8-22 mins  Dick Laine, OTA Acute Rehabilitation Services  Office [phone]   Jeb LITTIE Laine 03/12/2024, 1:09 PM

## 2024-03-12 NOTE — TOC Progression Note (Signed)
 Transition of Care [facility]) - Progression Note    Patient Details  Name: [name] [name] MRN: [id number] Date of Birth: [birth year]  Transition of Care [facility]) CM/SW Contact  Almarie CHRISTELLA Goodie, [place] Phone Number: 03/12/2024, 10:38 AM  Clinical Narrative:   Referral to [facility] remains pending. CSW sent another request to [facility] to ask them to review referral. CSW to follow.    Expected Discharge Plan: Skilled Nursing Facility Barriers to Discharge: Continued Medical Work up, English As A Second Language Teacher               Expected Discharge Plan and Services     Post Acute Care Choice: Skilled Nursing Facility Living arrangements for the past 2 months: Single Family Home                                       Social Drivers of Health (SDOH) Interventions SDOH Screenings   Food Insecurity: No Food Insecurity (03/05/2024)  Housing: Unknown (03/05/2024)  Transportation Needs: Patient Declined (03/05/2024)  Utilities: Patient Declined (03/05/2024)  Social Connections: Patient Declined (03/05/2024)  Tobacco Use: Low Risk  (03/06/2024)    Readmission Risk Interventions     No data to display

## 2024-03-13 LAB — BPAM RBC
Blood Product Expiration Date: 202511282359
ISSUE DATE / TIME: 202511101449
Unit Type and Rh: 6200

## 2024-03-13 LAB — TYPE AND SCREEN
ABO/RH(D): A POS
Antibody Screen: NEGATIVE
Unit division: 0

## 2024-03-13 LAB — BASIC METABOLIC PANEL WITH GFR
Anion gap: 8 (ref 5–15)
BUN: 30 mg/dL — ABNORMAL HIGH (ref 8–23)
CO2: 21 mmol/L — ABNORMAL LOW (ref 22–32)
Calcium: 7.8 mg/dL — ABNORMAL LOW (ref 8.9–10.3)
Chloride: 106 mmol/L (ref 98–111)
Creatinine, Ser: 1.37 mg/dL — ABNORMAL HIGH (ref 0.61–1.24)
GFR, Estimated: 54 mL/min — ABNORMAL LOW (ref >60)
Glucose, Bld: 107 mg/dL — ABNORMAL HIGH (ref 70–99)
Potassium: 5 mmol/L (ref 3.5–5.1)
Sodium: 135 mmol/L (ref 135–145)

## 2024-03-13 LAB — CBC
HCT: 24.4 % — ABNORMAL LOW (ref 39.0–52.0)
Hemoglobin: 8 g/dL — ABNORMAL LOW (ref 13.0–17.0)
MCH: 31.9 pg (ref 26.0–34.0)
MCHC: 32.8 g/dL (ref 30.0–36.0)
MCV: 97.2 fL (ref 80.0–100.0)
Platelets: 309 K/uL (ref 150–400)
RBC: 2.51 MIL/uL — ABNORMAL LOW (ref 4.22–5.81)
RDW: 14.5 % (ref 11.5–15.5)
WBC: 12.5 K/uL — ABNORMAL HIGH (ref 4.0–10.5)
nRBC: 0 % (ref 0.0–0.2)

## 2024-03-13 MED ORDER — HYDRALAZINE HCL 20 MG/ML IJ SOLN
10.0000 mg | Freq: Four times a day (QID) | INTRAMUSCULAR | Status: DC | PRN
  Administered 2024-03-13: 10 mg via INTRAVENOUS
  Filled 2024-03-13: qty 1

## 2024-03-13 NOTE — Plan of Care (Signed)

## 2024-03-13 NOTE — TOC Progression Note (Signed)
 Transition of Care [facility]) - Progression Note    Patient Details  Name: [name] [name] MRN: [id number] Date of Birth: [birth year]  Transition of Care [facility]) CM/SW Contact  Almarie CHRISTELLA Goodie, [place] Phone Number: 03/13/2024, 1:43 PM  Clinical Narrative:   CSW met with patient and brother at bedside to discuss SNF offers. Brother had not been made aware that [facility] had declined, said he would need to discuss with his wife and get back to CSW.  CSW met with patient and brother again per request, brother said he called and spoke with [facility], and that CSW needed to send the referral again. CSW sent referral again, spoke with Admissions again. Admissions reviewed referral again, but they still cannot offer the patient a bed. CSW met with patient and brother to update, they would like to choose [facility]. CSW confirmed bed availability with [facility], and contacted CMA to request insurance authorization. CSW to follow.    Expected Discharge Plan: Skilled Nursing Facility Barriers to Discharge: Continued Medical Work up, English As A Second Language Teacher               Expected Discharge Plan and Services     Post Acute Care Choice: Skilled Nursing Facility Living arrangements for the past 2 months: Single Family Home                                       Social Drivers of Health (SDOH) Interventions SDOH Screenings   Food Insecurity: No Food Insecurity (03/05/2024)  Housing: Unknown (03/05/2024)  Transportation Needs: Patient Declined (03/05/2024)  Utilities: Patient Declined (03/05/2024)  Social Connections: Patient Declined (03/05/2024)  Tobacco Use: Low Risk  (03/06/2024)    Readmission Risk Interventions     No data to display

## 2024-03-13 NOTE — Progress Notes (Addendum)
 STROKE TEAM PROGRESS NOTE   INTERIM HISTORY/SUBJECTIVE Brother is at the bedside Hgb 8.0 after 1uPRBC Resume xarelto 10mg  for total 30 days at discharge for DVT ppx  Still reporting stiffness and some pain. Requiring PRNs, hopefully able to work more with PT/OT  CBC    Component Value Date/Time   WBC 12.5 (H) 03/13/2024 0110   RBC 2.51 (L) 03/13/2024 0110   HGB 8.0 (L) 03/13/2024 0110   HCT 24.4 (L) 03/13/2024 0110   PLT 309 03/13/2024 0110   MCV 97.2 03/13/2024 0110   MCH 31.9 03/13/2024 0110   MCHC 32.8 03/13/2024 0110   RDW 14.5 03/13/2024 0110   LYMPHSABS 1.0 03/03/2024 1546   MONOABS 1.1 (H) 03/03/2024 1546   EOSABS 0.0 03/03/2024 1546   BASOSABS 0.1 03/03/2024 1546    BMET    Component Value Date/Time   NA 135 03/13/2024 0110   NA 137 10/07/2023 1311   K 5.0 03/13/2024 0110   CL 106 03/13/2024 0110   CO2 21 (L) 03/13/2024 0110   GLUCOSE 107 (H) 03/13/2024 0110   BUN 30 (H) 03/13/2024 0110   BUN 22 10/07/2023 1311   CREATININE 1.37 (H) 03/13/2024 0110   CALCIUM 7.8 (L) 03/13/2024 0110   EGFR 30.0 01/20/2024 0936   EGFR 69 10/07/2023 1311   GFRNONAA 54 (L) 03/13/2024 0110    IMAGING past 24 hours No results found.    Vitals:   03/13/24 0023 03/13/24 0409 03/13/24 0442 03/13/24 0750  BP: (!) 132/51 (!) 132/51 (!) 167/55 (!) 165/57  Pulse:   (!) 48 (!) 50  Resp:   18 18  Temp:   98.3 F (36.8 C) 98.7 F (37.1 C)  TempSrc:    Oral  SpO2:   98% 96%  Weight:      Height:        PHYSICAL EXAM General:  Alert, well-nourished, well-developed patient in no acute distress CV: Regular rate and rhythm on monitor Respiratory:  Regular, unlabored respirations on room air GI: Abdomen soft and nontender   NEURO: awake, alert, eyes open, orientated to place, time, but told me age 78 instead of 65, not quit orientated to situation. No aphasia, paucity of speech but answering questions appropriately, following all simple commands. Able to name 3/4 and repeat  simple sentence with some difficulty with complex sentences. No gaze palsy, tracking bilaterally, visual field full, PERRL. No facial droop. Tongue midline. LUE 5/5, LUE proximal 4/5 due to rotate cuff injury, LLE proximal 3/5 and distally 5/5. RLE proximal 2-/5 due to pain at hip, distally 5/5. Sensation symmetrical bilaterally, b/l FTN intact but slow R>L, gait not tested.     ASSESSMENT/PLAN   [name] is a 73 y.o. 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  Per EMS will check was called and EMS found him on the floor with a laceration on his head, noted to be confused agitated, with left gaze preference and hypertensive at 240/110. NIH on Admission 18   Stroke s/p TNK - punctate left occipital cortex infarct, etiology: Likely new diagnosed PAF/Aflutter Code Stroke  CT head No acute abnormality. ASPECTS 10.  Old left frontal infarct and chronic ischemic white matter changes  CTA head & neck No LVO Atherosclerotic disease at both carotid bifurcations without hemodynamically significant stenosis  MRI small left superior occipital cortical infarct 2D Echo EF > 75%.  Mild LVH with grade 1 diastolic dysfunction BLE DVT US : Negative for DVT LDL 127 HgbA1c 5.3 VTE prophylaxis - change Xarelto 10mg  daily to Heparin  SQ Q8h No antithrombotic prior to admission, no antithrombotics for now given severe anemia. Will consider Xarelto 10mg  daily total 30 day course for DVT prophylaxis and then follow up with cardiology as outpt for antithrombotic regimen decisions Therapy recommendations:  SNF Disposition: Pending   Atrial tachycardia / aflutter with RVR EKG showed afib with RVR Cardiology on board, concerning for aflutter New diagnosis Telemetry On p.o. amiodarone no antithrombotics for now given  severe anemia.  Will follow-up as outpatient with cardiology for antithrombotic regimen decisions  S/p unwitnessed fall Acute right femur fracture Pelvis x-ray Right femoral intertrochanteric fracture with displacement and varus angulation. CT chest abdomen pelvis:  Moderately displaced and comminuted intertrochanteric fracture of the proximal right femur, acute. Old bilateral rib fractures, chronic. Chronic L1 vertebral body compression fracture, chronic. Status post appendectomy. Aortic atherosclerosis. Coronary artery calcifications. Diverticulosis of the descending and sigmoid colon without evidence of inflammation. Possible bilateral nephrolithiasis. Ortho consulted S/p Right hip fracture repair on 11//2025 Reporting pain and stiffness - hydrocodone , oxycodone ,, and fentanyl  PRN (not using fentanyl )    Severe anemia Hb 15.0--11.0--7.3--7.7 -7.2 -7.1-8.0 No overt bleeding CBC monitoring Blood transfusion 11/10 x 1  Hypertension Home meds: Amlodipine  5 mg, propranolol  20 mg Stable On bisoprolol  Long-term BP goal normotensive   Hyperlipidemia Home meds: Zetia  10 mg,  resumed in hospital LDL 127, goal < 70 Atorvastatin 40 mg added  Continue both meds at discharge   Substance Abuse History of EtOH abuse History of liver cirrhosis, esophageal varices UDS positive for THC TOC consult for cessation placed   AKI, improving Cr 1.36 - 1.29-1.32-1.37 Encourage p.o. intake BMP monitoring  Other Stroke Risk Factors Obesity, Body mass index is 31.95 kg/m., BMI >/= 30 associated with increased stroke risk, recommend weight loss, diet and exercise as appropriate  Silent stroke, Old left frontal infarct on brain imaging CHF Obstructive sleep apnea  Other active problems History of left vocal cord paralyzed this   Patient seen and examined by NP/APP with MD. MD to update note as needed.   [name] Last, DNP, FNP-BC [facility] Pager: [phone]  ATTENDING NOTE: I reviewed above note and agree with the assessment and plan. Pt was seen and examined.   No acute event overnight.  Patient hemoglobin improved after yesterday blood transfusion.  Still on the low end, no antithrombotic at this time.  However likely to resume Xarelto 10 mg daily for DVT prophylaxis for total of 30-day course after hip surgery.  After that, patient will follow-up with cardiology as outpatient for further antithrombotic regimen decision.  For detailed assessment and plan, please refer to above as I have made changes wherever appropriate.   [name] Cummins, MD PhD Stroke Neurology 03/13/2024 6:19 PM     To contact Stroke Continuity provider, please refer to [email or web address]. After hours, contact General Neurology

## 2024-03-13 NOTE — Plan of Care (Signed)
  Problem: Ischemic Stroke/TIA Tissue Perfusion: Goal: Complications of ischemic stroke/TIA will be minimized Outcome: Progressing   Problem: Coping: Goal: Will verbalize positive feelings about self Outcome: Progressing Goal: Will identify appropriate support needs Outcome: Progressing   Problem: Self-Care: Goal: Ability to communicate needs accurately will improve Outcome: Progressing   Problem: Nutrition: Goal: Risk of aspiration will decrease Outcome: Progressing Goal: Dietary intake will improve Outcome: Progressing   Problem: Clinical Measurements: Goal: Respiratory complications will improve Outcome: Progressing Goal: Cardiovascular complication will be avoided Outcome: Progressing

## 2024-03-14 ENCOUNTER — Other Ambulatory Visit (HOSPITAL_COMMUNITY): Payer: Self-pay

## 2024-03-14 LAB — BASIC METABOLIC PANEL WITH GFR
Anion gap: 9 (ref 5–15)
BUN: 26 mg/dL — ABNORMAL HIGH (ref 8–23)
CO2: 21 mmol/L — ABNORMAL LOW (ref 22–32)
Calcium: 8.1 mg/dL — ABNORMAL LOW (ref 8.9–10.3)
Chloride: 104 mmol/L (ref 98–111)
Creatinine, Ser: 1.19 mg/dL (ref 0.61–1.24)
GFR, Estimated: >60 mL/min (ref >60)
Glucose, Bld: 120 mg/dL — ABNORMAL HIGH (ref 70–99)
Potassium: 4.5 mmol/L (ref 3.5–5.1)
Sodium: 134 mmol/L — ABNORMAL LOW (ref 135–145)

## 2024-03-14 LAB — CBC
HCT: 25.7 % — ABNORMAL LOW (ref 39.0–52.0)
Hemoglobin: 8.2 g/dL — ABNORMAL LOW (ref 13.0–17.0)
MCH: 31.2 pg (ref 26.0–34.0)
MCHC: 31.9 g/dL (ref 30.0–36.0)
MCV: 97.7 fL (ref 80.0–100.0)
Platelets: 338 K/uL (ref 150–400)
RBC: 2.63 MIL/uL — ABNORMAL LOW (ref 4.22–5.81)
RDW: 14.2 % (ref 11.5–15.5)
WBC: 13 K/uL — ABNORMAL HIGH (ref 4.0–10.5)
nRBC: 0 % (ref 0.0–0.2)

## 2024-03-14 MED ORDER — RIVAROXABAN 10 MG PO TABS: 10.0000 mg | ORAL_TABLET | Freq: Every day | ORAL | Status: DC

## 2024-03-14 MED ORDER — RIVAROXABAN 10 MG PO TABS: 10.0000 mg | ORAL_TABLET | Freq: Every day | ORAL | 0 refills | Status: DC

## 2024-03-14 MED ORDER — POLYETHYLENE GLYCOL 3350 17 G PO PACK: 17.0000 g | PACK | Freq: Every day | ORAL | 0 refills | Status: AC

## 2024-03-14 MED ORDER — OXYCODONE HCL 10 MG PO TABS: 10.0000 mg | ORAL_TABLET | ORAL | 0 refills | Status: DC | PRN

## 2024-03-14 MED ORDER — CYCLOBENZAPRINE HCL 5 MG PO TABS: 5.0000 mg | ORAL_TABLET | Freq: Three times a day (TID) | ORAL | 0 refills | Status: DC | PRN

## 2024-03-14 MED ORDER — AMIODARONE HCL 200 MG PO TABS: 200.0000 mg | ORAL_TABLET | Freq: Two times a day (BID) | ORAL | 0 refills | Status: DC

## 2024-03-14 MED ORDER — RIVAROXABAN 10 MG PO TABS
10.0000 mg | ORAL_TABLET | Freq: Every day | ORAL | 0 refills | Status: DC
  Filled 2024-03-14: qty 21, 21d supply, fill #0

## 2024-03-14 MED ORDER — SENNOSIDES-DOCUSATE SODIUM 8.6-50 MG PO TABS: 1.0000 | ORAL_TABLET | Freq: Every evening | ORAL | 0 refills | Status: DC | PRN

## 2024-03-14 MED ORDER — ATORVASTATIN CALCIUM 40 MG PO TABS: 40.0000 mg | ORAL_TABLET | Freq: Every day | ORAL | 0 refills | Status: DC

## 2024-03-14 MED ORDER — HYDROCODONE-ACETAMINOPHEN 7.5-325 MG PO TABS: 1.0000 | ORAL_TABLET | ORAL | 0 refills | Status: DC | PRN

## 2024-03-14 MED ORDER — BISOPROLOL FUMARATE 10 MG PO TABS: 10.0000 mg | ORAL_TABLET | Freq: Every day | ORAL | 0 refills | Status: DC

## 2024-03-14 NOTE — Progress Notes (Signed)
 Orthopedic Surgery Progress Note   Assessment: Patient is a 73 y.o. male with right intertrochanteric femur fracture status post CMN   Plan: -Operative plans: complete -Can leave incisions open to air -Okay to let soap/water run over incisions but do not submerge -Diet: regular -DVT ppx: Xarelto -Weight bearing status: as tolerated -PT evaluate and treat -Pain control -Dispo: per primary  ___________________________________________________________________________  Subjective: No acute events overnight. Pain well controlled. No complaints this morning.    Physical Exam:  General: no acute distress, appears stated age Neurologic: sleeping but awakes to voice, answering questions appropriately, following commands Respiratory: unlabored breathing on room air, symmetric chest rise Psychiatric: appropriate affect, normal cadence to speech  MSK:   -Right lower extremity  Dressings over hip c/d/i EHL/TA/GSC intact Plantarflexes and dorsiflexes toes Sensation intact to light touch in sural, saphenous, tibial, deep peroneal, and superficial peroneal nerve distributions Foot warm and well perfused   Yesterday's total administered Morphine  Milligram Equivalents: 67.5   Patient name: [name] [name] Patient MRN: [id number] Date: 03/14/24

## 2024-03-14 NOTE — TOC Transition Note (Signed)
 Transition of Care [facility]) - Discharge Note   Patient Details  Name: [name] [name] MRN: [id number] Date of Birth: 08-Oct-1950  [facility] Care [facility]) CM/SW Contact:  Almarie CHRISTELLA Goodie, LCSW Phone Number: 03/14/2024, 1:26 PM   Clinical Narrative:   Patient received insurance approval, stable to discharge to SNF today. CSW confirmed with [facility] that they can admit today. Brother, Merilee, updated and in agreement. Transport arranged with [facility] for next available.  Nurse to call report to [phone], Room 114.    Final next level of care: Skilled Nursing Facility Barriers to Discharge: Barriers Resolved   Patient Goals and CMS Choice Patient states their goals for this hospitalization and ongoing recovery are:: to get rehab CMS Medicare.gov Compare Post Acute Care list provided to:: Patient Choice offered to / list presented to : Patient [place] ownership interest in [facility].provided to:: Patient    Discharge Placement              Patient chooses bed at: [facility] Patient to be transferred to facility by: [facility] Name of family member notified: Merilee Patient and family notified of of transfer: 03/14/24  Discharge Plan and Services Additional resources added to the After Visit Summary for       Post Acute Care Choice: Skilled Nursing Facility                               Social Drivers of Health (SDOH) Interventions SDOH Screenings   Food Insecurity: No Food Insecurity (03/05/2024)  Housing: Unknown (03/05/2024)  Transportation Needs: Patient Declined (03/05/2024)  Utilities: Patient Declined (03/05/2024)  Social Connections: Patient Declined (03/05/2024)  Tobacco Use: Low Risk  (03/06/2024)     Readmission Risk Interventions     No data to display

## 2024-03-14 NOTE — Progress Notes (Signed)
 PT Dcing with [facility]. VSS. All lines removed.

## 2024-03-14 NOTE — Progress Notes (Signed)
 Speech Language Pathology Treatment: Cognitive-Linguistic  Patient Details Name: [name] [name] MRN: [id number] DOB: 06-10-1950 Today's Date: 03/14/2024 Time: 8856-8799 SLP Time Calculation (min) (ACUTE ONLY): 17 min  Assessment / Plan / Recommendation Clinical Impression  Patient was alert and awake for today's session. SLP from last session on 11/6, recommended a cognitive assessment for patient. Today's session included The Neurobehavioral Cognitive Status Examination (COGNISTAT). SLP administered assessment and patient was engaged and participatory throughout the session. Patient scored within the average range for orientation, attention, repetiton, reasoning/similarities, and judgement. Patient scored within the mild impairment range for memory registration and calcluations. Patient scored in the severe impairment range for memory. At the end of the session, patient shared with SLP that his brain feels foggy sometimes and that it, comes and goes. Patient shared that he has noticed improvement in his memory and cognition the further he recovers from his stroke. Patient asked if there were things SLP could do to help him with this. SLP explained how therapy can focus on improving his memory through different activities. Patient was encouraged and agreed for SLP to continue to continue to follow for cognitive treatment, focusing on patient's memory.    HPI HPI: Patient is a 73 y.o. 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 Per EMS will check was called and EMS found him on the floor with a laceration on his head, noted to be confused agitated, with left gaze preference and hypertensive. S/p TNK. MRI shows small left occipital infarct. Also found to have R hip fx, s/p ORIF 11/4. Patient with history  of silent aspiration in 2015 with recommendations for Dys 3 solids and nectar-thick liquids with free water protocol. Further cognitive assessment requested.      SLP Plan  Continue with current plan of care          Recommendations                     Oral care BID   Intermittent Supervision/Assistance Cognitive communication deficit (R41.841);Dysphagia, unspecified (R13.10)     Continue with current plan of care    Damien Hy  Graduate SLP Clinican

## 2024-03-14 NOTE — Progress Notes (Signed)
 Patient going to [facility], rm 114. Report given to Jamie, CHAR[facility] FUNDRAISER.

## 2024-03-15 ENCOUNTER — Other Ambulatory Visit (HOSPITAL_COMMUNITY): Payer: Self-pay

## 2024-03-15 ENCOUNTER — Telehealth: Payer: Self-pay

## 2024-03-15 NOTE — Telephone Encounter (Signed)
 Kacie, WC nurse with [facility] would like clarification on wound care instructions.  Stated that patient came in with incision open to air, incision looked good.  CB# [phone] or [phone].  Please advise.  Thank you

## 2024-03-15 NOTE — Telephone Encounter (Signed)
 Called and lmom for Kacie advised of Dr. Carliss message

## 2024-03-19 ENCOUNTER — Ambulatory Visit: Attending: Internal Medicine | Admitting: Internal Medicine

## 2024-03-19 VITALS — BP 130/80 | HR 41 | Ht 67.0 in | Wt 200.0 lb

## 2024-03-19 DIAGNOSIS — E785 Hyperlipidemia, unspecified: Secondary | ICD-10-CM

## 2024-03-19 DIAGNOSIS — I350 Nonrheumatic aortic (valve) stenosis: Secondary | ICD-10-CM | POA: Diagnosis not present

## 2024-03-19 DIAGNOSIS — I119 Hypertensive heart disease without heart failure: Secondary | ICD-10-CM

## 2024-03-19 DIAGNOSIS — D649 Anemia, unspecified: Secondary | ICD-10-CM

## 2024-03-19 DIAGNOSIS — I498 Other specified cardiac arrhythmias: Secondary | ICD-10-CM

## 2024-03-19 LAB — LDL CHOLESTEROL, DIRECT

## 2024-03-19 MED ORDER — AMIODARONE HCL 200 MG PO TABS: 200.0000 mg | ORAL_TABLET | Freq: Every day | ORAL | 3 refills | Status: DC

## 2024-03-19 MED ORDER — BISOPROLOL FUMARATE 5 MG PO TABS: 5.0000 mg | ORAL_TABLET | Freq: Every day | ORAL | 3 refills | Status: DC

## 2024-03-19 NOTE — Patient Instructions (Signed)
 Medication Instructions:  Your physician has recommended you make the following change in your medication:  DECREASE: Bisoprolol 5 mg once daily DECREASE: Amiodarone 200 mg once daily  *If you need a refill on your cardiac medications before your next appointment, please call your pharmacy*  Lab Work: CBC, Direct LDL If you have labs (blood work) drawn today and your tests are completely normal, you will receive your results only by: MyChart Message (if you have MyChart) OR A paper copy in the mail If you have any lab test that is abnormal or we need to change your treatment, we will call you to review the results.  Follow-Up: At [facility], you and your health needs are our priority.  As part of our continuing mission to provide you with exceptional heart care, our providers are all part of one team.  This team includes your primary Cardiologist (physician) and Advanced Practice Providers or APPs (Physician Assistants and Nurse Practitioners) who all work together to provide you with the care you need, when you need it.  Your next appointment:    Feb 2026  Provider:   Glendia Ferrier, PA-C

## 2024-03-19 NOTE — Progress Notes (Signed)
 Cardiology Office Note:  .    Date:  03/19/2024  ID:  [name] [name], DOB 1951-04-28, MRN [id number] PCP: Loring Tanda Mae, MD  [facility] Providers Cardiologist:  Stanly DELENA Leavens, MD     CC: Secondary prevention  History of Present Illness: .    [name] [name] is a patient with history of HTN and AS seen to establish care in 2024.  Saw SW and I to support his care.  Mr.  [name] is a 73 year old male with hypertension, aortic stenosis, and heart failure who presents for preoperative evaluation and follow-up after recent hospitalization for a stroke; AFL mediated.  He was hospitalized on March 04, 2024, for a stroke and received TNK treatment. During his hospital stay, atrial tachycardia was noted, and he was started on bisoprolol. No current symptoms of chest pain, chest pressure, or tightness are reported. A stress test showed elevated blood pressure.  He has moderate aortic stenosis, which has been stable. No significant shortness of breath or syncope is reported.  He is currently on several medications including bisoprolol, amiodarone, and rivaroxaban. He also has a history of anemia with a hemoglobin level of 8.2 at discharge, and a repeat CBC is planned.  He reports feeling generally well except for leg pain following recent surgery where a pin was placed in his leg. He has follow-up appointments scheduled with his orthopedic and vascular surgeons.        Relevant histories: .  Social  2015: Found to have AS 2016: Lost his wife 2024: Returned to cardiology, HTN urgency 2025: Elevated BP at vascular evaluation [facility] Care: Me and Scott Weaver ROS: As per HPI.   Studies Reviewed: .   Cardiac Studies & Procedures   ______________________________________________________________________________________________   STRESS TESTS  MYOCARDIAL PERFUSION IMAGING 08/10/2022  Interpretation Summary   Markedly hypertensive, resting BP 215/86, increased to 264/128  following stress   The study is normal. The study is low risk.   No ST deviation was noted.   LV perfusion is abnormal. There is no evidence of ischemia. There is no evidence of infarction. Defect 1: There is a medium defect with mild reduction in uptake present in the apical to basal inferior location(s) that is fixed. There is normal wall motion in the defect area. Consistent with artifact caused by diaphragmatic attenuation.   Left ventricular function is abnormal. Global function is mildly reduced. Nuclear stress EF: 52 %. The left ventricular ejection fraction is mildly decreased (45-54%). End diastolic cavity size is severely enlarged. End systolic cavity size is severely enlarged.   Prior study not available for comparison.  Markedly hypertensive, resting BP 215/86, increased to 264/128 following stress Fixed inferior perfusion defect with normal wall motion, consistent with artifact LV is dilated, EDV .  Low normal EF (52%) Low risk study   ECHOCARDIOGRAM  ECHOCARDIOGRAM COMPLETE 03/04/2024  Narrative ECHOCARDIOGRAM REPORT    Patient Name:   [name] [name] Date of Exam: 03/04/2024 Medical Rec #:  [id number]     Height:       66.0 in Accession #:    [id number]    Weight:       198.0 lb Date of Birth:  09-Dec-1950     BSA:          1.991 m Patient Age:    73 years      BP:           163/99 mmHg Patient Gender: M  HR:           103 bpm. Exam Location:  Inpatient  Procedure: 2D Echo (Both Spectral and Color Flow Doppler were utilized during procedure).  Indications:    stroke  History:        Patient has prior history of Echocardiogram examinations, most recent 08/09/2023. Cirrhosis; Risk Factors:Hypertension, Dyslipidemia and Sleep Apnea.  Sonographer:    Tinnie Barefoot RDCS Referring Phys: [id number] ASHISH ARORA   Sonographer Comments: Technically difficult study due to poor echo windows and suboptimal subcostal window. Image acquisition challenging due to  respiratory motion. IMPRESSIONS   1. Left ventricular ejection fraction, by estimation, is >75%. The left ventricle has hyperdynamic function. The left ventricle has no regional wall motion abnormalities. There is mild left ventricular hypertrophy. Left ventricular diastolic parameters are consistent with Grade I diastolic dysfunction (impaired relaxation). 2. Right ventricular systolic function is normal. The right ventricular size is normal. 3. The mitral valve is normal in structure. No evidence of mitral valve regurgitation. No evidence of mitral stenosis. The mean mitral valve gradient is 3.7 mmHg. Moderate mitral annular calcification. 4. The aortic valve is normal in structure. Aortic valve regurgitation is not visualized. No aortic stenosis is present. 5. The inferior vena cava is normal in size with greater than 50% respiratory variability, suggesting right atrial pressure of 3 mmHg.  Conclusion(s)/Recommendation(s): No intracardiac source of embolism detected on this transthoracic study. Consider a transesophageal echocardiogram to exclude cardiac source of embolism if clinically indicated.  FINDINGS Left Ventricle: Left ventricular ejection fraction, by estimation, is >75%. The left ventricle has hyperdynamic function. The left ventricle has no regional wall motion abnormalities. Definity contrast agent was given IV to delineate the left ventricular endocardial borders. The left ventricular internal cavity size was normal in size. There is mild left ventricular hypertrophy. Left ventricular diastolic parameters are consistent with Grade I diastolic dysfunction (impaired relaxation).  Right Ventricle: The right ventricular size is normal. No increase in right ventricular wall thickness. Right ventricular systolic function is normal.  Left Atrium: Left atrial size was normal in size.  Right Atrium: Right atrial size was normal in size.  Pericardium: There is no evidence of pericardial  effusion.  Mitral Valve: The mitral valve is normal in structure. Moderate mitral annular calcification. No evidence of mitral valve regurgitation. No evidence of mitral valve stenosis. MV peak gradient, 11.5 mmHg. The mean mitral valve gradient is 3.7 mmHg.  Tricuspid Valve: The tricuspid valve is normal in structure. Tricuspid valve regurgitation is not demonstrated. No evidence of tricuspid stenosis.  Aortic Valve: The aortic valve is normal in structure. Aortic valve regurgitation is not visualized. No aortic stenosis is present. Aortic valve mean gradient measures 13.7 mmHg. Aortic valve peak gradient measures 26.6 mmHg. Aortic valve area, by VTI measures 1.27 cm.  Pulmonic Valve: The pulmonic valve was normal in structure. Pulmonic valve regurgitation is not visualized. No evidence of pulmonic stenosis.  Aorta: The aortic root is normal in size and structure.  Venous: The inferior vena cava is normal in size with greater than 50% respiratory variability, suggesting right atrial pressure of 3 mmHg.  IAS/Shunts: No atrial level shunt detected by color flow Doppler.   LEFT VENTRICLE PLAX 2D LVIDd:         3.80 cm   Diastology LVIDs:         2.10 cm   LV e' medial:    6.20 cm/s LV PW:         1.30 cm  LV E/e' medial:  8.3 LV IVS:        1.30 cm   LV e' lateral:   8.38 cm/s LVOT diam:     1.80 cm   LV E/e' lateral: 6.1 LV SV:         44 LV SV Index:   22 LVOT Area:     2.54 cm   RIGHT VENTRICLE RV Basal diam:  2.70 cm RV S prime:     23.00 cm/s  LEFT ATRIUM           Index        RIGHT ATRIUM           Index LA diam:      3.60 cm 1.81 cm/m   RA Area:     13.60 cm LA Vol (A4C): 43.5 ml 21.85 ml/m  RA Volume:   30.00 ml  15.07 ml/m AORTIC VALVE AV Area (Vmax):    1.21 cm AV Area (Vmean):   1.23 cm AV Area (VTI):     1.27 cm AV Vmax:           257.67 cm/s AV Vmean:          164.333 cm/s AV VTI:            0.342 m AV Peak Grad:      26.6 mmHg AV Mean Grad:       13.7 mmHg LVOT Vmax:         123.00 cm/s LVOT Vmean:        79.550 cm/s LVOT VTI:          0.171 m LVOT/AV VTI ratio: 0.50  AORTA Ao Root diam: 3.50 cm  MITRAL VALVE MV Area VTI:  1.26 cm      SHUNTS MV Peak grad: 11.5 mmHg     Systemic VTI:  0.17 m MV Mean grad: 3.7 mmHg      Systemic Diam: 1.80 cm MV Vmax:      1.69 m/s MV Vmean:     87.2 cm/s MV E velocity: 51.40 cm/s MV A velocity: 146.00 cm/s MV E/A ratio:  0.35  Oneil Parchment MD Electronically signed by Oneil Parchment MD Signature Date/Time: 03/04/2024/12:54:41 PM    Final          ______________________________________________________________________________________________      Physical Exam:    VS:  BP 130/80 (Patient Position: Sitting)   Pulse (!) 41   Ht 5' 7 (1.702 m)   Wt 200 lb (90.7 kg)   SpO2 99%   BMI 31.32 kg/m    Wt Readings from Last 3 Encounters:  03/19/24 200 lb (90.7 kg)  03/05/24 197 lb 15.6 oz (89.8 kg)  09/21/23 208 lb 6.4 oz (94.5 kg)    Gen: No Distress Neck: No JVD, bilateral Frank's Sign Cardiac: No Rubs or Gallops, systolic murmur with diminished A2 but distant heart sounds, regularly bradycardia, +2 radial pulses Respiratory: Clear to auscultation bilaterally, normal effort, normal  respiratory rate GI: Soft, nontender, non-distended  MS: Trace R leg edema;  moves all extremities Integument: Skin feels warm Neuro:  At time of evaluation, alert and oriented to person/place/time/situation  Psych: Normal affect, patient feels ok, save for leg pain   ASSESSMENT AND PLAN: .    Atrial fibrillation and atrial tachycardia Bradycardia (asymptomatic) Persistent atrial fibrillation and atrial tachycardia with heart rate in the 40s s/p AAD therapy. Currently on amiodarone and rivaroxaban. No symptoms reported. Long-term management includes anticoagulation and rate control. - Decreased  amiodarone to 200 mg daily - Decreased bisoprolol to 5 mg daily - Continue rivaroxaban  Moderate  aortic stenosis Calcified and thickened aortic valve. No symptoms of shortness of breath or exertional intolerance. Heart function is normal. No immediate intervention required. Would need TAVR intervention - Will repeat echocardiogram in one year unless symptoms develop  Hypertension Previously uncontrolled with blood pressure up to 190/82. Currently well-controlled at 130/80. Bisoprolol dose adjustment due to low heart rate. - Decreased bisoprolol to 5 mg daily  Anemia Hemoglobin of 8.2. Monitoring required to ensure stability. - Ordered repeat CBC  Hyperlipidemia Managed with medication. Stroke history necessitates low cholesterol levels. - Ordered LDL direct test  Longitudinal care: The evaluation and management services provided today reflect the complexity inherent in caring for this patient, including the ongoing longitudinal relationship and management of multiple chronic conditions and/or the need for care coordination. The visit required a comprehensive assessment and management plan tailored to the patient's unique needs Time was spent addressing not only the acute concerns but also the broader context of the patient's health, including preventive care, chronic disease management, and care coordination as appropriate.  Complex longitudinal is necessary for conditions including: AS care in complex patient with variable medication adherence.  His family his here today and we discussed his care at length and his SIL took notes.  February with Glendia Ferrier, then me or SW as appropriate   Stanly Leavens, MD [facility] Cardiologist [facility]  [address], [address] [place], [place] [address] [phone]  2:07 PM

## 2024-03-20 ENCOUNTER — Telehealth: Payer: Self-pay

## 2024-03-20 ENCOUNTER — Ambulatory Visit: Payer: Self-pay | Admitting: Internal Medicine

## 2024-03-20 LAB — CBC
Hematocrit: 29.3 % — ABNORMAL LOW (ref 37.5–51.0)
Hemoglobin: 9.1 g/dL — ABNORMAL LOW (ref 13.0–17.7)
MCH: 30.3 pg (ref 26.6–33.0)
MCHC: 31.1 g/dL — ABNORMAL LOW (ref 31.5–35.7)
MCV: 98 fL — ABNORMAL HIGH (ref 79–97)
Platelets: 487 x10E3/uL — ABNORMAL HIGH (ref 150–450)
RBC: 3 x10E6/uL — ABNORMAL LOW (ref 4.14–5.80)
RDW: 13.2 % (ref 11.6–15.4)
WBC: 14.3 x10E3/uL — ABNORMAL HIGH (ref 3.4–10.8)

## 2024-03-20 LAB — LDL CHOLESTEROL, DIRECT: LDL Direct: 58 mg/dL (ref 0–99)

## 2024-03-20 NOTE — Telephone Encounter (Signed)
 Kacie, wound care nurse with [facility] wanted to let Dr. Georgina know that patients right hip is bright red, warm, and has a lot of edema in the surgical area.  Stated that patient said that staples are to tight.  CB# [phone].  Please advise.  Thank you.

## 2024-03-20 NOTE — Telephone Encounter (Signed)
 I called and advised [name] [name] of Dr. Jeraline message.

## 2024-03-22 ENCOUNTER — Other Ambulatory Visit (INDEPENDENT_AMBULATORY_CARE_PROVIDER_SITE_OTHER): Payer: Self-pay

## 2024-03-22 ENCOUNTER — Ambulatory Visit (INDEPENDENT_AMBULATORY_CARE_PROVIDER_SITE_OTHER): Admitting: Orthopedic Surgery

## 2024-03-22 DIAGNOSIS — S72141A Displaced intertrochanteric fracture of right femur, initial encounter for closed fracture: Secondary | ICD-10-CM

## 2024-03-22 NOTE — Progress Notes (Signed)
 Orthopedic Surgery Post-operative Office Visit  Procedure: right intertrochanteric femur fracture CMR Date of Surgery: 03/06/2024 (~2 weeks post-op)  Assessment: Patient is a 73 y.o. who is still experiencing significant pain in his hip since surgery   Plan: -Operative plans complete -Weight bearing as tolerated right lower extremity -Staples removed today in office -Okay to let soap/water run over incision but do not submerge -Pain management: weaning oxycodone  -Return to office in 4 weeks, x-rays needed at next visit: AP/lateral right femur  ___________________________________________________________________________   Subjective: Patient has been in a skilled nursing facility since discharge from the hospital. His pain remains pretty significant in the hip. He is working with PT at the facility but has not been mobilizing much. He has not noticed any redness or drainage around his incisions.   Objective:  General: no acute distress, appropriate affect Neurologic: alert, answering questions appropriately, following commands Respiratory: unlabored breathing on room air Skin: incisions are well approximated with no erythema, induration, active/expressible drainage  MSK (RLE): EHL/TA/GSC intact, SILT in s/s/dp/sp/t nerve distributions, foot warm and well perfused  Imaging: XRs of the right femur from 03/22/2024 were independently reviewed and interpreted, showing a minimally displaced intertrochanteric femur fracture.  Fracture alignment appears unchanged from prior films on 11//2025.  No lucency seen around the lag or distal interlocking screws.  No new fracture seen.   Patient name: [name] [name] Patient MRN: [id number] Date of visit: 03/22/24

## 2024-03-27 ENCOUNTER — Telehealth: Payer: Self-pay | Admitting: Adult Health

## 2024-03-27 NOTE — Telephone Encounter (Signed)
 [facility]) called to verify patient's appointment and office address.

## 2024-03-28 NOTE — Telephone Encounter (Signed)
 Letter of results sent to pt

## 2024-04-04 ENCOUNTER — Encounter: Payer: Self-pay | Admitting: Vascular Surgery

## 2024-04-04 ENCOUNTER — Ambulatory Visit (HOSPITAL_COMMUNITY)
Admission: RE | Admit: 2024-04-04 | Discharge: 2024-04-04 | Disposition: A | Source: Ambulatory Visit | Attending: Vascular Surgery

## 2024-04-04 ENCOUNTER — Ambulatory Visit: Admitting: Vascular Surgery

## 2024-04-04 VITALS — BP 146/74 | HR 68

## 2024-04-04 DIAGNOSIS — I739 Peripheral vascular disease, unspecified: Secondary | ICD-10-CM | POA: Diagnosis not present

## 2024-04-04 LAB — VAS US ABI WITH/WO TBI
Left ABI: 0.91
Right ABI: 0.96

## 2024-04-04 NOTE — Progress Notes (Signed)
 Patient ID: [name] [name], male   DOB: 1950/09/16, 73 y.o.   MRN: [id number]  Reason for Consult: New Patient (Initial Visit)   Referred by Edna Toribio LABOR, MD  Subjective:     HPI:  [name] [name] is a 73 y.o. male with previously planned for left knee replacement now status post right intertrochanteric femur fracture and is at [facility] recovering.  Currently wheelchair-bound not having any knee pain.  Denies any previous vascular history does have diagnosis of hyperlipidemia hypertension and congestive heart failure.  He seems somewhat confused on discussion today but his family is at bedside able to clarify questioning.  Past Medical History:  Diagnosis Date   Alcohol abuse    Anemia    Arthritis    CHF (congestive heart failure) (HCC)    Cirrhosis of liver (HCC)    Cirrhosis, alcoholic (HCC)    last etoh 3/13-sees dr abran   Colon polyps    Esophageal varices (HCC)    GERD (gastroesophageal reflux disease)    H/O sleep apnea    Hyperlipidemia    Hypertension    Sleep apnea    had surgery to correct snoring 2001   Vocal cord paralysis    left   Family History  Problem Relation Age of Onset   Diabetes Mother    Congestive Heart Failure Mother    Heart disease Father        first MI at age 13, stents, CABG   Diabetes Father    Atrial fibrillation Brother    CAD Brother        MI and stent age 76   Hypertension Brother    Colon cancer Neg Hx    Stomach cancer Neg Hx    Rectal cancer Neg Hx    Past Surgical History:  Procedure Laterality Date   APPENDECTOMY     COLONOSCOPY  2013   HERNIA REPAIR     INSERTION OF MESH  04/21/2012   Procedure: INSERTION OF MESH;  Surgeon: Vicenta LABOR Poli, MD;  Location: [facility];  Service: General;  Laterality: N/A;   INTRAMEDULLARY (IM) NAIL INTERTROCHANTERIC Right 03/06/2024   Procedure: FIXATION, FRACTURE, INTERTROCHANTERIC, WITH INTRAMEDULLARY ROD;  Surgeon: Georgina Ozell LABOR, MD;  Location: [facility] OR;   Service: Orthopedics;  Laterality: Right;   MOUTH SURGERY  7/13   ORIF ANKLE FRACTURE Left 02/12/2014   Procedure: OPEN REDUCTION INTERNAL FIXATION (ORIF) LEFT ANKLE FRACTURE ;  Surgeon: Norleen Armor, MD;  Location: [facility] OR;  Service: Orthopedics;  Laterality: Left;   PERCUTANEOUS PINNING Left 02/12/2014   Procedure: Closed Reduction  PERCUTANEOUS PINNING left great toe;  Surgeon: Norleen Armor, MD;  Location[facility];  Service: Orthopedics;  Laterality: Left;   ROTATOR CUFF REPAIR Bilateral    SHOULDER OPEN ROTATOR CUFF REPAIR     2 on right shoulder, 1 on left shoulder   THROAT SURGERY     surgery to help with snoring   UMBILICAL HERNIA REPAIR  04/21/2012   Procedure: HERNIA REPAIR UMBILICAL ADULT;  Surgeon: Vicenta LABOR Poli, MD;  Location: [facility];  Service: General;  Laterality: N/A;  umbilical hernia repair with mesh   UMBILICAL HERNIA REPAIR     UPPER GI ENDOSCOPY  2013   UVULOPALATOPHARYNGOPLASTY  2001   UVULOPALATOPHARYNGOPLASTY      Short Social History:  Social History   Tobacco Use   Smoking status: Never   Smokeless tobacco: Never  Substance Use Topics   Alcohol  use: No    Comment: per patient has not drank since 2015    Allergies  Allergen Reactions   Adhesive [Tape] Other (See Comments)    REACTION: blisters.  Use paper tape only   Amlodipine  Other (See Comments)    REACTION:  unknown   Amoxicillin Hives    Did it involve swelling of the face/tongue/throat, SOB, or low BP? No Did it involve sudden or severe rash/hives, skin peeling, or any reaction on the inside of your mouth or nose? No Did you need to seek medical attention at a hospital or doctor's office? No When did it last happen? childhood       If all above answers are "NO", may proceed with cephalosporin use.    Atenolol  Other (See Comments)    REACTION:  unknown   Celebrex [Celecoxib] Nausea And Vomiting    Also Vioxx   Cephalosporins Hives   Clindamycin/Lincomycin Hives   Dexon  [Dexamethasone  Sodium Phosphate ] Other (See Comments)    REACTION:  unknown   Doxazosin Mesylate Other (See Comments)    REACTION: unknown   Duraprep [Antiseptic Products, Misc.]    Enalapril Other (See Comments)    REACTION: dry cough   Erythromycin Hives   Hctz [Hydrochlorothiazide ] Other (See Comments)    REACTION: severe sensitivity to light, burning sensation   Iodine     AGENT: Duraprep REACTION:  blisters   Lipitor [Atorvastatin ]    Other     AGENT:Vicryl, cephalnxon REACTION:  body rejects it   Toprol  Xl [Metoprolol  Succinate]    Verapamil Other (See Comments)    REACTION:  Dry cough   Vioxx [Rofecoxib] Hives   Zocor [Simvastatin] Nausea And Vomiting   Latex Rash    Current Outpatient Medications  Medication Sig Dispense Refill   acetaminophen  (TYLENOL ) 325 MG tablet Take 1-2 tablets (325-650 mg total) by mouth every 6 (six) hours as needed for mild pain.     amiodarone  (PACERONE ) 200 MG tablet Take 1 tablet (200 mg total) by mouth daily. 90 tablet 3   atorvastatin  (LIPITOR) 40 MG tablet Take 1 tablet (40 mg total) by mouth daily. 30 tablet 0   bisoprolol  (ZEBETA ) 5 MG tablet Take 1 tablet (5 mg total) by mouth daily. 90 tablet 3   cyclobenzaprine  (FLEXERIL ) 5 MG tablet Take 1 tablet (5 mg total) by mouth 3 (three) times daily as needed for muscle spasms. 30 tablet 0   ezetimibe  (ZETIA ) 10 MG tablet Take 1 tablet (10 mg total) by mouth daily. 90 tablet 3   HYDROcodone -acetaminophen  (NORCO) 7.5-325 MG tablet Take 1 tablet by mouth every 4 (four) hours as needed for moderate pain (pain score 4-6). 30 tablet 0   oxyCODONE  10 MG TABS Take 1 tablet (10 mg total) by mouth every 4 (four) hours as needed for severe pain (pain score 7-10). 30 tablet 0   polyethylene glycol (MIRALAX  / GLYCOLAX ) 17 g packet Take 17 g by mouth daily. 14 each 0   rivaroxaban  (XARELTO ) 10 MG TABS tablet Take 1 tablet (10 mg total) by mouth daily. 21 tablet 0   senna-docusate (SENOKOT-S) 8.6-50 MG  tablet Take 1 tablet by mouth at bedtime as needed for mild constipation. 30 tablet 0   No current facility-administered medications for this visit.   Facility-Administered Medications Ordered in Other Visits  Medication Dose Route Frequency Provider Last Rate Last Admin   etomidate  (AMIDATE ) injection    Anesthesia Intra-op Antonina Ronal PEDLAR, CRNA   20 mg at 02/27/14 [phone]  fentaNYL  (SUBLIMAZE ) injection    Anesthesia Intra-op Antonina Ronal PEDLAR, CRNA   100 mcg at 02/27/14 0545   succinylcholine  (ANECTINE ) injection    Anesthesia Intra-op Bilotta, Mary Z, CRNA   120 mg at 02/27/14 0545    Review of Systems  Constitutional:  Constitutional negative. HENT: HENT negative.  Eyes: Eyes negative.  Respiratory: Respiratory negative.  Cardiovascular: Cardiovascular negative.  GI: Gastrointestinal negative.  Musculoskeletal: Positive for gait problem, leg pain and joint pain.  Skin: Skin negative.  Hematologic: Hematologic/lymphatic negative.  Psychiatric: Psychiatric negative. Positive for depressed mood.        Objective:  Objective   Vitals:   04/04/24 1416  BP: (!) 146/74  Pulse: 68  SpO2: 95%   There is no height or weight on file to calculate BMI.  Physical Exam Cardiovascular:     Rate and Rhythm: Normal rate.     Pulses:          Popliteal pulses are 2+ on the right side and 2+ on the left side.       Dorsalis pedis pulses are 2+ on the right side.       Posterior tibial pulses are 1+ on the left side.  Abdominal:     General: Abdomen is flat.  Skin:    Capillary Refill: Capillary refill takes less than 2 seconds.  Neurological:     General: No focal deficit present.     Mental Status: He is alert.  Psychiatric:        Mood and Affect: Mood normal.     Data: ABI Findings:  +---------+------------------+-----+-----------+--------+  Right   Rt Pressure (mmHg)IndexWaveform   Comment   +---------+------------------+-----+-----------+--------+  Brachial 174                                          +---------+------------------+-----+-----------+--------+  PTA     150               0.78 multiphasic          +---------+------------------+-----+-----------+--------+  DP      185               0.96 triphasic            +---------+------------------+-----+-----------+--------+  Great Toe164               0.85 Normal               +---------+------------------+-----+-----------+--------+   +---------+------------------+-----+-----------+-------+  Left    Lt Pressure (mmHg)IndexWaveform   Comment  +---------+------------------+-----+-----------+-------+  Brachial 193                                        +---------+------------------+-----+-----------+-------+  PTA     176               0.91 multiphasic         +---------+------------------+-----+-----------+-------+  DP      139               0.72 multiphasic         +---------+------------------+-----+-----------+-------+  Great Toe83                0.43 Abnormal            +---------+------------------+-----+-----------+-------+   +-------+-----------+-----------+------------+------------+  ABI/TBIToday's ABIToday's TBIPrevious ABIPrevious  TBI  +-------+-----------+-----------+------------+------------+  Right 0.96       0.85       0.96        [place]            +-------+-----------+-----------+------------+------------+  Left  0.91       0.43       0.88        0.56          +-------+-----------+-----------+------------+------------+       Summary:  Right: Resting right ankle-brachial index is within normal range. The  right toe-brachial index is normal.    Left: Resting left ankle-brachial index indicates mild left lower  extremity arterial disease. The left toe-brachial index is abnormal.  Left toe pressure is >60 mmHg which suggests adequate perfusion for  healing.       Assessment/Plan:    \73 year old male  initially sent for evaluation of blood flow prior to left knee replacement now with right sided femur fracture from a fall in recovery.  Blood flow is adequate for healing any orthopedic surgery is necessary.  He can see me on an as needed basis.    Penne Lonni Colorado MD [facility]

## 2024-04-12 ENCOUNTER — Encounter: Payer: Self-pay | Admitting: Adult Health

## 2024-04-12 ENCOUNTER — Ambulatory Visit: Admitting: Adult Health

## 2024-04-12 VITALS — BP 154/57 | HR 50 | Ht 67.0 in | Wt 200.0 lb

## 2024-04-12 DIAGNOSIS — I10 Essential (primary) hypertension: Secondary | ICD-10-CM

## 2024-04-12 DIAGNOSIS — I639 Cerebral infarction, unspecified: Secondary | ICD-10-CM

## 2024-04-12 DIAGNOSIS — E78019 Familial hypercholesterolemia, unspecified: Secondary | ICD-10-CM | POA: Diagnosis not present

## 2024-04-12 DIAGNOSIS — G4733 Obstructive sleep apnea (adult) (pediatric): Secondary | ICD-10-CM

## 2024-04-12 NOTE — Progress Notes (Signed)
 PATIENT: [name] [name] DOB: Apr 06, 1951  REASON FOR VISIT: follow up HISTORY FROM: patient PRIMARY NEUROLOGIST: Dr. Rosemarie  Chief Complaint  Patient presents with   RM 4     Patient is here with glenda and mich. For stroke follow-up - no concerns     HISTORY OF PRESENT ILLNESS: Today   [name] [name] is a 73 y.o. male here for hospital follow-up for  Stroke s/p TNK - punctate left occipital cortex infarct, etiology: Likely new diagnosed PAF/aflutter.  He reports that he has not noticed any residual deficits from the stroke.  He still has trouble ambulating due to femur fracture.  He initially went into the hospital after a fall at home fractured his femur.  Patient was found to be in PAF/A-flutter.  He was started on Xarelto .  Has already followed up with cardiology who is adjusting his BP pain medication and kept him on Xarelto .  His blood pressure is slightly elevated today.  Encouraged him to keep a log of his blood pressure once he is back at home.  If it remains elevated he should make cardiology aware.  Patient remains on Zetia  and Lipitor for his cholesterol.  Hemoglobin A1c was in normal range.  Patient reports that he has been told he has sleep apnea but has never used a CPAP.  Unsure when his last sleep study was.  He sleeps alone so unsure if he snores.  Does report that he does not sleep well waking frequently.  He will be discharged from [facility] this week.  His plan is to return home alone.  But he does have family that will check on him frequently.  Imaging:   MRI brain: IMPRESSION: 1. Very Small acute cortical infarct left superior occipital lobe. No intracranial hemorrhage or mass effect. 2. Chronic small and medium sized vessel ischemia, moderate to severe for age.  CTA head/neck:   IMPRESSION: 1. No emergent large vessel occlusion. 2. Atherosclerotic disease at both carotid bifurcations without hemodynamically significant stenosis.     HISTORY (copied from  Hospital)Patient was given TNK to treat possible stroke.  He was found on MRI to have a small left superior occipital cortical infarct, which is likely incidental.  He did have a right femur fracture from his fall and underwent repair on 11/4.  He was also found to have a flutter and was placed on amiodarone .  Anticoagulants were held due to severe anemia, for which patient received 1 unit of packed red blood cells.  He will receive Xarelto  10 mg daily as postop DVT prophylaxis and will follow-up with cardiology for decision on anticoagulation within a month.  He is now stable and ready to be discharged to facility for rehab.   Stroke s/p TNK - punctate left occipital cortex infarct, etiology: Likely new diagnosed PAF/aflutter Code Stroke  CT head No acute abnormality. ASPECTS 10.  Old left frontal infarct and chronic ischemic white matter changes  CTA head & neck No LVO Atherosclerotic disease at both carotid bifurcations without hemodynamically significant stenosis  MRI small left superior occipital cortical infarct 2D Echo EF > 75%.  Mild LVH with grade 1 diastolic dysfunction BLE DVT US : Negative for DVT LDL 127 HgbA1c 5.3 VTE prophylaxis - change Xarelto  10mg  daily to Heparin  SQ Q8h No antithrombotic prior to admission, now on Xarelto  10 daily for DVT prophylaxis for 30-day course post hip surgery. Will follow up with cardiology within one month to decide on antithrombotic regimen.  Therapy recommendations:  SNF Disposition: Pending   PAF / Aflutter with RVR EKG showed afib with RVR Cardiology on board, concerning for aflutter New diagnosis Telemetry On p.o. amiodarone  now on Xarelto  10 daily for DVT prophylaxis for 30-day course post hip surgery. Will follow up with cardiology within one month to decide on antithrombotic regimen.   S/p unwitnessed fall Acute right femur fracture Pelvis x-ray Right femoral intertrochanteric fracture with displacement and varus angulation. CT chest  abdomen pelvis:  Moderately displaced and comminuted intertrochanteric fracture of the proximal right femur, acute. Old bilateral rib fractures, chronic. Chronic L1 vertebral body compression fracture, chronic. Status post appendectomy. Aortic atherosclerosis. Coronary artery calcifications. Diverticulosis of the descending and sigmoid colon without evidence of inflammation. Possible bilateral nephrolithiasis. Ortho consulted S/p Right hip fracture repair on 11//2025 Pain controlled   Severe anemia Hb 15.0--11.0--7.3--7.7--8.2 No overt bleeding CBC monitoring Received a transfusion of 1 unit of packed red blood cells 11/11   Hypertension Home meds: Amlodipine  5 mg, propranolol  20 mg Stable On bisoprolol   Blood Pressure Goal: BP less than 180/105  Long-term BP goal normotensive   Hyperlipidemia Home meds: Zetia  10 mg,  resumed in hospital LDL 127, goal < 70 Atorvastatin  40 mg added  Continue both meds at discharge   Substance Abuse History of EtOH abuse History of liver cirrhosis, esophageal varices UDS positive for THC TOC consult for cessation placed   AKI, improving Cr 1.36 - 1.29 - 1.19 Encourage p.o. intake BMP monitoring   Other Stroke Risk Factors Obesity, Body mass index is 31.95 kg/m., BMI >/= 30 associated with increased stroke risk, recommend weight loss, diet and exercise as appropriate  Silent stroke, Old left frontal infarct on brain imaging CHF Obstructive sleep apnea   Other active problems History of left vocal cord paralyzed this      REVIEW OF SYSTEMS: Out of a complete 14 system review of symptoms, the patient complains only of the following symptoms, and all other reviewed systems are negative.  ALLERGIES: Allergies[1]  HOME MEDICATIONS: Outpatient Medications Prior to Visit  Medication Sig Dispense Refill   acetaminophen  (TYLENOL ) 325 MG tablet Take 1-2 tablets (325-650 mg total) by mouth every 6 (six) hours as needed for mild pain.      amiodarone  (PACERONE ) 200 MG tablet Take 1 tablet (200 mg total) by mouth daily. 90 tablet 3   atorvastatin  (LIPITOR) 40 MG tablet Take 1 tablet (40 mg total) by mouth daily. 30 tablet 0   bisoprolol  (ZEBETA ) 5 MG tablet Take 1 tablet (5 mg total) by mouth daily. 90 tablet 3   cyclobenzaprine  (FLEXERIL ) 5 MG tablet Take 1 tablet (5 mg total) by mouth 3 (three) times daily as needed for muscle spasms. 30 tablet 0   ezetimibe  (ZETIA ) 10 MG tablet Take 1 tablet (10 mg total) by mouth daily. 90 tablet 3   HYDROcodone -acetaminophen  (NORCO) 7.5-325 MG tablet Take 1 tablet by mouth every 4 (four) hours as needed for moderate pain (pain score 4-6). 30 tablet 0   oxyCODONE  10 MG TABS Take 1 tablet (10 mg total) by mouth every 4 (four) hours as needed for severe pain (pain score 7-10). 30 tablet 0   polyethylene glycol (MIRALAX  / GLYCOLAX ) 17 g packet Take 17 g by mouth daily. 14 each 0   rivaroxaban  (XARELTO ) 10 MG TABS tablet Take 1 tablet (10 mg total) by mouth daily. 21 tablet 0   senna-docusate (SENOKOT-S) 8.6-50 MG tablet Take 1 tablet by mouth at bedtime as needed for mild constipation. 30  tablet 0   Facility-Administered Medications Prior to Visit  Medication Dose Route Frequency Provider Last Rate Last Admin   etomidate  (AMIDATE ) injection    Anesthesia Intra-op Antonina Ronal PEDLAR, CRNA   20 mg at 02/27/14 0545   fentaNYL  (SUBLIMAZE ) injection    Anesthesia Intra-op Antonina Ronal PEDLAR, CRNA   100 mcg at 02/27/14 0545   succinylcholine  (ANECTINE ) injection    Anesthesia Intra-op Antonina Ronal PEDLAR, CRNA   120 mg at 02/27/14 0545    PAST MEDICAL HISTORY: Past Medical History:  Diagnosis Date   Alcohol abuse    Anemia    Arthritis    CHF (congestive heart failure) (HCC)    Cirrhosis of liver (HCC)    Cirrhosis, alcoholic (HCC)    last etoh 3/13-sees dr abran   Colon polyps    Esophageal varices (HCC)    GERD (gastroesophageal reflux disease)    H/O sleep apnea    Hyperlipidemia     Hypertension    Sleep apnea    had surgery to correct snoring 2001   Vocal cord paralysis    left    PAST SURGICAL HISTORY: Past Surgical History:  Procedure Laterality Date   APPENDECTOMY     COLONOSCOPY  2013   HERNIA REPAIR     INSERTION OF MESH  04/21/2012   Procedure: INSERTION OF MESH;  Surgeon: Vicenta DELENA Poli, MD;  Location: [facility];  Service: General;  Laterality: N/A;   INTRAMEDULLARY (IM) NAIL INTERTROCHANTERIC Right 03/06/2024   Procedure: FIXATION, FRACTURE, INTERTROCHANTERIC, WITH INTRAMEDULLARY ROD;  Surgeon: Georgina Ozell DELENA, MD;  Location: [facility] OR;  Service: Orthopedics;  Laterality: Right;   MOUTH SURGERY  7/13   ORIF ANKLE FRACTURE Left 02/12/2014   Procedure: OPEN REDUCTION INTERNAL FIXATION (ORIF) LEFT ANKLE FRACTURE ;  Surgeon: Norleen Armor, MD;  Location: [facility] OR;  Service: Orthopedics;  Laterality: Left;   PERCUTANEOUS PINNING Left 02/12/2014   Procedure: Closed Reduction  PERCUTANEOUS PINNING left great toe;  Surgeon: Norleen Armor, MD;  Location: [facility];  Service: Orthopedics;  Laterality: Left;   ROTATOR CUFF REPAIR Bilateral    SHOULDER OPEN ROTATOR CUFF REPAIR     2 on right shoulder, 1 on left shoulder   THROAT SURGERY     surgery to help with snoring   UMBILICAL HERNIA REPAIR  04/21/2012   Procedure: HERNIA REPAIR UMBILICAL ADULT;  Surgeon: Vicenta DELENA Poli, MD;  Location: [facility];  Service: General;  Laterality: N/A;  umbilical hernia repair with mesh   UMBILICAL HERNIA REPAIR     UPPER GI ENDOSCOPY  2013   UVULOPALATOPHARYNGOPLASTY  2001   UVULOPALATOPHARYNGOPLASTY      FAMILY HISTORY: Family History  Problem Relation Age of Onset   Diabetes Mother    Congestive Heart Failure Mother    Heart disease Father        first MI at age 55, stents, CABG   Diabetes Father    Atrial fibrillation Brother    CAD Brother        MI and stent age 83   Hypertension Brother    Colon cancer Neg Hx    Stomach cancer Neg Hx     Rectal cancer Neg Hx     SOCIAL HISTORY: Social History   Socioeconomic History   Marital status: Widowed    Spouse name: Not on file   Number of children: Not on file   Years of education: Not on file   Highest education level: Not  on file  Occupational History   Not on file  Tobacco Use   Smoking status: Never   Smokeless tobacco: Never  Vaping Use   Vaping status: Never Used  Substance and Sexual Activity   Alcohol use: No    Comment: per patient has not drank since 2015   Drug use: Yes    Types: Marijuana    Comment: occasionally per patient    Sexual activity: Not on file  Other Topics Concern   Not on file  Social History Narrative   ** Merged History Encounter **       caffeine - some coke weekly    Social Drivers of Health   Tobacco Use: Low Risk (04/04/2024)   Patient History    Smoking Tobacco Use: Never    Smokeless Tobacco Use: Never    Passive Exposure: Not on file  Financial Resource Strain: Not on file  Food Insecurity: No Food Insecurity (03/05/2024)   Epic    Worried About Programme Researcher, Broadcasting/film/video in the Last Year: Never true    Ran Out of Food in the Last Year: Never true  Transportation Needs: Patient Declined (03/05/2024)   Epic    Lack of Transportation (Medical): Patient declined    Lack of Transportation (Non-Medical): Patient declined  Physical Activity: Not on file  Stress: Not on file  Social Connections: Patient Declined (03/05/2024)   Social Connection and Isolation Panel    Frequency of Communication with Friends and Family: Patient declined    Frequency of Social Gatherings with Friends and Family: Patient declined    Attends Religious Services: Patient declined    Active Member of Clubs or Organizations: Patient declined    Attends Banker Meetings: Patient declined    Marital Status: Patient declined  Intimate Partner Violence: Not At Risk (03/05/2024)   Epic    Fear of Current or Ex-Partner: No    Emotionally Abused:  No    Physically Abused: No    Sexually Abused: No  Depression (PHQ2-9): Not on file  Alcohol Screen: Not on file  Housing: Unknown (03/05/2024)   Epic    Unable to Pay for Housing in the Last Year: No    Number of Times Moved in the Last Year: 0    Homeless in the Last Year: Patient declined  Utilities: Patient Declined (03/05/2024)   Epic    Threatened with loss of utilities: Patient declined  Health Literacy: Not on file      PHYSICAL EXAM  Vitals:   04/12/24 0853  BP: (!) 154/57  Pulse: (!) 50  Weight: 200 lb (90.7 kg)  Height: 5' 7 (1.702 m)   Body mass index is 31.32 kg/m.  Generalized: Well developed, in no acute distress   Neurological examination  Mentation: Alert oriented to time, place, history taking. Follows all commands speech and language fluent Cranial nerve II-XII: Pupils were equal round reactive to light. Extraocular movements were full, visual field were full on confrontational test. Facial sensation and strength were normal. Uvula tongue midline. Head turning and shoulder shrug  were normal and symmetric. Motor: The motor testing reveals 5 over 5 strength of all 4 extremities with the exception of slightly weaker in the right upper extremity due to rotator cuff surgery.  Good symmetric motor tone is noted throughout.  Sensory: Sensory testing is intact to soft touch on all 4 extremities. No evidence of extinction is noted.  Coordination: Cerebellar testing reveals good finger-nose-finger and heel-to-shin bilaterally.  Gait  and station: Patient is in a wheelchair today.  Did not ambulate due to not having a walker with him   DIAGNOSTIC DATA (LABS, IMAGING, TESTING) - I reviewed patient records, labs, notes, testing and imaging myself where available.  Lab Results  Component Value Date   WBC 14.3 (H) 03/19/2024   HGB 9.1 (L) 03/19/2024   HCT 29.3 (L) 03/19/2024   MCV 98 (H) 03/19/2024   PLT 487 (H) 03/19/2024      Component Value Date/Time   NA  134 (L) 03/14/2024 0111   NA 137 10/07/2023 1311   K 4.5 03/14/2024 0111   CL 104 03/14/2024 0111   CO2 21 (L) 03/14/2024 0111   GLUCOSE 120 (H) 03/14/2024 0111   BUN 26 (H) 03/14/2024 0111   BUN 22 10/07/2023 1311   CREATININE 1.19 03/14/2024 0111   CALCIUM  8.1 (L) 03/14/2024 0111   PROT 5.4 (L) 03/10/2024 2253   ALBUMIN  2.5 (L) 03/11/2024 0102   AST 97 (H) 03/10/2024 2253   ALT 105 (H) 03/10/2024 2253   ALKPHOS 98 03/10/2024 2253   BILITOT 1.1 03/10/2024 2253   GFRNONAA >60 03/14/2024 0111   GFRAA >60 09/20/2019 1026   Lab Results  Component Value Date   CHOL 195 03/04/2024   HDL 37 (L) 03/04/2024   LDLCALC 127 (H) 03/04/2024   LDLDIRECT 58 03/19/2024   TRIG 157 (H) 03/04/2024   CHOLHDL 5.3 03/04/2024   Lab Results  Component Value Date   HGBA1C 5.3 03/03/2024   No results found for: VITAMINB12 Lab Results  Component Value Date   TSH 0.285 (L) 07/01/2018      ASSESSMENT AND PLAN 73 y.o. year old male  has a past medical history of Alcohol abuse, Anemia, Arthritis, CHF (congestive heart failure) (HCC), Cirrhosis of liver (HCC), Cirrhosis, alcoholic (HCC), Colon polyps, Esophageal varices (HCC), GERD (gastroesophageal reflux disease), H/O sleep apnea, Hyperlipidemia, Hypertension, Sleep apnea, and Vocal cord paralysis. here with:  Stroke s/p TNK - punctate left occipital cortex infarct, etiology: Likely new diagnosed PAF/aflutter Hyperlipidemia Hypertension History of sleep apnea   Continue Xarelto  and  for secondary stroke prevention-currently managed by cardiology Discussed secondary stroke prevention measures and importance of close PCP follow up for aggressive stroke risk factor management. I have gone over the pathophysiology of stroke, warning signs and symptoms, risk factors and their management in some detail with instructions to go to the closest emergency room for symptoms of concern. HTN: BP goal <130/90.  Slightly elevated today HLD: LDL goal <70.  Recent LDL 127.  DMII: A1c goal<7.0. Recent A1c 5.3.  Encouraged patient to monitor diet and encouraged exercise Home sleep test ordered to evaluate for sleep apnea FU with our office 6-8 months or sooner if needed  Orders Placed This Encounter  Procedures   Home sleep test      Duwaine Russell, MSN, NP-C 04/12/2024, 9:38 AM [facility] [address] [place], [place] [address] [phone]      [1]  Allergies Allergen Reactions   Adhesive [Tape] Other (See Comments)    REACTION: blisters.  Use paper tape only   Amlodipine  Other (See Comments)    REACTION:  unknown   Amoxicillin Hives    Did it involve swelling of the face/tongue/throat, SOB, or low BP? No Did it involve sudden or severe rash/hives, skin peeling, or any reaction on the inside of your mouth or nose? No Did you need to seek medical attention at a hospital or doctor's office? No  When did it last happen? childhood       If all above answers are NO, may proceed with cephalosporin use.    Atenolol  Other (See Comments)    REACTION:  unknown   Celebrex [Celecoxib] Nausea And Vomiting    Also Vioxx   Cephalosporins Hives   Clindamycin/Lincomycin Hives   Dexon [Dexamethasone  Sodium Phosphate ] Other (See Comments)    REACTION:  unknown   Doxazosin Mesylate Other (See Comments)    REACTION: unknown   Duraprep [Antiseptic Products, Misc.]    Enalapril Other (See Comments)    REACTION: dry cough   Erythromycin Hives   Hctz [Hydrochlorothiazide ] Other (See Comments)    REACTION: severe sensitivity to light, burning sensation   Iodine     AGENT: Duraprep REACTION:  blisters   Lipitor [Atorvastatin ]    Other     AGENT:Vicryl, cephalnxon REACTION:  body rejects it   Toprol  Xl [Metoprolol  Succinate]    Verapamil Other (See Comments)    REACTION:  Dry cough   Vioxx [Rofecoxib] Hives   Zocor [Simvastatin] Nausea And Vomiting   Latex Rash

## 2024-04-12 NOTE — Patient Instructions (Signed)
 Your Plan:  Your Plan:  Continue xarelto    Blood pressure goal <130/90 Cholesterol LDL goal <70 Diabetes goal A1c <7 Monitor diet and try to exercise  Sleep test ordered   Thank you for coming to see us  at [facility]. I hope we have been able to provide you high quality care today.  You may receive a patient satisfaction survey over the next few weeks. We would appreciate your feedback and comments so that we may continue to improve ourselves and the health of our patients.

## 2024-04-13 ENCOUNTER — Other Ambulatory Visit (HOSPITAL_COMMUNITY): Payer: Self-pay

## 2024-04-13 MED ORDER — RIVAROXABAN 10 MG PO TABS
10.0000 mg | ORAL_TABLET | Freq: Every evening | ORAL | 0 refills | Status: DC
  Filled 2024-04-13: qty 60, 60d supply, fill #0

## 2024-04-15 ENCOUNTER — Emergency Department (HOSPITAL_BASED_OUTPATIENT_CLINIC_OR_DEPARTMENT_OTHER)

## 2024-04-15 ENCOUNTER — Other Ambulatory Visit: Payer: Self-pay

## 2024-04-15 ENCOUNTER — Inpatient Hospital Stay (HOSPITAL_COMMUNITY)

## 2024-04-15 ENCOUNTER — Inpatient Hospital Stay (HOSPITAL_BASED_OUTPATIENT_CLINIC_OR_DEPARTMENT_OTHER)
Admission: EM | Admit: 2024-04-15 | Discharge: 2024-04-20 | DRG: 683 | Disposition: A | Discharge status: Home Health | Attending: Internal Medicine | Admitting: Internal Medicine

## 2024-04-15 ENCOUNTER — Encounter (HOSPITAL_BASED_OUTPATIENT_CLINIC_OR_DEPARTMENT_OTHER): Payer: Self-pay | Admitting: Emergency Medicine

## 2024-04-15 DIAGNOSIS — I110 Hypertensive heart disease with heart failure: Secondary | ICD-10-CM | POA: Diagnosis present

## 2024-04-15 DIAGNOSIS — E7800 Pure hypercholesterolemia, unspecified: Secondary | ICD-10-CM | POA: Diagnosis present

## 2024-04-15 DIAGNOSIS — K7030 Alcoholic cirrhosis of liver without ascites: Secondary | ICD-10-CM | POA: Diagnosis present

## 2024-04-15 DIAGNOSIS — Z860100 Personal history of colon polyps, unspecified: Secondary | ICD-10-CM | POA: Diagnosis not present

## 2024-04-15 DIAGNOSIS — K219 Gastro-esophageal reflux disease without esophagitis: Secondary | ICD-10-CM | POA: Diagnosis present

## 2024-04-15 DIAGNOSIS — I5032 Chronic diastolic (congestive) heart failure: Secondary | ICD-10-CM | POA: Diagnosis present

## 2024-04-15 DIAGNOSIS — Z9181 History of falling: Secondary | ICD-10-CM | POA: Diagnosis not present

## 2024-04-15 DIAGNOSIS — N179 Acute kidney failure, unspecified: Principal | ICD-10-CM | POA: Diagnosis present

## 2024-04-15 DIAGNOSIS — Z883 Allergy status to other anti-infective agents status: Secondary | ICD-10-CM

## 2024-04-15 DIAGNOSIS — E785 Hyperlipidemia, unspecified: Secondary | ICD-10-CM | POA: Diagnosis not present

## 2024-04-15 DIAGNOSIS — I1A0 Resistant hypertension: Secondary | ICD-10-CM | POA: Diagnosis present

## 2024-04-15 DIAGNOSIS — I5020 Unspecified systolic (congestive) heart failure: Secondary | ICD-10-CM | POA: Diagnosis present

## 2024-04-15 DIAGNOSIS — I480 Paroxysmal atrial fibrillation: Secondary | ICD-10-CM | POA: Diagnosis present

## 2024-04-15 DIAGNOSIS — E860 Dehydration: Secondary | ICD-10-CM | POA: Diagnosis present

## 2024-04-15 DIAGNOSIS — D649 Anemia, unspecified: Secondary | ICD-10-CM | POA: Diagnosis present

## 2024-04-15 DIAGNOSIS — R600 Localized edema: Secondary | ICD-10-CM | POA: Diagnosis present

## 2024-04-15 DIAGNOSIS — Z880 Allergy status to penicillin: Secondary | ICD-10-CM

## 2024-04-15 DIAGNOSIS — Z91148 Patient's other noncompliance with medication regimen for other reason: Secondary | ICD-10-CM | POA: Diagnosis not present

## 2024-04-15 DIAGNOSIS — R339 Retention of urine, unspecified: Secondary | ICD-10-CM | POA: Diagnosis present

## 2024-04-15 DIAGNOSIS — M1990 Unspecified osteoarthritis, unspecified site: Secondary | ICD-10-CM | POA: Diagnosis present

## 2024-04-15 DIAGNOSIS — Z8249 Family history of ischemic heart disease and other diseases of the circulatory system: Secondary | ICD-10-CM

## 2024-04-15 DIAGNOSIS — Z8673 Personal history of transient ischemic attack (TIA), and cerebral infarction without residual deficits: Secondary | ICD-10-CM

## 2024-04-15 DIAGNOSIS — I5030 Unspecified diastolic (congestive) heart failure: Secondary | ICD-10-CM | POA: Diagnosis not present

## 2024-04-15 DIAGNOSIS — Z833 Family history of diabetes mellitus: Secondary | ICD-10-CM

## 2024-04-15 DIAGNOSIS — I495 Sick sinus syndrome: Secondary | ICD-10-CM | POA: Diagnosis present

## 2024-04-15 DIAGNOSIS — Z881 Allergy status to other antibiotic agents status: Secondary | ICD-10-CM

## 2024-04-15 DIAGNOSIS — G4730 Sleep apnea, unspecified: Secondary | ICD-10-CM | POA: Diagnosis present

## 2024-04-15 DIAGNOSIS — Z9109 Other allergy status, other than to drugs and biological substances: Secondary | ICD-10-CM

## 2024-04-15 DIAGNOSIS — Z7901 Long term (current) use of anticoagulants: Secondary | ICD-10-CM

## 2024-04-15 DIAGNOSIS — Z91040 Latex allergy status: Secondary | ICD-10-CM

## 2024-04-15 DIAGNOSIS — R609 Edema, unspecified: Secondary | ICD-10-CM | POA: Diagnosis not present

## 2024-04-15 DIAGNOSIS — Z888 Allergy status to other drugs, medicaments and biological substances status: Secondary | ICD-10-CM

## 2024-04-15 DIAGNOSIS — K7460 Unspecified cirrhosis of liver: Secondary | ICD-10-CM | POA: Diagnosis present

## 2024-04-15 DIAGNOSIS — Z79899 Other long term (current) drug therapy: Secondary | ICD-10-CM

## 2024-04-15 DIAGNOSIS — I350 Nonrheumatic aortic (valve) stenosis: Secondary | ICD-10-CM | POA: Diagnosis present

## 2024-04-15 DIAGNOSIS — I10 Essential (primary) hypertension: Secondary | ICD-10-CM | POA: Diagnosis not present

## 2024-04-15 DIAGNOSIS — Z886 Allergy status to analgesic agent status: Secondary | ICD-10-CM

## 2024-04-15 DIAGNOSIS — Z23 Encounter for immunization: Secondary | ICD-10-CM

## 2024-04-15 LAB — COMPREHENSIVE METABOLIC PANEL WITH GFR
ALT: 19 U/L (ref 0–44)
AST: 19 U/L (ref 15–41)
Albumin: 3.7 g/dL (ref 3.5–5.0)
Alkaline Phosphatase: 129 U/L — ABNORMAL HIGH (ref 38–126)
Anion gap: 13 (ref 5–15)
BUN: 57 mg/dL — ABNORMAL HIGH (ref 8–23)
CO2: 21 mmol/L — ABNORMAL LOW (ref 22–32)
Calcium: 8.8 mg/dL — ABNORMAL LOW (ref 8.9–10.3)
Chloride: 103 mmol/L (ref 98–111)
Creatinine, Ser: 2.67 mg/dL — ABNORMAL HIGH (ref 0.61–1.24)
GFR, Estimated: 24 mL/min — ABNORMAL LOW (ref >60)
Glucose, Bld: 100 mg/dL — ABNORMAL HIGH (ref 70–99)
Potassium: 4.7 mmol/L (ref 3.5–5.1)
Sodium: 137 mmol/L (ref 135–145)
Total Bilirubin: 1 mg/dL (ref 0.0–1.2)
Total Protein: 6.8 g/dL (ref 6.5–8.1)

## 2024-04-15 LAB — CBC WITH DIFFERENTIAL/PLATELET
Abs Immature Granulocytes: 0.05 K/uL (ref 0.00–0.07)
Basophils Absolute: 0.1 K/uL (ref 0.0–0.1)
Basophils Relative: 1 %
Eosinophils Absolute: 0.5 K/uL (ref 0.0–0.5)
Eosinophils Relative: 5 %
HCT: 27.4 % — ABNORMAL LOW (ref 39.0–52.0)
Hemoglobin: 8.5 g/dL — ABNORMAL LOW (ref 13.0–17.0)
Immature Granulocytes: 1 %
Lymphocytes Relative: 16 %
Lymphs Abs: 1.4 K/uL (ref 0.7–4.0)
MCH: 30.6 pg (ref 26.0–34.0)
MCHC: 31 g/dL (ref 30.0–36.0)
MCV: 98.6 fL (ref 80.0–100.0)
Monocytes Absolute: 0.9 K/uL (ref 0.1–1.0)
Monocytes Relative: 10 %
Neutro Abs: 5.7 K/uL (ref 1.7–7.7)
Neutrophils Relative %: 67 %
Platelets: 227 K/uL (ref 150–400)
RBC: 2.78 MIL/uL — ABNORMAL LOW (ref 4.22–5.81)
RDW: 15 % (ref 11.5–15.5)
WBC: 8.6 K/uL (ref 4.0–10.5)
nRBC: 0 % (ref 0.0–0.2)

## 2024-04-15 LAB — URINALYSIS, ROUTINE W REFLEX MICROSCOPIC
Bacteria, UA: NONE SEEN
Bilirubin Urine: NEGATIVE
Glucose, UA: NEGATIVE mg/dL
Hgb urine dipstick: NEGATIVE
Ketones, ur: NEGATIVE mg/dL
Leukocytes,Ua: NEGATIVE
Nitrite: NEGATIVE
Protein, ur: 30 mg/dL — AB
Specific Gravity, Urine: 1.022 (ref 1.005–1.030)
pH: 5 (ref 5.0–8.0)

## 2024-04-15 LAB — CBC
HCT: 27.4 % — ABNORMAL LOW (ref 39.0–52.0)
Hemoglobin: 8.5 g/dL — ABNORMAL LOW (ref 13.0–17.0)
MCH: 30.5 pg (ref 26.0–34.0)
MCHC: 31 g/dL (ref 30.0–36.0)
MCV: 98.2 fL (ref 80.0–100.0)
Platelets: 224 K/uL (ref 150–400)
RBC: 2.79 MIL/uL — ABNORMAL LOW (ref 4.22–5.81)
RDW: 15 % (ref 11.5–15.5)
WBC: 9.4 K/uL (ref 4.0–10.5)
nRBC: 0 % (ref 0.0–0.2)

## 2024-04-15 LAB — CREATININE, SERUM
Creatinine, Ser: 2.3 mg/dL — ABNORMAL HIGH (ref 0.61–1.24)
GFR, Estimated: 29 mL/min — ABNORMAL LOW (ref >60)

## 2024-04-15 MED ORDER — BISOPROLOL FUMARATE 5 MG PO TABS
5.0000 mg | ORAL_TABLET | Freq: Every day | ORAL | Status: DC
  Administered 2024-04-16 – 2024-04-18 (×3): 5 mg via ORAL
  Filled 2024-04-15 (×4): qty 1

## 2024-04-15 MED ORDER — ONDANSETRON HCL 4 MG/2ML IJ SOLN: 4.0000 mg | Freq: Four times a day (QID) | INTRAMUSCULAR | Status: DC | PRN

## 2024-04-15 MED ORDER — PNEUMOCOCCAL 20-VAL CONJ VACC 0.5 ML IM SUSY
0.5000 mL | PREFILLED_SYRINGE | INTRAMUSCULAR | Status: AC
  Administered 2024-04-16: 09:00:00 0.5 mL via INTRAMUSCULAR
  Filled 2024-04-15: qty 0.5

## 2024-04-15 MED ORDER — SODIUM CHLORIDE 0.9 % IV SOLN: INTRAVENOUS | Status: AC

## 2024-04-15 MED ORDER — BISACODYL 5 MG PO TBEC: 5.0000 mg | DELAYED_RELEASE_TABLET | Freq: Every day | ORAL | Status: DC | PRN

## 2024-04-15 MED ORDER — HEPARIN SODIUM (PORCINE) 5000 UNIT/ML IJ SOLN: 5000.0000 [IU] | Freq: Three times a day (TID) | INTRAMUSCULAR | Status: DC

## 2024-04-15 MED ORDER — OXYCODONE HCL 5 MG PO TABS: 5.0000 mg | ORAL_TABLET | ORAL | Status: DC | PRN

## 2024-04-15 MED ORDER — EZETIMIBE 10 MG PO TABS
10.0000 mg | ORAL_TABLET | Freq: Every day | ORAL | Status: DC
  Administered 2024-04-16 – 2024-04-20 (×5): 10 mg via ORAL
  Filled 2024-04-15 (×5): qty 1

## 2024-04-15 MED ORDER — OXYCODONE HCL 5 MG PO TABS
10.0000 mg | ORAL_TABLET | ORAL | Status: DC | PRN
  Administered 2024-04-15 – 2024-04-16 (×3): 10 mg via ORAL
  Filled 2024-04-15 (×3): qty 2

## 2024-04-15 MED ORDER — AMIODARONE HCL 200 MG PO TABS
200.0000 mg | ORAL_TABLET | Freq: Every day | ORAL | Status: DC
  Administered 2024-04-16 – 2024-04-18 (×3): 200 mg via ORAL
  Filled 2024-04-15 (×3): qty 1

## 2024-04-15 MED ORDER — HYDRALAZINE HCL 20 MG/ML IJ SOLN
10.0000 mg | Freq: Four times a day (QID) | INTRAMUSCULAR | Status: DC | PRN
  Administered 2024-04-17 – 2024-04-19 (×4): 10 mg via INTRAVENOUS
  Filled 2024-04-15 (×3): qty 1

## 2024-04-15 MED ORDER — ACETAMINOPHEN 325 MG PO TABS: 650.0000 mg | ORAL_TABLET | Freq: Four times a day (QID) | ORAL | Status: DC | PRN

## 2024-04-15 MED ORDER — POLYVINYL ALCOHOL 1.4 % OP SOLN
1.0000 [drp] | OPHTHALMIC | Status: DC | PRN
  Administered 2024-04-15: 1 [drp] via OPHTHALMIC
  Filled 2024-04-15: qty 15

## 2024-04-15 MED ORDER — ACETAMINOPHEN 650 MG RE SUPP: 650.0000 mg | Freq: Four times a day (QID) | RECTAL | Status: DC | PRN

## 2024-04-15 MED ORDER — RIVAROXABAN 10 MG PO TABS
10.0000 mg | ORAL_TABLET | Freq: Every day | ORAL | Status: DC
  Administered 2024-04-16 – 2024-04-20 (×5): 10 mg via ORAL
  Filled 2024-04-15 (×6): qty 1

## 2024-04-15 MED ORDER — ATORVASTATIN CALCIUM 40 MG PO TABS
40.0000 mg | ORAL_TABLET | Freq: Every day | ORAL | Status: DC
  Administered 2024-04-16 – 2024-04-20 (×5): 40 mg via ORAL
  Filled 2024-04-15 (×5): qty 1

## 2024-04-15 MED ORDER — POLYETHYLENE GLYCOL 3350 17 G PO PACK: 17.0000 g | PACK | Freq: Every day | ORAL | Status: DC | PRN

## 2024-04-15 NOTE — Assessment & Plan Note (Addendum)
 Bedside assessment by ED RN reported bilateral lower extremity edema +2 pitting but symmetric.  An RLE Doppler ultrasound was negative for DVT.  This could be secondary to oliguria and renal failure.  versus decompensated HFpEF.  Also history of cirrhosis but albumin  is normal 3.7 on labs today. -- Due to AKI, hold any diuresis for now

## 2024-04-15 NOTE — Assessment & Plan Note (Signed)
 Hemoglobin 8.5 on admission is improved from a low of 7.1 postoperatively in November after his femur fracture.  No recent evidence of bleeding reported. --Monitor CBC

## 2024-04-15 NOTE — Assessment & Plan Note (Signed)
 Rate controlled on admission. --Resume Xarelto , beta-blocker, amiodarone 

## 2024-04-15 NOTE — Progress Notes (Signed)
 Patient arrived to 6N04 A&O X 4 with a 3/10 pain located in RLE. Urinal placed at bedside, bed alarm on, floor mats down, bed in lowest position, and call light within reach.

## 2024-04-15 NOTE — Progress Notes (Signed)
 Patient vomited right after eating. Denies any abdominal pain and vitals stable. Patient stated It was very sudden, I feel fine now It was right after I was done eating. MD made aware, this nurse requested nausea/vomiting PRN medication. Patient was cleaned up and denies any current nausea, all needs met at this time. Bed in lowest position and call light within reach.

## 2024-04-15 NOTE — Assessment & Plan Note (Signed)
 Currently with significant edema but given AKI will hold diuretics for now pending further workup as above. --Strict I/O's and daily weights monitor volume status

## 2024-04-15 NOTE — Assessment & Plan Note (Signed)
 Resume Lipitor and Zetia

## 2024-04-15 NOTE — ED Provider Notes (Signed)
 [place] EMERGENCY DEPARTMENT AT [facility] Provider Note   CSN: [id number] Arrival date & time: 04/15/24  1023     Patient presents with: Urinary Retention   [name] [name] is a 73 y.o. male who arrives via EMS due to patient family concerned that he has had poor urinary output.  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  Has a longstanding history of bilateral knee arthritis and was prior to having a stroke being worked up by cardiology and vascular to get certified for knee replacement surgery.  Patient reports that his legs are more swollen than normal and both of his knees are hurting badly which is contributing to some decreased mobility at the time but EMS reports he was able to get up and walk to the stretcher with assistance and using his walker.  He denies fever chills or history of UTI.  He denies chest pain or shortness of breath.  Echocardiogram performed on 11 2 showed EF 75% with mildly left ventricular hypertrophy and grade 1 diastolic dysfunction  {Add pertinent medical, surgical, social history, OB history to HPI:32947} HPI     Prior to Admission medications  Medication Sig Start Date End Date Taking? Authorizing Provider  acetaminophen  (TYLENOL ) 325 MG tablet Take 1-2 tablets (325-650 mg total) by mouth every 6 (six) hours as needed for mild pain. 03/15/14   Love, Sharlet RAMAN, PA-C  amiodarone  (PACERONE ) 200 MG tablet Take 1 tablet (200 mg total) by mouth daily. 03/19/24   Chandrasekhar, Stanly LABOR, MD  atorvastatin  (LIPITOR) 40 MG tablet Take 1 tablet (40 mg total) by mouth daily. 03/15/24   de Clint Kill,  Cortney E, NP  bisoprolol  (ZEBETA ) 5 MG tablet Take 1 tablet (5 mg total) by mouth daily. 03/19/24   Santo Stanly LABOR, MD  cyclobenzaprine  (FLEXERIL ) 5 MG tablet Take 1 tablet (5 mg total) by mouth 3 (three) times daily as needed for muscle spasms. 03/14/24   de Clint Kill, Cortney E, NP  ezetimibe  (ZETIA ) 10 MG tablet Take 1 tablet (10 mg total) by mouth daily. 06/24/23   Santo Stanly LABOR, MD  HYDROcodone -acetaminophen  (NORCO) 7.5-325 MG tablet Take 1 tablet by mouth every 4 (four) hours as needed for moderate pain (pain score 4-6). 03/14/24   de Clint Kill, Cortney E, NP  oxyCODONE  10 MG TABS Take 1 tablet (10 mg total) by mouth every 4 (four) hours as needed for severe pain (pain score 7-10). 03/14/24   de Clint Kill, Cortney E, NP  polyethylene glycol (MIRALAX  / GLYCOLAX ) 17 g packet Take 17 g by mouth daily. 03/15/24   de Clint Kill, Cortney E, NP  rivaroxaban  (XARELTO ) 10 MG TABS tablet Take 1 tablet (10 mg total) by mouth daily. 03/14/24   de Clint Kill, Cortney E, NP  rivaroxaban  (XARELTO ) 10 MG TABS tablet Take 1 tablet (10 mg total) by mouth every evening. 04/13/24     senna-docusate (SENOKOT-S) 8.6-50 MG tablet Take 1 tablet by mouth at bedtime as needed for mild constipation. 03/14/24   de Clint Kill, Cortney E, NP    Allergies: Adhesive [tape]; Amlodipine ; Amoxicillin; Atenolol ; Celebrex [celecoxib]; Cephalosporins; Clindamycin/lincomycin; Dexon [dexamethasone  sodium  phosphate]; Doxazosin mesylate; Duraprep [antiseptic products, misc.]; Enalapril; Erythromycin; Hctz [hydrochlorothiazide ]; Iodine; Lipitor [atorvastatin ]; Other; Toprol  xl [metoprolol  succinate]; Verapamil; Vioxx [rofecoxib]; Zocor [simvastatin]; and Latex    Review of Systems  Updated Vital Signs BP (!) 179/59 (BP Location: Right Arm)   Pulse 64   Temp 98.7 F (37.1 C) (Oral)   Resp 18   Ht 5' 7 (1.702 m)   Wt 96.6 kg   SpO2 99%   BMI 33.36 kg/m   Physical Exam Vitals and nursing note reviewed.   Constitutional:      General: He is not in acute distress.    Appearance: He is well-developed. He is not diaphoretic.  HENT:     Head: Normocephalic and atraumatic.  Eyes:     General: No scleral icterus.    Conjunctiva/sclera: Conjunctivae normal.  Cardiovascular:     Rate and Rhythm: Normal rate and regular rhythm.     Heart sounds: Murmur heard.  Pulmonary:     Effort: Pulmonary effort is normal. No respiratory distress.     Breath sounds: Normal breath sounds.  Abdominal:     Palpations: Abdomen is soft.     Tenderness: There is no abdominal tenderness.  Musculoskeletal:     Cervical back: Normal range of motion and neck supple.  Skin:    General: Skin is warm and dry.  Neurological:     Mental Status: He is alert.  Psychiatric:        Behavior: Behavior normal.     (all labs ordered are listed, but only abnormal results are displayed) Labs Reviewed - No data to display  EKG: None  Radiology: No results found.  {Document cardiac monitor, telemetry assessment procedure when appropriate:32947} Procedures   Medications Ordered in the ED - No data to display  Clinical Course as of 04/18/24 2240  Sun Apr 15, 2024  1217 Patient case discussed with Dr. Burnard Cunning who will placed bed orders for the patient. [AH]    Clinical Course User Index [AH] Arloa Chroman, PA-C   {Click here for ABCD2, HEART and other calculators REFRESH Note before signing:1}                              Medical Decision Making This patient presents to the ED for concern of ***, this involves an extensive number of treatment options, and is a complaint that carries with it a high risk of complications and morbidity.  ***  Co morbidities:      ***  Social Determinants of Health:       ***  Additional history:  {Additional history obtained from ***} {External records from outside source obtained and reviewed including}  Lab Tests:  I Ordered, and personally interpreted  labs.  The pertinent results include:  ***   Imaging Studies:  I ordered imaging studies including *** I independently visualized and interpreted imaging which showed *** I agree with the radiologist interpretation  Cardiac Monitoring/ECG:       The patient was maintained on a cardiac monitor.  I personally viewed and interpreted the cardiac monitored which showed an underlying rhythm of: ***  Medicines ordered and prescription drug management:  I ordered medication including Medications acetaminophen  (TYLENOL ) tablet 650 mg (has no administration in time range)   Or acetaminophen  (TYLENOL ) suppository 650 mg (has no administration in time range) 0.9 %  sodium chloride  infusion ( Intravenous Stopped 04/16/24 1831) polyethylene glycol (MIRALAX  / GLYCOLAX ) packet  17 g (has no administration in time range) bisacodyl  (DULCOLAX) EC tablet 5 mg (has no administration in time range) hydrALAZINE  (APRESOLINE ) injection 10 mg (10 mg Intravenous Given 04/18/24 0903) rivaroxaban  (XARELTO ) tablet 10 mg (10 mg Oral Given 04/18/24 0901) ezetimibe  (ZETIA ) tablet 10 mg (10 mg Oral Given 04/18/24 0901) atorvastatin  (LIPITOR) tablet 40 mg (40 mg Oral Given 04/18/24 0901) artificial tears ophthalmic solution 1 drop (1 drop Both Eyes Given 04/15/24 1754) oxyCODONE  (Oxy IR/ROXICODONE ) immediate release tablet 10 mg (10 mg Oral Given 04/16/24 0522) ondansetron  (ZOFRAN ) injection 4 mg (has no administration in time range) 0.9 %  sodium chloride  infusion ( Intravenous Stopped 04/17/24 0253) losartan  (COZAAR ) tablet 25 mg (25 mg Oral Given 04/18/24 1510) amiodarone  (PACERONE ) tablet 100 mg (has no administration in time range) nebivolol  (BYSTOLIC ) tablet 20 mg (has no administration in time range) isosorbide  dinitrate (ISORDIL ) tablet 30 mg (has no administration in time range) hydrALAZINE  (APRESOLINE ) tablet 50 mg (has no administration in time range) pneumococcal 20-valent conjugate vaccine (PREVNAR 20 )  injection 0.5 mL (0.5 mLs Intramuscular Given 04/16/24 0858) hydrALAZINE  (APRESOLINE ) injection 10 mg (10 mg Intravenous Given 04/17/24 0757)  for *** Reevaluation of the patient after these medicines showed that the patient {resolved/improved/worsened:23923::improved} I have reviewed the patients home medicines and have made adjustments as needed  Test Considered:       ***  Critical Interventions:       ***  Consultations Obtained: ***  Problem List / ED Course:       (N17.9) AKI (acute kidney injury)  (primary encounter diagnosis)  (R60.0) Peripheral edema   MDM: ***   Dispostion:  After consideration of the diagnostic results and the patients response to treatment, I feel that the patent would benefit from ***.    Amount and/or Complexity of Data Reviewed Labs: ordered. Radiology: ordered.  Risk Decision regarding hospitalization.     {Document critical care time when appropriate  Document review of labs and clinical decision tools ie CHADS2VASC2, etc  Document your independent review of radiology images and any outside records  Document your discussion with family members, caretakers and with consultants  Document social determinants of health affecting pt's care  Document your decision making why or why not admission, treatments were needed:32947:::1}   Final diagnoses:  None    ED Discharge Orders     None

## 2024-04-15 NOTE — Assessment & Plan Note (Signed)
 Med history pending but does not appear to be on PPI or H2 blocker

## 2024-04-15 NOTE — H&P (Addendum)
 Telemedicine History and Physical    Patient: [name] [name] FMW:[id number] DOB: 1950/09/29 DOA: 04/15/2024 DOS: the patient was seen and examined on 04/15/2024 PCP: Loring Tanda Mae, MD   Referring Provider: Lavanda Lesches, PA Telemedicine Provider: Burnard Cunning, DO Patient Location: [facility] Referring Diagnosis: AKI Patient Name and DOB verified: [name] [name], 09-Sep-1950 Patient consented to Telemedicine Evaluation: yes RN virtual assistant: Edsel Kell, RN Video encounter time and date: 04/15/24 12:30 PM   Patient coming from: Home  Chief Complaint:  Chief Complaint  Patient presents with   Urinary Retention   HPI: [name] [name] is a 73 y.o. male with medical history significant of hypertension, aortic stenosis, and chronic HFpEF (Echo in April EF 60-65%, G1DD, mod LVH, mild MR, mod AS), history of stroke 03/03/24 s/p TNK attributed to new onset A-fib/flutter in November (had outpatient Neurology follow up recently 12/11) now on Xarelto , right femur fracture sustained in a fall when he suffered the stroke s/p ORIF, presented to [facility] ED for evaluation of low urine output.  Patient denies history of enlarged prostate.  He reports ongoing improvement since his stroke, leg fracture and multiple rib fractures, says healing every day.  He denies residual weakness since his stroke.  He denies recent NSAID use, stating he takes Aleve for arthritis occasionally but not much at all recently.  No recent issues with or appetite or reduced p.o. intake, but he does report drinking mostly Pepsi, not water, which has been the case for many years.  Feels he could possibly be dehydrated reports only making about 1 to 1-1/2 ounces of urine all night last night.  Denies dysuria, hematuria, abdominal or flank pain, fevers or chills.  He denies other recent illnesses including cough, congestion or sore throat, headaches or dizziness or other recent symptoms  ED course --   Initial  vitals: Temp 98.7 F, HR 64, RR 18, BP 179/59 initially, later 130/55, SpO2 99% on room air. Labs obtained included CMP and CBC were notable for creatinine 2.67 up from 1.19 on 03/14/2024, bicarb 21, BUN 57, alk phos 129, hemoglobin 8.5 improved from postop anemia he had in November after his femur fracture. UA was negative for signs of infection, also no hemoglobin or RBCs, did show hyaline casts and mucus. Imaging-  --Complete 4 view right knee x-ray showed diffuse edema, no osseous abnormalities, severe tricompartmental osteoarthritis. -- Venous Doppler right lower extremity negative for DVT.  EDP reported checking post-void residual which showed 225 cc.  Overall in the ED, patient had put out a little over 200 cc.  Patient is being admitted to the hospital for further evaluation and management of acute kidney injury and oliguria as outlined in detail below.   Review of Systems: As mentioned in the history of present illness. All other systems reviewed and are negative.  Past Medical History:  Diagnosis Date   Alcohol  abuse    Anemia    Arthritis    CHF (congestive heart failure) (HCC)    Cirrhosis of liver (HCC)    Cirrhosis, alcoholic (HCC)    last etoh 3/13-sees dr abran   Colon polyps    Esophageal varices (HCC)    GERD (gastroesophageal reflux disease)    H/O sleep apnea    Hyperlipidemia    Hypertension    Sleep apnea    had surgery to correct snoring 2001   Vocal cord paralysis    left   Past Surgical History:  Procedure Laterality Date   APPENDECTOMY  COLONOSCOPY  2013   HERNIA REPAIR     INSERTION OF MESH  04/21/2012   Procedure: INSERTION OF MESH;  Surgeon: Vicenta DELENA Poli, MD;  Location: [facility];  Service: General;  Laterality: N/A;   INTRAMEDULLARY (IM) NAIL INTERTROCHANTERIC Right 03/06/2024   Procedure: FIXATION, FRACTURE, INTERTROCHANTERIC, WITH INTRAMEDULLARY ROD;  Surgeon: Georgina Ozell DELENA, MD;  Location: [facility] OR;  Service:  Orthopedics;  Laterality: Right;   MOUTH SURGERY  7/13   ORIF ANKLE FRACTURE Left 02/12/2014   Procedure: OPEN REDUCTION INTERNAL FIXATION (ORIF) LEFT ANKLE FRACTURE ;  Surgeon: Norleen Armor, MD;  Location: [facility] OR;  Service: Orthopedics;  Laterality: Left;   PERCUTANEOUS PINNING Left 02/12/2014   Procedure: Closed Reduction  PERCUTANEOUS PINNING left great toe;  Surgeon: Norleen Armor, MD;  Location: [facility];  Service: Orthopedics;  Laterality: Left;   ROTATOR CUFF REPAIR Bilateral    SHOULDER OPEN ROTATOR CUFF REPAIR     2 on right shoulder, 1 on left shoulder   THROAT SURGERY     surgery to help with snoring   UMBILICAL HERNIA REPAIR  04/21/2012   Procedure: HERNIA REPAIR UMBILICAL ADULT;  Surgeon: Vicenta DELENA Poli, MD;  Location: [facility];  Service: General;  Laterality: N/A;  umbilical hernia repair with mesh   UMBILICAL HERNIA REPAIR     UPPER GI ENDOSCOPY  2013   UVULOPALATOPHARYNGOPLASTY  2001   UVULOPALATOPHARYNGOPLASTY     Social History:  reports that he has never smoked. He has never used smokeless tobacco. He reports current drug use. Drug: Marijuana. He reports that he does not drink alcohol .  Allergies[1]  Family History  Problem Relation Age of Onset   Diabetes Mother    Congestive Heart Failure Mother    Heart disease Father        first MI at age 67, stents, CABG   Diabetes Father    Atrial fibrillation Brother    CAD Brother        MI and stent age 60   Hypertension Brother    Colon cancer Neg Hx    Stomach cancer Neg Hx    Rectal cancer Neg Hx     Prior to Admission medications  Medication Sig Start Date End Date Taking? Authorizing Provider  acetaminophen  (TYLENOL ) 325 MG tablet Take 1-2 tablets (325-650 mg total) by mouth every 6 (six) hours as needed for mild pain. 03/15/14   Love, Sharlet RAMAN, PA-C  amiodarone  (PACERONE ) 200 MG tablet Take 1 tablet (200 mg total) by mouth daily. 03/19/24   Chandrasekhar, Stanly DELENA, MD  atorvastatin  (LIPITOR)  40 MG tablet Take 1 tablet (40 mg total) by mouth daily. 03/15/24   de Clint Kill, Cortney E, NP  bisoprolol  (ZEBETA ) 5 MG tablet Take 1 tablet (5 mg total) by mouth daily. 03/19/24   Chandrasekhar, Stanly DELENA, MD  cyclobenzaprine  (FLEXERIL ) 5 MG tablet Take 1 tablet (5 mg total) by mouth 3 (three) times daily as needed for muscle spasms. 03/14/24   de Clint Kill, Cortney E, NP  ezetimibe  (ZETIA ) 10 MG tablet Take 1 tablet (10 mg total) by mouth daily. 06/24/23   Santo Stanly DELENA, MD  HYDROcodone -acetaminophen  (NORCO) 7.5-325 MG tablet Take 1 tablet by mouth every 4 (four) hours as needed for moderate pain (pain score 4-6). 03/14/24   de Clint Kill, Cortney E, NP  oxyCODONE  10 MG TABS Take 1 tablet (10 mg total) by mouth every 4 (four) hours as needed for severe pain (pain score  7-10). 03/14/24   de Clint Kill, Cortney E, NP  polyethylene glycol (MIRALAX  / GLYCOLAX ) 17 g packet Take 17 g by mouth daily. 03/15/24   de Clint Kill, Cortney E, NP  rivaroxaban  (XARELTO ) 10 MG TABS tablet Take 1 tablet (10 mg total) by mouth daily. 03/14/24   de Clint Kill, Cortney E, NP  rivaroxaban  (XARELTO ) 10 MG TABS tablet Take 1 tablet (10 mg total) by mouth every evening. 04/13/24     senna-docusate (SENOKOT-S) 8.6-50 MG tablet Take 1 tablet by mouth at bedtime as needed for mild constipation. 03/14/24   de Clint Kill Earle FORBES, NP    Physical Exam: Vitals:   04/15/24 1029 04/15/24 1040 04/15/24 1200 04/15/24 1435  BP: (!) 179/59  (!) 130/55 (!) 134/50  Pulse: 64  (!) 53 (!) 56  Resp: 18  18 20   Temp: 98.7 F (37.1 C)   97.8 F (36.6 C)  TempSrc: Oral   Oral  SpO2: 99%  98% 100%  Weight:  96.6 kg    Height:  5' 7 (1.702 m)     Bedside physical exam was performed by RN listed above. Below exam findings are based on their in person physical exam findings and my observations during virtual encounter.  General exam: awake, alert, no acute distress HEENT: moist mucus membranes, hearing grossly normal  Respiratory  system: CTAB, no wheezes, rales or rhonchi, normal respiratory effort. Cardiovascular system: normal S1/S2, RRR, 2+ bilateral lower extremity edema Gastrointestinal system: soft, NT, ND, +bowel sounds. Central nervous system: A&O x 3. no gross focal neurologic deficits, normal speech Extremities: RN reported 2+ bilateral lower extremity edema symmetric bilaterally Skin: RN reported bilateral legs warm but equal bilaterally Psychiatry: normal mood, congruent affect, judgement and insight appear normal   Data Reviewed:  Labs and diagnostic studies as reviewed in detail above  Assessment and Plan:  * AKI (acute kidney injury) Etiology to be determined.  Patient denies recent excessive NSAID use.  Appears last IV contrast exposure was back 03/04/2024.  No prior diagnosis of BPH per patient.  P.o. intake sounds like it has been adequate but patient states he feels he could possibly be dehydrated. Does have significant edema with hx of cirrhosis and HFpEF, so cardio- vs hepato-renal are in differential. --Check renal ultrasound for signs of obstruction -- IV fluid challenge and assess response --Avoid nephrotoxins and hypotension --Renally dose meds --Consider nephrology consult if not improving or etiology remains unclear after above  PAF (paroxysmal atrial fibrillation) (HCC) Rate controlled on admission. --Resume Xarelto , beta-blocker, amiodarone   Bilateral lower extremity edema Bedside assessment by ED RN reported bilateral lower extremity edema +2 pitting but symmetric.  An RLE Doppler ultrasound was negative for DVT.  This could be secondary to oliguria and renal failure.  versus decompensated HFpEF.  Also history of cirrhosis but albumin  is normal 3.7 on labs today. -- Due to AKI, hold any diuresis for now  History of stroke In early November.  Attributed to new onset A-fib.  Now on Xarelto . --Resume Xarelto , Lipitor, Zetia   Cirrhosis (HCC) Currently with significant lower  extremity edema but given AKI will need to hold diuretics for now --Monitor volume status closely -I/O's and daily weights  Heart failure with improved ejection fraction (HFimpEF) (HCC) Currently with significant edema but given AKI will hold diuretics for now pending further workup as above. --Strict I/O's and daily weights monitor volume status   Normocytic anemia Hemoglobin 8.5 on admission is improved from a low of 7.1 postoperatively  in November after his femur fracture.  No recent evidence of bleeding reported. --Monitor CBC  Pure hypercholesterolemia Resume Lipitor and Zetia   GERD (gastroesophageal reflux disease) Med history pending but does not appear to be on PPI or H2 blocker       Advance Care Planning: CODE STATUS-full code Patient indicated that he would want to do anything possible to live and would want all resuscitation efforts in the event of cardiac or respiratory arrest.  Consults: None at this time  Family Communication: Brother present during virtual admission encounter  Severity of Illness: The appropriate patient status for this patient is INPATIENT. Inpatient status is judged to be reasonable and necessary in order to provide the required intensity of service to ensure the patient's safety. The patient's presenting symptoms, physical exam findings, and initial radiographic and laboratory data in the context of their chronic comorbidities is felt to place them at high risk for further clinical deterioration. Furthermore, it is not anticipated that the patient will be medically stable for discharge from the hospital within 2 midnights of admission.   * I certify that at the point of admission it is my clinical judgment that the patient will require inpatient hospital care spanning beyond 2 midnights from the point of admission due to high intensity of service, high risk for further deterioration and high frequency of surveillance required.*  Author: Burnard DELENA Cunning, DO 04/15/2024 2:38 PM  For on call review [email or web address].      [1]  Allergies Allergen Reactions   Adhesive [Tape] Other (See Comments)    REACTION: blisters.  Use paper tape only   Amlodipine  Other (See Comments)    REACTION:  unknown   Amoxicillin Hives    Did it involve swelling of the face/tongue/throat, SOB, or low BP? No Did it involve sudden or severe rash/hives, skin peeling, or any reaction on the inside of your mouth or nose? No Did you need to seek medical attention at a hospital or doctor's office? No When did it last happen? childhood       If all above answers are NO, may proceed with cephalosporin use.    Atenolol  Other (See Comments)    REACTION:  unknown   Celebrex [Celecoxib] Nausea And Vomiting    Also Vioxx   Cephalosporins Hives   Clindamycin/Lincomycin Hives   Dexon [Dexamethasone  Sodium Phosphate ] Other (See Comments)    REACTION:  unknown   Doxazosin Mesylate Other (See Comments)    REACTION: unknown   Duraprep [Antiseptic Products, Misc.]    Enalapril Other (See Comments)    REACTION: dry cough   Erythromycin Hives   Hctz [Hydrochlorothiazide ] Other (See Comments)    REACTION: severe sensitivity to light, burning sensation   Iodine     AGENT: Duraprep REACTION:  blisters   Lipitor [Atorvastatin ]    Other     AGENT:Vicryl, cephalnxon REACTION:  body rejects it   Toprol  Xl [Metoprolol  Succinate]    Verapamil Other (See Comments)    REACTION:  Dry cough   Vioxx [Rofecoxib] Hives   Zocor [Simvastatin] Nausea And Vomiting   Latex Rash

## 2024-04-15 NOTE — Progress Notes (Signed)
° °  Brief Progress Note   _____________________________________________________________________________________________________________  Patient Name: [name] [name] Patient DOB: 1950-06-13 Date: @TODAY @      Data: Reviewed vital signs, labs, and clinical notes.    Action: No action required at this time.    Response:  Bed assigned.  _____________________________________________________________________________________________________________  [facility] RN Expeditor Tyannah Sane S Tzirel Leonor Please contact us  directly via secure chat (search for [facility]) or by calling us  at [phone] [facility]).

## 2024-04-15 NOTE — ED Notes (Signed)
 [name] [name] with cl called for transport

## 2024-04-15 NOTE — Assessment & Plan Note (Signed)
 In early November.  Attributed to new onset A-fib.  Now on Xarelto . --Resume Xarelto , Lipitor, Zetia 

## 2024-04-15 NOTE — Assessment & Plan Note (Signed)
 Currently with significant lower extremity edema but given AKI will need to hold diuretics for now --Monitor volume status closely -I/O's and daily weights

## 2024-04-15 NOTE — Assessment & Plan Note (Addendum)
 Etiology to be determined.  Patient denies recent excessive NSAID use.  Appears last IV contrast exposure was back 03/04/2024.  No prior diagnosis of BPH per patient.  P.o. intake sounds like it has been adequate but patient states he feels he could possibly be dehydrated. Does have significant edema with hx of cirrhosis and HFpEF, so cardio- vs hepato-renal are in differential. --Check renal ultrasound for signs of obstruction -- IV fluid challenge and assess response --Avoid nephrotoxins and hypotension --Renally dose meds --Consider nephrology consult if not improving or etiology remains unclear after above

## 2024-04-15 NOTE — ED Triage Notes (Signed)
 BIB [facility] from home.  Reports low urine output since yesterday. Per EMS, patient's family is more concerned than the patient. Patient denies any complaints.    Stroke on 03/03/24.

## 2024-04-16 ENCOUNTER — Inpatient Hospital Stay (HOSPITAL_COMMUNITY)

## 2024-04-16 DIAGNOSIS — R609 Edema, unspecified: Secondary | ICD-10-CM

## 2024-04-16 LAB — CBC
HCT: 24.8 % — ABNORMAL LOW (ref 39.0–52.0)
Hemoglobin: 7.7 g/dL — ABNORMAL LOW (ref 13.0–17.0)
MCH: 30.3 pg (ref 26.0–34.0)
MCHC: 31 g/dL (ref 30.0–36.0)
MCV: 97.6 fL (ref 80.0–100.0)
Platelets: 198 K/uL (ref 150–400)
RBC: 2.54 MIL/uL — ABNORMAL LOW (ref 4.22–5.81)
RDW: 14.9 % (ref 11.5–15.5)
WBC: 6.7 K/uL (ref 4.0–10.5)
nRBC: 0 % (ref 0.0–0.2)

## 2024-04-16 LAB — BASIC METABOLIC PANEL WITH GFR
Anion gap: 5 (ref 5–15)
BUN: 47 mg/dL — ABNORMAL HIGH (ref 8–23)
CO2: 23 mmol/L (ref 22–32)
Calcium: 8 mg/dL — ABNORMAL LOW (ref 8.9–10.3)
Chloride: 111 mmol/L (ref 98–111)
Creatinine, Ser: 1.91 mg/dL — ABNORMAL HIGH (ref 0.61–1.24)
GFR, Estimated: 36 mL/min — ABNORMAL LOW (ref >60)
Glucose, Bld: 89 mg/dL (ref 70–99)
Potassium: 4.6 mmol/L (ref 3.5–5.1)
Sodium: 139 mmol/L (ref 135–145)

## 2024-04-16 MED ORDER — SODIUM CHLORIDE 0.9 % IV SOLN: INTRAVENOUS | Status: AC

## 2024-04-16 MED ORDER — HYDRALAZINE HCL 25 MG PO TABS
25.0000 mg | ORAL_TABLET | Freq: Three times a day (TID) | ORAL | Status: DC
  Administered 2024-04-16 – 2024-04-17 (×3): 25 mg via ORAL
  Filled 2024-04-16 (×3): qty 1

## 2024-04-16 NOTE — Progress Notes (Signed)
 Progress Note   Patient: [name] [name] FMW:[id number] DOB: July 07, 1950 DOA: 04/15/2024     1 DOS: the patient was seen and examined on 04/16/2024   Brief hospital course: 73 y.o. male with medical history significant of hypertension, aortic stenosis, and chronic HFpEF (Echo in April EF 60-65%, G1DD, mod LVH, mild MR, mod AS), history of stroke 03/03/24 s/p TNK attributed to new onset A-fib/flutter in November (had outpatient Neurology follow up recently 12/11) now on Xarelto , right femur fracture sustained in a fall when he suffered the stroke s/p ORIF, presented to [facility] ED for evaluation of low urine output.  Patient denies history of enlarged prostate.  He reports ongoing improvement since his stroke, leg fracture and multiple rib fractures, says healing every day.  He denies residual weakness since his stroke.  He denies recent NSAID use, stating he takes Aleve for arthritis occasionally but not much at all recently.  No recent issues with or appetite or reduced p.o. intake, but he does report drinking mostly Pepsi, not water, which has been the case for many years.  Feels he could possibly be dehydrated reports only making about 1 to 1-1/2 ounces of urine all night last night.  Denies dysuria, hematuria, abdominal or flank pain, fevers or chills.  He denies other recent illnesses including cough, congestion or sore throat, headaches or dizziness or other recent symptoms   Assessment and Plan: AKI (acute kidney injury) - renal ultrasound reviewed, no evidence of obstruction or hydronephrosis -- IV fluid challenge and assess response --pt admits to likely poor PO intake - Cr improving with IVF hydration -recheck bmet in AM   PAF (paroxysmal atrial fibrillation) (HCC) Rate controlled on admission. --Continued Xarelto , beta-blocker, amiodarone    Bilateral lower extremity edema -diuretics currently on hold for now given ARF above   History of stroke In early November.  Attributed to new  onset A-fib.  Now on Xarelto . --continue Lipitor, Zetia  -Per most recent Neurology note on 12/11, recommendation to continue xarelto  10mg  daily for DVT prophylaxis for 30-day course post-hip surgery, then have pt f/u with Cardiology within one month to decide on anticoag moving forward   Cirrhosis (HCC) -given AKI will need to hold diuretics for now --Monitor volume status closely -I/O's and daily weights   Heart failure with improved ejection fraction (HFimpEF) (HCC) -continuing to hold off on diuretic for now -Renal function improving, not yet at baseline    Normocytic anemia Hemoglobin 8.5 on admission is improved from a low of 7.1 postoperatively in November after his femur fracture.  No recent evidence of bleeding reported. --Monitor CBC   Pure hypercholesterolemia Continue Lipitor and Zetia    GERD (gastroesophageal reflux disease)   Subjective: Reports feeling somewhat better this AM  Physical Exam: Vitals:   04/16/24 0517 04/16/24 0904 04/16/24 0956 04/16/24 1457  BP: (!) 167/54  (!) 180/58 (!) 172/52  Pulse: (!) 51  (!) 51   Resp:   17   Temp: 98.1 F (36.7 C)  98 F (36.7 C)   TempSrc: Oral  Oral   SpO2: 95%  96%   Weight:  91.7 kg    Height:  5' 7 (1.702 m)     General exam: Awake, laying in bed, in nad Respiratory system: Normal respiratory effort, no wheezing Cardiovascular system: regular rate, s1, s2 Gastrointestinal system: Soft, nondistended, positive BS Central nervous system: CN2-12 grossly intact, strength intact Extremities: Perfused, no clubbing Skin: Normal skin turgor, no notable skin lesions seen Psychiatry: Mood normal // no  visual hallucinations   Data Reviewed:  Labs reviewed: Na 139, K 4.6, Cr 1.91, WBC 6.7, Hgb 7.7  Family Communication: Pt in room, family not at bedside  Disposition: Status is: Inpatient Remains inpatient appropriate because: Severity of illness  Planned Discharge Destination: Home with Home  Health    Author: Garnette Pelt, MD 04/16/2024 5:20 PM  For on call review [email or web address].

## 2024-04-16 NOTE — Progress Notes (Signed)
 Assisted pt from [facility] back to chair in room. Pt had medium BM. Pt placed in chair. Bed alarm on. Call light in reach. All needs met at this time

## 2024-04-16 NOTE — Hospital Course (Signed)
 73 y.o. male with medical history significant of hypertension, aortic stenosis, and chronic HFpEF (Echo in April EF 60-65%, G1DD, mod LVH, mild MR, mod AS), history of stroke 03/03/24 s/p TNK attributed to new onset A-fib/flutter in November (had outpatient Neurology follow up recently 12/11) now on Xarelto , right femur fracture sustained in a fall when he suffered the stroke s/p ORIF, presented to [facility] ED for evaluation of low urine output.  Patient denies history of enlarged prostate.  He reports ongoing improvement since his stroke, leg fracture and multiple rib fractures, says healing every day.  He denies residual weakness since his stroke.  He denies recent NSAID use, stating he takes Aleve for arthritis occasionally but not much at all recently.  No recent issues with or appetite or reduced p.o. intake, but he does report drinking mostly Pepsi, not water, which has been the case for many years.  Feels he could possibly be dehydrated reports only making about 1 to 1-1/2 ounces of urine all night last night.  Denies dysuria, hematuria, abdominal or flank pain, fevers or chills.  He denies other recent illnesses including cough, congestion or sore throat, headaches or dizziness or other recent symptoms

## 2024-04-16 NOTE — Evaluation (Signed)
 Physical Therapy Evaluation Patient Details Name: [name] [name] MRN: [id number] DOB: [birth year] Today's Date: 04/16/2024  History of Present Illness  Pt is 73 yo presenting to [facility] On 12/14 due to urinary retention.  PMH:  alcohol  abuse, CHF, cirrhosis of the liver, esophageal varices, GERD, OSA, HTN, HLD, sleep apnea, and vocal cord paralysis on the left  Clinical Impression  Pt is currently presenting Mod I for bed mobility, CGA for sit to stand with RW and for 30 ft of gait for safety. Pt fatigues quickly. Expected to progress well. Pt was just at rehab and about to start HHPT. Due to pt current functional status, home set up and available assistance at home recommending skilled physical therapy services 3x/week in order to address strength, balance and functional mobility to decrease risk for falls, injury and re-hospitalization.          If plan is discharge home, recommend the following: Assist for transportation;Assistance with cooking/housework     Equipment Recommendations None recommended by PT     Functional Status Assessment Patient has had a recent decline in their functional status and demonstrates the ability to make significant improvements in function in a reasonable and predictable amount of time.     Precautions / Restrictions Precautions Precautions: Fall Recall of Precautions/Restrictions: Intact Restrictions Weight Bearing Restrictions Per Provider Order: No Other Position/Activity Restrictions: WBAT per MD surgical note Dominique Ada MD on 11/4      Mobility  Bed Mobility Overal bed mobility: Modified Independent       General bed mobility comments: Used rail with HOB silghtly elevated. Slight increase in time.    Transfers Overall transfer level: Needs assistance Equipment used: Rolling walker (2 wheels) Transfers: Sit to/from Stand Sit to Stand: Contact guard assist           General transfer comment: CGA for safety, Slow to rise due to pain in  bil knees.    Ambulation/Gait Ambulation/Gait assistance: Contact guard assist Gait Distance (Feet): 30 Feet Assistive device: Rolling walker (2 wheels) Gait Pattern/deviations: Step-through pattern, Decreased step length - left, Decreased stance time - right, Trunk flexed Gait velocity: decreased Gait velocity interpretation: <1.8 ft/sec, indicate of risk for recurrent falls   General Gait Details: very short partial step through gait pattern with flat foot initial contact and flexed posture    Balance Overall balance assessment: Needs assistance Sitting-balance support: Single extremity supported, Feet supported Sitting balance-Leahy Scale: Good     Standing balance support: During functional activity, Reliant on assistive device for balance, Bilateral upper extremity supported Standing balance-Leahy Scale: Poor Standing balance comment: CGA for safety, improved with distance           Pertinent Vitals/Pain Pain Assessment Pain Assessment: 0-10 Pain Score: 6  Pain Location: bil knees Pain Descriptors / Indicators: Aching Pain Intervention(s): Monitored during session, Limited activity within patient's tolerance    Home Living Family/patient expects to be discharged to:: Private residence Living Arrangements: Alone Available Help at Discharge: Available 24 hours/day;Friend(s) (has a friend who can help 24/7) Type of Home: House Home Access: Level entry       Home Layout: One level Home Equipment: Agricultural Consultant (2 wheels);Wheelchair - manual;Cane - single point;BSC/3in1      Prior Function Prior Level of Function : Independent/Modified Independent;Driving             Mobility Comments: Mod I , was transferring to w/c ADLs Comments: Mod I     Extremity/Trunk Assessment   Upper Extremity  Assessment Upper Extremity Assessment: Overall WFL for tasks assessed    Lower Extremity Assessment Lower Extremity Assessment: Generalized weakness    Cervical /  Trunk Assessment Cervical / Trunk Assessment: Normal  Communication   Communication Communication: Impaired Factors Affecting Communication: Hearing impaired    Cognition Arousal: Alert Behavior During Therapy: WFL for tasks assessed/performed   PT - Cognitive impairments: No apparent impairments     Following commands: Intact       Cueing Cueing Techniques: Verbal cues     General Comments General comments (skin integrity, edema, etc.): no signs/symptoms of cardiac/respirtory distress        Assessment/Plan    PT Assessment Patient needs continued PT services  PT Problem List Decreased strength;Decreased range of motion;Decreased activity tolerance;Decreased balance;Decreased mobility;Pain       PT Treatment Interventions DME instruction;Balance training;Gait training;Functional mobility training;Patient/family education;Therapeutic activities;Therapeutic exercise    PT Goals (Current goals can be found in the Care Plan section)  Acute Rehab PT Goals Patient Stated Goal: to go home PT Goal Formulation: With patient Time For Goal Achievement: 04/30/24 Potential to Achieve Goals: Good    Frequency Min 2X/week        AM-PAC PT 6 Clicks Mobility  Outcome Measure Help needed turning from your back to your side while in a flat bed without using bedrails?: A Little Help needed moving from lying on your back to sitting on the side of a flat bed without using bedrails?: A Little Help needed moving to and from a bed to a chair (including a wheelchair)?: A Little Help needed standing up from a chair using your arms (e.g., wheelchair or bedside chair)?: A Little Help needed to walk in hospital room?: A Little Help needed climbing 3-5 steps with a railing? : A Lot 6 Click Score: 17    End of Session Equipment Utilized During Treatment: Gait belt Activity Tolerance: Patient tolerated treatment well Patient left: in chair;with call bell/phone within reach;with chair  alarm set Nurse Communication: Mobility status PT Visit Diagnosis: Unsteadiness on feet (R26.81);Other abnormalities of gait and mobility (R26.89);Pain;Muscle weakness (generalized) (M62.81) Pain - Right/Left:  (bil) Pain - part of body: Knee    Time: 8784-8764 PT Time Calculation (min) (ACUTE ONLY): 20 min   Charges:   PT Evaluation $PT Eval Low Complexity: 1 Low   PT General Charges $$ ACUTE PT VISIT: 1 Visit        Dorothyann Maier, DPT, CLT  Acute Rehabilitation Services Office: [phone] (Secure chat preferred)   Dorothyann VEAR Maier 04/16/2024, 1:30 PM

## 2024-04-16 NOTE — Progress Notes (Signed)
°   04/16/24 0904  Height and Weight  Height 5' 7 (1.702 m)  Weight 91.7 kg  BSA (Calculated - sq m) 2.08 sq meters  BMI (Calculated) 31.66  Weight in (lb) to have BMI = 25 159.3

## 2024-04-16 NOTE — Plan of Care (Signed)
°  Problem: Education: Goal: Knowledge of General Education information will improve Description: Including pain rating scale, medication(s)/side effects and non-pharmacologic comfort measures Outcome: Progressing   Problem: Activity: Goal: Risk for activity intolerance will decrease Outcome: Progressing   Problem: Nutrition: Goal: Adequate nutrition will be maintained Outcome: Progressing   Problem: Elimination: Goal: Will not experience complications related to bowel motility Outcome: Progressing Goal: Will not experience complications related to urinary retention Outcome: Progressing   Problem: Safety: Goal: Ability to remain free from injury will improve Outcome: Progressing   Problem: Pain Managment: Goal: General experience of comfort will improve and/or be controlled Outcome: Progressing

## 2024-04-16 NOTE — Evaluation (Signed)
 Occupational Therapy Evaluation Patient Details Name: [name] [name] MRN: [id number] DOB: 1950-06-23 Today's Date: 04/16/2024   History of Present Illness   Pt is 73 yo presenting to [facility] On 12/14 due to urinary retention.  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 Pt currently requires minA with ADL and functional mobility for ADL @ RW level due to below listed deficits. Pt states his friend can assist as needed at DC. Recommend HHOT. Acute OT to follow to facilitate safe DC home.      If plan is discharge home, recommend the following:   A little help with bathing/dressing/bathroom;Assistance with cooking/housework;Assist for transportation     Functional Status Assessment   Patient has had a recent decline in their functional status and demonstrates the ability to make significant improvements in function in a reasonable and predictable amount of time.     Equipment Recommendations   None recommended by OT     Recommendations for Other Services         Precautions/Restrictions   Precautions Precautions: Fall Recall of Precautions/Restrictions: Intact Restrictions Weight Bearing Restrictions Per Provider Order: No Other Position/Activity Restrictions: WBAT per MD surgical note Dominique Ada MD on 11/4     Mobility Bed Mobility Overal bed mobility: Modified Independent             General bed mobility comments: Used rail with HOB silghtly elevated. Slight increase in time.    Transfers Overall transfer level: Needs assistance Equipment used: Rolling walker (2 wheels) Transfers: Sit to/from Stand Sit to Stand: Contact guard assist            General transfer comment: CGA for safety, Slow to rise due to pain in bil knees.      Balance Overall balance assessment: Needs assistance Sitting-balance support: Single extremity supported, Feet supported Sitting balance-Leahy Scale: Good     Standing balance support: During functional activity, Reliant on assistive device for balance, Bilateral upper extremity supported Standing balance-Leahy Scale: Poor Standing balance comment: CGA for safety, improved with distance                           ADL either performed or assessed with clinical judgement   ADL Overall ADL's : Needs assistance/impaired Eating/Feeding: Independent   Grooming: Set up;Sitting   Upper Body Bathing: Set up;Sitting   Lower Body Bathing: Minimal assistance;Sit to/from stand   Upper Body Dressing : Set up;Sitting   Lower Body Dressing: Minimal assistance;Sit to/from stand   Toilet Transfer: Contact guard assist;Ambulation   Toileting- Clothing Manipulation and Hygiene: Supervision/safety       Functional mobility during ADLs: Contact guard assist;Rolling walker (2 wheels);Cueing for safety       Vision Baseline Vision/History: 1 Wears glasses ( I need new glasses)       Perception         Praxis         Pertinent Vitals/Pain Pain Assessment Pain Assessment: Faces Faces Pain Scale: Hurts little more Pain Location: bil knees Pain Descriptors / Indicators: Aching Pain Intervention(s): Limited activity within patient's tolerance     Extremity/Trunk Assessment Upper  Extremity Assessment Upper Extremity Assessment: Overall WFL for tasks assessed (hx of R RTC insufficiency)   Lower Extremity Assessment Lower Extremity Assessment: Defer to PT evaluation   Cervical / Trunk Assessment Cervical / Trunk Assessment: Normal   Communication Communication Communication: Impaired Factors Affecting Communication: Hearing impaired   Cognition Arousal: Alert Behavior  During Therapy: WFL for tasks assessed/performed Cognition: No family/caregiver present to determine baseline (most likely baseline)                               Following commands: Intact       Cueing  General Comments   Cueing Techniques: Verbal cues      Exercises     Shoulder Instructions      Home Living Family/patient expects to be discharged to:: Private residence Living Arrangements: Alone Available Help at Discharge: Available 24 hours/day;Friend(s) (has a friend who can help 24/7) Type of Home: House Home Access: Level entry     Home Layout: One level     Bathroom Shower/Tub: Walk-in shower;Tub/shower unit   Bathroom Toilet: Standard     Home Equipment: Agricultural Consultant (2 wheels);Wheelchair - manual;Cane - single point;BSC/3in1          Prior Functioning/Environment Prior Level of Function : Independent/Modified Independent;Driving             Mobility Comments: Mod I , was transferring to w/c ADLs Comments: Mod I; states LB dressing is difficult, therfore he does not wear underwear or socks and only wears slip on shoes    OT Problem List: Decreased strength;Decreased activity tolerance;Impaired balance (sitting and/or standing);Decreased safety awareness;Decreased knowledge of use of DME or AE   OT Treatment/Interventions: Self-care/ADL training;Therapeutic exercise;Energy conservation;DME and/or AE instruction;Therapeutic activities;Cognitive remediation/compensation;Patient/family education;Visual/perceptual remediation/compensation;Balance training      OT Goals(Current goals can be found in the care plan section)   Acute Rehab OT Goals Patient Stated Goal: go home OT Goal Formulation: With patient Time For Goal Achievement: 04/30/24 Potential to Achieve Goals: Good   OT Frequency:  Min 2X/week    Co-evaluation              AM-PAC OT 6 Clicks Daily Activity     Outcome Measure Help from another person eating  meals?: None Help from another person taking care of personal grooming?: A Little Help from another person toileting, which includes using toliet, bedpan, or urinal?: A Little Help from another person bathing (including washing, rinsing, drying)?: A Little Help from another person to put on and taking off regular upper body clothing?: A Little Help from another person to put on and taking off regular lower body clothing?: A Little 6 Click Score: 19   End of Session Equipment Utilized During Treatment: Gait belt;Rolling walker (2 wheels) Nurse Communication: Mobility status  Activity Tolerance: Patient tolerated treatment well Patient left: in chair;with call bell/phone within reach;with chair alarm set  OT Visit Diagnosis: Unsteadiness on feet (R26.81);Other abnormalities of gait and mobility (R26.89);Muscle weakness (generalized) (M62.81);History of falling (Z91.81)                Time: 8364-8344 OT Time Calculation (min): 20 min Charges:  OT General Charges $OT Visit: 1 Visit OT Evaluation $OT Eval Low Complexity: 1 Low  Kreg Sink, OT/L   Acute OT Clinical Specialist Acute Rehabilitation Services Pager [phone] Office [phone]   [facility] 04/16/2024, 6:58 PM

## 2024-04-16 NOTE — TOC Initial Note (Signed)
 Transition of Care (TOC) - Initial/Assessment Note   Spoke to patient at bedside. Patient from home alone. Recently was at [facility] for rehab, discharged home with home health through [facility].   Patient would like to continue home health services with [facility] . NCM spoke with Artavia with [facility], he is active with them for [facility] and HHPT, she will need new orders and face to face. NCM entered and secure chatted MD.   Patient has walker, bedside commode and wheelchair at home.   Patient states his brother will provide him transportation home at discharge.  Patient Details  Name: [name] [name] MRN: [id number] Date of Birth: Jul 30, 1950  Transition of Care [facility]) CM/SW Contact:    Stephane Powell Jansky, RN Phone Number: 04/16/2024, 2:30 PM  Clinical Narrative:                   Expected Discharge Plan: Home w Home Health Services Barriers to Discharge: Continued Medical Work up   Patient Goals and CMS Choice Patient states their goals for this hospitalization and ongoing recovery are:: to return to home CMS Medicare.gov Compare Post Acute Care list provided to:: Patient Choice offered to / list presented to : Patient      Expected Discharge Plan and Services   Discharge Planning Services: CM Consult Post Acute Care Choice: Home Health Living arrangements for the past 2 months: Single Family Home                   DME Agency: NA       HH Arranged: RN, PT HH Agency: [facility] Date HH Agency Contacted: 04/16/24 Time HH Agency Contacted: 1429 Representative spoke with at [facility] Agency: [facility]  Prior Living Arrangements/Services Living arrangements for the past 2 months: Single Family Home Lives with:: Self Patient language and need for interpreter reviewed:: Yes Do you feel safe going back to the place where you live?: Yes      Need for Family Participation in Patient Care: Yes (Comment) Care giver support system in place?: Yes (comment) Current  home services: DME Criminal Activity/Legal Involvement Pertinent to Current Situation/Hospitalization: No - Comment as needed  Activities of Daily Living   ADL Screening (condition at time of admission) Independently performs ADLs?: Yes (appropriate for developmental age) Is the patient deaf or have difficulty hearing?: Yes Does the patient have difficulty seeing, even when wearing glasses/contacts?: No Does the patient have difficulty concentrating, remembering, or making decisions?: No  Permission Sought/Granted   Permission granted to share information with : Yes, Verbal Permission Granted     Permission granted to share info w AGENCY: [facility]        Emotional Assessment Appearance:: Appears stated age Attitude/Demeanor/Rapport: Engaged Affect (typically observed): Appropriate Orientation: : Oriented to Self, Oriented to Place, Oriented to  Time, Oriented to Situation Alcohol  / Substance Use: Not Applicable Psych Involvement: No (comment)  Admission diagnosis:  Peripheral edema [R60.0] AKI (acute kidney injury) [N17.9] Patient Active Problem List   Diagnosis Date Noted   AKI (acute kidney injury) 04/15/2024   Bilateral lower extremity edema 04/15/2024   PAF (paroxysmal atrial fibrillation) (HCC) 04/15/2024   Atrial arrhythmia 03/08/2024   Displaced intertrochanteric fracture of right femur, initial encounter for closed fracture (HCC) 03/04/2024   Moderate aortic valve stenosis 03/04/2024   History of stroke 03/03/2024   Nonrheumatic aortic (valve) stenosis 09/21/2023   Pure hypercholesterolemia 09/21/2023   Hypertensive heart disease 08/02/2022   Pre-op evaluation 08/02/2022   Heart  failure, type unknown (HCC) 08/02/2022   LVH (left ventricular hypertrophy) 08/02/2022   Systolic murmur 08/02/2022   GERD (gastroesophageal reflux disease) 06/30/2018   Hypertensive urgency 06/30/2018   Trimalleolar fracture of left ankle 03/08/2014   Dysphagia, oropharyngeal phase  03/08/2014   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) 03/05/2014   Acute delirium 02/24/2014   Blunt chest trauma 02/09/2014   Fracture of lumbar spine (HCC) 02/09/2014   Multiple rib fractures involving four or more ribs 02/09/2014   Traumatic pneumothorax 02/09/2014   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   History of osteopenia 10/13/2012   Compression fracture of L1 lumbar vertebra (HCC) 10/13/2012   Alcohol  abuse 10/13/2012   Esophageal varices (HCC) 01/17/2012   History of colonic polyps 10/29/2011   Cirrhosis (HCC) 10/29/2011   Nonspecific abnormal finding in stool contents 07/26/2011   Lower extremity weakness 07/21/2011   Normocytic anemia 07/21/2011   Abnormal LFTs 07/21/2011   Heart failure with improved ejection fraction (HFimpEF) (HCC) 07/21/2011   Moderate protein-calorie malnutrition 07/21/2011   PCP:  Loring Tanda Mae, MD Pharmacy:   [facility] [phone] [place], [place] - [address] [place] [address] Phone: [phone] Fax: [phone]  [facility] [address] [place] [address] Phone: [phone] Fax: [phone]     Social Drivers of Health (SDOH) Social History: SDOH Screenings   Food Insecurity: No Food Insecurity (04/15/2024)  Housing: Unknown (04/15/2024)  Transportation Needs: No Transportation Needs (04/15/2024)  Utilities: Not At Risk (04/15/2024)  Social Connections: Patient Declined (04/15/2024)  Tobacco Use: Low Risk (04/15/2024)   SDOH Interventions:     Readmission Risk Interventions     No data to display

## 2024-04-17 LAB — COMPREHENSIVE METABOLIC PANEL WITH GFR
ALT: 14 U/L (ref 0–44)
AST: 15 U/L (ref 15–41)
Albumin: 2.9 g/dL — ABNORMAL LOW (ref 3.5–5.0)
Alkaline Phosphatase: 91 U/L (ref 38–126)
Anion gap: 7 (ref 5–15)
BUN: 33 mg/dL — ABNORMAL HIGH (ref 8–23)
CO2: 20 mmol/L — ABNORMAL LOW (ref 22–32)
Calcium: 8.5 mg/dL — ABNORMAL LOW (ref 8.9–10.3)
Chloride: 110 mmol/L (ref 98–111)
Creatinine, Ser: 1.21 mg/dL (ref 0.61–1.24)
GFR, Estimated: >60 mL/min (ref >60)
Glucose, Bld: 93 mg/dL (ref 70–99)
Potassium: 4.4 mmol/L (ref 3.5–5.1)
Sodium: 137 mmol/L (ref 135–145)
Total Bilirubin: 1.1 mg/dL (ref 0.0–1.2)
Total Protein: 6.1 g/dL — ABNORMAL LOW (ref 6.5–8.1)

## 2024-04-17 LAB — CBC
HCT: 27.6 % — ABNORMAL LOW (ref 39.0–52.0)
Hemoglobin: 8.6 g/dL — ABNORMAL LOW (ref 13.0–17.0)
MCH: 29.8 pg (ref 26.0–34.0)
MCHC: 31.2 g/dL (ref 30.0–36.0)
MCV: 95.5 fL (ref 80.0–100.0)
Platelets: 220 K/uL (ref 150–400)
RBC: 2.89 MIL/uL — ABNORMAL LOW (ref 4.22–5.81)
RDW: 14.6 % (ref 11.5–15.5)
WBC: 7.2 K/uL (ref 4.0–10.5)
nRBC: 0 % (ref 0.0–0.2)

## 2024-04-17 LAB — TROPONIN T, HIGH SENSITIVITY
Troponin T High Sensitivity: 52 ng/L — ABNORMAL HIGH (ref 0–19)
Troponin T High Sensitivity: 51 ng/L — ABNORMAL HIGH (ref 0–19)

## 2024-04-17 MED ORDER — ISOSORBIDE MONONITRATE ER 30 MG PO TB24: 15.0000 mg | ORAL_TABLET | Freq: Every day | ORAL | Status: DC

## 2024-04-17 MED ORDER — HYDRALAZINE HCL 50 MG PO TABS
50.0000 mg | ORAL_TABLET | Freq: Three times a day (TID) | ORAL | Status: DC
  Administered 2024-04-17: 13:00:00 50 mg via ORAL
  Filled 2024-04-17: qty 1

## 2024-04-17 MED ORDER — ISOSORBIDE MONONITRATE ER 30 MG PO TB24
30.0000 mg | ORAL_TABLET | Freq: Every day | ORAL | Status: DC
  Administered 2024-04-18: 09:00:00 30 mg via ORAL
  Filled 2024-04-17: qty 1

## 2024-04-17 MED ORDER — HYDRALAZINE HCL 20 MG/ML IJ SOLN
10.0000 mg | Freq: Once | INTRAMUSCULAR | Status: AC
  Administered 2024-04-17: 08:00:00 10 mg via INTRAVENOUS
  Filled 2024-04-17: qty 1

## 2024-04-17 MED ORDER — ISOSORBIDE MONONITRATE ER 30 MG PO TB24
30.0000 mg | ORAL_TABLET | Freq: Every day | ORAL | Status: DC
  Administered 2024-04-17: 09:00:00 30 mg via ORAL
  Filled 2024-04-17: qty 1

## 2024-04-17 MED ORDER — HYDRALAZINE HCL 50 MG PO TABS
50.0000 mg | ORAL_TABLET | Freq: Three times a day (TID) | ORAL | Status: DC
  Administered 2024-04-17 – 2024-04-18 (×4): 50 mg via ORAL
  Filled 2024-04-17 (×4): qty 1

## 2024-04-17 NOTE — Progress Notes (Signed)
°   04/17/24 0844  Vitals  Temp 97.8 F (36.6 C)  Temp Source Oral  BP (!) 166/61  MAP (mmHg) 91  BP Location Right Arm  BP Method Automatic  Pulse Rate 80  Pulse Rate Source Monitor  Resp 20  Level of Consciousness  Level of Consciousness Alert  MEWS COLOR  MEWS Score Color Green  Oxygen Therapy  SpO2 94 %  O2 Device Room Air  Pain Assessment  Pain Scale 0-10  Pain Score 0  MEWS Score  MEWS Temp 0  MEWS Systolic 0  MEWS Pulse 0  MEWS RR 0  MEWS LOC 0  MEWS Score 0

## 2024-04-17 NOTE — Progress Notes (Signed)
 Mobility Specialist Progress Note:    04/17/24 1138  Mobility  Activity Ambulated with assistance (In room)  Level of Assistance Contact guard assist, steadying assist  Assistive Device Front wheel walker  Distance Ambulated (ft) 30 ft  Activity Response Tolerated well  Mobility Referral Yes  Mobility visit 1 Mobility  Mobility Specialist Start Time (ACUTE ONLY) 1117  Mobility Specialist Stop Time (ACUTE ONLY) 1132  Mobility Specialist Time Calculation (min) (ACUTE ONLY) 15 min   Received pt in bed and agreeable to mobility. Pt required MinG for safety. Pt c/o BLE pain, otherwise tolerated well. Left pt in chair with alarm on. Personal belongings and call light within reach. All needs met.  Lavanda Pollack Mobility Specialist  Please contact via [employer] or  Rehab Office [phone]

## 2024-04-17 NOTE — Progress Notes (Signed)
 Resuming hydralazine  per Chiu,MD

## 2024-04-17 NOTE — Progress Notes (Signed)
 Progress Note   Patient: [name] [name] FMW:[id number] DOB: [birth year] DOA: 04/15/2024     2 DOS: the patient was seen and examined on 04/17/2024   Brief hospital course: 73 y.o. male with medical history significant of hypertension, aortic stenosis, and chronic HFpEF (Echo in April EF 60-65%, G1DD, mod LVH, mild MR, mod AS), history of stroke 03/03/24 s/p TNK attributed to new onset A-fib/flutter in November (had outpatient Neurology follow up recently 12/11) now on Xarelto , right femur fracture sustained in a fall when he suffered the stroke s/p ORIF, presented to [facility] ED for evaluation of low urine output.  Patient denies history of enlarged prostate.  He reports ongoing improvement since his stroke, leg fracture and multiple rib fractures, says healing every day.  He denies residual weakness since his stroke.  He denies recent NSAID use, stating he takes Aleve for arthritis occasionally but not much at all recently.  No recent issues with or appetite or reduced p.o. intake, but he does report drinking mostly Pepsi, not water, which has been the case for many years.  Feels he could possibly be dehydrated reports only making about 1 to 1-1/2 ounces of urine all night last night.  Denies dysuria, hematuria, abdominal or flank pain, fevers or chills.  He denies other recent illnesses including cough, congestion or sore throat, headaches or dizziness or other recent symptoms   Assessment and Plan: AKI (acute kidney injury) - renal ultrasound reviewed, no evidence of obstruction or hydronephrosis -- IV fluid challenge and assess response --pt admits to likely poor PO intake - Cr improved with IVF hydration -recheck bmet in AM   PAF (paroxysmal atrial fibrillation) (HCC) Rate controlled on admission. --Continued Xarelto , beta-blocker, amiodarone    Bilateral lower extremity edema -stable   History of stroke In early November.  Attributed to new onset A-fib.  Now on Xarelto . --continue  Lipitor, Zetia  -Per most recent Neurology note on 12/11, recommendation to continue xarelto  10mg  daily for DVT prophylaxis for 30-day course post-hip surgery, then have pt f/u with Cardiology within one month to decide on anticoag moving forward -Avoid over-correcting BP   Cirrhosis (HCC) -given AKI will need to hold diuretics for now --Monitor volume status closely -I/O's and daily weights   Heart failure with improved ejection fraction (HFimpEF) (HCC) -continuing to hold off on diuretic for now -Renal function has improved with hydration   Normocytic anemia Hemoglobin 8.5 on admission is improved from a low of 7.1 postoperatively in November after his femur fracture.  No recent evidence of bleeding reported. --Monitor CBC   Pure hypercholesterolemia Continue Lipitor and Zetia    GERD (gastroesophageal reflux disease)  Chest pain -Transient. Trop stable around 50 -EKG nonischemic  Uncontrolled Hypertension -SBP into the 200's noted -Currently on bisoprolol  5mg , hydralazine  50mg  TID -Added imdur  25mg  -SBP improved quickly to 112 this AM, given recent CVA, will need to avoid over-correcting bp   Subjective: Noted some transient chest pain this AM  Physical Exam: Vitals:   04/17/24 1044 04/17/24 1204 04/17/24 1303 04/17/24 1400  BP: (!) 176/61 (!) 160/47 (!) 160/47 (!) 112/91  Pulse: 62 61  61  Resp: 18 17    Temp:      TempSrc:      SpO2: 97% 96%  98%  Weight:      Height:       General exam: Conversant, in no acute distress Respiratory system: normal chest rise, clear, no audible wheezing Cardiovascular system: regular rhythm, s1-s2 Gastrointestinal system: Nondistended, nontender, pos  BS Central nervous system: No seizures, no tremors Extremities: No cyanosis, no joint deformities Skin: No rashes, no pallor Psychiatry: Affect normal // no auditory hallucinations   Data Reviewed:  Labs reviewed: Na 137, K 4.4, Cr 1.21  Family Communication: Pt in room, family  not at bedside  Disposition: Status is: Inpatient Remains inpatient appropriate because: Severity of illness  Planned Discharge Destination: Home with Home Health    Author: Garnette Pelt, MD 04/17/2024 3:19 PM  For on call review [email or web address].

## 2024-04-17 NOTE — Progress Notes (Signed)
 hydrALAZINE  (APRESOLINE ) injection 10 mg Given Per new order

## 2024-04-17 NOTE — Progress Notes (Signed)
°   04/17/24 1204  Vitals  BP (!) 160/47  BP Location Right Arm  BP Method Automatic  Patient Position (if appropriate) Sitting  Pulse Rate 61  Pulse Rate Source Dinamap  Resp 17  Level of Consciousness  Level of Consciousness Alert  MEWS COLOR  MEWS Score Color Green  Oxygen Therapy  SpO2 96 %  O2 Device Room Air  Pain Assessment  Pain Scale 0-10  Pain Score 0  MEWS Score  MEWS Temp 0  MEWS Systolic 0  MEWS Pulse 0  MEWS RR 0  MEWS LOC 0  MEWS Score 0

## 2024-04-17 NOTE — Progress Notes (Signed)
 Chiu,MD made aware    04/17/24 1400  Vitals  BP (!) 112/91  MAP (mmHg) 99  BP Location Right Arm  BP Method Automatic  Patient Position (if appropriate) Sitting  Pulse Rate 61  Pulse Rate Source Dinamap  Level of Consciousness  Level of Consciousness Alert  MEWS COLOR  MEWS Score Color Green  Oxygen Therapy  SpO2 98 %  O2 Device Room Air  Pain Assessment  Pain Scale 0-10  Pain Score 0  MEWS Score  MEWS Temp 0  MEWS Systolic 0  MEWS Pulse 0  MEWS RR 0  MEWS LOC 0  MEWS Score 0

## 2024-04-17 NOTE — Progress Notes (Signed)
°   04/17/24 1809  Vitals  BP (!) 146/48  BP Location Right Arm  BP Method Automatic  Patient Position (if appropriate) Lying  Pulse Rate 67  Pulse Rate Source Dinamap  Resp 17  Level of Consciousness  Level of Consciousness Alert  MEWS COLOR  MEWS Score Color Green  Oxygen Therapy  SpO2 98 %  O2 Device Room Air  Pain Assessment  Pain Scale 0-10  Pain Score 0  MEWS Score  MEWS Temp 0  MEWS Systolic 0  MEWS Pulse 0  MEWS RR 0  MEWS LOC 0  MEWS Score 0

## 2024-04-17 NOTE — Progress Notes (Signed)
°   04/17/24 0946  Vitals  BP (!) 171/59  BP Location Right Arm  BP Method Automatic  Patient Position (if appropriate) Lying  Pulse Rate 68  Pulse Rate Source Dinamap  Resp 18  Level of Consciousness  Level of Consciousness Alert  MEWS COLOR  MEWS Score Color Green  Oxygen Therapy  SpO2 95 %  O2 Device Room Air  Pain Assessment  Pain Scale 0-10  Pain Score 0  MEWS Score  MEWS Temp 0  MEWS Systolic 0  MEWS Pulse 0  MEWS RR 0  MEWS LOC 0  MEWS Score 0

## 2024-04-17 NOTE — Progress Notes (Signed)
°   04/17/24 1644  Vitals  Temp 98 F (36.7 C)  Temp Source Oral  BP (!) 177/115  MAP (mmHg) 134  BP Location Right Arm  BP Method Automatic  Patient Position (if appropriate) Lying  Pulse Rate 63  Pulse Rate Source Monitor  Resp 18  Level of Consciousness  Level of Consciousness Alert  MEWS COLOR  MEWS Score Color Green  Oxygen Therapy  SpO2 99 %  O2 Device Room Air  Pain Assessment  Pain Scale 0-10  Pain Score 0  MEWS Score  MEWS Temp 0  MEWS Systolic 0  MEWS Pulse 0  MEWS RR 0  MEWS LOC 0  MEWS Score 0

## 2024-04-17 NOTE — Progress Notes (Signed)
 Chiu,MD made aware    04/17/24 0740  Vitals  BP (!) 200/65  BP Location Right Arm  BP Method Automatic  Patient Position (if appropriate) Lying  Pulse Rate 86  Pulse Rate Source Dinamap  Resp 17  Level of Consciousness  Level of Consciousness Alert  MEWS COLOR  MEWS Score Color Green  Oxygen Therapy  SpO2 95 %  O2 Device Room Air  Pain Assessment  Pain Scale 0-10  Pain Score 3  Height and Weight  Height 5' 7 (1.702 m)  Weight 91.4 kg  BSA (Calculated - sq m) 2.08 sq meters  BMI (Calculated) 31.55  Weight in (lb) to have BMI = 25 159.3  MEWS Score  MEWS Temp 0  MEWS Systolic 0  MEWS Pulse 0  MEWS RR 0  MEWS LOC 0  MEWS Score 0

## 2024-04-17 NOTE — Progress Notes (Signed)
°   04/17/24 1044  Vitals  BP (!) 176/61  BP Location Right Arm  BP Method Automatic  Patient Position (if appropriate) Lying  Pulse Rate 62  Pulse Rate Source Dinamap  Resp 18  Level of Consciousness  Level of Consciousness Alert  MEWS COLOR  MEWS Score Color Green  Oxygen Therapy  SpO2 97 %  O2 Device Room Air  Pain Assessment  Pain Scale 0-10  Pain Score 0  MEWS Score  MEWS Temp 0  MEWS Systolic 0  MEWS Pulse 0  MEWS RR 0  MEWS LOC 0  MEWS Score 0

## 2024-04-17 NOTE — Progress Notes (Signed)
 Per Chiu,MD holding BP meds the rest of today

## 2024-04-18 DIAGNOSIS — I480 Paroxysmal atrial fibrillation: Secondary | ICD-10-CM | POA: Diagnosis not present

## 2024-04-18 DIAGNOSIS — I5030 Unspecified diastolic (congestive) heart failure: Secondary | ICD-10-CM | POA: Diagnosis not present

## 2024-04-18 DIAGNOSIS — E785 Hyperlipidemia, unspecified: Secondary | ICD-10-CM

## 2024-04-18 DIAGNOSIS — I1A0 Resistant hypertension: Secondary | ICD-10-CM

## 2024-04-18 DIAGNOSIS — I350 Nonrheumatic aortic (valve) stenosis: Secondary | ICD-10-CM | POA: Diagnosis not present

## 2024-04-18 DIAGNOSIS — R600 Localized edema: Secondary | ICD-10-CM | POA: Diagnosis not present

## 2024-04-18 DIAGNOSIS — Z91148 Patient's other noncompliance with medication regimen for other reason: Secondary | ICD-10-CM

## 2024-04-18 DIAGNOSIS — I5032 Chronic diastolic (congestive) heart failure: Secondary | ICD-10-CM | POA: Diagnosis not present

## 2024-04-18 DIAGNOSIS — N179 Acute kidney failure, unspecified: Secondary | ICD-10-CM | POA: Diagnosis not present

## 2024-04-18 DIAGNOSIS — I10 Essential (primary) hypertension: Secondary | ICD-10-CM

## 2024-04-18 LAB — COMPREHENSIVE METABOLIC PANEL WITH GFR
ALT: 10 U/L (ref 0–44)
AST: 14 U/L — ABNORMAL LOW (ref 15–41)
Albumin: 3.4 g/dL — ABNORMAL LOW (ref 3.5–5.0)
Alkaline Phosphatase: 105 U/L (ref 38–126)
Anion gap: 12 (ref 5–15)
BUN: 24 mg/dL — ABNORMAL HIGH (ref 8–23)
CO2: 18 mmol/L — ABNORMAL LOW (ref 22–32)
Calcium: 8.9 mg/dL (ref 8.9–10.3)
Chloride: 108 mmol/L (ref 98–111)
Creatinine, Ser: 0.9 mg/dL (ref 0.61–1.24)
GFR, Estimated: >60 mL/min (ref >60)
Glucose, Bld: 91 mg/dL (ref 70–99)
Potassium: 4.1 mmol/L (ref 3.5–5.1)
Sodium: 138 mmol/L (ref 135–145)
Total Bilirubin: 0.9 mg/dL (ref 0.0–1.2)
Total Protein: 6.3 g/dL — ABNORMAL LOW (ref 6.5–8.1)

## 2024-04-18 LAB — CBC
HCT: 26.9 % — ABNORMAL LOW (ref 39.0–52.0)
Hemoglobin: 8.4 g/dL — ABNORMAL LOW (ref 13.0–17.0)
MCH: 30.2 pg (ref 26.0–34.0)
MCHC: 31.2 g/dL (ref 30.0–36.0)
MCV: 96.8 fL (ref 80.0–100.0)
Platelets: 249 K/uL (ref 150–400)
RBC: 2.78 MIL/uL — ABNORMAL LOW (ref 4.22–5.81)
RDW: 15 % (ref 11.5–15.5)
WBC: 8.5 K/uL (ref 4.0–10.5)
nRBC: 0 % (ref 0.0–0.2)

## 2024-04-18 MED ORDER — NEBIVOLOL HCL 10 MG PO TABS
20.0000 mg | ORAL_TABLET | Freq: Every day | ORAL | Status: DC
  Filled 2024-04-18: qty 2

## 2024-04-18 MED ORDER — AMIODARONE HCL 200 MG PO TABS
100.0000 mg | ORAL_TABLET | Freq: Every day | ORAL | Status: DC
  Administered 2024-04-19 – 2024-04-20 (×2): 100 mg via ORAL
  Filled 2024-04-18 (×2): qty 1

## 2024-04-18 MED ORDER — NEBIVOLOL HCL 10 MG PO TABS
20.0000 mg | ORAL_TABLET | Freq: Every day | ORAL | Status: DC
  Administered 2024-04-19 – 2024-04-20 (×2): 20 mg via ORAL
  Filled 2024-04-18 (×2): qty 2

## 2024-04-18 MED ORDER — HYDRALAZINE HCL 50 MG PO TABS: 50.0000 mg | ORAL_TABLET | Freq: Two times a day (BID) | ORAL | Status: DC

## 2024-04-18 MED ORDER — HYDRALAZINE HCL 50 MG PO TABS
50.0000 mg | ORAL_TABLET | Freq: Two times a day (BID) | ORAL | Status: DC
  Administered 2024-04-19 – 2024-04-20 (×3): 50 mg via ORAL
  Filled 2024-04-18 (×3): qty 1

## 2024-04-18 MED ORDER — ISOSORBIDE DINITRATE 10 MG PO TABS
30.0000 mg | ORAL_TABLET | Freq: Two times a day (BID) | ORAL | Status: DC
  Administered 2024-04-19 – 2024-04-20 (×3): 30 mg via ORAL
  Filled 2024-04-18 (×3): qty 3

## 2024-04-18 MED ORDER — LOSARTAN POTASSIUM 25 MG PO TABS
25.0000 mg | ORAL_TABLET | Freq: Every day | ORAL | Status: DC
  Administered 2024-04-18 – 2024-04-20 (×3): 25 mg via ORAL
  Filled 2024-04-18 (×3): qty 1

## 2024-04-18 MED ORDER — ISOSORBIDE DINITRATE 10 MG PO TABS: 30.0000 mg | ORAL_TABLET | Freq: Two times a day (BID) | ORAL | Status: DC

## 2024-04-18 NOTE — Progress Notes (Signed)
 Physical Therapy Treatment Patient Details Name: [name] [name] MRN: [id number] DOB: 01/18/1951 Today's Date: 04/18/2024   History of Present Illness Pt is 73 yo presenting to [facility] On 12/14 due to urinary retention.  PMH: recent CVA (small left occipital infarct) resulting in fall, resulting in R hip fx s/p ORIF 11/14, alcohol  abuse, CHF, cirrhosis of the liver, esophageal varices, GERD, OSA, HTN, HLD, sleep apnea, and vocal cord paralysis on the left    PT Comments  Pt received in chair, agreeable to therapy session and with good participation and tolerance for seated and standing exercises and short household distance gait trial with RW support for BLE strengthening. Pt needing up to minA for postural/tactile cues and with dynamic standing tasks and CGA to light minA for reciprocal transfers due to increasing RLE pain. Increased WOB with fatigue, although pt reports no sensation of shortness of breath. Pt continues to benefit from PT services to progress toward functional mobility goals.    If plan is discharge home, recommend the following: Assist for transportation;Assistance with cooking/housework   Can travel by private vehicle        Equipment Recommendations  None recommended by PT    Recommendations for Other Services       Precautions / Restrictions Precautions Precautions: Fall Recall of Precautions/Restrictions: Intact Precaution/Restrictions Comments: decreased insight into endurance Restrictions Weight Bearing Restrictions Per Provider Order: No Other Position/Activity Restrictions: WBAT per MD surgical note Dominique Ada MD on 11/4     Mobility  Bed Mobility Overal bed mobility: Needs Assistance Bed Mobility: Sit to Supine       Sit to supine: Contact guard assist   General bed mobility comments: CGA for BLE, no rail and flat bed per home set-up.    Transfers Overall transfer level: Needs assistance Equipment used: Rolling walker (2 wheels) Transfers: Sit  to/from Stand Sit to Stand: Contact guard assist, Min assist           General transfer comment: from chair wtih CGA, from EOB x5 reps with CGA to light minA for stability with reciprocal transfers, L rail support    Ambulation/Gait Ambulation/Gait assistance: Contact guard assist, Min assist Gait Distance (Feet): 50 Feet Assistive device: Rolling walker (2 wheels) Gait Pattern/deviations: Step-through pattern, Decreased step length - left, Decreased stance time - right, Trunk flexed, Antalgic       General Gait Details: Short partial step through gait pattern with flat foot initial contact and flexed posture, mod verbal and tactile cues for improved trunk extension/forward gaze and activity pacing, pt wtih elevated RR >20 rpm with exertion, but unaware; SpO2 WFL on RA, however HR variable, poor signal on pulse oximeter.   Stairs             Wheelchair Mobility     Tilt Bed    Modified Rankin (Stroke Patients Only)       Balance Overall balance assessment: Needs assistance Sitting-balance support: Single extremity supported, Feet supported Sitting balance-Leahy Scale: Good     Standing balance support: During functional activity, Reliant on assistive device for balance, Bilateral upper extremity supported Standing balance-Leahy Scale: Poor Standing balance comment: CGA for safety with RW, intermittent minA more for postural cues/safety                            Communication Communication Communication: Impaired Factors Affecting Communication: Hearing impaired  Cognition Arousal: Alert Behavior During Therapy: WFL for tasks assessed/performed   PT -  Cognitive impairments: Safety/Judgement, Problem solving                       PT - Cognition Comments: Decreased insight into deficits, pt with elevated RR but reports not feeling shortness of breath. Following commands: Intact      Cueing Cueing Techniques: Verbal cues, Gestural cues   Exercises Other Exercises Other Exercises: seated BLE AROM: hip flexion, LAQ x10 reps ea Other Exercises: STS x 5 reps reciprocal pushing with BUE from EOB/side rail Other Exercises: standing hip flexion x10 reps ea at RW    General Comments General comments (skin integrity, edema, etc.): SpO2 WFL on RA, HR reading 40's-80's bpm, noisy signal on portable pulse ox.      Pertinent Vitals/Pain Pain Assessment Pain Assessment: Faces Faces Pain Scale: Hurts even more Pain Location: bil knees as gait distance increased; goes down when sitting Pain Descriptors / Indicators: Aching, Discomfort, Grimacing, Guarding Pain Intervention(s): Limited activity within patient's tolerance, Monitored during session, Repositioned    Home Living                          Prior Function            PT Goals (current goals can now be found in the care plan section) Acute Rehab PT Goals Patient Stated Goal: to go home PT Goal Formulation: With patient Time For Goal Achievement: 04/30/24 Progress towards PT goals: Progressing toward goals    Frequency    Min 2X/week      PT Plan      Co-evaluation              AM-PAC PT 6 Clicks Mobility   Outcome Measure  Help needed turning from your back to your side while in a flat bed without using bedrails?: A Little Help needed moving from lying on your back to sitting on the side of a flat bed without using bedrails?: A Little Help needed moving to and from a bed to a chair (including a wheelchair)?: A Little Help needed standing up from a chair using your arms (e.g., wheelchair or bedside chair)?: A Little Help needed to walk in hospital room?: A Little Help needed climbing 3-5 steps with a railing? : Total 6 Click Score: 16    End of Session Equipment Utilized During Treatment: Gait belt Activity Tolerance: Patient tolerated treatment well;Other (comment) (DOE 2/4) Patient left: in bed;with call bell/phone within reach;with  bed alarm set;Other (comment) (HOB >30 degrees, PTA refilled his ice water cup.) Nurse Communication: Mobility status PT Visit Diagnosis: Unsteadiness on feet (R26.81);Other abnormalities of gait and mobility (R26.89);Pain;Muscle weakness (generalized) (M62.81) Pain - Right/Left: Right Pain - part of body: Knee     Time: 8295-8284 PT Time Calculation (min) (ACUTE ONLY): 11 min  Charges:    $Therapeutic Exercise: 8-22 mins PT General Charges $$ ACUTE PT VISIT: 1 Visit                     Doron Shake P., PTA Acute Rehabilitation Services Secure Chat Preferred 9a-5:30pm Office: [phone]    Connell HERO [facility] 04/18/2024, 5:51 PM

## 2024-04-18 NOTE — Consult Note (Signed)
 Cardiology Consultation   Patient ID: RAESEAN BARTOLETTI MRN: [id number]; DOB: 1950-10-25  Admit date: 04/15/2024 Date of Consult: 04/18/2024  PCP:  Loring Tanda Mae, MD   [facility] Providers Cardiologist:  Stanly DELENA Leavens, MD        Patient Profile: [name] [name] is a 73 y.o. male with a hx of hypertension, HFimpEF, moderate aortic stenosis, paroxysmal atrial fibrillation, atrial tachycardia, bradycardia, hx of CVA suspect to be arrhythmogenic, anemia, hyperlipidemia, OSA and cirrhosis c/b esophogeal varices who is being seen 04/18/2024 for the evaluation of hypertension at the request of Garnette Pelt MD.  History of Present Illness: Mr. Pella follows with Dr. Leavens, last seen 03/19/24 and at that time he was on bisoprolol , amiodarone , and xarelto . Overall was going well. BP during this visit was 130/80 and HR 41.   Presented to the ED on 12/14 for low UOP.  BP: 179/59 on admission, highest 217/59 ZRH:dpwld rhythm VR 66, LVH Knee XR: severe osteoarthritis ad diffuse subcutaneous edema DVT US  negative US  renal WNL  Pertinent lab work Cr 2.67 -> 0.9 [baseline 1.1] Hgb ~8 Troponin 52 -> 51  AKI suspected to be 2/2 dehydration. He was given IV fluids with Cr improvement.  Patient noted to have uncontrolled hypertension. Started and titrated on hydralazine  and imdur .  Most recent BP: 169/60  On interview, patient is unsure of the medication changes that occurred with his hypertensive meds. He reports living in assisted living prior to admission. Denied decreased po intake.  Denied chest pain, shortness of breath, palpitations. Reports onging peripheral edema, not worse or better since admission. Does not know what his allergies were to medications listed   Past Medical History:  Diagnosis Date   Alcohol  abuse    Anemia    Arthritis    CHF (congestive heart failure) (HCC)    Cirrhosis of liver (HCC)    Cirrhosis, alcoholic (HCC)    last etoh  3/13-sees dr abran   Colon polyps    Esophageal varices (HCC)    GERD (gastroesophageal reflux disease)    H/O sleep apnea    Hyperlipidemia    Hypertension    Sleep apnea    had surgery to correct snoring 2001   Vocal cord paralysis    left    Past Surgical History:  Procedure Laterality Date   APPENDECTOMY     COLONOSCOPY  2013   HERNIA REPAIR     INSERTION OF MESH  04/21/2012   Procedure: INSERTION OF MESH;  Surgeon: Vicenta DELENA Poli, MD;  Location:  SURGERY CENTER;  Service: General;  Laterality: N/A;   INTRAMEDULLARY (IM) NAIL INTERTROCHANTERIC Right 03/06/2024   Procedure: FIXATION, FRACTURE, INTERTROCHANTERIC, WITH INTRAMEDULLARY ROD;  Surgeon: Georgina Ozell DELENA, MD;  Location: [facility] OR;  Service: Orthopedics;  Laterality: Right;   MOUTH SURGERY  7/13   ORIF ANKLE FRACTURE Left 02/12/2014   Procedure: OPEN REDUCTION INTERNAL FIXATION (ORIF) LEFT ANKLE FRACTURE ;  Surgeon: Norleen Armor, MD;  Location: [facility] OR;  Service: Orthopedics;  Laterality: Left;   PERCUTANEOUS PINNING Left 02/12/2014   Procedure: Closed Reduction  PERCUTANEOUS PINNING left great toe;  Surgeon: Norleen Armor, MD;  Location: [facility] OR;  Service: Orthopedics;  Laterality: Left;   ROTATOR CUFF REPAIR Bilateral    SHOULDER OPEN ROTATOR CUFF REPAIR     2 on right shoulder, 1 on left shoulder   THROAT SURGERY     surgery to help with snoring   UMBILICAL HERNIA REPAIR  04/21/2012  Procedure: HERNIA REPAIR UMBILICAL ADULT;  Surgeon: Vicenta DELENA Poli, MD;  Location: [facility];  Service: General;  Laterality: N/A;  umbilical hernia repair with mesh   UMBILICAL HERNIA REPAIR     UPPER GI ENDOSCOPY  2013   UVULOPALATOPHARYNGOPLASTY  2001   UVULOPALATOPHARYNGOPLASTY         Scheduled Meds:  amiodarone   200 mg Oral Daily   atorvastatin   40 mg Oral Daily   bisoprolol   5 mg Oral Daily   ezetimibe   10 mg Oral Daily   hydrALAZINE   50 mg Oral Q8H   isosorbide  mononitrate  30 mg Oral Daily    rivaroxaban   10 mg Oral Daily   Continuous Infusions:  PRN Meds: acetaminophen  **OR** acetaminophen , artificial tears, bisacodyl , hydrALAZINE , ondansetron  (ZOFRAN ) IV, oxyCODONE , polyethylene glycol  Allergies:   Allergies[1]  Social History:   Social History   Socioeconomic History   Marital status: Widowed    Spouse name: Not on file   Number of children: Not on file   Years of education: Not on file   Highest education level: Not on file  Occupational History   Not on file  Tobacco Use   Smoking status: Never   Smokeless tobacco: Never  Vaping Use   Vaping status: Never Used  Substance and Sexual Activity   Alcohol  use: No    Comment: per patient has not drank since 2015   Drug use: Yes    Types: Marijuana    Comment: occasionally per patient    Sexual activity: Not on file  Other Topics Concern   Not on file  Social History Narrative   ** Merged History Encounter **       caffeine - some coke weekly    Social Drivers of Health   Tobacco Use: Low Risk (04/15/2024)   Patient History    Smoking Tobacco Use: Never    Smokeless Tobacco Use: Never    Passive Exposure: Not on file  Financial Resource Strain: Not on file  Food Insecurity: No Food Insecurity (04/15/2024)   Epic    Worried About Programme Researcher, Broadcasting/film/video in the Last Year: Never true    Ran Out of Food in the Last Year: Never true  Transportation Needs: No Transportation Needs (04/15/2024)   Epic    Lack of Transportation (Medical): No    Lack of Transportation (Non-Medical): No  Physical Activity: Not on file  Stress: Not on file  Social Connections: Patient Declined (04/15/2024)   Social Connection and Isolation Panel    Frequency of Communication with Friends and Family: Patient declined    Frequency of Social Gatherings with Friends and Family: Patient declined    Attends Religious Services: Patient declined    Database Administrator or Organizations: Patient declined    Attends Tax Inspector Meetings: Patient declined    Marital Status: Patient declined  Intimate Partner Violence: Not At Risk (04/15/2024)   Epic    Fear of Current or Ex-Partner: No    Emotionally Abused: No    Physically Abused: No    Sexually Abused: No  Depression (PHQ2-9): Not on file  Alcohol  Screen: Not on file  Housing: Unknown (04/15/2024)   Epic    Unable to Pay for Housing in the Last Year: No    Number of Times Moved in the Last Year: 0    Homeless in the Last Year: Patient declined  Utilities: Not At Risk (04/15/2024)   Epic    Threatened  with loss of utilities: No  Health Literacy: Not on file    Family History:   Family History  Problem Relation Age of Onset   Diabetes Mother    Congestive Heart Failure Mother    Heart disease Father        first MI at age 74, stents, CABG   Diabetes Father    Atrial fibrillation Brother    CAD Brother        MI and stent age 49   Hypertension Brother    Colon cancer Neg Hx    Stomach cancer Neg Hx    Rectal cancer Neg Hx      ROS:  Please see the history of present illness.  All other ROS reviewed and negative.     Physical Exam/Data: Vitals:   04/18/24 0535 04/18/24 0900 04/18/24 0903 04/18/24 0942  BP: (!) 171/68 (!) 191/69 (!) 191/69 (!) 169/60  Pulse: 64 83  68  Resp: 18   16  Temp: 98.2 F (36.8 C)   98.2 F (36.8 C)  TempSrc: Oral   Oral  SpO2: 99% 98%  97%  Weight:      Height:        Intake/Output Summary (Last 24 hours) at 04/18/2024 1239 Last data filed at 04/18/2024 1000 Gross per 24 hour  Intake 480 ml  Output 1020 ml  Net -540 ml      04/18/2024    5:00 AM 04/17/2024    7:40 AM 04/17/2024    5:00 AM  Last 3 Weights  Weight (lbs) 201 lb 4.5 oz 201 lb 8 oz 205 lb 11 oz  Weight (kg) 91.3 kg 91.4 kg 93.3 kg     Body mass index is 31.52 kg/m.  General:  Pleasant older gentlemen in no acute distress HEENT: normal Neck: no JVD Vascular:  Distal pulses 2+ bilaterally Cardiac:  normal S1, S2;  RRR; 4/6 systolic murmur with carotid radiation Lungs:  clear to auscultation bilaterally, no wheezing, rhonchi or rales  Abd: soft, nontender, no hepatomegaly  Ext: 2+ pitting edema Skin: warm and dry  Psych:  Normal affect   EKG:  The EKG was personally reviewed and demonstrates:  See HPI Telemetry:  Telemetry was personally reviewed and demonstrates:  Not on telemetry  Relevant CV Studies: Echocardiogram 03/04/24 IMPRESSIONS     1. Left ventricular ejection fraction, by estimation, is >75%. The left  ventricle has hyperdynamic function. The left ventricle has no regional  wall motion abnormalities. There is mild left ventricular hypertrophy.  Left ventricular diastolic parameters  are consistent with Grade I diastolic dysfunction (impaired relaxation).   2. Right ventricular systolic function is normal. The right ventricular  size is normal.   3. The mitral valve is normal in structure. No evidence of mitral valve  regurgitation. No evidence of mitral stenosis. The mean mitral valve  gradient is 3.7 mmHg. Moderate mitral annular calcification.   4. The aortic valve is normal in structure. Aortic valve regurgitation is  not visualized. No aortic stenosis is present.   5. The inferior vena cava is normal in size with greater than 50%  respiratory variability, suggesting right atrial pressure of 3 mmHg.     Laboratory Data: High Sensitivity Troponin:  Recent Labs  Lab 04/17/24 1134 04/17/24 1341  TRNPT 52* 51*      Chemistry Recent Labs  Lab 04/16/24 0521 04/17/24 0553 04/18/24 0352  NA 139 137 138  K 4.6 4.4 4.1  CL 111 110 108  CO2 23 20* 18*  GLUCOSE 89 93 91  BUN 47* 33* 24*  CREATININE 1.91* 1.21 0.90  CALCIUM  8.0* 8.5* 8.9  GFRNONAA 36* >60 >60  ANIONGAP 5 7 12     Recent Labs  Lab 04/15/24 1100 04/17/24 0553 04/18/24 0352  PROT 6.8 6.1* 6.3*  ALBUMIN  3.7 2.9* 3.4*  AST 19 15 14*  ALT 19 14 10   ALKPHOS 129* 91 105  BILITOT 1.0 1.1 0.9    Hematology Recent Labs  Lab 04/16/24 0521 04/17/24 0553 04/18/24 0352  WBC 6.7 7.2 8.5  RBC 2.54* 2.89* 2.78*  HGB 7.7* 8.6* 8.4*  HCT 24.8* 27.6* 26.9*  MCV 97.6 95.5 96.8  MCH 30.3 29.8 30.2  MCHC 31.0 31.2 31.2  RDW 14.9 14.6 15.0  PLT 198 220 249    Radiology/Studies:  VAS US  LOWER EXTREMITY VENOUS (DVT) Result Date: 04/16/2024  Lower Venous DVT Study Patient Name:  ADDAM GOELLER  Date of Exam:   04/16/2024 Medical Rec #: [id number]      Accession #:    [id number] Date of Birth: 11/11/1950      Patient Gender: M Patient Age:   58 years Exam Location:  [facility] Procedure:      VAS US  LOWER EXTREMITY VENOUS (DVT) Referring Phys: BURNARD GRIFFITH --------------------------------------------------------------------------------  Indications: Edema.  Comparison Study: Previous study of the right lower extremity on 12.14.2025 and                   previous bilateral study on 11.9.2025. Performing Technologist: Edilia Elden Appl  Examination Guidelines: A complete evaluation includes B-mode imaging, spectral Doppler, color Doppler, and power Doppler as needed of all accessible portions of each vessel. Bilateral testing is considered an integral part of a complete examination. Limited examinations for reoccurring indications may be performed as noted. The reflux portion of the exam is performed with the patient in reverse Trendelenburg.  +---------+---------------+---------+-----------+----------+--------------+ RIGHT    CompressibilityPhasicitySpontaneityPropertiesThrombus Aging +---------+---------------+---------+-----------+----------+--------------+ CFV      Full           Yes      Yes                                 +---------+---------------+---------+-----------+----------+--------------+ SFJ      Full           Yes      Yes                                 +---------+---------------+---------+-----------+----------+--------------+ FV Prox  Full                                                         +---------+---------------+---------+-----------+----------+--------------+ FV Mid   Full                                                        +---------+---------------+---------+-----------+----------+--------------+ FV DistalFull                                                        +---------+---------------+---------+-----------+----------+--------------+  PFV      Full                                                        +---------+---------------+---------+-----------+----------+--------------+ POP      Full           Yes      Yes                                 +---------+---------------+---------+-----------+----------+--------------+ PTV      Full                                                        +---------+---------------+---------+-----------+----------+--------------+ PERO     Full                                                        +---------+---------------+---------+-----------+----------+--------------+   +---------+---------------+---------+-----------+----------+-------------------+ LEFT     CompressibilityPhasicitySpontaneityPropertiesThrombus Aging      +---------+---------------+---------+-----------+----------+-------------------+ CFV      Full           Yes      Yes                                      +---------+---------------+---------+-----------+----------+-------------------+ SFJ      Full           Yes      Yes                                      +---------+---------------+---------+-----------+----------+-------------------+ FV Prox  Full                                                             +---------+---------------+---------+-----------+----------+-------------------+ FV Mid   Full                                                             +---------+---------------+---------+-----------+----------+-------------------+ FV DistalFull                                                              +---------+---------------+---------+-----------+----------+-------------------+ PFV      Full                                                             +---------+---------------+---------+-----------+----------+-------------------+  POP      Full           Yes      Yes                                      +---------+---------------+---------+-----------+----------+-------------------+ PTV      Full                                                             +---------+---------------+---------+-----------+----------+-------------------+ PERO                                                  Not well visualized +---------+---------------+---------+-----------+----------+-------------------+     Summary: RIGHT: - There is no evidence of deep vein thrombosis in the lower extremity.  - No cystic structure found in the popliteal fossa.  LEFT: - There is no evidence of deep vein thrombosis in the lower extremity. However, portions of this examination were limited- see technologist comments above.  - No cystic structure found in the popliteal fossa.  *See table(s) above for measurements and observations. Electronically signed by Fonda Rim on 04/16/2024 at 5:50:44 PM.    Final    US  RENAL Result Date: 04/15/2024 EXAM: US  Retroperitoneum Complete, Renal. 04/15/2024 11:05:12 PM TECHNIQUE: Real-time ultrasonography of the kidneys was performed. COMPARISON: CT dated 03/04/2024 CLINICAL HISTORY: Acute renal injury is the clinical history. FINDINGS: FINDINGS: RIGHT KIDNEY/URETER: Right kidney measures 10.1 x 4.6 x 5.2 cm. Calculated volume is 127 ml. Normal cortical echogenicity. No hydronephrosis. No definitive calculi are identified. No mass lesion is seen. LEFT KIDNEY/URETER: Left kidney measures 9.8 x 5.9 x 6.2 cm. Calculated volume is 185 ml. Normal cortical echogenicity. No hydronephrosis. No definitive calculi are identified. No  mass lesion is seen. BLADDER: Unremarkable appearance of the bladder. IMPRESSION: 1. No acute findings. Electronically signed by: Oneil Devonshire MD 04/15/2024 11:11 PM EST RP Workstation: [id number]   US  Venous Img Lower Unilateral Right Result Date: 04/15/2024 CLINICAL DATA:  leg swelling r> L EXAM: RIGHT LOWER EXTREMITY VENOUS DOPPLER ULTRASOUND TECHNIQUE: Gray-scale sonography with compression, as well as color and duplex ultrasound, were performed to evaluate the deep venous system(s) from the level of the common femoral vein through the popliteal and proximal calf veins. COMPARISON:  None Available. FINDINGS: VENOUS Normal compressibility of the common femoral, superficial femoral, and popliteal veins, as well as the visualized calf veins. Visualized portions of profunda femoral vein and great saphenous vein unremarkable. No filling defects to suggest DVT on grayscale or color Doppler imaging. Doppler waveforms show normal direction of venous flow, normal respiratory plasticity and response to augmentation. Limited views of the contralateral common femoral vein are unremarkable. OTHER None. Limitations: none IMPRESSION: Negative. Electronically Signed   By: Corean Salter M.D.   On: 04/15/2024 12:21   DG Knee Complete 4 Views Right Result Date: 04/15/2024 CLINICAL DATA:  Right knee swelling and pain. EXAM: RIGHT KNEE - COMPLETE 4 VIEW COMPARISON:  None Available. FINDINGS: No evidence of fracture, dislocation, or joint effusion. Severe tricompartmental osteoarthritis is seen with tibia varus. Femoral intramedullary rod noted.  Generalized osteopenia. Peripheral vascular calcification. Diffuse subcutaneous edema. IMPRESSION: Diffuse subcutaneous edema. No acute osseous abnormality. Severe tricompartmental osteoarthritis. Electronically Signed   By: Norleen DELENA Kil M.D.   On: 04/15/2024 11:47     Assessment and Plan:  Hypertension BP: 169/90 Prior to CVA patient was on amlodipine  and  valsartan -hydrochlorothiazide  for BP control. Patient does have moderate AS. Echo last month did not note in report, though notable murmur on exam.  Renal function has improved. History of sinus bradycardia with higher dose bisoprolol . Multiple drug allergies though patient unable to share reaction.  Spoke with primary team concern over compliance at home. Agree. May benefit from pill packet and reduced pill burden.   Reduce hydralazine  50 mg to BID [plan to consolidate to Bi-Dil] Change imdur  to isosorbide  dinitrate 30 mg BID [as above] Stop bisoprolol  5 mg [was bradycardic on higher dose] Start Bystolic  20 mg Start Cozaar  25 mg, follow renal function closely  HFimpEF On exam, appears volume up.  Patient did receive IV fluids for his AKI which has improved.  Suspect he is volume up from IV fluids, though asymptomatic. With recent AKI would hold on diuresis for now.   Continue medications as above  Elevated Troponin Most likely 2/2 to hypertension. Denied chest pain. ECG without acute ischemic findings.   Paroxysmal atrial fibrillation  Not on telemetry. Sinus rhythm on 12 lead, sounds regular on exam Chad Vas score 6 Suspected to be the etiology of recent stroke  Reduce amiodarone  200 mg to 100mg , need to watch LFTs closely.  Continue BB as above Continue xarelto  10 mg .  Hyperlipidemia  LDL 127  HDL 37 Direct LDL 58  Continue lipitor 40 mg, consider increasing dose though unknown allergy listed in chart Continue zetia  10 mg  Moderate Aortic Stenosis Continue to monitor outpatient.   Risk Assessment/Risk Scores:      CHA2DS2-VASc Score = 6   This indicates a 9.7% annual risk of stroke. The patient's score is based upon: CHF History: 1 HTN History: 1 Diabetes History: 0 Stroke History: 2 Vascular Disease History: 1 Age Score: 1 Gender Score: 0        For questions or updates, please contact [facility] Please consult [email or web address] for contact  info under      Signed, Leontine LOISE Salen, PA-C  04/18/2024 12:39 PM     [1]  Allergies Allergen Reactions   Adhesive [Tape] Dermatitis and Other (See Comments)    Skin blisters after tape removal  OK with using paper tape   Amoxicillin Hives   Calan Sr [Verapamil] Cough   Cardura [Doxazosin Mesylate] Other (See Comments)    Unknown reaction   Celebrex [Celecoxib] Nausea And Vomiting   Cephalosporins Hives   Clindamycin/Lincomycin Hives   Dexon [Dexamethasone  Sodium Phosphate ] Other (See Comments)    Unknown reaction   Duraprep [Antiseptic Products, Misc.] Dermatitis and Other (See Comments)    Localized skin blisters around application site when applied topically   Ery-Tab [Erythromycin] Hives   Hctz [Hydrochlorothiazide ] Other (See Comments)    Photosensitivity  Burning sensation    Iodine Dermatitis and Other (See Comments)    Localized skin blisters around application site when applied topically   Keflex [Cephalexin] Diarrhea and Nausea And Vomiting    body rejects it   Lipitor [Atorvastatin ]    Norvasc  [Amlodipine ] Other (See Comments)    Unknown reaction   Tenormin  [Atenolol ] Other (See Comments)    Unknown reaction   Toprol  Xl [Metoprolol  Succinate] Other (See  Comments)    Unknown reaction   Vasotec [Enalapril] Cough   Vioxx [Rofecoxib] Hives and Nausea And Vomiting   Zocor [Simvastatin] Nausea And Vomiting   Latex Rash

## 2024-04-18 NOTE — Progress Notes (Signed)
 Progress Note   Patient: [name] [name] FMW:[id number] DOB: [birth year] DOA: 04/15/2024     3 DOS: the patient was seen and examined on 04/18/2024   Brief hospital course: 73 y.o. male with medical history significant of hypertension, aortic stenosis, and chronic HFpEF (Echo in April EF 60-65%, G1DD, mod LVH, mild MR, mod AS), history of stroke 03/03/24 s/p TNK attributed to new onset A-fib/flutter in November (had outpatient Neurology follow up recently 12/11) now on Xarelto , right femur fracture sustained in a fall when he suffered the stroke s/p ORIF, presented to [facility] ED for evaluation of low urine output.  Patient denies history of enlarged prostate.  He reports ongoing improvement since his stroke, leg fracture and multiple rib fractures, says healing every day.  He denies residual weakness since his stroke.  He denies recent NSAID use, stating he takes Aleve for arthritis occasionally but not much at all recently.  No recent issues with or appetite or reduced p.o. intake, but he does report drinking mostly Pepsi, not water, which has been the case for many years.  Feels he could possibly be dehydrated reports only making about 1 to 1-1/2 ounces of urine all night last night.  Denies dysuria, hematuria, abdominal or flank pain, fevers or chills.  He denies other recent illnesses including cough, congestion or sore throat, headaches or dizziness or other recent symptoms   Assessment and Plan: AKI (acute kidney injury) - renal ultrasound reviewed, no evidence of obstruction or hydronephrosis -- IV fluid challenge and assess response --pt admits to likely poor PO intake - Cr normalized after IVF hydration -recheck bmet in AM   PAF (paroxysmal atrial fibrillation) (HCC) Rate controlled on admission. --Continued Xarelto , beta-blocker, amiodarone  -amio dose decreased to 100mg  per Cardiology   Bilateral lower extremity edema -stable   History of stroke In early November.  Attributed to  new onset A-fib.  Now on Xarelto . --continue Lipitor, Zetia  -Per most recent Neurology note on 12/11, recommendation to continue xarelto  10mg  daily for DVT prophylaxis for 30-day course post-hip surgery, then have pt f/u with Cardiology within one month to decide on anticoag moving forward -Avoid over-correcting BP   Cirrhosis (HCC) -given AKI will need to hold diuretics for now --Monitor volume status closely -I/O's and daily weights   Heart failure with improved ejection fraction (HFimpEF) (HCC) -continuing to hold off on diuretic for now -Renal function has improved with hydration   Normocytic anemia Hemoglobin 8.5 on admission is improved from a low of 7.1 postoperatively in November after his femur fracture.  No recent evidence of bleeding reported. --Monitor CBC   Pure hypercholesterolemia Continue Lipitor and Zetia    GERD (gastroesophageal reflux disease)  Chest pain -Transient. Trop stable around 50 -EKG nonischemic  Uncontrolled Hypertension -SBP had been well into the 200's -Currently on bisoprolol  5mg , hydralazine  50mg  TID -Added imdur  30mg  -Given labile BP in the setting of recent stroke and anticoag, consulted Cardiology for assistance.  - Recs noted for ultimate plans to transition to Bi-Dil, starting bystolic  20mg  and starting cozaar  25mg , stopping bisoprolol     Subjective: Very eager to go home  Physical Exam: Vitals:   04/18/24 0903 04/18/24 0942 04/18/24 1334 04/18/24 1335  BP: (!) 191/69 (!) 169/60 (!) 158/55 (!) 158/55  Pulse:  68 71   Resp:  16    Temp:  98.2 F (36.8 C)    TempSrc:  Oral    SpO2:  97% 97%   Weight:      Height:  General exam: Conversant, in no acute distress Respiratory system: normal chest rise, clear, no audible wheezing Cardiovascular system: regular rhythm, s1-s2 Gastrointestinal system: Nondistended, nontender, pos BS Central nervous system: No seizures, no tremors Extremities: No cyanosis, no joint  deformities Skin: No rashes, no pallor Psychiatry: Affect normal // mood normal  Data Reviewed:  Labs reviewed: Na 138, K 4.1, Cr 0.9, WBC 8.5, Hgb 8.4, Plts 249  Family Communication: Pt in room, family not at bedside  Disposition: Status is: Inpatient Remains inpatient appropriate because: Severity of illness  Planned Discharge Destination: Home with Home Health    Author: Garnette Pelt, MD 04/18/2024 3:27 PM  For on call review [email or web address].

## 2024-04-18 NOTE — Progress Notes (Signed)
°   04/18/24 0900  Vitals  BP (!) 191/69  BP Location Right Arm  BP Method Automatic  Patient Position (if appropriate) Lying  Pulse Rate 83  Pulse Rate Source Dinamap  Level of Consciousness  Level of Consciousness Alert  MEWS COLOR  MEWS Score Color Green  Oxygen Therapy  SpO2 98 %  O2 Device Room Air  Pain Assessment  Pain Scale 0-10  Pain Score 0  MEWS Score  MEWS Temp 0  MEWS Systolic 0  MEWS Pulse 0  MEWS RR 0  MEWS LOC 0  MEWS Score 0

## 2024-04-18 NOTE — Plan of Care (Signed)

## 2024-04-19 ENCOUNTER — Encounter: Admitting: Orthopedic Surgery

## 2024-04-19 DIAGNOSIS — I10 Essential (primary) hypertension: Secondary | ICD-10-CM | POA: Diagnosis not present

## 2024-04-19 DIAGNOSIS — I5032 Chronic diastolic (congestive) heart failure: Secondary | ICD-10-CM | POA: Diagnosis not present

## 2024-04-19 DIAGNOSIS — R600 Localized edema: Secondary | ICD-10-CM | POA: Diagnosis not present

## 2024-04-19 DIAGNOSIS — N179 Acute kidney failure, unspecified: Secondary | ICD-10-CM | POA: Diagnosis not present

## 2024-04-19 DIAGNOSIS — I480 Paroxysmal atrial fibrillation: Secondary | ICD-10-CM | POA: Diagnosis not present

## 2024-04-19 LAB — COMPREHENSIVE METABOLIC PANEL WITH GFR
ALT: 10 U/L (ref 0–44)
AST: 15 U/L (ref 15–41)
Albumin: 3.4 g/dL — ABNORMAL LOW (ref 3.5–5.0)
Alkaline Phosphatase: 101 U/L (ref 38–126)
Anion gap: 11 (ref 5–15)
BUN: 23 mg/dL (ref 8–23)
CO2: 20 mmol/L — ABNORMAL LOW (ref 22–32)
Calcium: 8.7 mg/dL — ABNORMAL LOW (ref 8.9–10.3)
Chloride: 107 mmol/L (ref 98–111)
Creatinine, Ser: 1.09 mg/dL (ref 0.61–1.24)
GFR, Estimated: >60 mL/min (ref >60)
Glucose, Bld: 102 mg/dL — ABNORMAL HIGH (ref 70–99)
Potassium: 3.7 mmol/L (ref 3.5–5.1)
Sodium: 138 mmol/L (ref 135–145)
Total Bilirubin: 0.7 mg/dL (ref 0.0–1.2)
Total Protein: 6.1 g/dL — ABNORMAL LOW (ref 6.5–8.1)

## 2024-04-19 LAB — CBC
HCT: 28.3 % — ABNORMAL LOW (ref 39.0–52.0)
Hemoglobin: 9 g/dL — ABNORMAL LOW (ref 13.0–17.0)
MCH: 30.6 pg (ref 26.0–34.0)
MCHC: 31.8 g/dL (ref 30.0–36.0)
MCV: 96.3 fL (ref 80.0–100.0)
Platelets: 253 K/uL (ref 150–400)
RBC: 2.94 MIL/uL — ABNORMAL LOW (ref 4.22–5.81)
RDW: 15 % (ref 11.5–15.5)
WBC: 8.3 K/uL (ref 4.0–10.5)
nRBC: 0 % (ref 0.0–0.2)

## 2024-04-19 NOTE — Plan of Care (Signed)
   Problem: Education: Goal: Knowledge of General Education information will improve Description: Including pain rating scale, medication(s)/side effects and non-pharmacologic comfort measures Outcome: Progressing   Problem: Clinical Measurements: Goal: Respiratory complications will improve Outcome: Progressing   Problem: Activity: Goal: Risk for activity intolerance will decrease Outcome: Progressing

## 2024-04-19 NOTE — Progress Notes (Signed)
 Mobility Specialist Progress Note:    04/19/24 0955  Mobility  Activity Ambulated with assistance (In hallway)  Level of Assistance Contact guard assist, steadying assist (+Chair follow)  Assistive Device Front wheel walker  Distance Ambulated (ft) 100 ft  Activity Response Tolerated well  Mobility Referral Yes  Mobility visit 1 Mobility  Mobility Specialist Start Time (ACUTE ONLY) 0945  Mobility Specialist Stop Time (ACUTE ONLY) 0955  Mobility Specialist Time Calculation (min) (ACUTE ONLY) 10 min   Received pt in bed and agreeable to mobility. Pt required MinG and chair follow for safety. Pt c/o BLE pain, otherwise tolerated well. Pt had x1 seated rest break and returned to room via chair. Left pt in chair with personal belongings and call light within reach. All needs met.  Lavanda Pollack Mobility Specialist  Please contact via [employer] or  Rehab Office [phone]'

## 2024-04-19 NOTE — TOC Progression Note (Signed)
 Transition of Care (TOC) - Progression Note   Spoke to patient , brother and sister in law at bedside. Discussed discharge plan.   Explained PT/OT recommending HHPT/OT.  Patient lives alone, family would like someone to stay with him at home 24/7. NCM explained insurance does not cover 24/7 assistance at home. That patient or family would need to private pay. NCM provided names of some private pay caregiver agencies.    NCM explained patient ambulated 100 feet this morning with Mobility Tech , that insurance would be unlikely to cover short term rehab at a SNF. However, NCM secure chatted PT,OT and SW. PTA will see if a PT can see patient today . Patient Details  Name: [name] [name] MRN: [id number] Date of Birth: 01-02-1951  Transition of Care [facility]) CM/SW Contact  Amiir Heckard, Powell Jansky, RN Phone Number: 04/19/2024, 2:43 PM  Clinical Narrative:       Expected Discharge Plan: Home w Home Health Services Barriers to Discharge: Continued Medical Work up               Expected Discharge Plan and Services   Discharge Planning Services: CM Consult Post Acute Care Choice: Home Health Living arrangements for the past 2 months: Single Family Home                   DME Agency: NA       HH Arranged: RN, PT HH Agency: [facility] Date HH Agency Contacted: 04/16/24 Time HH Agency Contacted: 1429 Representative spoke with at [facility] Agency: [facility]   Social Drivers of Health (SDOH) Interventions SDOH Screenings   Food Insecurity: No Food Insecurity (04/15/2024)  Housing: Unknown (04/15/2024)  Transportation Needs: No Transportation Needs (04/15/2024)  Utilities: Not At Risk (04/15/2024)  Social Connections: Patient Declined (04/15/2024)  Tobacco Use: Low Risk (04/15/2024)    Readmission Risk Interventions     No data to display

## 2024-04-19 NOTE — Progress Notes (Signed)
 Occupational Therapy Treatment Patient Details Name: [name] [name] MRN: [id number] DOB: 03-21-1951 Today's Date: 04/19/2024   History of present illness Pt is 73 yo presenting to [facility] On 12/14 due to urinary retention.  PMH: recent CVA (small left occipital infarct) resulting in fall, resulting in R hip fx s/p ORIF 11/14, alcohol  abuse, CHF, cirrhosis of the liver, esophageal varices, GERD, OSA, HTN, HLD, sleep apnea, and vocal cord paralysis on the left   OT comments  Pt making good progress with functional goals. Pt seated in recliner upon arrival. Session focused on STS,  ADL functional mobility using RW and ADL/selfcare standing/sit-stand. Pt CGA with functional mobility and min A/CGA with LB ADLs, grooming/hygiene in standing. OT will continue to follow acutely to maximize level of function and safety      If plan is discharge home, recommend the following:  A little help with bathing/dressing/bathroom;Assistance with cooking/housework;Assist for transportation   Equipment Recommendations  None recommended by OT    Recommendations for Other Services      Precautions / Restrictions Precautions Precautions: Fall Recall of Precautions/Restrictions: Intact Restrictions Weight Bearing Restrictions Per Provider Order: No Other Position/Activity Restrictions: WBAT per MD surgical note Dominique Ada MD on 11/4       Mobility Bed Mobility               General bed mobility comments: pt in chair upon arrival    Transfers Overall transfer level: Needs assistance Equipment used: Rolling walker (2 wheels) Transfers: Sit to/from Stand Sit to Stand: Contact guard assist           General transfer comment: pt walked to bathroom with RW for toilet transfers and to stand at sink foselfcare tasks     Balance Overall balance assessment: Needs assistance Sitting-balance support: Single extremity supported, Feet supported, No upper extremity supported Sitting balance-Leahy  Scale: Good     Standing balance support: During functional activity, Reliant on assistive device for balance, Bilateral upper extremity supported Standing balance-Leahy Scale: Poor                             ADL either performed or assessed with clinical judgement   ADL Overall ADL's : Needs assistance/impaired     Grooming: Wash/dry hands;Wash/dry face;Contact guard assist;Standing       Lower Body Bathing: Minimal assistance;Contact guard assist;Sit to/from stand;Cueing for safety       Lower Body Dressing: Minimal assistance;Contact guard assist;Sit to/from stand;Cueing for safety   Toilet Transfer: Contact guard assist;Ambulation;Regular Toilet;Grab bars   Toileting- Clothing Manipulation and Hygiene: Supervision/safety;Sit to/from stand       Functional mobility during ADLs: Contact guard assist;Rolling walker (2 wheels);Cueing for safety      Extremity/Trunk Assessment Upper Extremity Assessment Upper Extremity Assessment: Generalized weakness   Lower Extremity Assessment Lower Extremity Assessment: Defer to PT evaluation        Vision Baseline Vision/History: 1 Wears glasses Ability to See in Adequate Light: 0 Adequate Patient Visual Report: No change from baseline     Perception     Praxis     Communication Communication Communication: Impaired Factors Affecting Communication: Hearing impaired   Cognition Arousal: Alert Behavior During Therapy: [facility] for tasks assessed/performed                                 Following commands: Intact  Cueing   Cueing Techniques: Verbal cues  Exercises      Shoulder Instructions       General Comments      Pertinent Vitals/ Pain       Pain Assessment Pain Assessment: No/denies pain Pain Score: 0-No pain Pain Intervention(s): Monitored during session  Home Living                                          Prior Functioning/Environment               Frequency  Min 2X/week        Progress Toward Goals  OT Goals(current goals can now be found in the care plan section)  Progress towards OT goals: Progressing toward goals     Plan      Co-evaluation                 AM-PAC OT 6 Clicks Daily Activity     Outcome Measure   Help from another person eating meals?: None Help from another person taking care of personal grooming?: A Little Help from another person toileting, which includes using toliet, bedpan, or urinal?: A Little Help from another person bathing (including washing, rinsing, drying)?: A Little Help from another person to put on and taking off regular upper body clothing?: A Little Help from another person to put on and taking off regular lower body clothing?: A Little 6 Click Score: 19    End of Session Equipment Utilized During Treatment: Gait belt;Rolling walker (2 wheels)  OT Visit Diagnosis: Unsteadiness on feet (R26.81);Other abnormalities of gait and mobility (R26.89);Muscle weakness (generalized) (M62.81);History of falling (Z91.81)   Activity Tolerance Patient tolerated treatment well   Patient Left in chair;with call bell/phone within reach;with chair alarm set   Nurse Communication Mobility status        Time: 8956-8888 OT Time Calculation (min): 28 min  Charges: OT General Charges $OT Visit: 1 Visit OT Treatments $Self Care/Home Management : 8-22 mins $Therapeutic Activity: 8-22 mins    Jacques Karna Loose 04/19/2024, 12:53 PM

## 2024-04-19 NOTE — Progress Notes (Signed)
 Progress Note  Patient Name: [name] [name] Date of Encounter: 04/19/2024 [facility] Cardiologist: Stanly DELENA Leavens, MD   Interval Summary   No specific complaints, tolerating all the medications that was started yesterday.  Vital Signs Vitals:   04/19/24 0449 04/19/24 0541 04/19/24 0636 04/19/24 0900  BP: (!) 191/64 (!) 194/68 (!) 155/58 (!) 145/55  Pulse: (!) 58   88  Resp: 14   18  Temp: 98.1 F (36.7 C)   98.1 F (36.7 C)  TempSrc: Oral     SpO2: 98%   99%  Weight: 91.8 kg     Height:       No intake or output data in the 24 hours ending 04/19/24 1157    04/19/2024    4:49 AM 04/18/2024    5:00 AM 04/17/2024    7:40 AM  Last 3 Weights  Weight (lbs) 202 lb 6.1 oz 201 lb 4.5 oz 201 lb 8 oz  Weight (kg) 91.8 kg 91.3 kg 91.4 kg        Latest Ref Rng & Units 04/19/2024    2:26 AM 04/18/2024    3:52 AM 04/17/2024    5:53 AM  BMP  Glucose 70 - 99 mg/dL 897  91  93   BUN 8 - 23 mg/dL 23  24  33   Creatinine 0.61 - 1.24 mg/dL 8.90  9.09  8.78   Sodium 135 - 145 mmol/L 138  138  137   Potassium 3.5 - 5.1 mmol/L 3.7  4.1  4.4   Chloride 98 - 111 mmol/L 107  108  110   CO2 22 - 32 mmol/L 20  18  20    Calcium  8.9 - 10.3 mg/dL 8.7  8.9  8.5     Telemetry/ECG  Normal sinus rhythm- Personally Reviewed  Physical Exam Neck:     Vascular: Carotid bruit (bilateral) present. No JVD.  Cardiovascular:     Rate and Rhythm: Normal rate and regular rhythm.     Pulses: Intact distal pulses.     Heart sounds: S1 normal and S2 normal. Murmur heard.     Midsystolic murmur is present with a grade of 3/6 at the upper right sternal border and apex radiating to the neck.     No gallop.  Pulmonary:     Effort: Pulmonary effort is normal.     Breath sounds: Normal breath sounds.  Abdominal:     General: Bowel sounds are normal.     Palpations: Abdomen is soft.  Musculoskeletal:     Right lower leg: No edema.     Left lower leg: No edema.     Assessment &  Plan   HTN  - BP control has been an ongoing issue. Initially there was concern about compliance, but patient reportedly has been taking medications as prescribed. BP as high as 171/68 yesterday  - Improved to 145/55 today  - Currently on hydralazine  50 mg BID, isosrbide dinitrate 30 mg BID- planning to transition to BiDil if insurance allows and can be switched to twice daily dosing but for now patient states that he is going to be compliant. - Continue losartan  25 mg daily, renal function has been stable.  Initial presentation with acute renal failure was related to dehydration.  Continue bystolic  20 mg daily  - Patient promises to maintain a list of medication he takes, he is also now making arrangements for someone to come and help him with his meal and manage his home.  Patient essentially was eating out every day.  Counseling was done and he appears to be well-motivated in making changes.  Chronic HFpEF  - Most recent echocardiogram from 03/2024 showed EF >75%, no wall motion abnormalities, grade I DD, normal RV systolic function  - Patient well compensated without clinical evidence of heart failure  - Has 2+ bilateral leg edema which is improving suspect initial fluid resuscitation leading to edema.  Elevated Troponin  - hsTn 52>51  - Suspect demand ischemia from elevated BP. No plans for ischemic evaluation at this time   PAF - Maintaining NSR  - Continue amiodarone  100 mg daily - Continue xarelto  10 mg daily   Moderate AS  - Continue to monitor outpatient.   Please call if questions.  Will sign off.  For questions or updates, please contact [facility] Please consult [email or web address] for contact info under         Signed, [name] FABIENE Louder, PA-C

## 2024-04-19 NOTE — Progress Notes (Signed)
 Progress Note   Patient: [name] [name] DOB: 1950/12/23 DOA: 04/15/2024     4 DOS: the patient was seen and examined on 04/19/2024   Brief hospital course: 73 y.o. male with medical history significant of hypertension, aortic stenosis, and chronic HFpEF (Echo in April EF 60-65%, G1DD, mod LVH, mild MR, mod AS), history of stroke 03/03/24 s/p TNK attributed to new onset A-fib/flutter in November (had outpatient Neurology follow up recently 12/11) now on Xarelto , right femur fracture sustained in a fall when he suffered the stroke s/p ORIF, presented to [facility] ED for evaluation of low urine output.  Patient denies history of enlarged prostate.  He reports ongoing improvement since his stroke, leg fracture and multiple rib fractures, says healing every day.  He denies residual weakness since his stroke.  He denies recent NSAID use, stating he takes Aleve for arthritis occasionally but not much at all recently.  No recent issues with or appetite or reduced p.o. intake, but he does report drinking mostly Pepsi, not water, which has been the case for many years.  Feels he could possibly be dehydrated reports only making about 1 to 1-1/2 ounces of urine all night last night.  Denies dysuria, hematuria, abdominal or flank pain, fevers or chills.  He denies other recent illnesses including cough, congestion or sore throat, headaches or dizziness or other recent symptoms   Assessment and Plan: AKI (acute kidney injury) - renal ultrasound reviewed, no evidence of obstruction or hydronephrosis --pt admits to likely poor PO intake prior to admit - Cr normalized after IVF hydration - cont to follow bmet trends   PAF (paroxysmal atrial fibrillation) (HCC) Rate controlled on admission. --Continued Xarelto  -amiodarone  dose decreased to 100mg  per Cardiology -on bystolic  20mg  - rate controlled   Bilateral lower extremity edema -stable   History of stroke In early November.  Attributed to new  onset A-fib.  Now on Xarelto . --continue Lipitor, Zetia  -Per most recent Neurology note on 12/11, recommendation to continue xarelto  10mg  daily for DVT prophylaxis for 30-day course post-hip surgery, then have pt f/u with Cardiology within one month to decide on anticoag moving forward -Avoid over-correcting BP   Cirrhosis (HCC) -given AKI will need to hold diuretics for now --Monitor volume status closely -I/O's and daily weights   Heart failure with improved ejection fraction (HFimpEF) (HCC) -continuing to hold off on diuretic for now -Renal function has improved with hydration   Normocytic anemia Hemoglobin 8.5 on admission is improved from a low of 7.1 postoperatively in November after his femur fracture.  No recent evidence of bleeding reported. --Monitor CBC   Pure hypercholesterolemia Continue Lipitor and Zetia    GERD (gastroesophageal reflux disease)  Chest pain -Transient. Trop stable around 50 -EKG nonischemic  Uncontrolled Hypertension -SBP had been well into the 200's -Was on bisoprolol  5mg , hydralazine  50mg  TID, and added imdur  30mg  -Appreciate assistance by Cardiology -Medications consolidated to hydralazine  50mg  BID, isordil  30mg  bid, Cozaar  25mg  every day, bystolic  20mg  every day  -BP trends appear better    Subjective: Remains very eager to go home  Physical Exam: Vitals:   04/19/24 0449 04/19/24 0541 04/19/24 0636 04/19/24 0900  BP: (!) 191/64 (!) 194/68 (!) 155/58 (!) 145/55  Pulse: (!) 58   88  Resp: 14   18  Temp: 98.1 F (36.7 C)   98.1 F (36.7 C)  TempSrc: Oral     SpO2: 98%   99%  Weight: 91.8 kg     Height:  General exam: Awake, laying in bed, in nad Respiratory system: Normal respiratory effort, no wheezing Cardiovascular system: regular rate, s1, s2 Gastrointestinal system: Soft, nondistended, positive BS Central nervous system: CN2-12 grossly intact, strength intact Extremities: Perfused, no clubbing Skin: Normal skin turgor,  no notable skin lesions seen Psychiatry: Mood normal // no visual hallucinations   Data Reviewed:  Labs reviewed: Na 138, K 3.7, Cr 1.09, WBC 8.3, hgb 9.0, Plts 253  Family Communication: Pt in room, updated family over phone  Disposition: Status is: Inpatient Remains inpatient appropriate because: Severity of illness  Planned Discharge Destination: Home with Home Health    Author: Garnette Pelt, MD 04/19/2024 3:53 PM  For on call review [email or web address].

## 2024-04-19 NOTE — Plan of Care (Signed)
   Problem: Education: Goal: Knowledge of General Education information will improve Description Including pain rating scale, medication(s)/side effects and non-pharmacologic comfort measures Outcome: Progressing

## 2024-04-20 DIAGNOSIS — R600 Localized edema: Secondary | ICD-10-CM | POA: Diagnosis not present

## 2024-04-20 DIAGNOSIS — N179 Acute kidney failure, unspecified: Secondary | ICD-10-CM | POA: Diagnosis not present

## 2024-04-20 LAB — COMPREHENSIVE METABOLIC PANEL WITH GFR
ALT: 11 U/L (ref 0–44)
AST: 15 U/L (ref 15–41)
Albumin: 3.4 g/dL — ABNORMAL LOW (ref 3.5–5.0)
Alkaline Phosphatase: 99 U/L (ref 38–126)
Anion gap: 11 (ref 5–15)
BUN: 21 mg/dL (ref 8–23)
CO2: 18 mmol/L — ABNORMAL LOW (ref 22–32)
Calcium: 8.4 mg/dL — ABNORMAL LOW (ref 8.9–10.3)
Chloride: 107 mmol/L (ref 98–111)
Creatinine, Ser: 1.03 mg/dL (ref 0.61–1.24)
GFR, Estimated: >60 mL/min
Glucose, Bld: 90 mg/dL (ref 70–99)
Potassium: 3.7 mmol/L (ref 3.5–5.1)
Sodium: 137 mmol/L (ref 135–145)
Total Bilirubin: 0.6 mg/dL (ref 0.0–1.2)
Total Protein: 5.9 g/dL — ABNORMAL LOW (ref 6.5–8.1)

## 2024-04-20 LAB — CBC
HCT: 26.9 % — ABNORMAL LOW (ref 39.0–52.0)
Hemoglobin: 8.4 g/dL — ABNORMAL LOW (ref 13.0–17.0)
MCH: 29.7 pg (ref 26.0–34.0)
MCHC: 31.2 g/dL (ref 30.0–36.0)
MCV: 95.1 fL (ref 80.0–100.0)
Platelets: 293 K/uL (ref 150–400)
RBC: 2.83 MIL/uL — ABNORMAL LOW (ref 4.22–5.81)
RDW: 14.9 % (ref 11.5–15.5)
WBC: 9.4 K/uL (ref 4.0–10.5)
nRBC: 0 % (ref 0.0–0.2)

## 2024-04-20 MED ORDER — HYDRALAZINE HCL 50 MG PO TABS
50.0000 mg | ORAL_TABLET | Freq: Two times a day (BID) | ORAL | 0 refills | Status: DC
  Filled 2024-04-20: qty 60, 30d supply, fill #0

## 2024-04-20 MED ORDER — NEBIVOLOL HCL 10 MG PO TABS
20.0000 mg | ORAL_TABLET | Freq: Every day | ORAL | 0 refills | Status: DC
  Filled 2024-04-20: qty 60, 30d supply, fill #0

## 2024-04-20 MED ORDER — RIVAROXABAN 20 MG PO TABS
20.0000 mg | ORAL_TABLET | Freq: Every day | ORAL | 0 refills | Status: DC
  Filled 2024-04-20: qty 30, 30d supply, fill #0

## 2024-04-20 MED ORDER — LOSARTAN POTASSIUM 25 MG PO TABS
25.0000 mg | ORAL_TABLET | Freq: Every day | ORAL | 0 refills | Status: DC
  Filled 2024-04-20: qty 30, 30d supply, fill #0

## 2024-04-20 MED ORDER — AMIODARONE HCL 200 MG PO TABS
100.0000 mg | ORAL_TABLET | Freq: Every day | ORAL | 0 refills | Status: DC
  Filled 2024-04-20: qty 15, 30d supply, fill #0

## 2024-04-20 MED ORDER — RIVAROXABAN 20 MG PO TABS: 20.0000 mg | ORAL_TABLET | Freq: Every day | ORAL | Status: DC

## 2024-04-20 MED ORDER — ISOSORBIDE DINITRATE 30 MG PO TABS
30.0000 mg | ORAL_TABLET | Freq: Two times a day (BID) | ORAL | 0 refills | Status: DC
  Filled 2024-04-20: qty 60, 30d supply, fill #0

## 2024-04-20 NOTE — Progress Notes (Signed)
 Pharmacy note: Xarelto   73 yo male on xarelto  10mg /d s/p femur fx repair 11/4 and with afib. Recent CVA w/ discharge on 11/12.  He is noted with afib.  -SCr 1.0, CrCl ~ 65 -discussed with Dr. Ladona: plans are to increase Xarelto  for stoke prevention in afib -Also discussed with Dr. Cindy  Plan -Increase xarelto  to 20mg  po daily -Plans for discharge today  Prentice Poisson, PharmD Clinical Pharmacist **Pharmacist phone directory can now be found on [email or web address] ([email or web address]).  Listed under [facility].

## 2024-04-20 NOTE — Progress Notes (Signed)
 Patient has received his discharge papers and is currently waiting for his brother to pick him up.

## 2024-04-20 NOTE — Plan of Care (Signed)
  Problem: Health Behavior/Discharge Planning: Goal: Ability to manage health-related needs will improve Outcome: Progressing   Problem: Clinical Measurements: Goal: Diagnostic test results will improve Outcome: Progressing Goal: Respiratory complications will improve Outcome: Progressing   

## 2024-04-20 NOTE — Discharge Summary (Signed)
 " Physician Discharge Summary   Patient: [name] [name] MRN: [id number] DOB: 1951/02/26  Admit date:     04/15/2024  Discharge date: 04/20/2024  Discharge Physician: Garnette Pelt   PCP: Loring Tanda Mae, MD   Recommendations at discharge:    Follow up with PCP in 1-2 weeks Follow up with Cardiology as scheduled Follow up with Neurology as scheduled  Discharge Diagnoses: Principal Problem:   AKI (acute kidney injury) Active Problems:   Normocytic anemia   Heart failure with improved ejection fraction (HFimpEF) (HCC)   Cirrhosis (HCC)   History of stroke   Bilateral lower extremity edema   PAF (paroxysmal atrial fibrillation) (HCC)   GERD (gastroesophageal reflux disease)   Pure hypercholesterolemia   Resistant hypertension   Medication nonadherence due to intolerance   Paroxysmal atrial fibrillation (HCC)  Resolved Problems:   * No resolved hospital problems. *  Hospital Course: 73 y.o. male with medical history significant of hypertension, aortic stenosis, and chronic HFpEF (Echo in April EF 60-65%, G1DD, mod LVH, mild MR, mod AS), history of stroke 03/03/24 s/p TNK attributed to new onset A-fib/flutter in November (had outpatient Neurology follow up recently 12/11) now on Xarelto , right femur fracture sustained in a fall when he suffered the stroke s/p ORIF, presented to [facility] ED for evaluation of low urine output.  Patient denies history of enlarged prostate.  He reports ongoing improvement since his stroke, leg fracture and multiple rib fractures, says healing every day.  He denies residual weakness since his stroke.  He denies recent NSAID use, stating he takes Aleve for arthritis occasionally but not much at all recently.  No recent issues with or appetite or reduced p.o. intake, but he does report drinking mostly Pepsi, not water, which has been the case for many years.  Feels he could possibly be dehydrated reports only making about 1 to 1-1/2 ounces of urine all night  last night.  Denies dysuria, hematuria, abdominal or flank pain, fevers or chills.  He denies other recent illnesses including cough, congestion or sore throat, headaches or dizziness or other recent symptoms   Assessment and Plan: AKI (acute kidney injury) - renal ultrasound reviewed, no evidence of obstruction or hydronephrosis --pt admits to likely poor PO intake prior to admit - Cr normalized after IVF hydration   PAF (paroxysmal atrial fibrillation) (HCC) Rate controlled on admission. --Continued Xarelto  -amiodarone  dose decreased to 100mg  per Cardiology -on bystolic  20mg  - rate controlled   Bilateral lower extremity edema -stable   History of stroke In early November.  Attributed to new onset A-fib.  Now on Xarelto . --continue Lipitor, Zetia  -Per most recent Neurology note on 12/11, recommendation to continue xarelto  10mg  daily for DVT prophylaxis for 30-day course post-hip surgery, then have pt f/u with Cardiology within one month to decide on anticoag moving forward -Avoid over-correcting BP   Cirrhosis (HCC) -given AKI will need to hold diuretics for now --Monitor volume status closely -I/O's and daily weights   Heart failure with improved ejection fraction (HFimpEF) (HCC) -continuing to hold off on diuretic for now -Renal function has improved with hydration   Normocytic anemia Hemoglobin 8.5 on admission is improved from a low of 7.1 postoperatively in November after his femur fracture.  No recent evidence of bleeding noted   Pure hypercholesterolemia Continue Lipitor and Zetia    GERD (gastroesophageal reflux disease)   Chest pain -Transient. Trop stable around 50 -EKG nonischemic   Uncontrolled Hypertension -SBP had been well into the 200's -Was on  bisoprolol  5mg , hydralazine  50mg  TID, and added imdur  30mg  -Appreciate assistance by Cardiology -Medications consolidated to hydralazine  50mg  BID, isordil  30mg  bid, Cozaar  25mg  every day, bystolic  20mg  every day   -BP trends appear better    Consultants: Cardiology Procedures performed:   Disposition: Home Diet recommendation:  Cardiac diet DISCHARGE MEDICATION: Allergies as of 04/20/2024       Reactions   Adhesive [tape] Dermatitis, Other (See Comments)   Skin blisters after tape removal OK with using paper tape   Amoxicillin Hives   Calan Sr [verapamil] Cough   Cardura [doxazosin Mesylate] Other (See Comments)   Unknown reaction   Celebrex [celecoxib] Nausea And Vomiting   Cephalosporins Hives   Clindamycin/lincomycin Hives   Dexon [dexamethasone  Sodium Phosphate ] Other (See Comments)   Unknown reaction   Duraprep [antiseptic Products, Misc.] Dermatitis, Other (See Comments)   Localized skin blisters around application site when applied topically   Ery-tab [erythromycin] Hives   Hctz [hydrochlorothiazide ] Other (See Comments)   Photosensitivity  Burning sensation    Iodine Dermatitis, Other (See Comments)   Localized skin blisters around application site when applied topically   Keflex [cephalexin] Diarrhea, Nausea And Vomiting   body rejects it   Lipitor [atorvastatin ]    Norvasc  [amlodipine ] Other (See Comments)   Unknown reaction   Tenormin  [atenolol ] Other (See Comments)   Unknown reaction   Toprol  Xl [metoprolol  Succinate] Other (See Comments)   Unknown reaction   Vasotec [enalapril] Cough   Vioxx [rofecoxib] Hives, Nausea And Vomiting   Zocor [simvastatin] Nausea And Vomiting   Latex Rash        Medication List     STOP taking these medications    bisoprolol  5 MG tablet Commonly known as: ZEBETA    cyclobenzaprine  5 MG tablet Commonly known as: FLEXERIL    senna-docusate 8.6-50 MG tablet Commonly known as: Senokot-S       TAKE these medications    acetaminophen  325 MG tablet Commonly known as: TYLENOL  Take 1-2 tablets (325-650 mg total) by mouth every 6 (six) hours as needed for mild pain.   amiodarone  200 MG tablet Commonly known as:  PACERONE  Take 0.5 tablets (100 mg total) by mouth daily. Start taking on: April 21, 2024 What changed: how much to take   atorvastatin  40 MG tablet Commonly known as: LIPITOR Take 1 tablet (40 mg total) by mouth daily.   ezetimibe  10 MG tablet Commonly known as: ZETIA  Take 1 tablet (10 mg total) by mouth daily.   hydrALAZINE  50 MG tablet Commonly known as: APRESOLINE  Take 1 tablet (50 mg total) by mouth 2 (two) times daily.   isosorbide  dinitrate 30 MG tablet Commonly known as: ISORDIL  Take 1 tablet (30 mg total) by mouth 2 (two) times daily.   losartan  25 MG tablet Commonly known as: COZAAR  Take 1 tablet (25 mg total) by mouth daily. Start taking on: April 21, 2024   nebivolol  10 MG tablet Commonly known as: BYSTOLIC  Take 2 tablets (20 mg total) by mouth daily. Start taking on: April 21, 2024   Oxycodone  HCl 10 MG Tabs Take 1 tablet (10 mg total) by mouth every 4 (four) hours as needed for severe pain (pain score 7-10).   polyethylene glycol 17 g packet Commonly known as: MIRALAX  / GLYCOLAX  Take 17 g by mouth daily.   Xarelto  20 MG Tabs tablet Generic drug: rivaroxaban  Take 1 tablet (20 mg total) by mouth daily with supper. Start taking on: April 21, 2024 What changed:  medication strength how much  to take when to take this Another medication with the same name was removed. Continue taking this medication, and follow the directions you see here.        Contact information for follow-up providers     Loring Tanda Mae, MD Follow up in 2 week(s).   Specialty: Family Medicine Why: Hospital follow up Contact information: [address] [place] [address] [phone]         Lelon Hamilton T, PA-C Follow up.   Specialties: Cardiology, Physician Assistant Why: Hospital follow up Contact information: [address] [place] [address] [phone]              Contact information for after-discharge care      Home Medical Care     [facility]) Follow up.   Service: Home Health Services Contact information: [address] [place]   [address] [phone]                    Discharge Exam: Fredricka Weights   04/18/24 0500 04/19/24 0449 04/20/24 0500  Weight: 91.3 kg 91.8 kg 91.4 kg   General exam: Awake, laying in bed, in nad Respiratory system: Normal respiratory effort, no wheezing Cardiovascular system: regular rate, s1, s2 Gastrointestinal system: Soft, nondistended, positive BS Central nervous system: CN2-12 grossly intact, strength intact Extremities: Perfused, no clubbing Skin: Normal skin turgor, no notable skin lesions seen Psychiatry: Mood normal // no visual hallucinations   Condition at discharge: fair  The results of significant diagnostics from this hospitalization (including imaging, microbiology, ancillary and laboratory) are listed below for reference.   Imaging Studies: VAS US  LOWER EXTREMITY VENOUS (DVT) Result Date: 04/16/2024  Lower Venous DVT Study Patient Name:  DAISON BRAXTON  Date of Exam:   04/16/2024 Medical Rec #: [id number]      Accession #:    [id number] Date of Birth: Jun 08, 1950      Patient Gender: M Patient Age:   23 years Exam Location:  [facility] Procedure:      VAS US  LOWER EXTREMITY VENOUS (DVT) Referring Phys: BURNARD GRIFFITH --------------------------------------------------------------------------------  Indications: Edema.  Comparison Study: Previous study of the right lower extremity on 12.14.2025 and                   previous bilateral study on 11.9.2025. Performing Technologist: Edilia Elden Appl  Examination Guidelines: A complete evaluation includes B-mode imaging, spectral Doppler, color Doppler, and power Doppler as needed of all accessible portions of each vessel. Bilateral testing is considered an integral part of a complete examination. Limited examinations for  reoccurring indications may be performed as noted. The reflux portion of the exam is performed with the patient in reverse Trendelenburg.  +---------+---------------+---------+-----------+----------+--------------+ RIGHT    CompressibilityPhasicitySpontaneityPropertiesThrombus Aging +---------+---------------+---------+-----------+----------+--------------+ CFV      Full           Yes      Yes                                 +---------+---------------+---------+-----------+----------+--------------+ SFJ      Full           Yes      Yes                                 +---------+---------------+---------+-----------+----------+--------------+ FV Prox  Full                                                        +---------+---------------+---------+-----------+----------+--------------+ FV Mid   Full                                                        +---------+---------------+---------+-----------+----------+--------------+ FV DistalFull                                                        +---------+---------------+---------+-----------+----------+--------------+ PFV      Full                                                        +---------+---------------+---------+-----------+----------+--------------+ POP      Full           Yes      Yes                                 +---------+---------------+---------+-----------+----------+--------------+ PTV      Full                                                        +---------+---------------+---------+-----------+----------+--------------+ PERO     Full                                                        +---------+---------------+---------+-----------+----------+--------------+   +---------+---------------+---------+-----------+----------+-------------------+ LEFT     CompressibilityPhasicitySpontaneityPropertiesThrombus Aging       +---------+---------------+---------+-----------+----------+-------------------+ CFV      Full           Yes      Yes                                      +---------+---------------+---------+-----------+----------+-------------------+ SFJ      Full           Yes      Yes                                      +---------+---------------+---------+-----------+----------+-------------------+ FV Prox  Full                                                             +---------+---------------+---------+-----------+----------+-------------------+  FV Mid   Full                                                             +---------+---------------+---------+-----------+----------+-------------------+ FV DistalFull                                                             +---------+---------------+---------+-----------+----------+-------------------+ PFV      Full                                                             +---------+---------------+---------+-----------+----------+-------------------+ POP      Full           Yes      Yes                                      +---------+---------------+---------+-----------+----------+-------------------+ PTV      Full                                                             +---------+---------------+---------+-----------+----------+-------------------+ PERO                                                  Not well visualized +---------+---------------+---------+-----------+----------+-------------------+     Summary: RIGHT: - There is no evidence of deep vein thrombosis in the lower extremity.  - No cystic structure found in the popliteal fossa.  LEFT: - There is no evidence of deep vein thrombosis in the lower extremity. However, portions of this examination were limited- see technologist comments above.  - No cystic structure found in the popliteal fossa.  *See table(s) above for measurements and  observations. Electronically signed by Fonda Rim on 04/16/2024 at 5:50:44 PM.    Final    US  RENAL Result Date: 04/15/2024 EXAM: US  Retroperitoneum Complete, Renal. 04/15/2024 11:05:12 PM TECHNIQUE: Real-time ultrasonography of the kidneys was performed. COMPARISON: CT dated 03/04/2024 CLINICAL HISTORY: Acute renal injury is the clinical history. FINDINGS: FINDINGS: RIGHT KIDNEY/URETER: Right kidney measures 10.1 x 4.6 x 5.2 cm. Calculated volume is 127 ml. Normal cortical echogenicity. No hydronephrosis. No definitive calculi are identified. No mass lesion is seen. LEFT KIDNEY/URETER: Left kidney measures 9.8 x 5.9 x 6.2 cm. Calculated volume is 185 ml. Normal cortical echogenicity. No hydronephrosis. No definitive calculi are identified. No mass lesion is seen. BLADDER: Unremarkable appearance of the bladder. IMPRESSION: 1. No acute findings. Electronically signed by: Oneil Devonshire MD 04/15/2024 11:11 PM EST RP Workstation: [id number]   US  Venous Img Lower  Unilateral Right Result Date: 04/15/2024 CLINICAL DATA:  leg swelling r> L EXAM: RIGHT LOWER EXTREMITY VENOUS DOPPLER ULTRASOUND TECHNIQUE: Gray-scale sonography with compression, as well as color and duplex ultrasound, were performed to evaluate the deep venous system(s) from the level of the common femoral vein through the popliteal and proximal calf veins. COMPARISON:  None Available. FINDINGS: VENOUS Normal compressibility of the common femoral, superficial femoral, and popliteal veins, as well as the visualized calf veins. Visualized portions of profunda femoral vein and great saphenous vein unremarkable. No filling defects to suggest DVT on grayscale or color Doppler imaging. Doppler waveforms show normal direction of venous flow, normal respiratory plasticity and response to augmentation. Limited views of the contralateral common femoral vein are unremarkable. OTHER None. Limitations: none IMPRESSION: Negative. Electronically Signed   By:  Corean Salter M.D.   On: 04/15/2024 12:21   DG Knee Complete 4 Views Right Result Date: 04/15/2024 CLINICAL DATA:  Right knee swelling and pain. EXAM: RIGHT KNEE - COMPLETE 4 VIEW COMPARISON:  None Available. FINDINGS: No evidence of fracture, dislocation, or joint effusion. Severe tricompartmental osteoarthritis is seen with tibia varus. Femoral intramedullary rod noted. Generalized osteopenia. Peripheral vascular calcification. Diffuse subcutaneous edema. IMPRESSION: Diffuse subcutaneous edema. No acute osseous abnormality. Severe tricompartmental osteoarthritis. Electronically Signed   By: Norleen DELENA Kil M.D.   On: 04/15/2024 11:47   VAS US  ABI WITH/WO TBI Result Date: 04/04/2024  LOWER EXTREMITY DOPPLER STUDY Patient Name:  YOUCEF KLAS  Date of Exam:   04/04/2024 Medical Rec #: [id number]      Accession #:    [id number] Date of Birth: 01-18-1951      Patient Gender: M Patient Age:   20 years Exam Location:  [address] Procedure:      VAS US  ABI WITH/WO TBI Referring Phys: JOSHUA ROBINS --------------------------------------------------------------------------------  Indications: Claudication. High Risk Factors: Hypertension.  Comparison Study: 06/16/23 Performing Technologist: Garnette Rockers  Examination Guidelines: A complete evaluation includes at minimum, Doppler waveform signals and systolic blood pressure reading at the level of bilateral brachial, anterior tibial, and posterior tibial arteries, when vessel segments are accessible. Bilateral testing is considered an integral part of a complete examination. Photoelectric Plethysmograph (PPG) waveforms and toe systolic pressure readings are included as required and additional duplex testing as needed. Limited examinations for reoccurring indications may be performed as noted.  ABI Findings: +---------+------------------+-----+-----------+--------+ Right    Rt Pressure (mmHg)IndexWaveform   Comment   +---------+------------------+-----+-----------+--------+ Brachial 174                                        +---------+------------------+-----+-----------+--------+ PTA      150               0.78 multiphasic         +---------+------------------+-----+-----------+--------+ DP       185               0.96 triphasic           +---------+------------------+-----+-----------+--------+ Great Toe164               0.85 Normal              +---------+------------------+-----+-----------+--------+ +---------+------------------+-----+-----------+-------+ Left     Lt Pressure (mmHg)IndexWaveform   Comment +---------+------------------+-----+-----------+-------+ Brachial 193                                       +---------+------------------+-----+-----------+-------+  PTA      176               0.91 multiphasic        +---------+------------------+-----+-----------+-------+ DP       139               0.72 multiphasic        +---------+------------------+-----+-----------+-------+ Great Toe83                0.43 Abnormal           +---------+------------------+-----+-----------+-------+ +-------+-----------+-----------+------------+------------+ ABI/TBIToday's ABIToday's TBIPrevious ABIPrevious TBI +-------+-----------+-----------+------------+------------+ Right  0.96       0.85       0.96        Edgar           +-------+-----------+-----------+------------+------------+ Left   0.91       0.43       0.88        0.56         +-------+-----------+-----------+------------+------------+  Summary: Right: Resting right ankle-brachial index is within normal range. The right toe-brachial index is normal.  Left: Resting left ankle-brachial index indicates mild left lower extremity arterial disease. The left toe-brachial index is abnormal. Left toe pressure is >60 mmHg which suggests adequate perfusion for healing. *See table(s) above for measurements and  observations.  Electronically signed by Debby Robertson on 04/04/2024 at 4:24:28 PM.    Final    XR FEMUR, MIN 2 VIEWS RIGHT Result Date: 03/22/2024 XRs of the right femur from 03/22/2024 were independently reviewed and interpreted, showing a minimally displaced intertrochanteric femur fracture.  Fracture alignment appears unchanged from prior films on 11//2025.  No lucency seen around the lag or distal interlocking screws.  No new fracture seen.   Microbiology: Results for orders placed or performed during the hospital encounter of 03/03/24  MRSA Next Gen by PCR, Nasal     Status: None   Collection Time: 03/03/24  7:47 PM   Specimen: Nasal Mucosa; Nasal Swab  Result Value Ref Range Status   MRSA by PCR Next Gen NOT DETECTED NOT DETECTED Final    Comment: (NOTE) The GeneXpert MRSA Assay (FDA approved for NASAL specimens only), is one component of a comprehensive MRSA colonization surveillance program. It is not intended to diagnose MRSA infection nor to guide or monitor treatment for MRSA infections. Test performance is not FDA approved in patients less than 97 years old. Performed at [facility], [address], [place], [place] [address]     Labs: CBC: Recent Labs  Lab 04/15/24 1100 04/15/24 1622 04/16/24 0521 04/17/24 0553 04/18/24 0352 04/19/24 0226 04/20/24 0322  WBC 8.6   < > 6.7 7.2 8.5 8.3 9.4  NEUTROABS 5.7  --   --   --   --   --   --   HGB 8.5*   < > 7.7* 8.6* 8.4* 9.0* 8.4*  HCT 27.4*   < > 24.8* 27.6* 26.9* 28.3* 26.9*  MCV 98.6   < > 97.6 95.5 96.8 96.3 95.1  PLT 227   < > 198 220 249 253 293   < > = values in this interval not displayed.   Basic Metabolic Panel: Recent Labs  Lab 04/16/24 0521 04/17/24 0553 04/18/24 0352 04/19/24 0226 04/20/24 0322  NA 139 137 138 138 137  K 4.6 4.4 4.1 3.7 3.7  CL 111 110 108 107 107  CO2 23 20* 18* 20* 18*  GLUCOSE 89 93 91 102* 90  BUN  47* 33* 24* 23 21  CREATININE 1.91* 1.21 0.90 1.09 1.03  CALCIUM  8.0*  8.5* 8.9 8.7* 8.4*   Liver Function Tests: Recent Labs  Lab 04/15/24 1100 04/17/24 0553 04/18/24 0352 04/19/24 0226 04/20/24 0322  AST 19 15 14* 15 15  ALT 19 14 10 10 11   ALKPHOS 129* 91 105 101 99  BILITOT 1.0 1.1 0.9 0.7 0.6  PROT 6.8 6.1* 6.3* 6.1* 5.9*  ALBUMIN  3.7 2.9* 3.4* 3.4* 3.4*   CBG: No results for input(s): GLUCAP in the last 168 hours.  Discharge time spent: less than 30 minutes.  Signed: Garnette Pelt, MD [facility] 04/20/2024 "

## 2024-04-20 NOTE — Progress Notes (Signed)
 Physical Therapy Treatment Patient Details Name: [name] [name] MRN: [id number] DOB: 11-09-1950 Today's Date: 04/20/2024   History of Present Illness Pt is 73 yo presenting to [facility] On 12/14 due to urinary retention.  PMH: recent CVA (small left occipital infarct) resulting in fall, resulting in R hip fx s/p ORIF 11/14, alcohol  abuse, CHF, cirrhosis of the liver, esophageal varices, GERD, OSA, HTN, HLD, sleep apnea, and vocal cord paralysis on the left    PT Comments  Pt received in recliner, alert and agreeable to therapy session, pt following instructions well and progressed to modI for short distance gait in room to/from bathroom. Pt Supervision to modI for transfers depending on surface height. Pt requesting assist with posterior peri-care, able to perform his own with encouragement, but request PTA assist after that for thoroughness, stating you all do it better. When PTA asked who will help him with this at home, he reports a friend of 50 years is staying with him and he is okay with her assisting him with hygiene/toileting PRN. Pt continues to benefit from PT services to progress toward functional mobility goals, continue to recommend HHPT upon DC per patient/family request, although he is likely approaching his recent baseline.    If plan is discharge home, recommend the following: Assist for transportation;Assistance with cooking/housework   Can travel by private vehicle        Equipment Recommendations  None recommended by PT    Recommendations for Other Services       Precautions / Restrictions Precautions Precautions: Fall Recall of Precautions/Restrictions: Intact Precaution/Restrictions Comments: some bowel incontinence so PTA obtained Depends type briefs for him for DC home, pt donned them with supervision Restrictions Weight Bearing Restrictions Per Provider Order: No Other Position/Activity Restrictions: WBAT per MD surgical note [name] Ada MD on 11/4     Mobility   Bed Mobility               General bed mobility comments: pt in chair upon arrival and departure    Transfers Overall transfer level: Needs assistance Equipment used: Rolling walker (2 wheels) Transfers: Sit to/from Stand Sit to Stand: Supervision, Modified independent (Device/Increase time)           General transfer comment: Pt recalls safe UE placement and RW management without reminders, given supervision in bathroom due to low toilet surface, but no lift or steadying assist needed this date.    Ambulation/Gait Ambulation/Gait assistance: Modified independent (Device/Increase time) Gait Distance (Feet): 15 Feet (x2 with seated break on toilet for BM) Assistive device: Rolling walker (2 wheels) Gait Pattern/deviations: Step-through pattern, Decreased step length - left, Decreased stance time - right, Wide base of support, Trunk flexed       General Gait Details: Slight trunk flexion but pt managing RW well, likely close to his baseline; no buckling or LOB or significant dypsnea today; distance limited due to pt toileting needs then RN arriving to take out his IV and review DC paperwork.   Stairs Stairs:  (pt denies need to perform stairs or car transfers prior to DC)           Wheelchair Mobility     Tilt Bed    Modified Rankin (Stroke Patients Only)       Balance Overall balance assessment: Modified Independent         Standing balance support: Bilateral upper extremity supported, During functional activity Standing balance-Leahy Scale: Poor (poor without AD, fair to good with RW) Standing balance comment: Fair with  RW and single UE support while performing his own posterior peri-care                            Communication Communication Communication: Impaired Factors Affecting Communication: Hearing impaired  Cognition Arousal: Alert Behavior During Therapy: WFL for tasks assessed/performed   PT - Cognitive impairments: No  apparent impairments                         Following commands: Intact      Cueing Cueing Techniques: Verbal cues  Exercises      General Comments General comments (skin integrity, edema, etc.): no s/sx distress, DOE 0/4 today; pt does not have shoes to wear home but denies concern with this, his brother is coming to bring him home and pt reports he has level concrete to go from car into home.      Pertinent Vitals/Pain Pain Assessment Pain Assessment: No/denies pain    Home Living                          Prior Function            PT Goals (current goals can now be found in the care plan section) Acute Rehab PT Goals Patient Stated Goal: to go home PT Goal Formulation: With patient Time For Goal Achievement: 04/30/24 Progress towards PT goals: Progressing toward goals    Frequency    Min 2X/week      PT Plan      Co-evaluation              AM-PAC PT 6 Clicks Mobility   Outcome Measure  Help needed turning from your back to your side while in a flat bed without using bedrails?: None Help needed moving from lying on your back to sitting on the side of a flat bed without using bedrails?: A Little Help needed moving to and from a bed to a chair (including a wheelchair)?: None Help needed standing up from a chair using your arms (e.g., wheelchair or bedside chair)?: None Help needed to walk in hospital room?: None Help needed climbing 3-5 steps with a railing? : A Lot 6 Click Score: 21    End of Session Equipment Utilized During Treatment: Other (comment) (pt defers gait belt; has pants donned at end of session and reports he has a gait belt at home if he needs one) Activity Tolerance: Patient tolerated treatment well Patient left: in chair;with call bell/phone within reach;with chair alarm set Nurse Communication: Mobility status;Other (comment) (pt needs IV removed; he has disposable briefs on so he can go to DC lounge) PT Visit  Diagnosis: Unsteadiness on feet (R26.81);Other abnormalities of gait and mobility (R26.89);Pain;Muscle weakness (generalized) (M62.81) Pain - Right/Left: Right Pain - part of body: Knee     Time: 8974-8958 PT Time Calculation (min) (ACUTE ONLY): 16 min  Charges:    $Therapeutic Activity: 8-22 mins PT General Charges $$ ACUTE PT VISIT: 1 Visit                     Tomie Elko P., PTA Acute Rehabilitation Services Secure Chat Preferred 9a-5:30pm Office: [phone]    [facility] 04/20/2024, 10:55 AM

## 2024-04-20 NOTE — Discharge Instructions (Signed)
 Information on my medicine - XARELTO (Rivaroxaban)  Why was Xarelto prescribed for you? Xarelto was prescribed for you to reduce the risk of a blood clot forming that can cause a stroke if you have a medical condition called atrial fibrillation (a type of irregular heartbeat).  What do you need to know about xarelto ? Take your Xarelto ONCE DAILY at the same time every day with your evening meal. If you have difficulty swallowing the tablet whole, you may crush it and mix in applesauce just prior to taking your dose.  Take Xarelto exactly as prescribed by your doctor and DO NOT stop taking Xarelto without talking to the doctor who prescribed the medication.  Stopping without other stroke prevention medication to take the place of Xarelto may increase your risk of developing a clot that causes a stroke.  Refill your prescription before you run out.  After discharge, you should have regular check-up appointments with your healthcare provider that is prescribing your Xarelto.  In the future your dose may need to be changed if your kidney function or weight changes by a significant amount.  What do you do if you miss a dose? If you are taking Xarelto ONCE DAILY and you miss a dose, take it as soon as you remember on the same day then continue your regularly scheduled once daily regimen the next day. Do not take two doses of Xarelto at the same time or on the same day.   Important Safety Information A possible side effect of Xarelto is bleeding. You should call your healthcare provider right away if you experience any of the following: ? Bleeding from an injury or your nose that does not stop. ? Unusual colored urine (red or dark brown) or unusual colored stools (red or black). ? Unusual bruising for unknown reasons. ? A serious fall or if you hit your head (even if there is no bleeding).  Some medicines may interact with Xarelto and might increase your risk of bleeding while on  Xarelto. To help avoid this, consult your healthcare provider or pharmacist prior to using any new prescription or non-prescription medications, including herbals, vitamins, non-steroidal anti-inflammatory drugs (NSAIDs) and supplements.  This website has more information on Xarelto: VisitDestination.com.br.

## 2024-04-20 NOTE — Plan of Care (Signed)
   Problem: Education: Goal: Knowledge of General Education information will improve Description Including pain rating scale, medication(s)/side effects and non-pharmacologic comfort measures Outcome: Progressing

## 2024-05-07 ENCOUNTER — Encounter: Payer: Self-pay | Admitting: Orthopedic Surgery

## 2024-05-07 ENCOUNTER — Encounter: Admitting: Orthopedic Surgery

## 2024-05-07 ENCOUNTER — Other Ambulatory Visit (INDEPENDENT_AMBULATORY_CARE_PROVIDER_SITE_OTHER)

## 2024-05-07 VITALS — BP 152/58 | HR 45 | Ht <= 58 in | Wt 201.5 lb

## 2024-05-07 DIAGNOSIS — S72141A Displaced intertrochanteric fracture of right femur, initial encounter for closed fracture: Secondary | ICD-10-CM

## 2024-05-07 NOTE — Progress Notes (Signed)
 Orthopedic Surgery Post-operative Office Visit   Procedure: right intertrochanteric femur fracture CMR Date of Surgery: 03/06/2024 (~2 months post-op)   Assessment: Patient is a 74 y.o. who is doing well after surgery     Plan: -Operative plans complete -Weight bearing as tolerated right lower extremity -Activity as tolerated -Pain management: Tylenol  as needed -Return to office in 6 weeks, x-rays needed at next visit: AP/lateral right femur   ___________________________________________________________________________     Subjective: Patient has been pleased with how he has been doing since he was last in the office.  Pain has gotten much better and he says that he is not having any pain in the hip at this point. He has been having more pain in the knee.  He has gotten an injection in the past.  He is getting another injection with a different provider in about a week.   Objective:   General: no acute distress, appropriate affect Neurologic: alert, answering questions appropriately, following commands Respiratory: unlabored breathing on room air Skin: incisions are well healed   MSK (RLE): EHL/TA/GSC intact, SILT in s/s/dp/sp/t nerve distributions, foot warm and well perfused   Imaging: XRs of the right femur from 05/08/2023 were independently reviewed and interpreted, showing a minimally displaced intertrochanteric femur fracture.  No significant callus formation seen.  Alignment unchanged since prior films on 03/22/2024.  No acute fracture seen.  No dislocation seen.  No lucency seen around the interlocking screws or the lag screws.    Patient name: [name] [name] Patient MRN: [id number] Date of visit: 05/07/2024

## 2024-05-14 ENCOUNTER — Ambulatory Visit: Attending: Internal Medicine | Admitting: Internal Medicine

## 2024-05-14 ENCOUNTER — Other Ambulatory Visit (HOSPITAL_COMMUNITY): Payer: Self-pay

## 2024-05-14 VITALS — BP 180/60 | HR 49 | Ht 67.0 in | Wt 203.0 lb

## 2024-05-14 DIAGNOSIS — I350 Nonrheumatic aortic (valve) stenosis: Secondary | ICD-10-CM

## 2024-05-14 DIAGNOSIS — I160 Hypertensive urgency: Secondary | ICD-10-CM

## 2024-05-14 DIAGNOSIS — I5020 Unspecified systolic (congestive) heart failure: Secondary | ICD-10-CM

## 2024-05-14 DIAGNOSIS — I119 Hypertensive heart disease without heart failure: Secondary | ICD-10-CM

## 2024-05-14 DIAGNOSIS — I480 Paroxysmal atrial fibrillation: Secondary | ICD-10-CM | POA: Diagnosis not present

## 2024-05-14 MED ORDER — AMIODARONE HCL 200 MG PO TABS
100.0000 mg | ORAL_TABLET | Freq: Every day | ORAL | 3 refills | Status: AC
  Filled 2024-05-14: qty 45, 90d supply, fill #0

## 2024-05-14 MED ORDER — ISOSORBIDE DINITRATE 30 MG PO TABS
30.0000 mg | ORAL_TABLET | Freq: Two times a day (BID) | ORAL | 3 refills | Status: AC
  Filled 2024-05-14 – 2024-05-18 (×2): qty 90, 45d supply, fill #0

## 2024-05-14 MED ORDER — HYDRALAZINE HCL 100 MG PO TABS
100.0000 mg | ORAL_TABLET | Freq: Three times a day (TID) | ORAL | 1 refills | Status: DC
  Filled 2024-05-14: qty 270, 90d supply, fill #0

## 2024-05-14 MED ORDER — NEBIVOLOL HCL 10 MG PO TABS
20.0000 mg | ORAL_TABLET | Freq: Every day | ORAL | 3 refills | Status: DC
  Filled 2024-05-14: qty 180, 90d supply, fill #0

## 2024-05-14 MED ORDER — RIVAROXABAN 20 MG PO TABS
20.0000 mg | ORAL_TABLET | Freq: Every day | ORAL | 5 refills | Status: AC
  Filled 2024-05-14: qty 30, 30d supply, fill #0
  Filled 2024-06-08: qty 30, 30d supply, fill #1

## 2024-05-14 MED ORDER — EZETIMIBE 10 MG PO TABS
10.0000 mg | ORAL_TABLET | Freq: Every day | ORAL | 3 refills | Status: AC
  Filled 2024-05-14 – 2024-05-18 (×2): qty 90, 90d supply, fill #0

## 2024-05-14 MED ORDER — LOSARTAN POTASSIUM 25 MG PO TABS
25.0000 mg | ORAL_TABLET | Freq: Every day | ORAL | 3 refills | Status: AC
  Filled 2024-05-14 – 2024-05-18 (×2): qty 90, 90d supply, fill #0

## 2024-05-14 MED ORDER — HYDRALAZINE HCL 100 MG PO TABS
100.0000 mg | ORAL_TABLET | Freq: Two times a day (BID) | ORAL | 1 refills | Status: AC
  Filled 2024-05-14: qty 180, 90d supply, fill #0

## 2024-05-14 MED ORDER — ATORVASTATIN CALCIUM 40 MG PO TABS
40.0000 mg | ORAL_TABLET | Freq: Every day | ORAL | 3 refills | Status: AC
  Filled 2024-05-14: qty 90, 90d supply, fill #0

## 2024-05-14 NOTE — Patient Instructions (Addendum)
 Medication Instructions:  Your physician has recommended you make the following change in your medication:  INCREASE: Hydralazine  to 100 mg by mouth twice daily  *If you need a refill on your cardiac medications before your next appointment, please call your pharmacy*  Lab Work: IN 2 WEEKS at any [facility]: BNP and CMP  If you have labs (blood work) drawn today and your tests are completely normal, you will receive your results only by: [employer] (if you have MyChart) OR A paper copy in the mail If you have any lab test that is abnormal or we need to change your treatment, we will call you to review the results.  Testing/Procedures: NONE   Follow-Up:As scheduled  At [facility], you and your health needs are our priority.  As part of our continuing mission to provide you with exceptional heart care, our providers are all part of one team.  This team includes your primary Cardiologist (physician) and Advanced Practice Providers or APPs (Physician Assistants and Nurse Practitioners) who all work together to provide you with the care you need, when you need it.   Provider:   Glendia Ferrier, PA-C       Other Instructions

## 2024-05-14 NOTE — Progress Notes (Signed)
 " Cardiology Office Note:  .    Date:  05/14/2024  ID:  [name] [name], DOB [birth year], MRN [id number] PCP: [name] Tanda Mae, MD  [facility] Providers Cardiologist:  [name] DELENA Leavens, MD     CC: Secondary prevention  History of Present Illness: .    [name] [name] is a patient with history of HTN and AS seen to establish care in 2024.  Saw SW and I to support his care.  Mr.  [name] is a 74 year old male with hypertension and moderate aortic stenosis who presents for medication management and follow-up. He is accompanied by his son-in-law, who assists with note-taking and medication management.  He has a history of hypertension and moderate aortic stenosis. His current medications include bisoprolol , rivaroxaban , and hydralazine . There are concerns about medication adherence, as he has been taking 20 mg of bisoprolol  instead of the prescribed 5 mg. His blood pressure is consistently high, and his heart rate is low. He reports that his blood pressure is 'always high'.  He has a history of atrial tachycardia and was started on bisoprolol . He also experienced a stroke in the past and received TNK. He is on rivaroxaban  to decrease the risk of stroke. His heart rhythm issues include atrial fibrillation and atrial flutter, and he is on medication to manage these conditions.  He experiences shortness of breath, particularly when busy, which started three to four days ago. No chest pain or breathing issues at rest. He has lower extremity swelling and has not been on Lasix  at home since November.  He has anemia with a stable complete blood count (CBC) and his LDL cholesterol levels have not reached the target goal. His albumin  level is low at 3.4.  He has caregivers who assist with his medication management, and they are present for 12 hours a day. He manages his medications by placing them in a box, but caregivers are responsible for administering them in the morning and evening.         Relevant histories: .  Social  2015: Found to have AS 2016: Lost his wife 2024: Returned to cardiology, HTN urgency 2025: Elevated BP at vascular evaluation, then Severe HTN in ED with AKI DYAD Care: Me and [name] [name] ROS: As per HPI.   Studies Reviewed: .   Cardiac Studies & Procedures   ______________________________________________________________________________________________   STRESS TESTS  MYOCARDIAL PERFUSION IMAGING 08/10/2022  Interpretation Summary   Markedly hypertensive, resting BP 215/86, increased to 264/128 following stress   The study is normal. The study is low risk.   No ST deviation was noted.   LV perfusion is abnormal. There is no evidence of ischemia. There is no evidence of infarction. Defect 1: There is a medium defect with mild reduction in uptake present in the apical to basal inferior location(s) that is fixed. There is normal wall motion in the defect area. Consistent with artifact caused by diaphragmatic attenuation.   Left ventricular function is abnormal. Global function is mildly reduced. Nuclear stress EF: 52 %. The left ventricular ejection fraction is mildly decreased (45-54%). End diastolic cavity size is severely enlarged. End systolic cavity size is severely enlarged.   Prior study not available for comparison.  Markedly hypertensive, resting BP 215/86, increased to 264/128 following stress Fixed inferior perfusion defect with normal wall motion, consistent with artifact LV is dilated, EDV .  Low normal EF (52%) Low risk study   ECHOCARDIOGRAM  ECHOCARDIOGRAM COMPLETE 03/04/2024  Narrative ECHOCARDIOGRAM REPORT  Patient Name:   [name] [name] Date of Exam: 03/04/2024 Medical Rec #:  [id number]     Height:       66.0 in Accession #:    [id number]    Weight:       198.0 lb Date of Birth:  1950/05/08     BSA:          1.991 m Patient Age:    73 years      BP:           163/99 mmHg Patient Gender: M             HR:           103  bpm. Exam Location:  Inpatient  Procedure: 2D Echo (Both Spectral and Color Flow Doppler were utilized during procedure).  Indications:    stroke  History:        Patient has prior history of Echocardiogram examinations, most recent 08/09/2023. Cirrhosis; Risk Factors:Hypertension, Dyslipidemia and Sleep Apnea.  Sonographer:    Tinnie Barefoot RDCS Referring Phys: [id number] [name] [name]   Sonographer Comments: Technically difficult study due to poor echo windows and suboptimal subcostal window. Image acquisition challenging due to respiratory motion. IMPRESSIONS   1. Left ventricular ejection fraction, by estimation, is >75%. The left ventricle has hyperdynamic function. The left ventricle has no regional wall motion abnormalities. There is mild left ventricular hypertrophy. Left ventricular diastolic parameters are consistent with Grade I diastolic dysfunction (impaired relaxation). 2. Right ventricular systolic function is normal. The right ventricular size is normal. 3. The mitral valve is normal in structure. No evidence of mitral valve regurgitation. No evidence of mitral stenosis. The mean mitral valve gradient is 3.7 mmHg. Moderate mitral annular calcification. 4. The aortic valve is normal in structure. Aortic valve regurgitation is not visualized. No aortic stenosis is present. 5. The inferior vena cava is normal in size with greater than 50% respiratory variability, suggesting right atrial pressure of 3 mmHg.  Conclusion(s)/Recommendation(s): No intracardiac source of embolism detected on this transthoracic study. Consider a transesophageal echocardiogram to exclude cardiac source of embolism if clinically indicated.  FINDINGS Left Ventricle: Left ventricular ejection fraction, by estimation, is >75%. The left ventricle has hyperdynamic function. The left ventricle has no regional wall motion abnormalities. Definity  contrast agent was given IV to delineate the left ventricular  endocardial borders. The left ventricular internal cavity size was normal in size. There is mild left ventricular hypertrophy. Left ventricular diastolic parameters are consistent with Grade I diastolic dysfunction (impaired relaxation).  Right Ventricle: The right ventricular size is normal. No increase in right ventricular wall thickness. Right ventricular systolic function is normal.  Left Atrium: Left atrial size was normal in size.  Right Atrium: Right atrial size was normal in size.  Pericardium: There is no evidence of pericardial effusion.  Mitral Valve: The mitral valve is normal in structure. Moderate mitral annular calcification. No evidence of mitral valve regurgitation. No evidence of mitral valve stenosis. MV peak gradient, 11.5 mmHg. The mean mitral valve gradient is 3.7 mmHg.  Tricuspid Valve: The tricuspid valve is normal in structure. Tricuspid valve regurgitation is not demonstrated. No evidence of tricuspid stenosis.  Aortic Valve: The aortic valve is normal in structure. Aortic valve regurgitation is not visualized. No aortic stenosis is present. Aortic valve mean gradient measures 13.7 mmHg. Aortic valve peak gradient measures 26.6 mmHg. Aortic valve area, by VTI measures 1.27 cm.  Pulmonic Valve: The pulmonic valve was normal in structure. Pulmonic valve  regurgitation is not visualized. No evidence of pulmonic stenosis.  Aorta: The aortic root is normal in size and structure.  Venous: The inferior vena cava is normal in size with greater than 50% respiratory variability, suggesting right atrial pressure of 3 mmHg.  IAS/Shunts: No atrial level shunt detected by color flow Doppler.   LEFT VENTRICLE PLAX 2D LVIDd:         3.80 cm   Diastology LVIDs:         2.10 cm   LV e' medial:    6.20 cm/s LV PW:         1.30 cm   LV E/e' medial:  8.3 LV IVS:        1.30 cm   LV e' lateral:   8.38 cm/s LVOT diam:     1.80 cm   LV E/e' lateral: 6.1 LV SV:         44 LV SV  Index:   22 LVOT Area:     2.54 cm   RIGHT VENTRICLE RV Basal diam:  2.70 cm RV S prime:     23.00 cm/s  LEFT ATRIUM           Index        RIGHT ATRIUM           Index LA diam:      3.60 cm 1.81 cm/m   RA Area:     13.60 cm LA Vol (A4C): 43.5 ml 21.85 ml/m  RA Volume:   30.00 ml  15.07 ml/m AORTIC VALVE AV Area (Vmax):    1.21 cm AV Area (Vmean):   1.23 cm AV Area (VTI):     1.27 cm AV Vmax:           257.67 cm/s AV Vmean:          164.333 cm/s AV VTI:            0.342 m AV Peak Grad:      26.6 mmHg AV Mean Grad:      13.7 mmHg LVOT Vmax:         123.00 cm/s LVOT Vmean:        79.550 cm/s LVOT VTI:          0.171 m LVOT/AV VTI ratio: 0.50  AORTA Ao Root diam: 3.50 cm  MITRAL VALVE MV Area VTI:  1.26 cm      SHUNTS MV Peak grad: 11.5 mmHg     Systemic VTI:  0.17 m MV Mean grad: 3.7 mmHg      Systemic Diam: 1.80 cm MV Vmax:      1.69 m/s MV Vmean:     87.2 cm/s MV E velocity: 51.40 cm/s MV A velocity: 146.00 cm/s MV E/A ratio:  0.35  Oneil Parchment MD Electronically signed by Oneil Parchment MD Signature Date/Time: 03/04/2024/12:54:41 PM    Final          ______________________________________________________________________________________________      Physical Exam:    VS:  BP (!) 180/60 (BP Location: Right Arm)   Pulse (!) 49   Ht 5' 7 (1.702 m)   Wt 203 lb (92.1 kg)   SpO2 93%   BMI 31.79 kg/m    Wt Readings from Last 3 Encounters:  05/14/24 203 lb (92.1 kg)  05/07/24 201 lb 8 oz (91.4 kg)  04/20/24 201 lb 8 oz (91.4 kg)    Gen: No distress Neck: No JVD, bilateral Frank's Sign Cardiac: No Rubs or Gallops, systolic murmur  with diminished A2 but distant heart sounds, regularly bradycardia, +2 radial pulses Respiratory: Clear to auscultation bilaterally, normal effort, normal  respiratory rate GI: Soft, nontender, non-distended  MS: Trace bilateral edema;  moves all extremities Integument: Skin feels warm Neuro:  At time of evaluation,  alert and oriented to person/place/time/situation  Psych: Normal affect, patient feels well   ASSESSMENT AND PLAN: .    Hypertension Chronic hypertension with consistently elevated blood pressure readings. Current management includes multiple antihypertensive medications. Concerns about polypharmacy and potential medication interactions. Recent hospitalization for acute kidney injury, with improved kidney function and potassium levels. Current blood pressure management is suboptimal, with a need for gradual medication adjustments to avoid exacerbating hypotension or polypharmacy issues. - Increased hydralazine  to 100 mg TID. - Ordered BNP and CMP in two weeks to assess fluid status and kidney function. - If BNP is elevated, will initiate Lasix  40 mg PO daily. - If BNP is normal and fluid status is stable, will switch losartan  to high dose irbesartan. - Continue follow-up with [name] [name] on February 11th. - Provided two copies of medication list for caregiver and family.  Moderate aortic stenosis Moderate aortic stenosis with potential need for future intervention. Current management focuses on monitoring and avoiding exacerbation of symptoms. Discussed potential future TAVR procedure if condition progresses. - Continue monitoring aortic stenosis progression.  Atrial fibrillation and atrial flutter Atrial fibrillation and atrial flutter managed with bisoprolol  and rivaroxaban . Current heart rate is low, likely due to medication effects. No acute symptoms reported. Discussed potential need to adjust medications if symptoms develop. - Continue bisoprolol  and rivaroxaban . - Monitor heart rate and symptoms.  Heart failure Recent hospitalization for acute kidney injury. Current management includes monitoring fluid status and nutritional status. Albumin  levels are low, indicating potential nutritional deficiencies. Discussed potential need for diuretics based on fluid status. - Ordered BNP and  CMP in two weeks to assess fluid status and nutritional status. - If BNP is elevated, will initiate Lasix  40 mg PO daily.  Anemia Stable CBC. Current management includes iron supplementation. No acute symptoms reported. - Continue iron supplementation.  Longitudinal care: The evaluation and management services provided today reflect the complexity inherent in caring for this patient, including the ongoing longitudinal relationship and management of multiple chronic conditions and/or the need for care coordination. The visit required a comprehensive assessment and management plan tailored to the patient's unique needs Time was spent addressing not only the acute concerns but also the broader context of the patient's health, including preventive care, chronic disease management, and care coordination as appropriate.  Complex longitudinal is necessary for conditions including: AS care in complex patient with variable medication adherence-> in interval this has improved.  I suspect he will need monthly visits with our team or PCP's team to improve BP .   [name] Leavens, MD FASE [facility] Cardiologist [facility]  [address] [place], [address] [place], [place] [address] [phone]  5:31 PM  "

## 2024-05-15 ENCOUNTER — Other Ambulatory Visit: Payer: Self-pay

## 2024-05-15 ENCOUNTER — Emergency Department (HOSPITAL_COMMUNITY)

## 2024-05-15 ENCOUNTER — Inpatient Hospital Stay (HOSPITAL_COMMUNITY)
Admission: EM | Admit: 2024-05-15 | Discharge: 2024-05-18 | DRG: 291 | Disposition: A | Discharge status: Home Health | Attending: Internal Medicine | Admitting: Internal Medicine

## 2024-05-15 DIAGNOSIS — K7030 Alcoholic cirrhosis of liver without ascites: Secondary | ICD-10-CM | POA: Diagnosis present

## 2024-05-15 DIAGNOSIS — I160 Hypertensive urgency: Secondary | ICD-10-CM | POA: Diagnosis present

## 2024-05-15 DIAGNOSIS — D638 Anemia in other chronic diseases classified elsewhere: Secondary | ICD-10-CM | POA: Diagnosis present

## 2024-05-15 DIAGNOSIS — Z91048 Other nonmedicinal substance allergy status: Secondary | ICD-10-CM | POA: Diagnosis not present

## 2024-05-15 DIAGNOSIS — Z79899 Other long term (current) drug therapy: Secondary | ICD-10-CM

## 2024-05-15 DIAGNOSIS — Z833 Family history of diabetes mellitus: Secondary | ICD-10-CM

## 2024-05-15 DIAGNOSIS — K7031 Alcoholic cirrhosis of liver with ascites: Secondary | ICD-10-CM | POA: Diagnosis not present

## 2024-05-15 DIAGNOSIS — Z8673 Personal history of transient ischemic attack (TIA), and cerebral infarction without residual deficits: Secondary | ICD-10-CM

## 2024-05-15 DIAGNOSIS — E876 Hypokalemia: Secondary | ICD-10-CM | POA: Diagnosis present

## 2024-05-15 DIAGNOSIS — F1010 Alcohol abuse, uncomplicated: Secondary | ICD-10-CM | POA: Diagnosis present

## 2024-05-15 DIAGNOSIS — Z888 Allergy status to other drugs, medicaments and biological substances status: Secondary | ICD-10-CM

## 2024-05-15 DIAGNOSIS — I480 Paroxysmal atrial fibrillation: Secondary | ICD-10-CM | POA: Diagnosis present

## 2024-05-15 DIAGNOSIS — I4892 Unspecified atrial flutter: Secondary | ICD-10-CM | POA: Diagnosis present

## 2024-05-15 DIAGNOSIS — R29898 Other symptoms and signs involving the musculoskeletal system: Secondary | ICD-10-CM

## 2024-05-15 DIAGNOSIS — R001 Bradycardia, unspecified: Secondary | ICD-10-CM

## 2024-05-15 DIAGNOSIS — E785 Hyperlipidemia, unspecified: Secondary | ICD-10-CM | POA: Diagnosis present

## 2024-05-15 DIAGNOSIS — I509 Heart failure, unspecified: Principal | ICD-10-CM

## 2024-05-15 DIAGNOSIS — I5033 Acute on chronic diastolic (congestive) heart failure: Secondary | ICD-10-CM | POA: Diagnosis present

## 2024-05-15 DIAGNOSIS — Z1152 Encounter for screening for COVID-19: Secondary | ICD-10-CM

## 2024-05-15 DIAGNOSIS — Z886 Allergy status to analgesic agent status: Secondary | ICD-10-CM

## 2024-05-15 DIAGNOSIS — Z7901 Long term (current) use of anticoagulants: Secondary | ICD-10-CM | POA: Diagnosis not present

## 2024-05-15 DIAGNOSIS — G4733 Obstructive sleep apnea (adult) (pediatric): Secondary | ICD-10-CM | POA: Diagnosis present

## 2024-05-15 DIAGNOSIS — Z8249 Family history of ischemic heart disease and other diseases of the circulatory system: Secondary | ICD-10-CM

## 2024-05-15 DIAGNOSIS — I110 Hypertensive heart disease with heart failure: Secondary | ICD-10-CM | POA: Diagnosis present

## 2024-05-15 DIAGNOSIS — Z91040 Latex allergy status: Secondary | ICD-10-CM

## 2024-05-15 DIAGNOSIS — K219 Gastro-esophageal reflux disease without esophagitis: Secondary | ICD-10-CM | POA: Diagnosis present

## 2024-05-15 DIAGNOSIS — N179 Acute kidney failure, unspecified: Secondary | ICD-10-CM | POA: Diagnosis present

## 2024-05-15 DIAGNOSIS — Z880 Allergy status to penicillin: Secondary | ICD-10-CM

## 2024-05-15 DIAGNOSIS — Z860100 Personal history of colon polyps, unspecified: Secondary | ICD-10-CM

## 2024-05-15 DIAGNOSIS — I350 Nonrheumatic aortic (valve) stenosis: Secondary | ICD-10-CM | POA: Diagnosis present

## 2024-05-15 DIAGNOSIS — R0609 Other forms of dyspnea: Secondary | ICD-10-CM | POA: Diagnosis not present

## 2024-05-15 DIAGNOSIS — I447 Left bundle-branch block, unspecified: Secondary | ICD-10-CM | POA: Diagnosis present

## 2024-05-15 LAB — CBC WITH DIFFERENTIAL/PLATELET
Abs Immature Granulocytes: 0.03 K/uL (ref 0.00–0.07)
Basophils Absolute: 0.1 K/uL (ref 0.0–0.1)
Basophils Relative: 1 %
Eosinophils Absolute: 0.3 K/uL (ref 0.0–0.5)
Eosinophils Relative: 3 %
HCT: 28.5 % — ABNORMAL LOW (ref 39.0–52.0)
Hemoglobin: 8.6 g/dL — ABNORMAL LOW (ref 13.0–17.0)
Immature Granulocytes: 0 %
Lymphocytes Relative: 9 %
Lymphs Abs: 0.9 K/uL (ref 0.7–4.0)
MCH: 29.6 pg (ref 26.0–34.0)
MCHC: 30.2 g/dL (ref 30.0–36.0)
MCV: 97.9 fL (ref 80.0–100.0)
Monocytes Absolute: 1 K/uL (ref 0.1–1.0)
Monocytes Relative: 10 %
Neutro Abs: 7.6 K/uL (ref 1.7–7.7)
Neutrophils Relative %: 77 %
Platelets: 203 K/uL (ref 150–400)
RBC: 2.91 MIL/uL — ABNORMAL LOW (ref 4.22–5.81)
RDW: 16.9 % — ABNORMAL HIGH (ref 11.5–15.5)
WBC: 9.8 K/uL (ref 4.0–10.5)
nRBC: 0 % (ref 0.0–0.2)

## 2024-05-15 LAB — COMPREHENSIVE METABOLIC PANEL WITH GFR
ALT: 17 U/L (ref 0–44)
AST: 15 U/L (ref 15–41)
Albumin: 3.6 g/dL (ref 3.5–5.0)
Alkaline Phosphatase: 99 U/L (ref 38–126)
Anion gap: 11 (ref 5–15)
BUN: 20 mg/dL (ref 8–23)
CO2: 22 mmol/L (ref 22–32)
Calcium: 8.3 mg/dL — ABNORMAL LOW (ref 8.9–10.3)
Chloride: 110 mmol/L (ref 98–111)
Creatinine, Ser: 1.19 mg/dL (ref 0.61–1.24)
GFR, Estimated: >60 mL/min
Glucose, Bld: 95 mg/dL (ref 70–99)
Potassium: 3.4 mmol/L — ABNORMAL LOW (ref 3.5–5.1)
Sodium: 143 mmol/L (ref 135–145)
Total Bilirubin: 1.3 mg/dL — ABNORMAL HIGH (ref 0.0–1.2)
Total Protein: 6 g/dL — ABNORMAL LOW (ref 6.5–8.1)

## 2024-05-15 LAB — TROPONIN T, HIGH SENSITIVITY
Troponin T High Sensitivity: 43 ng/L — ABNORMAL HIGH (ref 0–19)
Troponin T High Sensitivity: 44 ng/L — ABNORMAL HIGH (ref 0–19)

## 2024-05-15 LAB — RESP PANEL BY RT-PCR (RSV, FLU A&B, COVID)  RVPGX2
Influenza A by PCR: NEGATIVE
Influenza B by PCR: NEGATIVE
Resp Syncytial Virus by PCR: NEGATIVE
SARS Coronavirus 2 by RT PCR: NEGATIVE

## 2024-05-15 LAB — PRO BRAIN NATRIURETIC PEPTIDE: Pro Brain Natriuretic Peptide: 3810 pg/mL — ABNORMAL HIGH

## 2024-05-15 MED ORDER — ONDANSETRON HCL 4 MG/2ML IJ SOLN: 4.0000 mg | Freq: Four times a day (QID) | INTRAMUSCULAR | Status: DC | PRN

## 2024-05-15 MED ORDER — ACETAMINOPHEN 650 MG RE SUPP: 650.0000 mg | Freq: Four times a day (QID) | RECTAL | Status: DC | PRN

## 2024-05-15 MED ORDER — FUROSEMIDE 10 MG/ML IJ SOLN
60.0000 mg | Freq: Once | INTRAMUSCULAR | Status: AC
  Administered 2024-05-15: 60 mg via INTRAVENOUS
  Filled 2024-05-15: qty 6

## 2024-05-15 MED ORDER — ONDANSETRON HCL 4 MG PO TABS: 4.0000 mg | ORAL_TABLET | Freq: Four times a day (QID) | ORAL | Status: DC | PRN

## 2024-05-15 MED ORDER — BISACODYL 5 MG PO TBEC: 5.0000 mg | DELAYED_RELEASE_TABLET | Freq: Every day | ORAL | Status: DC | PRN

## 2024-05-15 MED ORDER — RIVAROXABAN 20 MG PO TABS
20.0000 mg | ORAL_TABLET | Freq: Every day | ORAL | Status: DC
  Administered 2024-05-15 – 2024-05-17 (×3): 20 mg via ORAL
  Filled 2024-05-15: qty 2
  Filled 2024-05-15: qty 1
  Filled 2024-05-15: qty 2

## 2024-05-15 MED ORDER — SENNOSIDES-DOCUSATE SODIUM 8.6-50 MG PO TABS: 1.0000 | ORAL_TABLET | Freq: Every evening | ORAL | Status: DC | PRN

## 2024-05-15 MED ORDER — ACETAMINOPHEN 325 MG PO TABS: 650.0000 mg | ORAL_TABLET | Freq: Four times a day (QID) | ORAL | Status: DC | PRN

## 2024-05-15 MED ORDER — FUROSEMIDE 10 MG/ML IJ SOLN: 40.0000 mg | Freq: Once | INTRAMUSCULAR | Status: DC

## 2024-05-15 MED ORDER — POTASSIUM CHLORIDE CRYS ER 20 MEQ PO TBCR
40.0000 meq | EXTENDED_RELEASE_TABLET | Freq: Once | ORAL | Status: AC
  Administered 2024-05-15: 40 meq via ORAL
  Filled 2024-05-15: qty 2

## 2024-05-15 NOTE — ED Provider Notes (Signed)
 " [place] [facility] Provider Note   CSN: [id number] Arrival date & time: 05/15/24  1446     Patient presents with: Shortness of Breath   [name] [name] is a 74 y.o. male with history of A-fib on Xarelto , CHF, cirrhosis with varices, hypertension, hyperlipidemia presents with shortness of breath over the past week with associated productive cough and lower extremity swelling.  Denies any chest pain.  No sore throat, nasal congestion or fevers.  No abdominal pain.  No recent sick contacts.  Reports compliance with his Xarelto .  Followed up with his cardiologist yesterday.  Had discussions about starting a diuretic at that time.  They have been monitoring his bradycardia for some time now.  Felt to be secondary to current heart failure regiment.    Shortness of Breath  Past Medical History:  Diagnosis Date   Alcohol  abuse    Anemia    Arthritis    CHF (congestive heart failure) (HCC)    Cirrhosis of liver (HCC)    Cirrhosis, alcoholic (HCC)    last etoh 3/13-sees dr abran   Colon polyps    Esophageal varices (HCC)    GERD (gastroesophageal reflux disease)    H/O sleep apnea    Hyperlipidemia    Hypertension    Sleep apnea    had surgery to correct snoring 2001   Vocal cord paralysis    left   Past Surgical History:  Procedure Laterality Date   APPENDECTOMY     COLONOSCOPY  2013   HERNIA REPAIR     INSERTION OF MESH  04/21/2012   Procedure: INSERTION OF MESH;  Surgeon: Vicenta DELENA Poli, MD;  Location: [facility];  Service: General;  Laterality: N/A;   INTRAMEDULLARY (IM) NAIL INTERTROCHANTERIC Right 03/06/2024   Procedure: FIXATION, FRACTURE, INTERTROCHANTERIC, WITH INTRAMEDULLARY ROD;  Surgeon: Georgina Ozell DELENA, MD;  Location: [facility] OR;  Service: Orthopedics;  Laterality: Right;   MOUTH SURGERY  7/13   ORIF ANKLE FRACTURE Left 02/12/2014   Procedure: OPEN REDUCTION INTERNAL FIXATION (ORIF) LEFT ANKLE FRACTURE ;  Surgeon:  Norleen Armor, MD;  Location: [facility] OR;  Service: Orthopedics;  Laterality: Left;   PERCUTANEOUS PINNING Left 02/12/2014   Procedure: Closed Reduction  PERCUTANEOUS PINNING left great toe;  Surgeon: Norleen Armor, MD;  Location[facility] OR;  Service: Orthopedics;  Laterality: Left;   ROTATOR CUFF REPAIR Bilateral    SHOULDER OPEN ROTATOR CUFF REPAIR     2 on right shoulder, 1 on left shoulder   THROAT SURGERY     surgery to help with snoring   UMBILICAL HERNIA REPAIR  04/21/2012   Procedure: HERNIA REPAIR UMBILICAL ADULT;  Surgeon: Vicenta DELENA Poli, MD;  Location: [facility];  Service: General;  Laterality: N/A;  umbilical hernia repair with mesh   UMBILICAL HERNIA REPAIR     UPPER GI ENDOSCOPY  2013   UVULOPALATOPHARYNGOPLASTY  2001   UVULOPALATOPHARYNGOPLASTY         Prior to Admission medications  Medication Sig Start Date End Date Taking? Authorizing Provider  acetaminophen  (TYLENOL ) 325 MG tablet Take 1-2 tablets (325-650 mg total) by mouth every 6 (six) hours as needed for mild pain. 03/15/14   Love, Sharlet RAMAN, PA-C  amiodarone  (PACERONE ) 200 MG tablet Take 1/2 tablets (100 mg total) by mouth daily. 05/14/24 08/12/24  Santo Stanly DELENA, MD  atorvastatin  (LIPITOR) 40 MG tablet Take 1 tablet (40 mg total) by mouth daily. 05/14/24   Santo Stanly  A, MD  ezetimibe  (ZETIA ) 10 MG tablet Take 1 tablet (10 mg total) by mouth daily. 05/14/24   Chandrasekhar, Stanly LABOR, MD  ferrous sulfate  325 (65 FE) MG EC tablet Take 325 mg by mouth every other day.    [provider]  furosemide  (LASIX ) 40 MG tablet Take by mouth as needed for fluid.    [provider]  hydrALAZINE  (APRESOLINE ) 100 MG tablet Take 1 tablet (100 mg total) by mouth 2 (two) times daily. 05/14/24   Santo Stanly LABOR, MD  isosorbide  dinitrate (ISORDIL ) 30 MG tablet Take 1 tablet (30 mg total) by mouth 2 (two) times daily. 05/14/24 06/13/24  Santo Stanly LABOR, MD  losartan  (COZAAR ) 25 MG  tablet Take 1 tablet (25 mg total) by mouth daily. 05/14/24   Santo Stanly LABOR, MD  nebivolol  (BYSTOLIC ) 10 MG tablet Take 2 tablets (20 mg total) by mouth daily. 05/14/24   Santo Stanly A, MD  polyethylene glycol (MIRALAX  / GLYCOLAX ) 17 g packet Take 17 g by mouth daily. Patient taking differently: Take 17 g by mouth as needed for moderate constipation. 03/15/24   de Clint Kill, Cortney E, NP  rivaroxaban  (XARELTO ) 20 MG TABS tablet Take 1 tablet (20 mg total) by mouth daily with supper. 05/14/24 06/13/24  Santo Stanly LABOR, MD    Allergies: Adhesive [tape]; Amoxicillin; Calan sr [verapamil]; Cardura [doxazosin mesylate]; Celebrex [celecoxib]; Cephalosporins; Clindamycin/lincomycin; Dexon [dexamethasone  sodium phosphate ]; Duraprep [antiseptic products, misc.]; Ery-tab [erythromycin]; Hctz [hydrochlorothiazide ]; Iodine; Keflex [cephalexin]; Lipitor [atorvastatin ]; Norvasc  [amlodipine ]; Tenormin  [atenolol ]; Toprol  xl [metoprolol  succinate]; Vasotec [enalapril]; Vioxx [rofecoxib]; Zocor [simvastatin]; and Latex    Review of Systems  Respiratory:  Positive for shortness of breath.     Updated Vital Signs BP (!) 189/65   Pulse (!) 44   Temp (!) 97.4 F (36.3 C)   Resp (!) 21   SpO2 98%   Physical Exam Vitals and nursing note reviewed.  Constitutional:      General: He is not in acute distress.    Appearance: He is well-developed.  HENT:     Head: Normocephalic and atraumatic.  Eyes:     Conjunctiva/sclera: Conjunctivae normal.  Cardiovascular:     Rate and Rhythm: Normal rate and regular rhythm.     Heart sounds: No murmur heard. Pulmonary:     Effort: Pulmonary effort is normal. No respiratory distress.     Breath sounds: Normal breath sounds.  Abdominal:     Palpations: Abdomen is soft.     Tenderness: There is no abdominal tenderness.  Musculoskeletal:        General: No swelling.     Cervical back: Neck supple.  Skin:    General: Skin is warm and dry.      Capillary Refill: Capillary refill takes less than 2 seconds.  Neurological:     Mental Status: He is alert.  Psychiatric:        Mood and Affect: Mood normal.     (all labs ordered are listed, but only abnormal results are displayed) Labs Reviewed  CBC WITH DIFFERENTIAL/PLATELET - Abnormal; Notable for the following components:      Result Value   RBC 2.91 (*)    Hemoglobin 8.6 (*)    HCT 28.5 (*)    RDW 16.9 (*)    All other components within normal limits  PRO BRAIN NATRIURETIC PEPTIDE - Abnormal; Notable for the following components:   Pro Brain Natriuretic Peptide 3,810.0 (*)    All other components within normal limits  COMPREHENSIVE METABOLIC PANEL WITH GFR - Abnormal; Notable for the following components:   Potassium 3.4 (*)    Calcium  8.3 (*)    Total Protein 6.0 (*)    Total Bilirubin 1.3 (*)    All other components within normal limits  TROPONIN T, HIGH SENSITIVITY - Abnormal; Notable for the following components:   Troponin T High Sensitivity 43 (*)    All other components within normal limits  RESP PANEL BY RT-PCR (RSV, FLU A&B, COVID)  RVPGX2  TROPONIN T, HIGH SENSITIVITY    EKG: None  Radiology: DG Chest 2 View Result Date: 05/15/2024 CLINICAL DATA:  Shortness of breath EXAM: CHEST - 2 VIEW COMPARISON:  03/03/2024 FINDINGS: Mild cardiomegaly. Small bilateral pleural effusions. Vascular congestion and probable mild interstitial edema. Possible left retrocardiac airspace opacity. Multiple chronic appearing bilateral rib fractures. IMPRESSION: Cardiomegaly with vascular congestion and probable mild interstitial edema. Small bilateral pleural effusions. Possible left retrocardiac airspace disease. Electronically Signed   By: Luke Bun M.D.   On: 05/15/2024 17:02     Procedures   Medications Ordered in the ED  furosemide  (LASIX ) injection 60 mg (60 mg Intravenous Given 05/15/24 1827)    Clinical Course as of 05/15/24 1915  Tue May 15, 2024  1548 Patient  with history of CHF, cirrhosis, A-fib on Xarelto  and multiple other cardiovascular risk factors evaluated for shortness of breath with associated productive cough and bilateral lower extremity edema over the past week.  Upon arrival patient is hypertensive and bradycardic.  Bradycardia is at baseline.  On exam patient has bibasilar Rales with 2+ bilateral pitting edema.  EKG with sinus bradycardia.  Will begin broad workup. [JT]  1734 CBC with Differential(!) No leukocytosis, hemoglobin stable [JT]  1735 Comprehensive metabolic panel(!) No significant abnormality [JT]  1735 Resp panel by RT-PCR (RSV, Flu A&B, Covid) Anterior Nasal Swab Negative [JT]  1735 Pro Brain natriuretic peptide(!) Significant elevated to 3810 [JT]  1735 Troponin T, High Sensitivity(!) Elevated to 43, no active chest pain.  Suspect demand.  Will trend [JT]  1735 Workup overall consistent with CHF exacerbation.  Hospital team consulted for admission [JT]  1822 Discussed patient with Dr. Lou, agreed for admission [JT]    Clinical Course User Index [JT] Donnajean Lynwood DEL, PA-C                                 Medical Decision Making Amount and/or Complexity of Data Reviewed Labs: ordered. Radiology: ordered.  Risk Prescription drug management.   This patient presents to the ED with chief complaint(s) of Shortness of breath.  The complaint involves an extensive differential diagnosis and also carries with it a high risk of complications and morbidity.   Pertinent past medical history as listed in HPI  The differential diagnosis includes  Low suspicion for PE as patient is compliant with Xarelto . Additional history obtained: Additional history obtained from family Records reviewed Care Everywhere/External Records  Disposition:   Patient will be admitted for CHF exacerbation  Social Determinants of Health:   none  This note was dictated with voice recognition software.  Despite best efforts at  proofreading, errors may have occurred which can change the documentation meaning.       Final diagnoses:  Acute on chronic congestive heart failure, unspecified heart failure type [facility])    ED Discharge Orders     None          Rene Sizelove,  Lynwood DEL, PA-C 05/15/24 1915    Tegeler, Lonni PARAS, MD 05/15/24 2240  "

## 2024-05-15 NOTE — ED Triage Notes (Incomplete)
 Pt BIBA from home for SOB x 1 week - worse today. Edema in abd, BLE. Lung sounds clear per EMS. Not on O2 at home. Ems placed him on 2 lpm O2 for comfort.  12 Lead - SB - HR 50s 20 LAC 2 x NTGs

## 2024-05-15 NOTE — H&P (Incomplete)
 " History and Physical  [name] [name] FMW:[id number] DOB: [birth year] DOA: 05/15/2024  PCP: Loring Tanda Mae, MD   Chief Complaint: Shortness of breath, leg swelling  HPI: [name] [name] is a 74 y.o. male with medical history significant for HTN, aortic stenosis, bradycardia (baseline HR in the 40s), chronic HFpEF, CVA 03/03/24 s/p TNK attributed to new onset A-fib/flutter in November now on Xarelto , right femur fracture s/p ORIF, alcoholic cirrhosis, OSA and GERD who presented to the ED for evaluation of shortness of breath. Patient reports that over the last 4 to 5 days, he has had progressive shortness of breath and dyspnea on exertion. He has not been able to lay flat in bed to sleep so he has been sleeping on his recliner. He had a follow-up with his cardiologist yesterday and there were discussions about possibly starting him on Lasix  after lab work.  He reports associated leg swelling but denies any dizziness, syncope, palpitations, chest pain, nausea, vomiting, abdominal pain, fevers or chills.  ED Course: Initial vitals show patient afebrile, RR 11-26, HR 30s and 40s, SBP 150-180s, SpO2 100% on 2 L, wean to room air. Initial labs significant for proBNP 3810, K+ 3.4, troponin 43-44, Hgb 8.6, normal renal function, negative flu/RSV/COVID test. EKG shows sinus bradycardia with atypical LBBB. CXR shows cardiomegaly with vascular congestion and mild interstitial edema. Pt received IV Lasix  60 mg x 1. TRH was consulted for admission.   Review of Systems: Please see HPI for pertinent positives and negatives. A complete 10 system review of systems are otherwise negative.  Past Medical History:  Diagnosis Date   Alcohol  abuse    Anemia    Arthritis    CHF (congestive heart failure) (HCC)    Cirrhosis of liver (HCC)    Cirrhosis, alcoholic (HCC)    last etoh 3/13-sees dr abran   Colon polyps    Esophageal varices (HCC)    GERD (gastroesophageal reflux disease)    H/O sleep apnea     Hyperlipidemia    Hypertension    Sleep apnea    had surgery to correct snoring 2001   Vocal cord paralysis    left   Past Surgical History:  Procedure Laterality Date   APPENDECTOMY     COLONOSCOPY  2013   HERNIA REPAIR     INSERTION OF MESH  04/21/2012   Procedure: INSERTION OF MESH;  Surgeon: Vicenta DELENA Poli, MD;  Location: [facility];  Service: General;  Laterality: N/A;   INTRAMEDULLARY (IM) NAIL INTERTROCHANTERIC Right 03/06/2024   Procedure: FIXATION, FRACTURE, INTERTROCHANTERIC, WITH INTRAMEDULLARY ROD;  Surgeon: Georgina Ozell DELENA, MD;  Location: [facility] OR;  Service: Orthopedics;  Laterality: Right;   MOUTH SURGERY  7/13   ORIF ANKLE FRACTURE Left 02/12/2014   Procedure: OPEN REDUCTION INTERNAL FIXATION (ORIF) LEFT ANKLE FRACTURE ;  Surgeon: Norleen Armor, MD;  Location: [facility] OR;  Service: Orthopedics;  Laterality: Left;   PERCUTANEOUS PINNING Left 02/12/2014   Procedure: Closed Reduction  PERCUTANEOUS PINNING left great toe;  Surgeon: Norleen Armor, MD;  Location: [facility] OR;  Service: Orthopedics;  Laterality: Left;   ROTATOR CUFF REPAIR Bilateral    SHOULDER OPEN ROTATOR CUFF REPAIR     2 on right shoulder, 1 on left shoulder   THROAT SURGERY     surgery to help with snoring   UMBILICAL HERNIA REPAIR  04/21/2012   Procedure: HERNIA REPAIR UMBILICAL ADULT;  Surgeon: Vicenta DELENA Poli, MD;  Location: [facility];  Service: General;  Laterality: N/A;  umbilical hernia repair with mesh   UMBILICAL HERNIA REPAIR     UPPER GI ENDOSCOPY  2013   UVULOPALATOPHARYNGOPLASTY  2001   UVULOPALATOPHARYNGOPLASTY     Social History:  reports that he has never smoked. He has never used smokeless tobacco. He reports current drug use. Drug: Marijuana. He reports that he does not drink alcohol .  Allergies[1]  Family History  Problem Relation Age of Onset   Diabetes Mother    Congestive Heart Failure Mother    Heart disease Father        first MI at age 9, stents, CABG    Diabetes Father    Atrial fibrillation Brother    CAD Brother        MI and stent age 45   Hypertension Brother    Colon cancer Neg Hx    Stomach cancer Neg Hx    Rectal cancer Neg Hx      Prior to Admission medications  Medication Sig Start Date End Date Taking? Authorizing Provider  acetaminophen  (TYLENOL ) 325 MG tablet Take 1-2 tablets (325-650 mg total) by mouth every 6 (six) hours as needed for mild pain. 03/15/14   Love, Sharlet RAMAN, PA-C  amiodarone  (PACERONE ) 200 MG tablet Take 1/2 tablets (100 mg total) by mouth daily. 05/14/24 08/12/24  Santo Stanly LABOR, MD  atorvastatin  (LIPITOR) 40 MG tablet Take 1 tablet (40 mg total) by mouth daily. 05/14/24   Santo Stanly LABOR, MD  ezetimibe  (ZETIA ) 10 MG tablet Take 1 tablet (10 mg total) by mouth daily. 05/14/24   Chandrasekhar, Stanly LABOR, MD  ferrous sulfate  325 (65 FE) MG EC tablet Take 325 mg by mouth every other day.    [provider]  furosemide  (LASIX ) 40 MG tablet Take by mouth as needed for fluid.    [provider]  hydrALAZINE  (APRESOLINE ) 100 MG tablet Take 1 tablet (100 mg total) by mouth 2 (two) times daily. 05/14/24   Santo Stanly LABOR, MD  isosorbide  dinitrate (ISORDIL ) 30 MG tablet Take 1 tablet (30 mg total) by mouth 2 (two) times daily. 05/14/24 06/13/24  Santo Stanly LABOR, MD  losartan  (COZAAR ) 25 MG tablet Take 1 tablet (25 mg total) by mouth daily. 05/14/24   Chandrasekhar, Stanly LABOR, MD  nebivolol  (BYSTOLIC ) 10 MG tablet Take 2 tablets (20 mg total) by mouth daily. 05/14/24   Santo Stanly A, MD  polyethylene glycol (MIRALAX  / GLYCOLAX ) 17 g packet Take 17 g by mouth daily. Patient taking differently: Take 17 g by mouth as needed for moderate constipation. 03/15/24   de Clint Kill, Cortney E, NP  rivaroxaban  (XARELTO ) 20 MG TABS tablet Take 1 tablet (20 mg total) by mouth daily with supper. 05/14/24 06/13/24  Santo Stanly LABOR, MD    Physical Exam: BP (!) 163/50   Pulse (!)  37   Temp 97.6 F (36.4 C) (Oral)   Resp 20   SpO2 92%  General: Pleasant, chronically ill elderly man laying in bed. No acute distress. HEENT: [place]/AT. Anicteric sclera CV: Bradycardia. Regular rhythm. No murmurs, rubs, or gallops. Pulmonary: Lungs CTAB. Normal effort. Bibasilar rales. Abdominal: Soft, mild distention. Nontender. Mild abdominal wall edema. Normal bowel sounds. Extremities: 1-2+ pitting edema. Palpable radial and DP pulses. Normal ROM. Skin: Warm and dry. No obvious rash or lesions. Neuro: A&Ox3. Moves all extremities. Normal sensation to light touch. No focal deficit. Psych: Normal mood and affect          Labs on Admission:  Basic  Metabolic Panel: Recent Labs  Lab 05/15/24 1617  NA 143  K 3.4*  CL 110  CO2 22  GLUCOSE 95  BUN 20  CREATININE 1.19  CALCIUM  8.3*   Liver Function Tests: Recent Labs  Lab 05/15/24 1617  AST 15  ALT 17  ALKPHOS 99  BILITOT 1.3*  PROT 6.0*  ALBUMIN  3.6   No results for input(s): LIPASE, AMYLASE in the last 168 hours. No results for input(s): AMMONIA in the last 168 hours. CBC: Recent Labs  Lab 05/15/24 1617  WBC 9.8  NEUTROABS 7.6  HGB 8.6*  HCT 28.5*  MCV 97.9  PLT 203   Cardiac Enzymes: No results for input(s): CKTOTAL, CKMB, CKMBINDEX, TROPONINI in the last 168 hours. BNP (last 3 results) No results for input(s): BNP in the last 8760 hours.  ProBNP (last 3 results) Recent Labs    05/15/24 1617  PROBNP 3,810.0*    CBG: No results for input(s): GLUCAP in the last 168 hours.  Radiological Exams on Admission: DG Chest 2 View Result Date: 05/15/2024 CLINICAL DATA:  Shortness of breath EXAM: CHEST - 2 VIEW COMPARISON:  03/03/2024 FINDINGS: Mild cardiomegaly. Small bilateral pleural effusions. Vascular congestion and probable mild interstitial edema. Possible left retrocardiac airspace opacity. Multiple chronic appearing bilateral rib fractures. IMPRESSION: Cardiomegaly with vascular  congestion and probable mild interstitial edema. Small bilateral pleural effusions. Possible left retrocardiac airspace disease. Electronically Signed   By: Luke Bun M.D.   On: 05/15/2024 17:02   Assessment/Plan [name] [name] is a 74 y.o. male with medical history significant for HTN, aortic stenosis, bradycardia (baseline HR in the 40s), chronic HFpEF, CVA 03/03/24 s/p TNK attributed to new onset A-fib/flutter in November now on Xarelto , right femur fracture s/p ORIF, alcoholic cirrhosis, OSA and GERD who presented to the ED for evaluation of shortness of breath and admitted for CHF exacerbation.  # Acute on chronic diastolic HF - Last TTE on 03/2024 shows EF >75%, mild LVH, G1DD, but no valvular abnormalities - Pt presented with progressive shortness of breath, dyspnea on exertion and leg swelling - Pt with clinical, radiological and laboratory signs of CHF exacerbation - Start IV lasix  40 mg twice daily - Follow up repeat echocardiogram to assess for change in systolic function - Strict I&O, daily weights - Maintain K+ > 4.0, Mag > 2.0 - Telemetry  # Bradycardia - This seems to be a chronic problem, followed by his cardiologist - HR remains in the 30s to 50s, not significantly different from baseline, he remains asymptomatic - Follow-up echocardiogram - Hold nodal blocking agents - Check TSH and magnesium levels - Telemetry  # Hypertensive urgency - BP elevated with SBP in the 150-180s and DBP 50-60s, widening of the pulse pressure likely due to aortic regurg versus anemia - Patient unable to confirm his meds according to pharmacy tech, will follow-up in the morning with aide - IV hydralazine  as needed for SBP > 180  # Paroxysmal A-fib - EKG shows bradycardia with atypical LBBB - Resume Xarelto  but hold beta-blocker - Telemetry  # Anemia - Hgb of 8.6, around baseline of 8-9 - Trend CBC, transfuse for Hgb > 7  # Hx of alcoholic cirrhosis - Chronic and stable, normal  LFTs - CTM   Chronic medical problems # HLD # HTN # GERD # Hx of CVA - Resume meds after completion of med rec during the day  DVT prophylaxis: Xarelto     Code Status: Full Code  Consults called: None  Family  Communication: No family at bedside  Severity of Illness: The appropriate patient status for this patient is INPATIENT. Inpatient status is judged to be reasonable and necessary in order to provide the required intensity of service to ensure the patient's safety. The patient's presenting symptoms, physical exam findings, and initial radiographic and laboratory data in the context of their chronic comorbidities is felt to place them at high risk for further clinical deterioration. Furthermore, it is not anticipated that the patient will be medically stable for discharge from the hospital within 2 midnights of admission.   * I certify that at the point of admission it is my clinical judgment that the patient will require inpatient hospital care spanning beyond 2 midnights from the point of admission due to high intensity of service, high risk for further deterioration and high frequency of surveillance required.*  Level of care: Telemetry    [name] Claretta HERO, MD 05/16/2024, 12:59 AM [facility] Pager: [phone] Isaiah 41:10   If 7PM-7AM, please contact night-coverage [email or web address] Password [email or web address]     [1]  Allergies Allergen Reactions   Adhesive [Tape] Dermatitis and Other (See Comments)    Skin blisters after tape removal  OK with using paper tape   Amoxicillin Hives   Calan Sr [Verapamil] Cough   Cardura [Doxazosin Mesylate] Other (See Comments)    Unknown reaction   Celebrex [Celecoxib] Nausea And Vomiting   Cephalosporins Hives   Clindamycin/Lincomycin Hives   Dexon [Dexamethasone  Sodium Phosphate ] Other (See Comments)    Unknown reaction   Duraprep [Antiseptic Products, Misc.] Dermatitis and Other (See Comments)    Localized skin blisters around  application site when applied topically   Ery-Tab [Erythromycin] Hives   Hctz [Hydrochlorothiazide ] Other (See Comments)    Photosensitivity  Burning sensation    Iodine Dermatitis and Other (See Comments)    Localized skin blisters around application site when applied topically   Keflex [Cephalexin] Diarrhea and Nausea And Vomiting    body rejects it   Lipitor [Atorvastatin ]    Norvasc  [Amlodipine ] Other (See Comments)    Unknown reaction   Tenormin  [Atenolol ] Other (See Comments)    Unknown reaction   Toprol  Xl [Metoprolol  Succinate] Other (See Comments)    Unknown reaction   Vasotec [Enalapril] Cough   Vioxx [Rofecoxib] Hives and Nausea And Vomiting   Zocor [Simvastatin] Nausea And Vomiting   Latex Rash   "

## 2024-05-16 ENCOUNTER — Inpatient Hospital Stay (HOSPITAL_COMMUNITY)

## 2024-05-16 DIAGNOSIS — R001 Bradycardia, unspecified: Secondary | ICD-10-CM

## 2024-05-16 DIAGNOSIS — I5033 Acute on chronic diastolic (congestive) heart failure: Secondary | ICD-10-CM

## 2024-05-16 DIAGNOSIS — R0609 Other forms of dyspnea: Secondary | ICD-10-CM | POA: Diagnosis not present

## 2024-05-16 LAB — CBC
HCT: 29.2 % — ABNORMAL LOW (ref 39.0–52.0)
Hemoglobin: 9.2 g/dL — ABNORMAL LOW (ref 13.0–17.0)
MCH: 30.5 pg (ref 26.0–34.0)
MCHC: 31.5 g/dL (ref 30.0–36.0)
MCV: 96.7 fL (ref 80.0–100.0)
Platelets: 209 K/uL (ref 150–400)
RBC: 3.02 MIL/uL — ABNORMAL LOW (ref 4.22–5.81)
RDW: 17 % — ABNORMAL HIGH (ref 11.5–15.5)
WBC: 8.6 K/uL (ref 4.0–10.5)
nRBC: 0 % (ref 0.0–0.2)

## 2024-05-16 LAB — BASIC METABOLIC PANEL WITH GFR
Anion gap: 10 (ref 5–15)
BUN: 21 mg/dL (ref 8–23)
CO2: 24 mmol/L (ref 22–32)
Calcium: 8.5 mg/dL — ABNORMAL LOW (ref 8.9–10.3)
Chloride: 108 mmol/L (ref 98–111)
Creatinine, Ser: 1.28 mg/dL — ABNORMAL HIGH (ref 0.61–1.24)
GFR, Estimated: 59 mL/min — ABNORMAL LOW
Glucose, Bld: 117 mg/dL — ABNORMAL HIGH (ref 70–99)
Potassium: 3.8 mmol/L (ref 3.5–5.1)
Sodium: 141 mmol/L (ref 135–145)

## 2024-05-16 LAB — ECHOCARDIOGRAM COMPLETE
AR max vel: 1.35 cm2
AV Area VTI: 1.46 cm2
AV Area mean vel: 1.36 cm2
AV Mean grad: 19.7 mmHg
AV Peak grad: 36.5 mmHg
Ao pk vel: 3.02 m/s
Area-P 1/2: 3.17 cm2
S' Lateral: 3.5 cm

## 2024-05-16 MED ORDER — EZETIMIBE 10 MG PO TABS
10.0000 mg | ORAL_TABLET | Freq: Every day | ORAL | Status: DC
  Administered 2024-05-16 – 2024-05-18 (×3): 10 mg via ORAL
  Filled 2024-05-16 (×3): qty 1

## 2024-05-16 MED ORDER — AMIODARONE HCL 200 MG PO TABS
100.0000 mg | ORAL_TABLET | Freq: Every day | ORAL | Status: DC
  Administered 2024-05-16 – 2024-05-18 (×3): 100 mg via ORAL
  Filled 2024-05-16 (×3): qty 1

## 2024-05-16 MED ORDER — FERROUS SULFATE 325 (65 FE) MG PO TABS
325.0000 mg | ORAL_TABLET | ORAL | Status: DC
  Administered 2024-05-17: 325 mg via ORAL
  Filled 2024-05-16 (×2): qty 1

## 2024-05-16 MED ORDER — ISOSORBIDE DINITRATE 30 MG PO TABS
30.0000 mg | ORAL_TABLET | Freq: Two times a day (BID) | ORAL | Status: DC
  Administered 2024-05-16 – 2024-05-18 (×5): 30 mg via ORAL
  Filled 2024-05-16 (×4): qty 1
  Filled 2024-05-16: qty 3

## 2024-05-16 MED ORDER — HYDRALAZINE HCL 50 MG PO TABS
100.0000 mg | ORAL_TABLET | Freq: Two times a day (BID) | ORAL | Status: DC
  Administered 2024-05-16 – 2024-05-18 (×5): 100 mg via ORAL
  Filled 2024-05-16 (×5): qty 2

## 2024-05-16 MED ORDER — FUROSEMIDE 10 MG/ML IJ SOLN
40.0000 mg | Freq: Every day | INTRAMUSCULAR | Status: DC
  Administered 2024-05-16: 40 mg via INTRAVENOUS
  Filled 2024-05-16: qty 4

## 2024-05-16 MED ORDER — PERFLUTREN LIPID MICROSPHERE
1.0000 mL | INTRAVENOUS | Status: AC | PRN
  Administered 2024-05-16: 3 mL via INTRAVENOUS

## 2024-05-16 MED ORDER — NEBIVOLOL HCL 10 MG PO TABS: 20.0000 mg | ORAL_TABLET | Freq: Every day | ORAL | Status: DC

## 2024-05-16 MED ORDER — HYDRALAZINE HCL 20 MG/ML IJ SOLN: 10.0000 mg | Freq: Four times a day (QID) | INTRAMUSCULAR | Status: DC | PRN

## 2024-05-16 MED ORDER — ATORVASTATIN CALCIUM 40 MG PO TABS
40.0000 mg | ORAL_TABLET | Freq: Every evening | ORAL | Status: DC
  Administered 2024-05-16 – 2024-05-17 (×2): 40 mg via ORAL
  Filled 2024-05-16 (×2): qty 1

## 2024-05-16 NOTE — Hospital Course (Addendum)
 [name] [name] is a 74 y.o. male with PMH of HTN Aortic stenosis, bradycardia (baseline HR in the 40s), chronic HFpEF, CVA 03/03/24 s/p TNK attributed to new onset A-fib/flutter in November now on Xarelto , right femur fracture s/p ORIF, alcoholic cirrhosis, OSA and GERD who presented to the ED for evaluation of shortness of breath. Patient reports that over the last 4 to 5 days, he has had progressive shortness of breath and dyspnea on exertion. He has not been able to lay flat in bed to sleep so he has been sleeping on his recliner. He had a follow-up with his cardiologist yesterday and there were discussions about possibly starting him on Lasix  after lab work.  He reports associated leg swelling but denies any dizziness, syncope, palpitations, chest pain, nausea, vomiting, abdominal pain, fevers or chills.  ED Course: Initial vitals show patient afebrile, RR 11-26, HR 30s and 40s, SBP 150-180s, SpO2 100% on 2 L, wean to room air. Initial labs significant for proBNP 3810, K+ 3.4, troponin 43-44, Hgb 8.6, normal renal function, negative flu/RSV/COVID test. EKG shows sinus bradycardia with atypical LBBB. CXR shows cardiomegaly with vascular congestion and mild interstitial edema. Pt received IV Lasix  60 mg x 1. TRH was consulted for admission.   Subjective: Seen and examined Feels much better overall, patient Sister at the bedside Denies nausea vomiting.  Able to lay flat Overnight  afebrile, VSS, although intermittent bradycardia, BP stable, on room air labs  creat 1.1> 1.2> 1.5, lasix  held  Assessment and plan:  Acute on chronic diastolic HF: Pt presented with progressive shortness of breath, dyspnea on exertion and leg swelling Last TTE 03/2024 EF >75%, mild LVH, G1DD, but no valvular abnormalities-repeat echo shows EF 60-65% G2 DD no RWMA MV normal aortic valve calcified RV systolic function normal.  , with IV Lasix  overall much improved. With creatinine trending up hold further IV Lasix ,, monitor  daily weight intake output Net IO Since Admission: -1,880 mL [05/17/24 1257]  Filed Weights   05/16/24 2100 05/17/24 0442  Weight: 88.9 kg 88.5 kg    Recent Labs  Lab 05/15/24 1617 05/16/24 0245 05/17/24 0445  PROBNP 3,810.0*  --   --   BUN 20 21 29*  CREATININE 1.19 1.28* 1.59*  K 3.4* 3.8 3.5  MG  --   --  1.8   Mild AKI: Creatinine trending up 1.5 in the setting of diuresis holding diuretics encourage p.o. monitor renal function closely, previously baseline creatinine has been up to 1.9 Recent Labs    03/14/24 0111 04/15/24 1100 04/15/24 1622 04/16/24 0521 04/17/24 0553 04/18/24 0352 04/19/24 0226 04/20/24 0322 05/15/24 1617 05/16/24 0245 05/17/24 0445  BUN 26* 57*  --  47* 33* 24* 23 21 20 21  29*  CREATININE 1.19 2.67* 2.30* 1.91* 1.21 0.90 1.09 1.03 1.19 1.28* 1.59*  CO2 21* 21*  --  23 20* 18* 20* 18* 22 24 24   K 4.5 4.7  --  4.6 4.4 4.1 3.7 3.7 3.4* 3.8 3.5    Bradycardia Likely chronic, asymptomatic TSH stable 2.2.  Holding beta-blocker. He is followed by his cardiologist   Hypertensive urgency: SBP in the 150-180s and DBP 50-60s, widening of the pulse pressure likely due to aortic regurg versus anemia Fairly controlled on continue home hydralazine  Imdur . Hold losartan  and bystolic .   Paroxysmal A-fib Cont Xarelto  amiodarone , but hold beta-blocker due to slow heart rate   Anemia Hgb of 8.6, around baseline of 8-9 Trend CBC, transfuse for Hgb > 7  Hx of alcoholic cirrhosis Chronic and stable, normal LFTs  History of CVA HLD: Continue Zetia  statin  GERD Continue PPI  Deconditioning/debility: Continue PT OT Mobility: PT Orders: Active PT Follow up Rec: Home Health Pt (Resume - Pt Active With Hh Prior To Adm)05/16/2024 1639    DVT prophylaxis:  Code Status:   Code Status: Full Code Family Communication: plan of care discussed with patient and his brother at bedside. Patient status is: Remains hospitalized because of severity of illness Level of  care: Telemetry   Dispo: The patient is from: hole alone uses walker            Anticipated disposition: home w/ HH pending impronement in creatinine Objective: Vitals last 24 hrs: Vitals:   05/16/24 2325 05/17/24 0442 05/17/24 0700 05/17/24 1233  BP: (!) 150/57 (!) 154/48 (!) 138/58 (!) 137/45  Pulse: (!) 50 (!) 46 (!) 44 61  Resp: 19 16 18 20   Temp: 98.6 F (37 C) 98.2 F (36.8 C) 98 F (36.7 C) 97.6 F (36.4 C)  TempSrc: Oral Oral Oral Oral  SpO2: 97% 93% 92% 96%  Weight:  88.5 kg      Physical Examination: General exam: AAOX3 HEENT:Oral mucosa moist, Ear/Nose WNL grossly Respiratory system: Bilaterally diminished BS,no use of accessory muscle Cardiovascular system: S1 & S2 +, No JVD. Gastrointestinal system: Abdomen soft,NT,ND, BS+ Nervous System: Alert, awake, moving all extremities,and following commands. Extremities: extremities warm, leg edema minimal  Skin: Warm, no rashes MSK: Normal muscle bulk,tone, power   Medications reviewed:  Scheduled Meds:  amiodarone   100 mg Oral Daily   atorvastatin   40 mg Oral QPM   ezetimibe   10 mg Oral Daily   ferrous sulfate   325 mg Oral QODAY   hydrALAZINE   100 mg Oral BID   isosorbide  dinitrate  30 mg Oral BID   rivaroxaban   20 mg Oral Q supper   Continuous Infusions: Diet: Diet Order             Diet Heart Room service appropriate? Yes; Fluid consistency: Thin  Diet effective now

## 2024-05-16 NOTE — ED Notes (Signed)
 PT at bedside.

## 2024-05-16 NOTE — Progress Notes (Signed)
 " PROGRESS NOTE [name] [name]  FMW:[id number] DOB: 06-27-1950 DOA: 05/15/2024 PCP: Loring Tanda Mae, MD  Brief Narrative/Hospital Course: [name] [name] is a 74 y.o. male with PMH of HTN Aortic stenosis, bradycardia (baseline HR in the 40s), chronic HFpEF, CVA 03/03/24 s/p TNK attributed to new onset A-fib/flutter in November now on Xarelto , right femur fracture s/p ORIF, alcoholic cirrhosis, OSA and GERD who presented to the ED for evaluation of shortness of breath. Patient reports that over the last 4 to 5 days, he has had progressive shortness of breath and dyspnea on exertion. He has not been able to lay flat in bed to sleep so he has been sleeping on his recliner. He had a follow-up with his cardiologist yesterday and there were discussions about possibly starting him on Lasix  after lab work.  He reports associated leg swelling but denies any dizziness, syncope, palpitations, chest pain, nausea, vomiting, abdominal pain, fevers or chills.   ED Course: Initial vitals show patient afebrile, RR 11-26, HR 30s and 40s, SBP 150-180s, SpO2 100% on 2 L, wean to room air. Initial labs significant for proBNP 3810, K+ 3.4, troponin 43-44, Hgb 8.6, normal renal function, negative flu/RSV/COVID test. EKG shows sinus bradycardia with atypical LBBB. CXR shows cardiomegaly with vascular congestion and mild interstitial edema. Pt received IV Lasix  60 mg x 1. TRH was consulted for admission.   Subjective: Seen and examined today Feeling great and Able to breather- on RA and laying flat Overnight  afebrile, VSS, Labs  creat 1.1> 1.2, mild anemia  Assessment and plan:  Acute on chronic diastolic HF: Last TTE on 03/2024 shows EF >75%, mild LVH, G1DD, but no valvular abnormalities Pt presented with progressive shortness of breath, dyspnea on exertion and leg swelling, with Lasix  patient is feeling much improved still has some leg edema, follow-up recheck echocardiogram. Cont to monitor daily I/O,weight, electrolytes  and net balance as below.Keep on  salt/fluid restricted diet and monitor in tele. Net IO Since Admission: -2,000 mL [05/16/24 1338]  There were no vitals filed for this visit.  Recent Labs  Lab 05/15/24 1617 05/16/24 0245  PROBNP 3,810.0*  --   BUN 20 21  CREATININE 1.19 1.28*  K 3.4* 3.8    Bradycardia Likely chronic follow-up echo TSH.  Continue holding beta-blocker He is followed by his cardiologist   Hypertensive urgency: SBP in the 150-180s and DBP 50-60s, widening of the pulse pressure likely due to aortic regurg versus anemia Continue home hydralazine  Imdur    Paroxysmal A-fib Cont Xarelto  but hold beta-blocker   Anemia Hgb of 8.6, around baseline of 8-9 Trend CBC, transfuse for Hgb > 7   Hx of alcoholic cirrhosis Chronic and stable, normal LFTs  History of CVA HLD: Continue Zetia  statin  GERD Continue PPI  Deconditioning/debility: Continue PT OT Mobility: PT Orders: Active PT Follow up Rec:     DVT prophylaxis: rivaroxaban  (XARELTO ) tablet 20 mg Start: 05/15/24 2145 Code Status:   Code Status: Full Code Family Communication: plan of care discussed with patient at bedside. Patient status is: Remains hospitalized because of severity of illness Level of care: Telemetry   Dispo: The patient is from: hole alone uses walker            Anticipated disposition: TBD Objective: Vitals last 24 hrs: Vitals:   05/16/24 0500 05/16/24 0600 05/16/24 0811 05/16/24 0900  BP: (!) 169/47 (!) 168/48  (!) 168/50  Pulse: (!) 43 (!) 44  (!) 56  Resp:  20  (!)  23  Temp:   98.5 F (36.9 C)   TempSrc:   Oral   SpO2: 97% 93%  96%    Physical Examination: General exam: alert awake, oriented, older than stated age HEENT:Oral mucosa moist, Ear/Nose WNL grossly Respiratory system: Bilaterally diminished BS,no use of accessory muscle Cardiovascular system: S1 & S2 +, No JVD. Gastrointestinal system: Abdomen soft,NT,ND, BS+ Nervous System: Alert, awake, moving all  extremities,and following commands. Extremities: extremities warm, leg edema + Skin: Warm, no rashes MSK: Normal muscle bulk,tone, power   Medications reviewed:  Scheduled Meds:  rivaroxaban   20 mg Oral Q supper   Continuous Infusions: Diet: Diet Order             Diet Heart Room service appropriate? Yes; Fluid consistency: Thin  Diet effective now                     Unresulted Labs (From admission, onward)     Start     Ordered   05/17/24 0500  Magnesium  Tomorrow morning,   R        05/16/24 0128   05/17/24 0500  TSH  Tomorrow morning,   R        05/16/24 0128   05/17/24 0500  Basic metabolic panel with GFR  Daily,   R      05/16/24 1004           Data Reviewed: I have personally reviewed following labs and imaging studies ( see epic result tab) CBC: Recent Labs  Lab 05/15/24 1617 05/16/24 0245  WBC 9.8 8.6  NEUTROABS 7.6  --   HGB 8.6* 9.2*  HCT 28.5* 29.2*  MCV 97.9 96.7  PLT 203 209   CMP: Recent Labs  Lab 05/15/24 1617 05/16/24 0245  NA 143 141  K 3.4* 3.8  CL 110 108  CO2 22 24  GLUCOSE 95 117*  BUN 20 21  CREATININE 1.19 1.28*  CALCIUM  8.3* 8.5*   GFR: Estimated Creatinine Clearance: 54.8 mL/min (A) (by C-G formula based on SCr of 1.28 mg/dL (H)). Recent Labs  Lab 05/15/24 1617  AST 15  ALT 17  ALKPHOS 99  BILITOT 1.3*  PROT 6.0*  ALBUMIN  3.6   No results for input(s): LIPASE, AMYLASE in the last 168 hours. No results for input(s): AMMONIA in the last 168 hours. Coagulation Profile: No results for input(s): INR, PROTIME in the last 168 hours. Antimicrobials/Microbiology: Anti-infectives (From admission, onward)    None         Component Value Date/Time   SDES URINE, CATHETERIZED 02/14/2014 0101   SPECREQUEST none 02/14/2014 0101   CULT NO GROWTH Performed at [facility] 02/14/2014 0101   REPTSTATUS 02/15/2014 FINAL 02/14/2014 0101    Procedures:    Mennie LAMY, MD [facility] 05/16/2024,  1:41 PM   "

## 2024-05-16 NOTE — Progress Notes (Signed)
 Heart Failure Navigator Progress Note  Assessed for Heart & Vascular TOC clinic readiness.  Patient does not meet criteria due to EF 60-65%, has a scheduled [facility] appointment on 06/13/2024. No HF TOC. .   Navigator will sign off at this time.   Stephane Haddock, BSN, Scientist, Clinical (histocompatibility And Immunogenetics) Only

## 2024-05-16 NOTE — Evaluation (Signed)
 Physical Therapy Evaluation Patient Details Name: [name] [name] MRN: [id number] DOB: April 22, 1951 Today's Date: 05/16/2024  History of Present Illness  Pt is 74 year old presented to [facility] on  05/15/24 for SOB and LE edema. Pt with acute on chronic diastolic HF. 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 Pt needs skilled PT to maximize independence and safety. Pt lives alone and has good support from friend(s). Pt with chronic arthritic knees which have limited his mobility. Able to amb in hallway with assist today. Expect steady progress and that pt will be able to return home. Pt is active with HHPT prior and recommend resuming that.           If plan is discharge home, recommend the following: A little help with walking and/or transfers;A little help with bathing/dressing/bathroom;Assist for transportation   Can travel by private vehicle        Equipment Recommendations None recommended by PT  Recommendations for Other Services       Functional Status Assessment Patient has had a recent decline in their functional status and demonstrates the ability to make significant improvements in function in a reasonable and predictable amount of time.     Precautions / Restrictions Precautions Precautions: Fall Recall of Precautions/Restrictions: Intact Restrictions Weight Bearing Restrictions Per Provider Order: No      Mobility  Bed Mobility Overal bed mobility: Needs Assistance Bed Mobility: Supine to Sit, Sit to Supine     Supine to sit: Min assist Sit to supine: Contact guard assist   General bed mobility comments: Assist to elevate trunk into sitting     Transfers Overall transfer level: Needs assistance Equipment used: Rolling walker (2 wheels) Transfers: Sit to/from Stand Sit to Stand: Contact guard assist           General transfer comment: for safety    Ambulation/Gait Ambulation/Gait assistance: Contact guard assist Gait Distance (Feet): 100 Feet Assistive device: Rolling walker (2 wheels) Gait Pattern/deviations: Step-through pattern, Decreased step length - right, Decreased step length - left, Decreased stride length, Trunk flexed Gait velocity: decr Gait velocity interpretation: 1.31 - 2.62 ft/sec, indicative of limited community ambulator   General Gait Details: Assist for safety. Decr knee flex with swing  Stairs            Wheelchair Mobility     Tilt Bed    Modified Rankin (Stroke Patients Only)       Balance Overall balance assessment: Needs assistance Sitting-balance support: No upper extremity supported, Feet supported Sitting balance-Leahy Scale: Fair     Standing balance support: Bilateral upper extremity supported, During functional activity Standing balance-Leahy Scale: Poor Standing balance comment: walker and supervision for static standing                             Pertinent Vitals/Pain Pain Assessment Pain Assessment: Faces Faces Pain Scale: Hurts even more Pain Location: bil knees - chronic Pain Descriptors / Indicators: Aching Pain Intervention(s): Limited activity within patient's tolerance, Monitored during session    Home Living Family/patient expects to be discharged to:: Private residence Living Arrangements: Alone Available Help at Discharge: Friend(s);Available PRN/intermittently (supportive  friend that can provide whatever is needed) Type of Home: House Home Access: Level entry       Home Layout: One level Home Equipment: Agricultural Consultant (2 wheels);Wheelchair - manual;Cane - single point;BSC/3in1      Prior Function Prior Level of Function :  Independent/Modified Independent             Mobility Comments: Modified independent with rolling walker       Extremity/Trunk Assessment   Upper Extremity Assessment Upper Extremity Assessment: Defer to OT evaluation    Lower Extremity Assessment Lower Extremity Assessment: Generalized weakness       Communication   Communication Communication: Impaired Factors Affecting Communication: Hearing impaired    Cognition Arousal: Alert Behavior During Therapy: WFL for tasks assessed/performed   PT - Cognitive impairments: No apparent impairments                         Following commands: Intact       Cueing Cueing Techniques: Verbal cues     General Comments      Exercises     Assessment/Plan    PT Assessment Patient needs continued PT services  PT Problem List Decreased strength;Decreased mobility;Decreased range of motion;Decreased balance;Decreased activity tolerance;Pain       PT Treatment Interventions DME instruction;Therapeutic activities;Gait training;Functional mobility training;Therapeutic exercise;Balance training;Patient/family education    PT Goals (Current goals can be found in the Care Plan section)  Acute Rehab PT Goals Patient Stated Goal: return home PT Goal Formulation: With patient Time For Goal Achievement: 05/30/24 Potential to Achieve Goals: Good    Frequency Min 2X/week     Co-evaluation               AM-PAC PT 6 Clicks Mobility  Outcome Measure Help needed turning from your back to your side while in a flat bed without using bedrails?: None Help needed moving from lying on your back to sitting on the side of a flat bed without using bedrails?: A Little Help needed moving to and from a bed to a chair (including a wheelchair)?: A Little Help needed standing up from a chair using your arms (e.g., wheelchair or bedside chair)?: A Little Help needed to walk in hospital room?: A Little Help needed climbing 3-5  steps with a railing? : A Little 6 Click Score: 19    End of Session Equipment Utilized During Treatment: Gait belt Activity Tolerance: Patient tolerated treatment well Patient left: in bed;with call bell/phone within reach Nurse Communication: Mobility status PT Visit Diagnosis: Other abnormalities of gait and mobility (R26.89);Muscle weakness (generalized) (M62.81);Difficulty in walking, not elsewhere classified (R26.2);Pain Pain - Right/Left:  (bilateral) Pain - part of body: Knee    Time: 1455-1516 PT Time Calculation (min) (ACUTE ONLY): 21 min   Charges:   PT Evaluation $PT Eval Moderate Complexity: 1 Mod   PT General Charges $$ ACUTE PT VISIT: 1 Visit         [facility] PT Acute Rehabilitation Services Office [phone]   Rodgers ORN [facility] 05/16/2024, 4:42 PM

## 2024-05-17 DIAGNOSIS — I5033 Acute on chronic diastolic (congestive) heart failure: Secondary | ICD-10-CM | POA: Diagnosis not present

## 2024-05-17 LAB — BASIC METABOLIC PANEL WITH GFR
Anion gap: 11 (ref 5–15)
BUN: 29 mg/dL — ABNORMAL HIGH (ref 8–23)
CO2: 24 mmol/L (ref 22–32)
Calcium: 8.1 mg/dL — ABNORMAL LOW (ref 8.9–10.3)
Chloride: 104 mmol/L (ref 98–111)
Creatinine, Ser: 1.59 mg/dL — ABNORMAL HIGH (ref 0.61–1.24)
GFR, Estimated: 45 mL/min — ABNORMAL LOW
Glucose, Bld: 102 mg/dL — ABNORMAL HIGH (ref 70–99)
Potassium: 3.5 mmol/L (ref 3.5–5.1)
Sodium: 139 mmol/L (ref 135–145)

## 2024-05-17 LAB — MAGNESIUM: Magnesium: 1.8 mg/dL (ref 1.7–2.4)

## 2024-05-17 LAB — TSH: TSH: 2.24 u[IU]/mL (ref 0.350–4.500)

## 2024-05-17 LAB — GLUCOSE, CAPILLARY: Glucose-Capillary: 115 mg/dL — ABNORMAL HIGH (ref 70–99)

## 2024-05-17 NOTE — Progress Notes (Signed)
 " PROGRESS NOTE TYROME DONATELLI  FMW:[id number] DOB: 06/28/1950 DOA: 05/15/2024 PCP: Loring Tanda Mae, MD  Brief Narrative/Hospital Course: [name] [name] is a 74 y.o. male with PMH of HTN Aortic stenosis, bradycardia (baseline HR in the 40s), chronic HFpEF, CVA 03/03/24 s/p TNK attributed to new onset A-fib/flutter in November now on Xarelto , right femur fracture s/p ORIF, alcoholic cirrhosis, OSA and GERD who presented to the ED for evaluation of shortness of breath. Patient reports that over the last 4 to 5 days, he has had progressive shortness of breath and dyspnea on exertion. He has not been able to lay flat in bed to sleep so he has been sleeping on his recliner. He had a follow-up with his cardiologist yesterday and there were discussions about possibly starting him on Lasix  after lab work.  He reports associated leg swelling but denies any dizziness, syncope, palpitations, chest pain, nausea, vomiting, abdominal pain, fevers or chills.  ED Course: Initial vitals show patient afebrile, RR 11-26, HR 30s and 40s, SBP 150-180s, SpO2 100% on 2 L, wean to room air. Initial labs significant for proBNP 3810, K+ 3.4, troponin 43-44, Hgb 8.6, normal renal function, negative flu/RSV/COVID test. EKG shows sinus bradycardia with atypical LBBB. CXR shows cardiomegaly with vascular congestion and mild interstitial edema. Pt received IV Lasix  60 mg x 1. TRH was consulted for admission.   Subjective: Seen and examined Feels much better overall, patient Sister at the bedside Denies nausea vomiting.  Able to lay flat Overnight  afebrile, VSS, although intermittent bradycardia, BP stable, on room air labs  creat 1.1> 1.2> 1.5, lasix  held  Assessment and plan:  Acute on chronic diastolic HF: Pt presented with progressive shortness of breath, dyspnea on exertion and leg swelling Last TTE 03/2024 EF >75%, mild LVH, G1DD, but no valvular abnormalities-repeat echo shows EF 60-65% G2 DD no RWMA MV normal aortic valve  calcified RV systolic function normal.  , with IV Lasix  overall much improved. With creatinine trending up hold further IV Lasix ,, monitor daily weight intake output Net IO Since Admission: -1,880 mL [05/17/24 1257]  Filed Weights   05/16/24 2100 05/17/24 0442  Weight: 88.9 kg 88.5 kg    Recent Labs  Lab 05/15/24 1617 05/16/24 0245 05/17/24 0445  PROBNP 3,810.0*  --   --   BUN 20 21 29*  CREATININE 1.19 1.28* 1.59*  K 3.4* 3.8 3.5  MG  --   --  1.8   Mild AKI: Creatinine trending up 1.5 in the setting of diuresis holding diuretics encourage p.o. monitor renal function closely, previously baseline creatinine has been up to 1.9 Recent Labs    03/14/24 0111 04/15/24 1100 04/15/24 1622 04/16/24 0521 04/17/24 0553 04/18/24 0352 04/19/24 0226 04/20/24 0322 05/15/24 1617 05/16/24 0245 05/17/24 0445  BUN 26* 57*  --  47* 33* 24* 23 21 20 21  29*  CREATININE 1.19 2.67* 2.30* 1.91* 1.21 0.90 1.09 1.03 1.19 1.28* 1.59*  CO2 21* 21*  --  23 20* 18* 20* 18* 22 24 24   K 4.5 4.7  --  4.6 4.4 4.1 3.7 3.7 3.4* 3.8 3.5    Bradycardia Likely chronic, asymptomatic TSH stable 2.2.  Holding beta-blocker. He is followed by his cardiologist   Hypertensive urgency: SBP in the 150-180s and DBP 50-60s, widening of the pulse pressure likely due to aortic regurg versus anemia Fairly controlled on continue home hydralazine  Imdur . Hold losartan  and bystolic .   Paroxysmal A-fib Cont Xarelto  amiodarone , but hold beta-blocker due to slow  heart rate   Anemia Hgb of 8.6, around baseline of 8-9 Trend CBC, transfuse for Hgb > 7   Hx of alcoholic cirrhosis Chronic and stable, normal LFTs  History of CVA HLD: Continue Zetia  statin  GERD Continue PPI  Deconditioning/debility: Continue PT OT Mobility: PT Orders: Active PT Follow up Rec: Home Health Pt (Resume - Pt Active With Hh Prior To Adm)05/16/2024 1639    DVT prophylaxis:  Code Status:   Code Status: Full Code Family Communication: plan  of care discussed with patient and his brother at bedside. Patient status is: Remains hospitalized because of severity of illness Level of care: Telemetry   Dispo: The patient is from: hole alone uses walker            Anticipated disposition: home w/ HH pending impronement in creatinine Objective: Vitals last 24 hrs: Vitals:   05/16/24 2325 05/17/24 0442 05/17/24 0700 05/17/24 1233  BP: (!) 150/57 (!) 154/48 (!) 138/58 (!) 137/45  Pulse: (!) 50 (!) 46 (!) 44 61  Resp: 19 16 18 20   Temp: 98.6 F (37 C) 98.2 F (36.8 C) 98 F (36.7 C) 97.6 F (36.4 C)  TempSrc: Oral Oral Oral Oral  SpO2: 97% 93% 92% 96%  Weight:  88.5 kg      Physical Examination: General exam: AAOX3 HEENT:Oral mucosa moist, Ear/Nose WNL grossly Respiratory system: Bilaterally diminished BS,no use of accessory muscle Cardiovascular system: S1 & S2 +, No JVD. Gastrointestinal system: Abdomen soft,NT,ND, BS+ Nervous System: Alert, awake, moving all extremities,and following commands. Extremities: extremities warm, leg edema minimal  Skin: Warm, no rashes MSK: Normal muscle bulk,tone, power   Medications reviewed:  Scheduled Meds:  amiodarone   100 mg Oral Daily   atorvastatin   40 mg Oral QPM   ezetimibe   10 mg Oral Daily   ferrous sulfate   325 mg Oral QODAY   hydrALAZINE   100 mg Oral BID   isosorbide  dinitrate  30 mg Oral BID   rivaroxaban   20 mg Oral Q supper   Continuous Infusions: Diet: Diet Order             Diet Heart Room service appropriate? Yes; Fluid consistency: Thin  Diet effective now                     Unresulted Labs (From admission, onward)     Start     Ordered   05/17/24 0500  Basic metabolic panel with GFR  Daily,   R      05/16/24 1004           Data Reviewed: I have personally reviewed following labs and imaging studies ( see epic result tab) CBC: Recent Labs  Lab 05/15/24 1617 05/16/24 0245  WBC 9.8 8.6  NEUTROABS 7.6  --   HGB 8.6* 9.2*  HCT 28.5* 29.2*   MCV 97.9 96.7  PLT 203 209   CMP: Recent Labs  Lab 05/15/24 1617 05/16/24 0245 05/17/24 0445  NA 143 141 139  K 3.4* 3.8 3.5  CL 110 108 104  CO2 22 24 24   GLUCOSE 95 117* 102*  BUN 20 21 29*  CREATININE 1.19 1.28* 1.59*  CALCIUM  8.3* 8.5* 8.1*  MG  --   --  1.8   GFR: Estimated Creatinine Clearance: 43.3 mL/min (A) (by C-G formula based on SCr of 1.59 mg/dL (H)). Recent Labs  Lab 05/15/24 1617  AST 15  ALT 17  ALKPHOS 99  BILITOT 1.3*  PROT 6.0*  ALBUMIN   3.6   No results for input(s): LIPASE, AMYLASE in the last 168 hours. No results for input(s): AMMONIA in the last 168 hours. Coagulation Profile: No results for input(s): INR, PROTIME in the last 168 hours. Antimicrobials/Microbiology: Anti-infectives (From admission, onward)    None         Component Value Date/Time   SDES URINE, CATHETERIZED 02/14/2014 0101   SPECREQUEST none 02/14/2014 0101   CULT NO GROWTH Performed at [facility] 02/14/2014 0101   REPTSTATUS 02/15/2014 FINAL 02/14/2014 0101    Procedures:    Mennie LAMY, MD [facility] 05/17/2024, 12:57 PM   "

## 2024-05-17 NOTE — Evaluation (Signed)
 Occupational Therapy Evaluation Patient Details Name: [name] [name] MRN: [id number] DOB: [birth year] Today's Date: 05/17/2024   History of Present Illness   Pt is 74 year old presented to [facility] on  05/15/24 for SOB and LE edema. Pt with acute on chronic diastolic HF. PMH - 03/03/24 small left occipital infarct R hip fx, s/p ORIF 03/06/24, ETOH abuse, CHF, cirrhosis of the liver, esophageal varices, GERD, OSA, HTN, bradycardia, sleep apnea, and vocal cord paralysis on the left     Clinical Impressions Pt presented with family in the room and reported he lives alone but has a caregiver to assist as needed for 12 hours but is alone at night. His family will take pt out in [facility] and uses a RW within the house but does limited ambulation due to chronic B knee pain. At this time completed ADLS at sink with supervision to [facility] an then completed toileting tasks with min assist from toilet and needed mod assist post peri care from BM. It was advised with family for caregiver to check skin for thoroughness post peri care with the return to home and look into ALF setting to have increase in supervision. Pt wants to resume [facility] care with the return to home. Acute Occupational Therapy to continue to follow.      If plan is discharge home, recommend the following:   A little help with walking and/or transfers;A little help with bathing/dressing/bathroom;Assistance with cooking/housework;Help with stairs or ramp for entrance;Supervision due to cognitive status     Functional Status Assessment   Patient has had a recent decline in their functional status and demonstrates the ability to make significant improvements in function in a reasonable and predictable amount of time.     Equipment Recommendations   None recommended by OT     Recommendations for Other Services         Precautions/Restrictions   Precautions Precautions: Fall Recall of Precautions/Restrictions: Intact Restrictions Weight  Bearing Restrictions Per Provider Order: No     Mobility Bed Mobility Overal bed mobility: Needs Assistance Bed Mobility: Supine to Sit, Sit to Supine     Supine to sit: Modified independent (Device/Increase time) Sit to supine: Min assist   General bed mobility comments: pt sleeps in a recliner at home    Transfers Overall transfer level: Needs assistance Equipment used: Rolling walker (2 wheels) Transfers: Sit to/from Stand Sit to Stand: Contact guard assist, Min assist           General transfer comment: min from toilet      Balance Overall balance assessment: Needs assistance Sitting-balance support: Feet supported Sitting balance-Leahy Scale: Good     Standing balance support: Bilateral upper extremity supported, Single extremity supported Standing balance-Leahy Scale: Fair Standing balance comment: due to chronic B knee pain                           ADL either performed or assessed with clinical judgement   ADL Overall ADL's : Needs assistance/impaired Eating/Feeding: Independent;Sitting   Grooming: Wash/dry hands;Wash/dry face;Oral care;Contact guard assist;Standing   Upper Body Bathing: Set up;Sitting   Lower Body Bathing: Moderate assistance;Sit to/from stand   Upper Body Dressing : Set up;Sitting   Lower Body Dressing: Moderate assistance;Sit to/from stand   Toilet Transfer: Contact guard assist;Rolling walker (2 wheels)   Toileting- Clothing Manipulation and Hygiene: Contact guard assist;Sit to/from stand       Functional mobility during ADLs: Supervision/safety;Rolling walker (2  wheels);Cueing for sequencing;Cueing for safety       Vision Baseline Vision/History: 1 Wears glasses Ability to See in Adequate Light: 0 Adequate Patient Visual Report: No change from baseline Vision Assessment?: Wears glasses for reading;Wears glasses for driving     Perception Perception: Within Functional Limits       Praxis Praxis: WFL        Pertinent Vitals/Pain Pain Assessment Pain Assessment: Faces Faces Pain Scale: Hurts even more Pain Location: bil knees - chronic Pain Descriptors / Indicators: Aching Pain Intervention(s): Limited activity within patient's tolerance, Monitored during session, Repositioned     Extremity/Trunk Assessment Upper Extremity Assessment Upper Extremity Assessment: Generalized weakness   Lower Extremity Assessment Lower Extremity Assessment: Defer to PT evaluation   Cervical / Trunk Assessment Cervical / Trunk Assessment: Kyphotic   Communication Communication Communication: Impaired Factors Affecting Communication: Hearing impaired   Cognition Arousal: Alert Behavior During Therapy: WFL for tasks assessed/performed Cognition: Cognition impaired       Memory impairment (select all impairments): Short-term memory Attention impairment (select first level of impairment): Divided attention Executive functioning impairment (select all impairments): Problem solving, Reasoning OT - Cognition Comments: Higher level reasoning skills                 Following commands: Impaired Following commands impaired: Follows multi-step commands with increased time     Cueing  General Comments   Cueing Techniques: Verbal cues      Exercises     Shoulder Instructions      Home Living Family/patient expects to be discharged to:: Private residence Living Arrangements: Alone Available Help at Discharge: Friend(s);Available PRN/intermittently (caregivers in the home 12 hours a day) Type of Home: House Home Access: Level entry     Home Layout: One level     Bathroom Shower/Tub: Walk-in shower;Tub/shower unit   Bathroom Toilet: Standard     Home Equipment: Agricultural Consultant (2 wheels);Wheelchair - manual;Cane - single point;BSC/3in1;Shower seat - built in;Shower seat          Prior Functioning/Environment Prior Level of Function : Independent/Modified Independent              Mobility Comments: Modified independent with rolling walker ADLs Comments: Mod I; states LB dressing is difficult, therfore he does not wear underwear or socks and only wears slip on shoes. Often has caregivers complete. Pt sleeps in a recliner at home.    OT Problem List: Decreased strength;Decreased activity tolerance;Impaired balance (sitting and/or standing);Decreased knowledge of use of DME or AE;Decreased safety awareness;Pain   OT Treatment/Interventions: Self-care/ADL training;DME and/or AE instruction;Therapeutic activities;Balance training;Patient/family education      OT Goals(Current goals can be found in the care plan section)   Acute Rehab OT Goals Patient Stated Goal: to go home OT Goal Formulation: With patient Time For Goal Achievement: 05/31/24 Potential to Achieve Goals: Good   OT Frequency:  Min 2X/week    Co-evaluation              AM-PAC OT 6 Clicks Daily Activity     Outcome Measure Help from another person eating meals?: None Help from another person taking care of personal grooming?: None Help from another person toileting, which includes using toliet, bedpan, or urinal?: A Little Help from another person bathing (including washing, rinsing, drying)?: A Little Help from another person to put on and taking off regular upper body clothing?: None Help from another person to put on and taking off regular lower body clothing?: A Little 6 Click  Score: 21   End of Session Equipment Utilized During Treatment: Gait belt;Rolling walker (2 wheels) Nurse Communication: Mobility status  Activity Tolerance: Patient tolerated treatment well Patient left: in bed;with call bell/phone within reach;with bed alarm set  OT Visit Diagnosis: Unsteadiness on feet (R26.81);Other abnormalities of gait and mobility (R26.89);Muscle weakness (generalized) (M62.81);Pain Pain - part of body: Knee (bil)                Time: 8941-8864 OT Time Calculation (min): 37  min Charges:  OT General Charges $OT Visit: 1 Visit OT Evaluation $OT Eval Low Complexity: 1 Low OT Treatments $Self Care/Home Management : 8-22 mins  Warrick POUR OTR/L  Acute Rehab Services  [phone] office number   Warrick Berber 05/17/2024, 11:46 AM

## 2024-05-18 ENCOUNTER — Other Ambulatory Visit (HOSPITAL_COMMUNITY): Payer: Self-pay

## 2024-05-18 ENCOUNTER — Other Ambulatory Visit: Payer: Self-pay

## 2024-05-18 DIAGNOSIS — I5033 Acute on chronic diastolic (congestive) heart failure: Secondary | ICD-10-CM | POA: Diagnosis not present

## 2024-05-18 LAB — BASIC METABOLIC PANEL WITH GFR
Anion gap: 10 (ref 5–15)
BUN: 29 mg/dL — ABNORMAL HIGH (ref 8–23)
CO2: 24 mmol/L (ref 22–32)
Calcium: 8.4 mg/dL — ABNORMAL LOW (ref 8.9–10.3)
Chloride: 102 mmol/L (ref 98–111)
Creatinine, Ser: 1.14 mg/dL (ref 0.61–1.24)
GFR, Estimated: >60 mL/min
Glucose, Bld: 104 mg/dL — ABNORMAL HIGH (ref 70–99)
Potassium: 3.4 mmol/L — ABNORMAL LOW (ref 3.5–5.1)
Sodium: 137 mmol/L (ref 135–145)

## 2024-05-18 MED ORDER — POTASSIUM CHLORIDE CRYS ER 20 MEQ PO TBCR
40.0000 meq | EXTENDED_RELEASE_TABLET | Freq: Once | ORAL | Status: AC
  Administered 2024-05-18: 40 meq via ORAL
  Filled 2024-05-18: qty 2

## 2024-05-18 MED ORDER — FUROSEMIDE 20 MG PO TABS
20.0000 mg | ORAL_TABLET | Freq: Every day | ORAL | 0 refills | Status: DC
  Filled 2024-05-18: qty 30, 30d supply, fill #0

## 2024-05-18 MED ORDER — POTASSIUM CHLORIDE CRYS ER 20 MEQ PO TBCR
20.0000 meq | EXTENDED_RELEASE_TABLET | Freq: Every day | ORAL | 0 refills | Status: DC
  Filled 2024-05-18: qty 30, 30d supply, fill #0

## 2024-05-18 NOTE — Care Management Important Message (Signed)
 Important Message  Patient Details  Name: [name] [name] MRN: [id number] Date of Birth: [birth year]   Important Message Given:  Yes - Medicare IM     Vonzell Arrie Sharps 05/18/2024, 10:49 AM

## 2024-05-18 NOTE — Discharge Summary (Signed)
 Physician Discharge Summary  [name] [name] FMW:[id number] DOB: [birth year] DOA: 05/15/2024  PCP: Loring Tanda Mae, MD  Admit date: 05/15/2024 Discharge date: 05/18/2024 Recommendations for Outpatient Follow-up:  Follow up with PCP in 1 weeks-call for appointment Please obtain BMP/CBC in one week  Discharge Dispo: home Discharge Condition: Stable Code Status:   Code Status: Full Code Diet recommendation:  Diet Order             Diet Heart Room service appropriate? Yes; Fluid consistency: Thin  Diet effective now                 Brief/Interim Summary: [name] [name] is a 74 y.o. male with PMH of HTN Aortic stenosis, bradycardia (baseline HR in the 40s), chronic HFpEF, CVA 03/03/24 s/p TNK attributed to new onset A-fib/flutter in November now on Xarelto , right femur fracture s/p ORIF, alcoholic cirrhosis, OSA and GERD who presented to the ED for evaluation of shortness of breath. Patient reports that over the last 4 to 5 days, he has had progressive shortness of breath and dyspnea on exertion. He has not been able to lay flat in bed to sleep so he has been sleeping on his recliner. He had a follow-up with his cardiologist yesterday and there were discussions about possibly starting him on Lasix  after lab work.  He reports associated leg swelling but denies any dizziness, syncope, palpitations, chest pain, nausea, vomiting, abdominal pain, fevers or chills.  ED Course: Initial vitals show patient afebrile, RR 11-26, HR 30s and 40s, SBP 150-180s, SpO2 100% on 2 L, wean to room air. Initial labs significant for proBNP 3810, K+ 3.4, troponin 43-44, Hgb 8.6, normal renal function, negative flu/RSV/COVID test. EKG shows sinus bradycardia with atypical LBBB. CXR shows cardiomegaly with vascular congestion and mild interstitial edema. Pt received IV Lasix  60 mg x 1. TRH was consulted for admission.  Patient was admitted manage with IV Lasix , echo showed normal EF with G2 DD. Leg edema shortness of  breath significantly improved, slight bump in creatinine so Lasix  held subsequently creatinine normalized now put back on oral Lasix  and being discharged home.  Discussed with cardiology will arrange outpatient follow-up early February and Advised to follow-up with PCP to check BMP at High risk for readmission, arranging home health nurse PT OT  Subjective: Seen and examined Alert awake oriented on room air, eager to go home Overnight afebrile BP  in 180s creat 1.1> 1.2> 1.5> 1.1  Discharge Diagnoses:   Acute on chronic diastolic HF: Pt presented with progressive shortness of breath, dyspnea on exertion and leg swelling Last TTE 03/2024 EF >75%, mild LVH, G1DD, but no valvular abnormalities-repeat echo shows EF 60-65% G2 DD no RWMA MV normal aortic valve calcified RV systolic function normal.  Overall improved with IV Lasix  creatinine stabilized resume p.o. Lasix  at home.  PTA was on as needed Lasix .call cardiology to arrange outpatient follow-up   Mild AKI Hypokalemia: In the setting of CHF and diuresis.  Creatinine improved to 1.1.  Continue p.o. Lasix  at home follow-up BMET in 1 week.  Put Cipro  please continue oral K-Dur while on p.o. Lasix    Bradycardia Likely chronic, asymptomatic TSH stable 2.2.  Holding beta-blocker. He is followed by his cardiologist   Hypertensive urgency: Overall stable continue home regimen holding by to leave due to bradycardia follow-up with cardiology    Paroxysmal A-fib Cont Xarelto  amiodarone , but hold beta-blocker due to bradycardia.    Anemia Hgb of 8.6, around baseline of 8-9 Trend CBC,  transfuse for Hgb > 7   Hx of alcoholic cirrhosis Chronic and stable, normal LFTs  History of CVA HLD: Continue Zetia  statin  GERD Continue PPI  Deconditioning/debility: Continue PT OT Mobility: PT Orders: Active PT Follow up Rec: Home Health Pt1/16/2026 [id number]    DVT prophylaxis:  Code Status:   Code Status: Full Code Family Communication: plan of  care discussed with patient and his brother 1/15 Patient status is: Remains hospitalized because of severity of illness Level of care: Telemetry   Dispo: The patient is from: hole alone uses walker            Anticipated disposition: home w/ HH  Objective: Vitals last 24 hrs: Vitals:   05/18/24 0053 05/18/24 0500 05/18/24 0523 05/18/24 0816  BP: (!) 141/47  (!) 185/67 (!) 144/55  Pulse:    60  Resp: 18  18 20   Temp: 98.1 F (36.7 C)  98 F (36.7 C) 97.9 F (36.6 C)  TempSrc: Oral  Oral Oral  SpO2: 98%  100% 96%  Weight:  88.3 kg      Physical Examination: General exam: AAOX3 HEENT:Oral mucosa moist, Ear/Nose WNL grossly Respiratory system: Bilaterally diminished BS,no use of accessory muscle Cardiovascular system: S1 & S2 +, No JVD. Gastrointestinal system: Abdomen soft,NT,ND, BS+ Nervous System: Alert, awake, moving all extremities,and following commands. Extremities: extremities warm, leg edema minimal  Skin: Warm, no rashes MSK: Normal muscle bulk,tone, power  Consultation: See note.  Discharge Instructions  Discharge Instructions     (HEART FAILURE PATIENTS) Call MD:  Anytime you have any of the following symptoms: 1) 3 pound weight gain in 24 hours or 5 pounds in 1 week 2) shortness of breath, with or without a dry hacking cough 3) swelling in the hands, feet or stomach 4) if you have to sleep on extra pillows at night in order to breathe.   Complete by: As directed    Discharge instructions   Complete by: As directed    Please call call MD or return to ER for similar or worsening recurring problem that brought you to hospital or if any fever,nausea/vomiting,abdominal pain, uncontrolled pain, chest pain,  shortness of breath or any other alarming symptoms.  Please follow-up your doctor as instructed in a week time and call the office for appointment.  Please avoid alcohol , smoking, or any other illicit substance and maintain healthy habits including taking your  regular medications as prescribed.  You were cared for by a hospitalist during your hospital stay. If you have any questions about your discharge medications or the care you received while you were in the hospital after you are discharged, you can call the unit and ask to speak with the hospitalist on call if the hospitalist that took care of you is not available.  Once you are discharged, your primary care physician will handle any further medical issues. Please note that NO REFILLS for any discharge medications will be authorized once you are discharged, as it is imperative that you return to your primary care physician (or establish a relationship with a primary care physician if you do not have one) for your aftercare needs so that they can reassess your need for medications and monitor your lab values   Face-to-face encounter (required for Medicare/Medicaid patients)   Complete by: As directed    I Mennie LAMY certify that this patient is under my care and that I, or a nurse practitioner or physician's assistant working with me, had a face-to-face encounter  that meets the physician face-to-face encounter requirements with this patient on 05/18/2024. The encounter with the patient was in whole, or in part for the following medical condition(s) which is the primary reason for home health care (List medical condition): heartfailure, weakness   The encounter with the patient was in whole, or in part, for the following medical condition, which is the primary reason for home health care: heart failure, weakness   I certify that, based on my findings, the following services are medically necessary home health services: Physical therapy   Reason for Medically Necessary Home Health Services: Therapy- Therapeutic Exercises to Increase Strength and Endurance   My clinical findings support the need for the above services: Unable to leave home safely without assistance and/or assistive device   Further, I certify  that my clinical findings support that this patient is homebound due to: Shortness of Breath with activity   Home Health   Complete by: As directed    To provide the following care/treatments:  PT OT RN     Increase activity slowly   Complete by: As directed       Allergies as of 05/18/2024       Reactions   Adhesive [tape] Dermatitis, Other (See Comments)   Skin blisters after tape removal OK with using paper tape   Amoxicillin Hives   Calan Sr [verapamil] Cough   Cardura [doxazosin Mesylate] Other (See Comments)   Unknown reaction   Celebrex [celecoxib] Nausea And Vomiting   Cephalosporins Hives   Clavulanic Acid Other (See Comments)   Clindamycin/lincomycin Hives   Dexon [dexamethasone  Sodium Phosphate ] Other (See Comments)   Unknown reaction   Duraprep [antiseptic Products, Misc.] Dermatitis, Other (See Comments)   Localized skin blisters around application site when applied topically   Ery-tab [erythromycin] Hives   Hctz [hydrochlorothiazide ] Other (See Comments)   Photosensitivity  Burning sensation    Iodine Dermatitis, Other (See Comments)   Localized skin blisters around application site when applied topically   Keflex [cephalexin] Diarrhea, Nausea And Vomiting   body rejects it   Lipitor [atorvastatin ]    Norvasc  [amlodipine ] Other (See Comments)   Unknown reaction   Tenormin  [atenolol ] Other (See Comments)   Unknown reaction   Toprol  Xl [metoprolol  Succinate] Other (See Comments)   Unknown reaction   Vasotec [enalapril] Cough   Vioxx [rofecoxib] Hives, Nausea And Vomiting   Zocor [simvastatin] Nausea And Vomiting   Latex Rash        Medication List     STOP taking these medications    nebivolol  10 MG tablet Commonly known as: BYSTOLIC        TAKE these medications    acetaminophen  325 MG tablet Commonly known as: TYLENOL  Take 1-2 tablets (325-650 mg total) by mouth every 6 (six) hours as needed for mild pain.   amiodarone  200 MG  tablet Commonly known as: PACERONE  Take 1/2 tablets (100 mg total) by mouth daily.   atorvastatin  40 MG tablet Commonly known as: LIPITOR Take 1 tablet (40 mg total) by mouth daily. What changed: when to take this   ezetimibe  10 MG tablet Commonly known as: ZETIA  Take 1 tablet (10 mg total) by mouth daily.   ferrous sulfate  325 (65 FE) MG EC tablet Take 325 mg by mouth every other day.   furosemide  20 MG tablet Commonly known as: LASIX  Take 1 tablet (20 mg total) by mouth daily. What changed:  medication strength how much to take when to take this reasons to take  this   hydrALAZINE  100 MG tablet Commonly known as: APRESOLINE  Take 1 tablet (100 mg total) by mouth 2 (two) times daily.   isosorbide  dinitrate 30 MG tablet Commonly known as: ISORDIL  Take 1 tablet (30 mg total) by mouth 2 (two) times daily.   losartan  25 MG tablet Commonly known as: COZAAR  Take 1 tablet (25 mg total) by mouth daily.   polyethylene glycol 17 g packet Commonly known as: MIRALAX  / GLYCOLAX  Take 17 g by mouth daily.   potassium chloride  SA 20 MEQ tablet Commonly known as: KLOR-CON  M Take 1 tablet (20 mEq total) by mouth daily.   Xarelto  20 MG Tabs tablet Generic drug: rivaroxaban  Take 1 tablet (20 mg total) by mouth daily with supper. What changed: when to take this        Contact information for follow-up providers     Loring Tanda Mae, MD Follow up in 1 week(s).   Specialty: Family Medicine Contact information: [address] [place] [address] [phone]              Contact information for after-discharge care     Home Medical Care     [facility]) .   Service: Home Health Services Why: Physical and Occupational Therapy, Registered Nurse-office to call with visit times. Contact information: [address] [place]  [address] [phone]                     Allergies[1]  The results of significant diagnostics from this hospitalization (including imaging, microbiology, ancillary and laboratory) are listed below for reference.    Microbiology: Recent Results (from the past 240 hours)  Resp panel by RT-PCR (RSV, Flu A&B, Covid) Anterior Nasal Swab     Status: None   Collection Time: 05/15/24  4:17 PM   Specimen: Anterior Nasal Swab  Result Value Ref Range Status   SARS Coronavirus 2 by RT PCR NEGATIVE NEGATIVE Final   Influenza A by PCR NEGATIVE NEGATIVE Final   Influenza B by PCR NEGATIVE NEGATIVE Final    Comment: (NOTE) The Xpert Xpress SARS-CoV-2/FLU/RSV plus assay is intended as an aid in the diagnosis of influenza from Nasopharyngeal swab specimens and should not be used as a sole basis for treatment. Nasal washings and aspirates are unacceptable for Xpert Xpress SARS-CoV-2/FLU/RSV testing.  Fact Sheet for Patients: bloggercourse.com  Fact Sheet for Healthcare Providers: seriousbroker.it  This test is not yet approved or cleared by the [place]  FDA and has been authorized for detection and/or diagnosis of SARS-CoV-2 by FDA under an Emergency Use Authorization (EUA). This EUA will remain in effect (meaning this test can be used) for the duration of the COVID-19 declaration under Section 564(b)(1) of the Act, 21 U.S.C. section 360bbb-3(b)(1), unless the authorization is terminated or revoked.     Resp Syncytial Virus by PCR NEGATIVE NEGATIVE Final    Comment: (NOTE) Fact Sheet for Patients: bloggercourse.com  Fact Sheet for Healthcare Providers: seriousbroker.it  This test is not yet approved or cleared by the [place]  FDA and has been authorized for detection and/or diagnosis of SARS-CoV-2 by FDA under an Emergency Use Authorization (EUA). This EUA will remain in effect (meaning this test can be used) for the  duration of the COVID-19 declaration under Section 564(b)(1) of the Act, 21 U.S.C. section 360bbb-3(b)(1), unless the authorization is terminated or revoked.  Performed at [facility], [address], [place], [place]  [address]     Procedures/Studies: ECHOCARDIOGRAM COMPLETE Result Date: 05/16/2024    ECHOCARDIOGRAM REPORT   Patient Name:   [name] [name] Date of Exam: 05/16/2024 Medical Rec #:  [id number]     Height:       67.0 in Accession #:    [id number]    Weight:       203.0 lb Date of Birth:  December 24, 1950     BSA:          2.035 m Patient Age:    73 years      BP:           182/57 mmHg Patient Gender: M             HR:           46 bpm. Exam Location:  Inpatient Procedure: 2D Echo, Cardiac Doppler, Color Doppler and Intracardiac            Opacification Agent (Both Spectral and Color Flow Doppler were            utilized during procedure). Indications:    Dyspnea  History:        Patient has prior history of Echocardiogram examinations, most                 recent 03/04/2024. Risk Factors:Hypertension, Dyslipidemia and                 Sleep Apnea.  Sonographer:    Philomena Daring Referring Phys: LOU MILL, M IMPRESSIONS  1. Left ventricular ejection fraction, by estimation, is 60 to 65%. The left ventricle has normal function. The left ventricle has no regional wall motion abnormalities. There is mild left ventricular hypertrophy. Left ventricular diastolic parameters are consistent with Grade II diastolic dysfunction (pseudonormalization). Elevated left atrial pressure.  2. Right ventricular systolic function is normal. The right ventricular size is normal.  3. Left atrial size was mildly dilated.  4. The mitral valve is normal in structure. No evidence of mitral valve regurgitation. No evidence of mitral stenosis. Moderate mitral annular calcification.  5. The aortic valve is calcified. Aortic valve regurgitation is mild. Moderate aortic valve stenosis.  6. The inferior vena cava is normal  in size with greater than 50% respiratory variability, suggesting right atrial pressure of 3 mmHg. FINDINGS  Left Ventricle: Left ventricular ejection fraction, by estimation, is 60 to 65%. The left ventricle has normal function. The left ventricle has no regional wall motion abnormalities. Definity  contrast agent was given IV to delineate the left ventricular  endocardial borders. The left ventricular internal cavity size was normal in size. There is mild left ventricular hypertrophy. Left ventricular diastolic parameters are consistent with Grade II diastolic dysfunction (pseudonormalization). Elevated left atrial pressure. Right Ventricle: The right ventricular size is normal. Right ventricular systolic function is normal. Left Atrium: Left atrial size was mildly dilated. Right Atrium: Right atrial size was normal in size. Pericardium: There is no evidence of pericardial effusion. Mitral Valve: The mitral valve is normal in structure. Moderate mitral annular calcification. No evidence of mitral valve regurgitation. No evidence of mitral valve stenosis. Tricuspid Valve: The tricuspid valve is normal in structure. Tricuspid valve regurgitation is trivial. No evidence of tricuspid stenosis. Aortic Valve: The aortic valve is calcified. Aortic valve regurgitation is mild. Moderate aortic stenosis is present. Aortic valve mean gradient measures 19.7 mmHg. Aortic valve peak gradient measures 36.5 mmHg. Aortic valve area, by VTI measures 1.46 cm. Pulmonic Valve: The pulmonic valve was not  well visualized. Pulmonic valve regurgitation is not visualized. No evidence of pulmonic stenosis. Aorta: The aortic root is normal in size and structure. Venous: The inferior vena cava is normal in size with greater than 50% respiratory variability, suggesting right atrial pressure of 3 mmHg. IAS/Shunts: No atrial level shunt detected by color flow Doppler.  LEFT VENTRICLE PLAX 2D LVIDd:         5.40 cm   Diastology LVIDs:          3.50 cm   LV e' medial:    5.55 cm/s LV PW:         1.30 cm   LV E/e' medial:  26.5 LV IVS:        1.10 cm   LV e' lateral:   7.40 cm/s LVOT diam:     2.20 cm   LV E/e' lateral: 19.9 LV SV:         113 LV SV Index:   56 LVOT Area:     3.80 cm  RIGHT VENTRICLE             IVC RV S prime:     12.40 cm/s  IVC diam: 2.10 cm TAPSE (M-mode): 2.9 cm LEFT ATRIUM              Index        RIGHT ATRIUM           Index LA diam:        4.80 cm  2.36 cm/m   RA Area:     21.00 cm LA Vol (A2C):   111.0 ml 54.54 ml/m  RA Volume:   61.20 ml  30.07 ml/m LA Vol (A4C):   66.7 ml  32.78 ml/m LA Biplane Vol: 89.2 ml  43.83 ml/m  AORTIC VALVE AV Area (Vmax):    1.35 cm AV Area (Vmean):   1.36 cm AV Area (VTI):     1.46 cm AV Vmax:           302.00 cm/s AV Vmean:          200.000 cm/s AV VTI:            0.776 m AV Peak Grad:      36.5 mmHg AV Mean Grad:      19.7 mmHg LVOT Vmax:         107.00 cm/s LVOT Vmean:        71.800 cm/s LVOT VTI:          0.298 m LVOT/AV VTI ratio: 0.38  AORTA Ao Root diam: 3.20 cm Ao Asc diam:  3.50 cm MITRAL VALVE                TRICUSPID VALVE MV Area (PHT): 3.17 cm     TR Peak grad:   28.1 mmHg MV Decel Time: 239 msec     TR Vmax:        265.00 cm/s MV E velocity: 147.00 cm/s MV A velocity: 72.10 cm/s   SHUNTS MV E/A ratio:  2.04         Systemic VTI:  0.30 m                             Systemic Diam: 2.20 cm Redell Shallow MD Electronically signed by Redell Shallow MD Signature Date/Time: 05/16/2024/8:35:30 AM    Final    DG Chest 2 View Result Date: 05/15/2024 CLINICAL DATA:  Shortness of breath EXAM: CHEST -  2 VIEW COMPARISON:  03/03/2024 FINDINGS: Mild cardiomegaly. Small bilateral pleural effusions. Vascular congestion and probable mild interstitial edema. Possible left retrocardiac airspace opacity. Multiple chronic appearing bilateral rib fractures. IMPRESSION: Cardiomegaly with vascular congestion and probable mild interstitial edema. Small bilateral pleural effusions. Possible left  retrocardiac airspace disease. Electronically Signed   By: Luke Bun M.D.   On: 05/15/2024 17:02   XR FEMUR, MIN 2 VIEWS RIGHT Result Date: 05/07/2024 XRs of the right femur from 05/08/2023 were independently reviewed and interpreted, showing a minimally displaced intertrochanteric femur fracture.  No significant callus formation seen.  Alignment unchanged since prior films on 03/22/2024.  No acute fracture seen.  No dislocation seen.  No lucency seen around the interlocking screws or the lag screws.   Labs: BNP (last 3 results) No results for input(s): BNP in the last 8760 hours. Basic Metabolic Panel: Recent Labs  Lab 05/15/24 1617 05/16/24 0245 05/17/24 0445 05/18/24 0357  NA 143 141 139 137  K 3.4* 3.8 3.5 3.4*  CL 110 108 104 102  CO2 22 24 24 24   GLUCOSE 95 117* 102* 104*  BUN 20 21 29* 29*  CREATININE 1.19 1.28* 1.59* 1.14  CALCIUM  8.3* 8.5* 8.1* 8.4*  MG  --   --  1.8  --    Liver Function Tests: Recent Labs  Lab 05/15/24 1617  AST 15  ALT 17  ALKPHOS 99  BILITOT 1.3*  PROT 6.0*  ALBUMIN  3.6   No results for input(s): LIPASE, AMYLASE in the last 168 hours. No results for input(s): AMMONIA in the last 168 hours. CBC: Recent Labs  Lab 05/15/24 1617 05/16/24 0245  WBC 9.8 8.6  NEUTROABS 7.6  --   HGB 8.6* 9.2*  HCT 28.5* 29.2*  MCV 97.9 96.7  PLT 203 209   CBG: Recent Labs  Lab 05/17/24 2158  GLUCAP 115*   Hgb A1c No results for input(s): HGBA1C in the last 72 hours. Anemia work up No results for input(s): VITAMINB12, FOLATE, FERRITIN, TIBC, IRON, RETICCTPCT in the last 72 hours. Cardiac Enzymes: No results for input(s): CKTOTAL, CKMB, CKMBINDEX, TROPONINI in the last 168 hours. BNP: Invalid input(s): POCBNP D-Dimer No results for input(s): DDIMER in the last 72 hours. Lipid Profile No results for input(s): CHOL, HDL, LDLCALC, TRIG, CHOLHDL, LDLDIRECT in the last 72 hours. Thyroid  function  studies Recent Labs    05/17/24 0445  TSH 2.240   Urinalysis    Component Value Date/Time   COLORURINE YELLOW 04/15/2024 1100   APPEARANCEUR CLEAR 04/15/2024 1100   LABSPEC 1.022 04/15/2024 1100   PHURINE 5.0 04/15/2024 1100   GLUCOSEU NEGATIVE 04/15/2024 1100   HGBUR NEGATIVE 04/15/2024 1100   BILIRUBINUR NEGATIVE 04/15/2024 1100   KETONESUR NEGATIVE 04/15/2024 1100   PROTEINUR 30 (A) 04/15/2024 1100   UROBILINOGEN 4.0 (H) 03/03/2014 1257   NITRITE NEGATIVE 04/15/2024 1100   LEUKOCYTESUR NEGATIVE 04/15/2024 1100   Sepsis Labs Recent Labs  Lab 05/15/24 1617 05/16/24 0245  WBC 9.8 8.6   Microbiology Recent Results (from the past 240 hours)  Resp panel by RT-PCR (RSV, Flu A&B, Covid) Anterior Nasal Swab     Status: None   Collection Time: 05/15/24  4:17 PM   Specimen: Anterior Nasal Swab  Result Value Ref Range Status   SARS Coronavirus 2 by RT PCR NEGATIVE NEGATIVE Final   Influenza A by PCR NEGATIVE NEGATIVE Final   Influenza B by PCR NEGATIVE NEGATIVE Final    Comment: (NOTE) The Xpert Xpress SARS-CoV-2/FLU/RSV plus assay  is intended as an aid in the diagnosis of influenza from Nasopharyngeal swab specimens and should not be used as a sole basis for treatment. Nasal washings and aspirates are unacceptable for Xpert Xpress SARS-CoV-2/FLU/RSV testing.  Fact Sheet for Patients: bloggercourse.com  Fact Sheet for Healthcare Providers: seriousbroker.it  This test is not yet approved or cleared by the [place]  FDA and has been authorized for detection and/or diagnosis of SARS-CoV-2 by FDA under an Emergency Use Authorization (EUA). This EUA will remain in effect (meaning this test can be used) for the duration of the COVID-19 declaration under Section 564(b)(1) of the Act, 21 U.S.C. section 360bbb-3(b)(1), unless the authorization is terminated or revoked.     Resp Syncytial Virus by PCR NEGATIVE NEGATIVE  Final    Comment: (NOTE) Fact Sheet for Patients: bloggercourse.com  Fact Sheet for Healthcare Providers: seriousbroker.it  This test is not yet approved or cleared by the [place]  FDA and has been authorized for detection and/or diagnosis of SARS-CoV-2 by FDA under an Emergency Use Authorization (EUA). This EUA will remain in effect (meaning this test can be used) for the duration of the COVID-19 declaration under Section 564(b)(1) of the Act, 21 U.S.C. section 360bbb-3(b)(1), unless the authorization is terminated or revoked.  Performed at [facility], [address] [place], [place] [address]      Time coordinating discharge: 35 minutes  SIGNED: Mennie LAMY, MD  [facility] 05/18/2024, 10:09 AM  If 7PM-7AM, please contact night-coverage [email or web address]       [1]  Allergies Allergen Reactions   Adhesive [Tape] Dermatitis and Other (See Comments)    Skin blisters after tape removal  OK with using paper tape   Amoxicillin Hives   Calan Sr [Verapamil] Cough   Cardura [Doxazosin Mesylate] Other (See Comments)    Unknown reaction   Celebrex [Celecoxib] Nausea And Vomiting   Cephalosporins Hives   Clavulanic Acid Other (See Comments)   Clindamycin/Lincomycin Hives   Dexon [Dexamethasone  Sodium Phosphate ] Other (See Comments)    Unknown reaction   Duraprep [Antiseptic Products, Misc.] Dermatitis and Other (See Comments)    Localized skin blisters around application site when applied topically   Ery-Tab [Erythromycin] Hives   Hctz [Hydrochlorothiazide ] Other (See Comments)    Photosensitivity  Burning sensation    Iodine Dermatitis and Other (See Comments)    Localized skin blisters around application site when applied topically   Keflex [Cephalexin] Diarrhea and Nausea And Vomiting    body rejects it   Lipitor [Atorvastatin ]    Norvasc  [Amlodipine ] Other (See Comments)    Unknown reaction    Tenormin  [Atenolol ] Other (See Comments)    Unknown reaction   Toprol  Xl [Metoprolol  Succinate] Other (See Comments)    Unknown reaction   Vasotec [Enalapril] Cough   Vioxx [Rofecoxib] Hives and Nausea And Vomiting   Zocor [Simvastatin] Nausea And Vomiting   Latex Rash

## 2024-05-18 NOTE — Progress Notes (Signed)
 Physical Therapy Treatment Patient Details Name: [name] [name] MRN: [id number] DOB: [birth year] Today's Date: 05/18/2024   History of Present Illness Pt is 74 year old presented to [facility] on  05/15/24 for SOB and LE edema. Pt with acute on chronic diastolic HF. 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 Worked on seated and standing balance with pt don/doffing clothes for d/c home. Pt was slightly unsteady when standing from EOB with CGA/MinA required. Discussed having his caregiver close by when standing and ambulating due to unsteadiness with pt verbalizing agreement. Pt was then able to ambulate to/from the bathroom with CGA and RW. TotalA needed for posterior pericare in standing with further gait limited due to fatigue. Continue to recommend HHPT with acute PT to follow.    If plan is discharge home, recommend the following: A little help with walking and/or transfers;A little help with bathing/dressing/bathroom;Assist for transportation   Can travel by private vehicle      Yes  Equipment Recommendations  None recommended by PT       Precautions / Restrictions Precautions Precautions: Fall Recall of Precautions/Restrictions: Intact Restrictions Weight Bearing Restrictions Per Provider Order: No     Mobility  Bed Mobility Overal bed mobility: Needs Assistance Bed Mobility: Supine to Sit, Sit to Supine    Supine to sit: Min assist, HOB elevated Sit to supine: Supervision   General bed mobility comments: MinA via 1HH for assist with trunk raise. Supervision to return to supine    Transfers Overall transfer level: Needs assistance Equipment used: Rolling walker (2 wheels) Transfers: Sit to/from Stand Sit to Stand: Contact guard assist, Min assist     General transfer comment: MinA from lower toilet height. CGA/MinA from EOB for slight steadying assist as pt was unsteady    Ambulation/Gait Ambulation/Gait assistance: Contact guard assist Gait Distance (Feet): 30 Feet Assistive device: Rolling walker (2 wheels) Gait Pattern/deviations: Step-through pattern, Decreased step length - right, Decreased step length - left, Decreased stride length, Trunk flexed Gait velocity: decr    General Gait Details: Effortful gait pattern with reliance on UE support. Distance limited by fatigue      Balance Overall balance assessment: Needs assistance Sitting-balance support: Feet supported Sitting balance-Leahy Scale: Good    Standing balance support: Bilateral upper extremity supported, During functional activity Standing balance-Leahy Scale: Fair Standing balance comment: able to stand statically with no UE support to don pants. RW for gait       Communication Communication Communication: Impaired Factors Affecting Communication: Hearing impaired  Cognition Arousal: Alert Behavior During Therapy: WFL for tasks assessed/performed   PT - Cognitive impairments: No apparent impairments    Following commands: Impaired Following commands impaired: Follows multi-step commands with increased time    Cueing Cueing Techniques: Verbal cues     General Comments General comments (skin integrity, edema, etc.): VSS on RA      Pertinent Vitals/Pain Pain Assessment Pain Assessment: Faces Faces Pain Scale: Hurts little more Pain Location: bil knees - chronic Pain Descriptors / Indicators: Aching Pain Intervention(s): Limited activity within patient's tolerance, Monitored during session, Repositioned     PT Goals (current goals can now be found in the care plan section) Acute Rehab PT Goals PT Goal Formulation: With patient Time For Goal  Achievement: 05/30/24 Potential to Achieve Goals: Good Progress towards PT goals: Progressing toward  goals    Frequency    Min 2X/week       AM-PAC PT 6 Clicks Mobility   Outcome Measure  Help needed turning from your back to your side while in a flat bed without using bedrails?: None Help needed moving from lying on your back to sitting on the side of a flat bed without using bedrails?: A Little Help needed moving to and from a bed to a chair (including a wheelchair)?: A Little Help needed standing up from a chair using your arms (e.g., wheelchair or bedside chair)?: A Little Help needed to walk in hospital room?: A Little Help needed climbing 3-5 steps with a railing? : A Little 6 Click Score: 19    End of Session   Activity Tolerance: Patient tolerated treatment well Patient left: in bed;with bed alarm set;with call bell/phone within reach Nurse Communication: Mobility status PT Visit Diagnosis: Other abnormalities of gait and mobility (R26.89);Muscle weakness (generalized) (M62.81);Difficulty in walking, not elsewhere classified (R26.2);Pain Pain - Right/Left:  (Bilateral) Pain - part of body: Knee     Time: 0900-0919 PT Time Calculation (min) (ACUTE ONLY): 19 min  Charges:    $Therapeutic Activity: 8-22 mins PT General Charges $$ ACUTE PT VISIT: 1 Visit                    Kate ORN, PT, DPT Secure Chat Preferred  Rehab Office [phone]    Kate BRAVO [name] 05/18/2024, 9:42 AM

## 2024-05-18 NOTE — Progress Notes (Addendum)
 Discharge Nurse Summary: DC order noted per MD. DC RN at bedside with patient. Patient agreeable with discharge plan, states family will arrive soon for pickup. Confirmed need for home health setup, informed CM Erminio. Home health setup complete. AVS printed/reviewed. PIV removed, skin intact. No DME needs. No home meds. TOC meds delivered to the patient. CP/Edu resolved. Telemonitor returned to charging station. All belongings accounted for.    Called brother Arley [phone], who states he will confirm when he is headed on the way to pick up the patient. Currrently, unable to confirm a time. Patient without a cell phone. Arley instructed to call this nurse back when he is able to confirm time of arrival before transporting the patient to the lounge. Primary/Charge RN informed.   Rosario EMERSON Lund, RN

## 2024-05-18 NOTE — Plan of Care (Signed)
" °  Problem: Education: Goal: Knowledge of General Education information will improve Description: Including pain rating scale, medication(s)/side effects and non-pharmacologic comfort measures Outcome: Progressing   Problem: Health Behavior/Discharge Planning: Goal: Ability to manage health-related needs will improve Outcome: Progressing   Problem: Clinical Measurements: Goal: Respiratory complications will improve Outcome: Progressing Goal: Cardiovascular complication will be avoided Outcome: Progressing   Problem: Cardiac: Goal: Ability to achieve and maintain adequate cardiopulmonary perfusion will improve Outcome: Progressing   "

## 2024-05-18 NOTE — TOC Initial Note (Signed)
 Transition of Care [facility]) - Initial/Assessment Note    Patient Details  Name: [name] [name] MRN: [id number] Date of Birth: Jan 30, 1951  Transition of Care [facility]) CM/SW Contact:    Sudie Erminio Deems, RN Phone Number: 05/18/2024, 10:04 AM  Clinical Narrative: Risk for readmission assessment completed. Patient presented for shortness of breath. PTA patient states he is from home alone. Patient is currently active with [facility] for PT/OT-ICM added RN since patient is a risk for readmission and he will benefit from disease management. [facility] is aware that the patient will discharge today. Patient has a PCP and has no issues with transportation. Patient states he receives personal care services-unable to recall the agency that comes in the home. Patient's brother to provide transportation home. No further needs identified at this time.                Expected Discharge Plan: Home w Home Health Services Barriers to Discharge: No Barriers Identified   Patient Goals and CMS Choice Patient states their goals for this hospitalization and ongoing recovery are:: Plan to return home alone with [facility].   Choice offered to / list presented to : Patient      Expected Discharge Plan and Services   Discharge Planning Services: CM Consult Post Acute Care Choice: Home Health, Resumption of Svcs/PTA Provider Living arrangements for the past 2 months: Single Family Home Expected Discharge Date: 05/18/24                         HH Arranged: RN, PT, OT [facility] Agency: [facility] Date HH Agency Contacted: 05/18/24 Time HH Agency Contacted: 1003 Representative spoke with at [facility] Agency: [facility]  Prior Living Arrangements/Services Living arrangements for the past 2 months: Single Family Home Lives with:: Self Patient language and need for interpreter reviewed:: Yes Do you feel safe going back to the place where you live?: Yes      Need for Family Participation  in Patient Care: Yes (Comment) Care giver support system in place?:  (Patient states he has personal care services in the home.)   Criminal Activity/Legal Involvement Pertinent to Current Situation/Hospitalization: No - Comment as needed  Activities of Daily Living      Permission Sought/Granted Permission sought to share information with : Case Manager, Family Supports, Magazine Features Editor Permission granted to share information with : Yes, Verbal Permission Granted     Permission granted to share info w AGENCY: [facility]        Emotional Assessment Appearance:: Appears stated age Attitude/Demeanor/Rapport: Engaged Affect (typically observed): Appropriate Orientation: : Oriented to Self Alcohol  / Substance Use: Not Applicable Psych Involvement: No (comment)  Admission diagnosis:  Acute on chronic diastolic (congestive) heart failure (HCC) [I50.33] Acute on chronic congestive heart failure, unspecified heart failure type (HCC) [I50.9] Patient Active Problem List   Diagnosis Date Noted   Sinus bradycardia 05/16/2024   Acute on chronic diastolic (congestive) heart failure (HCC) 05/15/2024   Resistant hypertension 04/18/2024   Medication nonadherence due to intolerance 04/18/2024   Paroxysmal A-fib (HCC) 04/18/2024   AKI (acute kidney injury) 04/15/2024   Bilateral lower extremity edema 04/15/2024   PAF (paroxysmal atrial fibrillation) (HCC) 04/15/2024   Atrial arrhythmia 03/08/2024   Displaced intertrochanteric fracture of right femur, initial encounter for closed fracture (HCC) 03/04/2024   Moderate aortic valve stenosis 03/04/2024   History of stroke 03/03/2024   Nonrheumatic aortic (valve) stenosis 09/21/2023   Pure hypercholesterolemia 09/21/2023  Hypertensive heart disease 08/02/2022   Pre-op evaluation 08/02/2022   Heart failure, type unknown (HCC) 08/02/2022   LVH (left ventricular hypertrophy) 08/02/2022   Systolic murmur 08/02/2022   GERD  (gastroesophageal reflux disease) 06/30/2018   Hypertensive urgency 06/30/2018   Trimalleolar fracture of left ankle 03/08/2014   Dysphagia, oropharyngeal phase 03/08/2014   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) 03/05/2014   Acute delirium 02/24/2014   Blunt chest trauma 02/09/2014   Fracture of lumbar spine (HCC) 02/09/2014   Multiple rib fractures involving four or more ribs 02/09/2014   Traumatic pneumothorax 02/09/2014   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   History of osteopenia 10/13/2012   Compression fracture of L1 lumbar vertebra (HCC) 10/13/2012   Alcohol  abuse 10/13/2012   Esophageal varices (HCC) 01/17/2012   History of colonic polyps 10/29/2011   Cirrhosis (HCC) 10/29/2011   Nonspecific abnormal finding in stool contents 07/26/2011   Lower extremity weakness 07/21/2011   Normocytic anemia 07/21/2011   Abnormal LFTs 07/21/2011   Heart failure with improved ejection fraction (HFimpEF) (HCC) 07/21/2011   Moderate protein-calorie malnutrition 07/21/2011   PCP:  Loring Tanda Mae, MD Pharmacy:   [id number] - [place], [place] - [address] [place] [address] Phone: [phone] Fax: [phone]  [facility] [address] [place] [address] Phone: [phone] Fax: [phone]     Social Drivers of Health (SDOH) Social History: SDOH Screenings   Food Insecurity: No Food Insecurity (05/16/2024)  Housing: Low Risk (05/16/2024)  Transportation Needs: No Transportation Needs (05/18/2024)  Utilities: Not At Risk (05/16/2024)  Social Connections: Patient Declined (05/18/2024)  Tobacco Use: Low Risk (05/07/2024)   SDOH Interventions:     Readmission Risk Interventions    05/18/2024   10:04 AM  Readmission Risk Prevention Plan  Transportation Screening Complete  HRI or Home Care Consult Complete  Social Work  Consult for Recovery Care Planning/Counseling Complete  Palliative Care Screening Not Applicable  Medication Review Oceanographer) Referral to Pharmacy

## 2024-05-23 ENCOUNTER — Ambulatory Visit: Payer: Self-pay

## 2024-05-23 LAB — LAB REPORT - SCANNED: EGFR: 62

## 2024-06-07 NOTE — Assessment & Plan Note (Signed)
 Patient was recently admitted with acute CHF.***

## 2024-06-07 NOTE — Assessment & Plan Note (Signed)
 He was noted to be in atrial fibrillation/flutter during an admission for stroke in November 2025.  He was placed on amiodarone  and rivaroxaban .  During his most recent admission for heart failure, Nebivolol  was stopped due to bradycardia.***

## 2024-06-07 NOTE — Assessment & Plan Note (Signed)
 Blood pressure has been difficult to control.***

## 2024-06-07 NOTE — Assessment & Plan Note (Signed)
 Moderate aortic stenosis with mean gradient 19.7 mmHg by echocardiogram January 2026.***

## 2024-06-07 NOTE — Progress Notes (Unsigned)
 " OFFICE NOTE:    Date:  06/08/2024  ID:  [name] [name], DOB Sep 29, 1950, MRN [id number] PCP: Loring Tanda Mae, MD  [facility] Providers Cardiologist:  Stanly DELENA Leavens, MD        (HFpEF) heart failure with preserved ejection fraction  Lexiscan  MPI 08/10/22: no ischemia, low risk  Aortic stenosis TTE 08/27/22: mod AS (mean 19.6 mmHg, Vmax 292.4 cm/s, DI 0.47) TTE 08/09/23: EF 60-65, no RWMA, mod LVH, Gr 1 DD, NL RVSF, mod LAE, mild MR, tricuspid AV, mild AI, mod AS (mean 20.5 mmHg, Vmax 306.5 cm/s, DI 0.43) TTE 03/04/2024: EF >75, mild LVH, GR 1 DD, normal RVSF TTE 05/16/2024: EF 60-65, no RWMA, mild LVH, GRII DD, normal RVSF, mild LAE, mild AI, moderate AS (mean gradient 19.7, V-max 302 cm/s, DI 0.38) Atrial fibrillation/Flutter  ATach noted as well  Amiodarone  Rx Sinus bradycardia  Peripheral arterial disease  ABIs 06/16/23: R 0.96, L 0.88 S/p CVA 03/2024 Tx w TNK; AFlutter diagnosed S/p fall >> R femur fx Hypertension  Hyperlipidemia  Intol of statins Alcoholic cirrhosis   ABIs 04/04/2024: Normal        Discussed the use of AI scribe software for clinical note transcription with the patient, who gave verbal consent to proceed. History of Present Illness [name] [name] is a 74 y.o. male for post hospital follow up.   He was admitted in November 2025 with acute CVA treated with TNK.  He had suffered a fall in the setting of his stroke and suffered right hip fracture.  He underwent repair.  He developed atrial flutter and was placed on amiodarone .  He had follow up with Dr. Leavens 05/14/24. BP was uncontrolled. There were concerns about med adherence. His hydralazine  was increased.   He was admitted 1/13-1/16 with acute CHF.  Repeat echocardiogram demonstrated normal EF, moderate aortic stenosis.  He was treated with IV furosemide  with improved clinical picture.  Bystolic  was held secondary to bradycardia.  He is here with his brother and sister-in-law.  He now  has caregivers that monitor his weight and blood pressure daily. His weight decreased from 200 pounds at discharge to 190 pounds. No current leg swelling, chest discomfort, pain, or pressure, and his breathing is stable. No dizziness or presyncope. No residual weakness from his stroke, but he has decreased mobility issues which are attributed to arthritis in his knees. He uses a walker and requires assistance for walking.  Blood pressure at home was reviewed today.  Most of his readings are optimal.     ROS-See HPI    Studies Reviewed:  EKG Interpretation Date/Time:  Friday June 08 2024 08:48:34 EST Ventricular Rate:  58 PR Interval:  198 QRS Duration:  126 QT Interval:  480 QTC Calculation: 471 R Axis:   -30  Text Interpretation: Sinus bradycardia Left axis deviation Non-specific intra-ventricular conduction delay No significant change since last tracing Confirmed by Lelon Hamilton [phone]) on 06/08/2024 9:17:14 AM    LABS 03/19/2024: Direct LDL 58 05/15/2024: ALT 17 05/15/2024: Troponin T 43, 44; proBNP 3810 05/16/2024: Hgb 9.2, PLT 209K, TSH 2.24 05/18/2024: K 3.4, creatinine 1.14, eGFR >60 05/21/2024: Hgb 9.5, PLT 233K, creatinine 1.23, eGFR 62, potassium 5.2   Risk Assessment/Calculations: CHA2DS2-VASc Score = 6   This indicates a 9.7% annual risk of stroke. The patient's score is based upon: CHF History: 1 HTN History: 1 Diabetes History: 0 Stroke History: 2 Vascular Disease History: 1 Age Score: 1 Gender Score: 0  Physical Exam:  VS:  BP (!) 114/52 (BP Location: Left Arm, Patient Position: Sitting, Cuff Size: Normal)   Pulse 68   Resp 16   Ht 5' 7 (1.702 m)   Wt 190 lb (86.2 kg)   SpO2 93%   BMI 29.76 kg/m        Wt Readings from Last 3 Encounters:  06/08/24 190 lb (86.2 kg)  05/18/24 194 lb 10.7 oz (88.3 kg)  05/14/24 203 lb (92.1 kg)    Constitutional:      Appearance: Not in distress. Chronically ill-appearing.  Neck:     Vascular: No JVR.   Pulmonary:     Breath sounds: Normal breath sounds. No wheezing. No rales.  Cardiovascular:     Bradycardia present. Regular rhythm.     Murmurs: There is a grade 3/6 systolic murmur at the URSB.  Edema:    Peripheral edema present.    Pretibial: bilateral trace edema of the pretibial area. Abdominal:     Palpations: Abdomen is soft.       Assessment and Plan:    Assessment & Plan Chronic heart failure with preserved ejection fraction (HFpEF) (HCC) Patient was recently admitted with acute CHF. Overall, he is improved.  No further edema or shortness of breath.  Volume status is stable.  NYHA IIb-III.  Mobility is mainly limited by knee pain.  Recent potassium was borderline elevated. - Continue furosemide  20 mg daily, K 20 mEq daily. - Monitor weight daily and report any weight gain of more than 3 pounds in a day. - Order repeat BMET today - Maintain low-salt diet. - Follow up in about 3 months with Dr. Arnetha or me.  PAF (paroxysmal atrial fibrillation) (HCC) He was noted to be in atrial fibrillation/flutter during an admission for stroke in November 2025.  He was placed on amiodarone  and rivaroxaban .  During his most recent admission for heart failure, Nebivolol  was stopped due to bradycardia.  He is currently maintaining sinus rhythm.  Recent TSH and ALT normal. - Continue amiodarone  100 mg daily - Continue Xarelto  20 mg daily - Follow-up BMET today  Nonrheumatic aortic (valve) stenosis Moderate aortic stenosis with mean gradient 19.7 mmHg by echocardiogram January 2026. - Continue annual surveillance Hypertensive heart disease with chronic diastolic congestive heart failure (HCC) Blood pressure has been difficult to control.  Blood pressure is now optimal. -Continue isosorbide  dinitrate 30 mg twice daily, hydralazine  100 mg twice daily, losartan  25 mg daily Normocytic anemia Recent hemoglobin stable. Hyperlipidemia LDL goal <70 Recent LDL optimal. -Continue Lipitor  40 mg daily, Zetia  10 mg daily      Dispo:  Return in about 3 months (around 09/05/2024) for Routine Follow Up, w/ Dr. Santo, or Glendia Ferrier, PA-C.  Signed, Glendia Ferrier, PA-C   "

## 2024-06-08 ENCOUNTER — Other Ambulatory Visit (HOSPITAL_COMMUNITY): Payer: Self-pay

## 2024-06-08 ENCOUNTER — Ambulatory Visit: Admitting: Physician Assistant

## 2024-06-08 ENCOUNTER — Encounter: Payer: Self-pay | Admitting: Physician Assistant

## 2024-06-08 VITALS — BP 114/52 | HR 68 | Resp 16 | Ht 67.0 in | Wt 190.0 lb

## 2024-06-08 DIAGNOSIS — I480 Paroxysmal atrial fibrillation: Secondary | ICD-10-CM

## 2024-06-08 DIAGNOSIS — I110 Hypertensive heart disease with heart failure: Secondary | ICD-10-CM

## 2024-06-08 DIAGNOSIS — I5032 Chronic diastolic (congestive) heart failure: Secondary | ICD-10-CM

## 2024-06-08 DIAGNOSIS — I350 Nonrheumatic aortic (valve) stenosis: Secondary | ICD-10-CM

## 2024-06-08 DIAGNOSIS — D649 Anemia, unspecified: Secondary | ICD-10-CM

## 2024-06-08 DIAGNOSIS — E785 Hyperlipidemia, unspecified: Secondary | ICD-10-CM

## 2024-06-08 LAB — BASIC METABOLIC PANEL WITH GFR
BUN/Creatinine Ratio: 24 (ref 10–24)
BUN: 33 mg/dL — ABNORMAL HIGH (ref 8–27)
CO2: 16 mmol/L — ABNORMAL LOW (ref 20–29)
Calcium: 8.8 mg/dL (ref 8.6–10.2)
Chloride: 104 mmol/L (ref 96–106)
Creatinine, Ser: 1.37 mg/dL — ABNORMAL HIGH (ref 0.76–1.27)
Glucose: 82 mg/dL (ref 70–99)
Potassium: 5.1 mmol/L (ref 3.5–5.2)
Sodium: 136 mmol/L (ref 134–144)
eGFR: 54 mL/min/{1.73_m2} — ABNORMAL LOW

## 2024-06-08 MED ORDER — FUROSEMIDE 20 MG PO TABS
20.0000 mg | ORAL_TABLET | Freq: Every day | ORAL | 3 refills | Status: AC
  Filled 2024-06-08: qty 90, 90d supply, fill #0

## 2024-06-08 MED ORDER — POTASSIUM CHLORIDE CRYS ER 20 MEQ PO TBCR
20.0000 meq | EXTENDED_RELEASE_TABLET | Freq: Every day | ORAL | 3 refills | Status: AC
  Filled 2024-06-08: qty 90, 90d supply, fill #0

## 2024-06-08 MED ORDER — FUROSEMIDE 20 MG PO TABS: 20.0000 mg | ORAL_TABLET | Freq: Every day | ORAL | 3 refills | Status: DC

## 2024-06-08 MED ORDER — POTASSIUM CHLORIDE CRYS ER 20 MEQ PO TBCR: 20.0000 meq | EXTENDED_RELEASE_TABLET | Freq: Every day | ORAL | 3 refills | Status: DC

## 2024-06-08 NOTE — Assessment & Plan Note (Signed)
 Recent hemoglobin stable.

## 2024-06-08 NOTE — Patient Instructions (Addendum)
 Medication Instructions:   Medications ordered in this encounter:   furosemide  (LASIX ) 20 MG tablet    Sig: Take 1 tablet (20 mg total) by mouth daily.    Dispense:  90 tablet    Refill:  3    Supervising Provider:   CRENSHAW, BRIAN S [[id number]]   potassium chloride  SA (KLOR-CON  M) 20 MEQ tablet    Sig: Take 1 tablet (20 mEq total) by mouth daily.    Dispense:  90 tablet    Refill:  3    Supervising Provider:   PIETRO REDELL RAMAN [[id number]]    *If you need a refill on your cardiac medications before your next appointment, please call your pharmacy*  Lab Work: (TODAY) Lab Orders         Basic metabolic panel with GFR     If you have labs (blood work) drawn today and your tests are completely normal, you will receive your results only by: MyChart Message (if you have MyChart) OR A paper copy in the mail If you have any lab test that is abnormal or we need to change your treatment, we will call you to review the results.   Follow-Up: At [facility], you and your health needs are our priority.  As part of our continuing mission to provide you with exceptional heart care, our providers are all part of one team.  This team includes your primary Cardiologist (physician) and Advanced Practice Providers or APPs (Physician Assistants and Nurse Practitioners) who all work together to provide you with the care you need, when you need it.  Your next appointment:   3 month(s)  Provider:   Stanly DELENA Leavens, MD or Glendia Ferrier, PA-C   We recommend signing up for the patient portal called MyChart.  Sign up information is provided on this After Visit Summary.  MyChart is used to connect with patients for Virtual Visits (Telemedicine).  Patients are able to view lab/test results, encounter notes, upcoming appointments, etc.  Non-urgent messages can be sent to your provider as well.   To learn more about what you can do with MyChart, go to [email or web address].

## 2024-06-13 ENCOUNTER — Ambulatory Visit: Admitting: Physician Assistant

## 2024-06-18 ENCOUNTER — Ambulatory Visit: Admitting: Orthopedic Surgery

## 2024-09-05 ENCOUNTER — Ambulatory Visit: Admitting: Physician Assistant

## 2025-03-07 ENCOUNTER — Ambulatory Visit: Admitting: Adult Health
